# Patient Record
Sex: Female | Born: 1950 | Race: White | Hispanic: No | Marital: Single | Smoking: Current every day smoker
Health system: Southern US, Community
[De-identification: ages and names within clinical notes are randomized; demographics above are authoritative.]

## PROBLEM LIST (undated history)

## (undated) DIAGNOSIS — Z923 Personal history of irradiation: Secondary | ICD-10-CM

## (undated) DIAGNOSIS — G939 Disorder of brain, unspecified: Secondary | ICD-10-CM

## (undated) DIAGNOSIS — R1310 Dysphagia, unspecified: Secondary | ICD-10-CM

## (undated) DIAGNOSIS — R079 Chest pain, unspecified: Secondary | ICD-10-CM

## (undated) DIAGNOSIS — C3490 Malignant neoplasm of unspecified part of unspecified bronchus or lung: Principal | ICD-10-CM

## (undated) DIAGNOSIS — D649 Anemia, unspecified: Secondary | ICD-10-CM

## (undated) DIAGNOSIS — J449 Chronic obstructive pulmonary disease, unspecified: Secondary | ICD-10-CM

## (undated) DIAGNOSIS — R911 Solitary pulmonary nodule: Secondary | ICD-10-CM

## (undated) DIAGNOSIS — R0602 Shortness of breath: Secondary | ICD-10-CM

## (undated) DIAGNOSIS — C7931 Secondary malignant neoplasm of brain: Secondary | ICD-10-CM

## (undated) DIAGNOSIS — N823 Fistula of vagina to large intestine: Secondary | ICD-10-CM

## (undated) HISTORY — DX: Anemia, unspecified: D64.9

## (undated) HISTORY — PX: EYE SURGERY: SHX253

## (undated) HISTORY — PX: PARTIAL HYSTERECTOMY: SHX80

## (undated) HISTORY — DX: Disorder of brain, unspecified: G93.9

## (undated) HISTORY — DX: Solitary pulmonary nodule: R91.1

## (undated) HISTORY — DX: Fistula of vagina to large intestine: N82.3

## (undated) HISTORY — PX: OTHER SURGICAL HISTORY: SHX169

## (undated) HISTORY — PX: TUBAL LIGATION: SHX77

## (undated) HISTORY — PX: NECK SURGERY: SHX720

## (undated) HISTORY — PX: RETINAL DETACHMENT SURGERY: SHX105

---

## 1986-03-05 HISTORY — PX: ABDOMINAL HYSTERECTOMY: SHX81

## 1999-07-17 ENCOUNTER — Encounter: Admission: RE | Admit: 1999-07-17 | Discharge: 1999-10-15 | Payer: Self-pay | Admitting: Family Medicine

## 1999-08-23 ENCOUNTER — Encounter: Payer: Self-pay | Admitting: Neurosurgery

## 1999-08-23 ENCOUNTER — Ambulatory Visit (HOSPITAL_COMMUNITY): Admission: RE | Admit: 1999-08-23 | Discharge: 1999-08-23 | Payer: Self-pay | Admitting: Neurosurgery

## 1999-09-01 ENCOUNTER — Encounter: Payer: Self-pay | Admitting: Neurosurgery

## 1999-09-01 ENCOUNTER — Observation Stay (HOSPITAL_COMMUNITY): Admission: RE | Admit: 1999-09-01 | Discharge: 1999-09-02 | Payer: Self-pay | Admitting: Neurosurgery

## 1999-09-14 ENCOUNTER — Encounter: Payer: Self-pay | Admitting: Neurosurgery

## 1999-09-14 ENCOUNTER — Encounter: Admission: RE | Admit: 1999-09-14 | Discharge: 1999-09-14 | Payer: Self-pay | Admitting: Neurosurgery

## 1999-10-30 ENCOUNTER — Encounter: Admission: RE | Admit: 1999-10-30 | Discharge: 1999-10-30 | Payer: Self-pay | Admitting: Neurosurgery

## 1999-10-30 ENCOUNTER — Encounter: Payer: Self-pay | Admitting: Neurosurgery

## 2000-09-02 ENCOUNTER — Ambulatory Visit (HOSPITAL_COMMUNITY): Admission: RE | Admit: 2000-09-02 | Discharge: 2000-09-02 | Payer: Self-pay | Admitting: Obstetrics and Gynecology

## 2000-09-02 ENCOUNTER — Encounter: Payer: Self-pay | Admitting: Obstetrics and Gynecology

## 2001-11-28 ENCOUNTER — Ambulatory Visit (HOSPITAL_COMMUNITY): Admission: RE | Admit: 2001-11-28 | Discharge: 2001-11-28 | Payer: Self-pay | Admitting: General Surgery

## 2001-11-28 ENCOUNTER — Encounter: Payer: Self-pay | Admitting: General Surgery

## 2002-07-31 ENCOUNTER — Encounter: Payer: Self-pay | Admitting: Family Medicine

## 2002-07-31 ENCOUNTER — Ambulatory Visit (HOSPITAL_COMMUNITY): Admission: RE | Admit: 2002-07-31 | Discharge: 2002-07-31 | Payer: Self-pay | Admitting: Family Medicine

## 2003-03-08 ENCOUNTER — Ambulatory Visit (HOSPITAL_COMMUNITY): Admission: RE | Admit: 2003-03-08 | Discharge: 2003-03-08 | Payer: Self-pay | Admitting: Family Medicine

## 2004-03-21 ENCOUNTER — Ambulatory Visit (HOSPITAL_COMMUNITY): Admission: RE | Admit: 2004-03-21 | Discharge: 2004-03-21 | Payer: Self-pay | Admitting: Family Medicine

## 2005-04-10 ENCOUNTER — Ambulatory Visit (HOSPITAL_COMMUNITY): Admission: RE | Admit: 2005-04-10 | Discharge: 2005-04-10 | Payer: Self-pay | Admitting: Family Medicine

## 2006-04-29 ENCOUNTER — Ambulatory Visit (HOSPITAL_COMMUNITY): Admission: RE | Admit: 2006-04-29 | Discharge: 2006-04-29 | Payer: Self-pay | Admitting: Family Medicine

## 2007-05-02 ENCOUNTER — Ambulatory Visit (HOSPITAL_COMMUNITY): Admission: RE | Admit: 2007-05-02 | Discharge: 2007-05-02 | Payer: Self-pay | Admitting: Family Medicine

## 2008-05-03 ENCOUNTER — Ambulatory Visit (HOSPITAL_COMMUNITY): Admission: RE | Admit: 2008-05-03 | Discharge: 2008-05-03 | Payer: Self-pay | Admitting: Family Medicine

## 2009-05-19 ENCOUNTER — Ambulatory Visit (HOSPITAL_COMMUNITY): Admission: RE | Admit: 2009-05-19 | Discharge: 2009-05-19 | Payer: Self-pay | Admitting: Family Medicine

## 2010-02-02 ENCOUNTER — Ambulatory Visit (HOSPITAL_COMMUNITY)
Admission: RE | Admit: 2010-02-02 | Discharge: 2010-02-02 | Payer: Self-pay | Source: Home / Self Care | Admitting: Surgery

## 2010-05-16 LAB — CBC
HCT: 40.2 % (ref 36.0–46.0)
MCV: 92.4 fL (ref 78.0–100.0)
Platelets: 294 10*3/uL (ref 150–400)
RBC: 4.35 MIL/uL (ref 3.87–5.11)
RDW: 12.5 % (ref 11.5–15.5)
WBC: 9 10*3/uL (ref 4.0–10.5)

## 2010-05-16 LAB — SURGICAL PCR SCREEN

## 2010-06-12 ENCOUNTER — Other Ambulatory Visit (HOSPITAL_COMMUNITY): Payer: Self-pay | Admitting: Family Medicine

## 2010-06-12 DIAGNOSIS — Z139 Encounter for screening, unspecified: Secondary | ICD-10-CM

## 2010-06-22 ENCOUNTER — Ambulatory Visit (HOSPITAL_COMMUNITY)
Admission: RE | Admit: 2010-06-22 | Discharge: 2010-06-22 | Disposition: A | Discharge status: Home / Self Care | Payer: PRIVATE HEALTH INSURANCE | Source: Ambulatory Visit | Attending: Family Medicine | Admitting: Family Medicine

## 2010-06-22 DIAGNOSIS — Z1231 Encounter for screening mammogram for malignant neoplasm of breast: Secondary | ICD-10-CM | POA: Insufficient documentation

## 2010-06-22 DIAGNOSIS — Z139 Encounter for screening, unspecified: Secondary | ICD-10-CM

## 2010-07-21 NOTE — Op Note (Signed)
[address]. [facility]  Patient:    Melanie Liu, Melanie Liu                        MRN: [id number] Proc. Date: 09/01/99 Adm. 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  PROCEDURE:  Anterior C6-7 diskectomy, bone bank graft, Synthes plate, microscope, Midas-Rex.  SURGEON:  Tanya Nones. Jeral Fruit, M.D.  ASSISTANTMena Goes. Franky Macho, M.D.  INDICATIONS:  The patient was admitted because of neck and left upper extremity  pain associated with atrophy of the left tricep.  MRI showed mild spondylosis at the level of 5-6, but a disk central and to the left at the level of C6-7. Surgery was advised.  She knew all the risks such as infection, CSF leak, worsening of he pain, paralysis, damage to the vocal cords, damage to throat, and failure of the plate.  The patient was scheduled to have surgery last week, but we found that ad the possibility of MI.  Cardiological workup was negative.  DESCRIPTION OF PROCEDURE:  The patient was taken to the OR and after intubation, the left side of the neck was prepped with Betadine.  A transverse incision through the skin and platysma was carried out.  X-ray showed that indeed we were at the  level of the C6-7.  We brought the microscope into the area and we opened anterior lip doing proper gross diskectomy.  Then with the 1 and 2 mm Kerrison punch, we did a foraminotomy.  We opened the posterior ligament and we found at least 3 or 4 fragments of disk going into the left C7 nerve root.  The nerve was swollen and  reddish.  Decompression was achieved.  At the end with the Midas-Rex we drilled the endplates of C6-7 and a 6 mm bone graft was inserted.  Then a Synthes  plate using four screws was inserted.  Inspection of the trachea and carotid was negative.  Then the area was irrigated and lateral C-spine showed good position of the graft and the plate and the wound was closed with Vicryl and a Steri-Strip. The patient did well. DD:  09/01/99 TD:  09/02/99 Job: [id number]

## 2010-07-21 NOTE — H&P (Signed)
[place]. [facility]  Patient:    Melanie Liu, Melanie Liu                          MRN: [id number] Adm. Date:  09/01/99 Attending:  Tanya Nones. Jeral Fruit, M.D.                         History and Physical  HISTORY OF PRESENT ILLNESS:  Melanie Liu is a lady who was seen by me ten days ago in my office because of neck pain with radiation to the left upper extremity associated with numbness and tingling sensation.  The patient had failed with conservative treatment and the MRI showed a large herniated disc at the level of C6-C7.  We were ready to take this lady to surgery last week but the EKG showed the possibility of an MI.  She had a cardiology evaluation which was negative and she is being admitted today for surgery.  PAST MEDICAL HISTORY:  Hysterectomy.  ALLERGIES:  She is not allergic to any medications.  She smokes a pack a day and she had been doing that for 13 years.  Does not drink.  FAMILY HISTORY:  Father died at the age of 78 with a heart attack.  REVIEW OF SYSTEMS:  Except for the heavy history of smoking and neck and left upper extremity pain, the rest is negative.  PHYSICAL EXAMINATION:  GENERAL:  A patient who came to my office and she was holding the left arm against the chest wall.  HEENT:  Normal.  NECK:  There are no bruits. She is able to flex but extension causes pain going to the left shoulder.  LUNGS:  Rhonchi bilaterally.  HEART:  Sounds normal.  ABDOMEN:  Normal.  EXTREMITIES: Normal pulses.  NEUROLOGIC:  Mental status is normal.  Cranial nerves normal. Strength: On the left side she has a normal biceps, deltoid and normal wrist. She has 2/5 weakness of the left triceps with atrophy. She also has weakness of the hypothenar muscle.  Sensation seems to be normal, although she is complaining of tingling sensation which involved the 6 and 7 dermatome. Reflexes are 2+ with absence of the left triceps.  MRI showed that she has mild  degenerative changes at the level of 5-6 and 6-7. The MRI showed that she has a large herniated disc central and to the left at the level of C6 and 7.  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 Mild cervical spondylosis 5-6.  RECOMMENDATIONS:  The patient is being admitted for one level anterior cervical diskectomy.  She knows about the risks such as infection, CSF leak, worsening or pain, paralysis, damage to vocal cords, damage to the esophagus, infection and failure of the system. DD:  09/01/99 TD:  09/01/99 Job: [id number]

## 2011-03-21 ENCOUNTER — Encounter (INDEPENDENT_AMBULATORY_CARE_PROVIDER_SITE_OTHER): Payer: PRIVATE HEALTH INSURANCE | Admitting: Ophthalmology

## 2011-03-21 DIAGNOSIS — H353290 Exudative age-related macular degeneration, unspecified eye, stage unspecified: Secondary | ICD-10-CM

## 2011-03-21 DIAGNOSIS — H5210 Myopia, unspecified eye: Secondary | ICD-10-CM

## 2011-03-21 DIAGNOSIS — H43819 Vitreous degeneration, unspecified eye: Secondary | ICD-10-CM

## 2011-03-21 DIAGNOSIS — H3530 Unspecified macular degeneration: Secondary | ICD-10-CM

## 2011-03-30 ENCOUNTER — Ambulatory Visit (INDEPENDENT_AMBULATORY_CARE_PROVIDER_SITE_OTHER): Payer: PRIVATE HEALTH INSURANCE | Admitting: Ophthalmology

## 2011-03-30 DIAGNOSIS — H4420 Degenerative myopia, unspecified eye: Secondary | ICD-10-CM

## 2011-03-30 DIAGNOSIS — H3530 Unspecified macular degeneration: Secondary | ICD-10-CM

## 2011-03-30 DIAGNOSIS — H43819 Vitreous degeneration, unspecified eye: Secondary | ICD-10-CM

## 2011-03-30 DIAGNOSIS — H353290 Exudative age-related macular degeneration, unspecified eye, stage unspecified: Secondary | ICD-10-CM

## 2011-03-30 DIAGNOSIS — H2510 Age-related nuclear cataract, unspecified eye: Secondary | ICD-10-CM

## 2011-04-18 ENCOUNTER — Encounter (INDEPENDENT_AMBULATORY_CARE_PROVIDER_SITE_OTHER): Payer: PRIVATE HEALTH INSURANCE | Admitting: Ophthalmology

## 2011-04-18 DIAGNOSIS — H43819 Vitreous degeneration, unspecified eye: Secondary | ICD-10-CM

## 2011-04-18 DIAGNOSIS — H2510 Age-related nuclear cataract, unspecified eye: Secondary | ICD-10-CM

## 2011-04-18 DIAGNOSIS — H353290 Exudative age-related macular degeneration, unspecified eye, stage unspecified: Secondary | ICD-10-CM

## 2011-04-18 DIAGNOSIS — H33009 Unspecified retinal detachment with retinal break, unspecified eye: Secondary | ICD-10-CM

## 2011-04-18 DIAGNOSIS — H3530 Unspecified macular degeneration: Secondary | ICD-10-CM

## 2011-05-15 ENCOUNTER — Encounter (INDEPENDENT_AMBULATORY_CARE_PROVIDER_SITE_OTHER): Payer: PRIVATE HEALTH INSURANCE | Admitting: Ophthalmology

## 2011-05-15 DIAGNOSIS — H3530 Unspecified macular degeneration: Secondary | ICD-10-CM

## 2011-05-15 DIAGNOSIS — H353290 Exudative age-related macular degeneration, unspecified eye, stage unspecified: Secondary | ICD-10-CM

## 2011-05-15 DIAGNOSIS — H2510 Age-related nuclear cataract, unspecified eye: Secondary | ICD-10-CM

## 2011-05-15 DIAGNOSIS — H43819 Vitreous degeneration, unspecified eye: Secondary | ICD-10-CM

## 2011-06-13 ENCOUNTER — Encounter (INDEPENDENT_AMBULATORY_CARE_PROVIDER_SITE_OTHER): Payer: PRIVATE HEALTH INSURANCE | Admitting: Ophthalmology

## 2011-06-13 DIAGNOSIS — H2510 Age-related nuclear cataract, unspecified eye: Secondary | ICD-10-CM

## 2011-06-13 DIAGNOSIS — H353290 Exudative age-related macular degeneration, unspecified eye, stage unspecified: Secondary | ICD-10-CM

## 2011-06-13 DIAGNOSIS — H43819 Vitreous degeneration, unspecified eye: Secondary | ICD-10-CM

## 2011-06-13 DIAGNOSIS — H3530 Unspecified macular degeneration: Secondary | ICD-10-CM

## 2011-06-14 ENCOUNTER — Other Ambulatory Visit (HOSPITAL_COMMUNITY): Payer: Self-pay | Admitting: Family Medicine

## 2011-06-14 DIAGNOSIS — Z139 Encounter for screening, unspecified: Secondary | ICD-10-CM

## 2011-06-25 ENCOUNTER — Ambulatory Visit (HOSPITAL_COMMUNITY)
Admission: RE | Admit: 2011-06-25 | Discharge: 2011-06-25 | Disposition: A | Discharge status: Home / Self Care | Payer: PRIVATE HEALTH INSURANCE | Source: Ambulatory Visit | Attending: Family Medicine | Admitting: Family Medicine

## 2011-06-25 DIAGNOSIS — Z139 Encounter for screening, unspecified: Secondary | ICD-10-CM

## 2011-06-25 DIAGNOSIS — Z1231 Encounter for screening mammogram for malignant neoplasm of breast: Secondary | ICD-10-CM | POA: Insufficient documentation

## 2011-07-11 ENCOUNTER — Encounter (INDEPENDENT_AMBULATORY_CARE_PROVIDER_SITE_OTHER): Payer: PRIVATE HEALTH INSURANCE | Admitting: Ophthalmology

## 2011-07-11 DIAGNOSIS — H353290 Exudative age-related macular degeneration, unspecified eye, stage unspecified: Secondary | ICD-10-CM

## 2011-07-11 DIAGNOSIS — H33009 Unspecified retinal detachment with retinal break, unspecified eye: Secondary | ICD-10-CM

## 2011-07-11 DIAGNOSIS — H3530 Unspecified macular degeneration: Secondary | ICD-10-CM

## 2011-07-11 DIAGNOSIS — H43819 Vitreous degeneration, unspecified eye: Secondary | ICD-10-CM

## 2011-07-11 DIAGNOSIS — H2510 Age-related nuclear cataract, unspecified eye: Secondary | ICD-10-CM

## 2011-08-08 ENCOUNTER — Encounter (INDEPENDENT_AMBULATORY_CARE_PROVIDER_SITE_OTHER): Payer: PRIVATE HEALTH INSURANCE | Admitting: Ophthalmology

## 2011-08-08 DIAGNOSIS — H43819 Vitreous degeneration, unspecified eye: Secondary | ICD-10-CM

## 2011-08-08 DIAGNOSIS — H2510 Age-related nuclear cataract, unspecified eye: Secondary | ICD-10-CM

## 2011-08-08 DIAGNOSIS — H3530 Unspecified macular degeneration: Secondary | ICD-10-CM

## 2011-08-08 DIAGNOSIS — H353290 Exudative age-related macular degeneration, unspecified eye, stage unspecified: Secondary | ICD-10-CM

## 2011-08-08 DIAGNOSIS — H33009 Unspecified retinal detachment with retinal break, unspecified eye: Secondary | ICD-10-CM

## 2011-09-05 ENCOUNTER — Encounter (INDEPENDENT_AMBULATORY_CARE_PROVIDER_SITE_OTHER): Payer: PRIVATE HEALTH INSURANCE | Admitting: Ophthalmology

## 2011-09-05 DIAGNOSIS — H2510 Age-related nuclear cataract, unspecified eye: Secondary | ICD-10-CM

## 2011-09-05 DIAGNOSIS — H3530 Unspecified macular degeneration: Secondary | ICD-10-CM

## 2011-09-05 DIAGNOSIS — H43819 Vitreous degeneration, unspecified eye: Secondary | ICD-10-CM

## 2011-09-05 DIAGNOSIS — H353290 Exudative age-related macular degeneration, unspecified eye, stage unspecified: Secondary | ICD-10-CM

## 2011-09-05 DIAGNOSIS — H33009 Unspecified retinal detachment with retinal break, unspecified eye: Secondary | ICD-10-CM

## 2011-10-05 ENCOUNTER — Encounter (INDEPENDENT_AMBULATORY_CARE_PROVIDER_SITE_OTHER): Payer: PRIVATE HEALTH INSURANCE | Admitting: Ophthalmology

## 2011-10-08 ENCOUNTER — Encounter (INDEPENDENT_AMBULATORY_CARE_PROVIDER_SITE_OTHER): Payer: PRIVATE HEALTH INSURANCE | Admitting: Ophthalmology

## 2011-10-08 DIAGNOSIS — H353290 Exudative age-related macular degeneration, unspecified eye, stage unspecified: Secondary | ICD-10-CM

## 2011-10-08 DIAGNOSIS — H43819 Vitreous degeneration, unspecified eye: Secondary | ICD-10-CM

## 2011-10-08 DIAGNOSIS — H3530 Unspecified macular degeneration: Secondary | ICD-10-CM

## 2011-10-08 DIAGNOSIS — H33009 Unspecified retinal detachment with retinal break, unspecified eye: Secondary | ICD-10-CM

## 2011-11-06 ENCOUNTER — Other Ambulatory Visit (HOSPITAL_COMMUNITY): Payer: Self-pay | Admitting: Family Medicine

## 2011-11-06 ENCOUNTER — Ambulatory Visit (HOSPITAL_COMMUNITY)
Admission: RE | Admit: 2011-11-06 | Discharge: 2011-11-06 | Disposition: A | Discharge status: Home / Self Care | Payer: PRIVATE HEALTH INSURANCE | Source: Ambulatory Visit | Attending: Family Medicine | Admitting: Family Medicine

## 2011-11-06 DIAGNOSIS — R634 Abnormal weight loss: Secondary | ICD-10-CM

## 2011-11-06 DIAGNOSIS — J438 Other emphysema: Secondary | ICD-10-CM | POA: Insufficient documentation

## 2011-11-06 DIAGNOSIS — F17200 Nicotine dependence, unspecified, uncomplicated: Secondary | ICD-10-CM

## 2011-11-06 DIAGNOSIS — C3490 Malignant neoplasm of unspecified part of unspecified bronchus or lung: Secondary | ICD-10-CM | POA: Insufficient documentation

## 2011-11-07 ENCOUNTER — Other Ambulatory Visit (HOSPITAL_COMMUNITY): Payer: Self-pay | Admitting: Family Medicine

## 2011-11-07 ENCOUNTER — Ambulatory Visit (HOSPITAL_COMMUNITY)
Admission: RE | Admit: 2011-11-07 | Discharge: 2011-11-07 | Disposition: A | Discharge status: Home / Self Care | Payer: PRIVATE HEALTH INSURANCE | Source: Ambulatory Visit | Attending: Family Medicine | Admitting: Family Medicine

## 2011-11-07 DIAGNOSIS — R918 Other nonspecific abnormal finding of lung field: Secondary | ICD-10-CM | POA: Insufficient documentation

## 2011-11-07 DIAGNOSIS — R222 Localized swelling, mass and lump, trunk: Secondary | ICD-10-CM | POA: Insufficient documentation

## 2011-11-07 MED ORDER — IOHEXOL 300 MG/ML  SOLN
80.0000 mL | Freq: Once | INTRAMUSCULAR | Status: AC | PRN
  Administered 2011-11-07: 80 mL via INTRAVENOUS

## 2011-11-08 ENCOUNTER — Encounter (INDEPENDENT_AMBULATORY_CARE_PROVIDER_SITE_OTHER): Payer: PRIVATE HEALTH INSURANCE | Admitting: Ophthalmology

## 2011-11-08 DIAGNOSIS — H43819 Vitreous degeneration, unspecified eye: Secondary | ICD-10-CM

## 2011-11-08 DIAGNOSIS — H2510 Age-related nuclear cataract, unspecified eye: Secondary | ICD-10-CM

## 2011-11-08 DIAGNOSIS — H3530 Unspecified macular degeneration: Secondary | ICD-10-CM

## 2011-11-08 DIAGNOSIS — H33009 Unspecified retinal detachment with retinal break, unspecified eye: Secondary | ICD-10-CM

## 2011-11-08 DIAGNOSIS — H353290 Exudative age-related macular degeneration, unspecified eye, stage unspecified: Secondary | ICD-10-CM

## 2011-11-14 ENCOUNTER — Encounter (HOSPITAL_COMMUNITY): Payer: Self-pay

## 2011-11-14 ENCOUNTER — Encounter (HOSPITAL_COMMUNITY)
Admission: RE | Admit: 2011-11-14 | Discharge: 2011-11-14 | Disposition: A | Discharge status: Home / Self Care | Payer: PRIVATE HEALTH INSURANCE | Source: Ambulatory Visit | Attending: Family Medicine | Admitting: Family Medicine

## 2011-11-14 DIAGNOSIS — R918 Other nonspecific abnormal finding of lung field: Secondary | ICD-10-CM

## 2011-11-14 DIAGNOSIS — R222 Localized swelling, mass and lump, trunk: Secondary | ICD-10-CM | POA: Insufficient documentation

## 2011-11-14 MED ORDER — FLUDEOXYGLUCOSE F - 18 (FDG) INJECTION
17.0000 | Freq: Once | INTRAVENOUS | Status: AC | PRN
  Administered 2011-11-14: 17 via INTRAVENOUS

## 2011-11-15 ENCOUNTER — Institutional Professional Consult (permissible substitution) (INDEPENDENT_AMBULATORY_CARE_PROVIDER_SITE_OTHER): Payer: PRIVATE HEALTH INSURANCE | Admitting: Cardiothoracic Surgery

## 2011-11-15 ENCOUNTER — Other Ambulatory Visit: Payer: Self-pay | Admitting: Cardiothoracic Surgery

## 2011-11-15 ENCOUNTER — Encounter: Payer: Self-pay | Admitting: *Deleted

## 2011-11-15 VITALS — BP 92/60 | HR 94 | Resp 16 | Ht 62.0 in | Wt 107.0 lb

## 2011-11-15 DIAGNOSIS — R918 Other nonspecific abnormal finding of lung field: Secondary | ICD-10-CM | POA: Insufficient documentation

## 2011-11-15 DIAGNOSIS — D381 Neoplasm of uncertain behavior of trachea, bronchus and lung: Secondary | ICD-10-CM

## 2011-11-15 DIAGNOSIS — R222 Localized swelling, mass and lump, trunk: Secondary | ICD-10-CM

## 2011-11-15 DIAGNOSIS — F17210 Nicotine dependence, cigarettes, uncomplicated: Secondary | ICD-10-CM

## 2011-11-15 NOTE — Patient Instructions (Signed)
Stop smoking He may continue to work but did not miss your appointments for your tests and biopsy

## 2011-11-15 NOTE — Progress Notes (Signed)
PCP is [name], MD Referring Provider is [name],*                    [address] [place]            [place] [address]          [phone]      Chief Complaint  Patient presents with  . Lung Mass    RUL MASS CT CHEST 11/07/11    HPI: 61 year old Caucasian female smoker with a recently diagnosed large mass of the right upper lobe measuring 9 cm with extension into the upper mediastinum. She presented with right parascapular back pain, weakness and a 14 pound weight loss as well as night sweats. Chest x-ray showed a mass. A followup CT scan showed the mass to be 9 cm in length, paraspinal location without significant mediastinal adenopathy. CAT scan showed the mass to be hypermetabolic SUV of 30-0.0. The hilum had hypermetabolic activity SUV of 19. The right paratracheal lymph node measured SUV 3.4 no abdominal metastatic disease no other pulmonary masses noted on PET scan. A head CT scan is pending to complete clinical staging.  The patient lives alone. 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 She still smokes one pack per day. There's no family history lung cancer.  Past Medical History  Diagnosis Date  . Nodule of right lung     right upper lobe  . Anemia   . Rectovaginal fistula     Past Surgical History  Procedure Date  . Partial hysterectomy   . Hemmorhoid surgery   . Neck surgery   . Retinal detachment surgery     Family History  Problem Relation Age of Onset  . Heart disease Father   . Kidney disease Mother   . Alzheimer's disease Sister   . Alzheimer's disease Brother     Social History History  Substance Use Topics  . Smoking status: Current Every Day Smoker -- 1.0 packs/day for 43 years    Types: Cigarettes  . Smokeless tobacco: Never Used  . Alcohol Use: No    Current Outpatient Prescriptions  Medication Sig Dispense Refill  . calcium gluconate 500 MG tablet Take 500 mg by mouth daily.      .  cholecalciferol (VITAMIN D) 1000 UNITS tablet Take 1,000 Units by mouth daily.      . ferrous fumarate (HEMOCYTE - 106 MG FE) 325 (106 FE) MG TABS Take 1 tablet by mouth 2 (two) times daily.      [name] glucosamine-chondroitin 500-400 MG tablet Take 1 tablet by mouth 3 (three) times daily.        Allergies  Allergen Reactions  . Levaquin (Levofloxacin In D5w)     Review of Systems: Positive for weight loss night sweats negative for fever. No hemoptysis. No history thoracic trauma. Being treated by Dr. Ashley Royalty for detached retina receiving Avastin injection into the left eye on a monthly basis Patient had cervical fusion in the past without complication. Patient is status post abdominal hysterectomy and has a single gallstone. No history of hernia disease, angina, cardiac murmur No history DVT claudication TIA No headache change in balance or false No diabetes no bleeding disorder-  BP 92/60  Pulse 94  Resp 16  Ht 5\' 2"  (1.575 m)  Wt 107 lb (48.535 kg)  BMI 19.57 kg/m2  SpO2 98% Physical Exam: Gen.-Middle-aged Caucasian female appears thin and anxious company by Sr. HEENT pupils equal  dentition adequate Neck-no JVD or mass or carotid bruit Lymphatics-no palpable nodes in the neck Thorax-no deformity or tenderness breath sounds clear and distant, mild shortness of breath with exertion upstairs Cardiac-regular rhythm no murmur rub or gallop Abdomen-soft, scaphoid, no palpable mass Extremities-mild clubbing no cyanosis edema or tenderness Neuro-normal gait no focal motor deficit  Diagnostic Tests: Chest x-ray, CT chest, PET scan reviewed with patient  Impression: Clinical stage III cancer with evidence of hilar or mediastinal invasion of a large right upper lobe mass    Plan-finish clinical staging with a head CT scan-there is a plate in her neck from the spinal fusion           Established pathologic diagnosis with a transthoracic needle biopsy           Return to M.TOC after  above studies to discuss treatment with oncology and radiation oncology

## 2011-11-15 NOTE — Progress Notes (Signed)
Spoke with pt and sister at [facility] today.  Information regarding smoking cessation given and explained to pt.  Questions and concerns addressed

## 2011-11-19 ENCOUNTER — Other Ambulatory Visit: Payer: Self-pay | Admitting: Radiology

## 2011-11-20 ENCOUNTER — Other Ambulatory Visit: Payer: Self-pay | Admitting: Radiology

## 2011-11-20 ENCOUNTER — Encounter (HOSPITAL_COMMUNITY): Payer: Self-pay | Admitting: Pharmacy Technician

## 2011-11-21 ENCOUNTER — Other Ambulatory Visit: Payer: Self-pay | Admitting: Radiology

## 2011-11-21 NOTE — Progress Notes (Signed)
This encounter was created in error - please disregard.

## 2011-11-23 ENCOUNTER — Ambulatory Visit (HOSPITAL_COMMUNITY): Admission: RE | Admit: 2011-11-23 | Payer: PRIVATE HEALTH INSURANCE | Source: Ambulatory Visit

## 2011-11-23 ENCOUNTER — Ambulatory Visit (HOSPITAL_COMMUNITY)
Admission: RE | Admit: 2011-11-23 | Discharge: 2011-11-23 | Disposition: A | Discharge status: Home / Self Care | Payer: PRIVATE HEALTH INSURANCE | Source: Ambulatory Visit | Attending: Cardiothoracic Surgery | Admitting: Cardiothoracic Surgery

## 2011-11-23 ENCOUNTER — Encounter (HOSPITAL_COMMUNITY): Payer: Self-pay

## 2011-11-23 ENCOUNTER — Ambulatory Visit (HOSPITAL_COMMUNITY)
Admission: RE | Admit: 2011-11-23 | Discharge: 2011-11-23 | Disposition: A | Discharge status: Home / Self Care | Payer: PRIVATE HEALTH INSURANCE | Source: Ambulatory Visit | Attending: Interventional Radiology | Admitting: Interventional Radiology

## 2011-11-23 DIAGNOSIS — D381 Neoplasm of uncertain behavior of trachea, bronchus and lung: Secondary | ICD-10-CM | POA: Insufficient documentation

## 2011-11-23 DIAGNOSIS — C3490 Malignant neoplasm of unspecified part of unspecified bronchus or lung: Secondary | ICD-10-CM

## 2011-11-23 DIAGNOSIS — J984 Other disorders of lung: Secondary | ICD-10-CM | POA: Insufficient documentation

## 2011-11-23 HISTORY — DX: Malignant neoplasm of unspecified part of unspecified bronchus or lung: C34.90

## 2011-11-23 LAB — PROTIME-INR: Prothrombin Time: 13.4 seconds (ref 11.6–15.2)

## 2011-11-23 LAB — CBC
Hemoglobin: 11.3 g/dL — ABNORMAL LOW (ref 12.0–15.0)
Platelets: 496 10*3/uL — ABNORMAL HIGH (ref 150–400)
RBC: 3.97 MIL/uL (ref 3.87–5.11)
WBC: 17.4 10*3/uL — ABNORMAL HIGH (ref 4.0–10.5)

## 2011-11-23 MED ORDER — MIDAZOLAM HCL 5 MG/5ML IJ SOLN
INTRAMUSCULAR | Status: AC | PRN
  Administered 2011-11-23: 1 mg via INTRAVENOUS
  Administered 2011-11-23: 0.5 mg via INTRAVENOUS

## 2011-11-23 MED ORDER — FENTANYL CITRATE 0.05 MG/ML IJ SOLN
INTRAMUSCULAR | Status: AC
  Filled 2011-11-23: qty 4

## 2011-11-23 MED ORDER — FENTANYL CITRATE 0.05 MG/ML IJ SOLN
INTRAMUSCULAR | Status: AC | PRN
  Administered 2011-11-23: 25 ug via INTRAVENOUS

## 2011-11-23 MED ORDER — SODIUM CHLORIDE 0.9 % IV SOLN
INTRAVENOUS | Status: DC
  Administered 2011-11-23: 07:00:00 via INTRAVENOUS

## 2011-11-23 MED ORDER — MIDAZOLAM HCL 2 MG/2ML IJ SOLN
INTRAMUSCULAR | Status: AC
  Filled 2011-11-23: qty 4

## 2011-11-23 NOTE — ED Notes (Signed)
MD at bedside. 

## 2011-11-23 NOTE — ED Notes (Signed)
O2 2L/[place] started 

## 2011-11-23 NOTE — ED Notes (Signed)
O2 d/c'd 

## 2011-11-23 NOTE — H&P (Signed)
Melanie Liu is an 61 y.o. female.   Chief Complaint: Chest and back pain x few weeks Wt loss; night sweats; SOB CT reveals R lung mass; +PET Scheduled for lung mass biopsy per Dr Morton Peters HPI: lung mass; anemia; +smoker  Past Medical History  Diagnosis Date  . Nodule of right lung     right upper lobe  . Anemia   . Rectovaginal fistula     Past Surgical History  Procedure Date  . Partial hysterectomy   . Hemmorhoid surgery   . Neck surgery   . Retinal detachment surgery     Family History  Problem Relation Age of Onset  . Heart disease Father   . Kidney disease Mother   . Alzheimer's disease Sister   . Alzheimer's disease Brother    Social History:  reports that she has been smoking Cigarettes.  She has a 43 pack-year smoking history. She has never used smokeless tobacco. She reports that she does not drink alcohol or use illicit drugs.  Allergies:  Allergies  Allergen Reactions  . Levaquin (Levofloxacin In D5w)      (Not in a hospital admission)  Results for orders placed during the hospital encounter of 11/23/11 (from the past 48 hour(s))  APTT     Status: Normal   Collection Time   11/23/11  7:17 AM      Component Value Range Comment   aPTT 35  24 - 37 seconds   CBC     Status: Abnormal   Collection Time   11/23/11  7:17 AM      Component Value Range Comment   WBC 17.4 (*) 4.0 - 10.5 K/uL    RBC 3.97  3.87 - 5.11 MIL/uL    Hemoglobin 11.3 (*) 12.0 - 15.0 g/dL    HCT 04.5 (*) 40.9 - 46.0 %    MCV 87.7  78.0 - 100.0 fL    MCH 28.5  26.0 - 34.0 pg    MCHC 32.5  30.0 - 36.0 g/dL    RDW 81.1  91.4 - 78.2 %    Platelets 496 (*) 150 - 400 K/uL   PROTIME-INR     Status: Normal   Collection Time   11/23/11  7:17 AM      Component Value Range Comment   Prothrombin Time 13.4  11.6 - 15.2 seconds    INR 1.03  0.00 - 1.49    No results found.  Review of Systems  Constitutional: Positive for weight loss. Negative for fever.  Respiratory: Positive for  shortness of breath.   Cardiovascular: Positive for chest pain.  Gastrointestinal: Negative for nausea, vomiting and abdominal pain.  Musculoskeletal: Positive for back pain.  Neurological: Positive for weakness. Negative for headaches.    Blood pressure 108/71, pulse 68, temperature 96.5 F (35.8 C), temperature source Oral, resp. rate 18, height 5\' 2"  (1.575 m), weight 107 lb (48.535 kg), SpO2 94.00%. Physical Exam  Constitutional: She is oriented to person, place, and time.  Cardiovascular: Normal rate, regular rhythm and normal heart sounds.   No murmur heard. Respiratory: Effort normal. She has wheezes.  GI: Soft. Bowel sounds are normal. There is no tenderness.  Musculoskeletal: Normal range of motion.  Neurological: She is alert and oriented to person, place, and time.  Skin: Skin is warm and dry.  Psychiatric: She has a normal mood and affect. Her behavior is normal. Judgment and thought content normal.     Assessment/Plan Chest and back pain; SOB;  wt loss +smoker CT and +PET Rt lung mass Scheduled for lung mass biopsy Pt aware of procedure benefits and risks and agreeable to proceed. Consent signed and in chart  Vivek Grealish A 11/23/2011, 8:07 AM

## 2011-11-23 NOTE — Procedures (Signed)
Technically successful CT guided biopsy of left upper lobe mass. No immediate complications. Awaiting pathology report.

## 2011-12-05 ENCOUNTER — Ambulatory Visit: Payer: PRIVATE HEALTH INSURANCE | Admitting: Cardiothoracic Surgery

## 2011-12-06 ENCOUNTER — Encounter (INDEPENDENT_AMBULATORY_CARE_PROVIDER_SITE_OTHER): Payer: PRIVATE HEALTH INSURANCE | Admitting: Ophthalmology

## 2011-12-06 DIAGNOSIS — H353290 Exudative age-related macular degeneration, unspecified eye, stage unspecified: Secondary | ICD-10-CM

## 2011-12-06 DIAGNOSIS — H43819 Vitreous degeneration, unspecified eye: Secondary | ICD-10-CM

## 2011-12-06 DIAGNOSIS — H3530 Unspecified macular degeneration: Secondary | ICD-10-CM

## 2011-12-06 DIAGNOSIS — H33009 Unspecified retinal detachment with retinal break, unspecified eye: Secondary | ICD-10-CM

## 2011-12-12 ENCOUNTER — Other Ambulatory Visit: Payer: Self-pay | Admitting: Cardiothoracic Surgery

## 2011-12-12 DIAGNOSIS — R42 Dizziness and giddiness: Secondary | ICD-10-CM

## 2011-12-12 DIAGNOSIS — G44009 Cluster headache syndrome, unspecified, not intractable: Secondary | ICD-10-CM

## 2011-12-13 ENCOUNTER — Other Ambulatory Visit (HOSPITAL_COMMUNITY)
Admission: RE | Admit: 2011-12-13 | Discharge: 2011-12-13 | Disposition: A | Discharge status: Home / Self Care | Payer: PRIVATE HEALTH INSURANCE | Source: Ambulatory Visit | Attending: Internal Medicine | Admitting: Internal Medicine

## 2011-12-13 ENCOUNTER — Ambulatory Visit
Admission: RE | Admit: 2011-12-13 | Discharge: 2011-12-13 | Disposition: A | Discharge status: Home / Self Care | Payer: PRIVATE HEALTH INSURANCE | Source: Ambulatory Visit | Attending: Cardiothoracic Surgery | Admitting: Cardiothoracic Surgery

## 2011-12-13 ENCOUNTER — Encounter: Payer: Self-pay | Admitting: *Deleted

## 2011-12-13 ENCOUNTER — Other Ambulatory Visit: Payer: Self-pay | Admitting: *Deleted

## 2011-12-13 ENCOUNTER — Ambulatory Visit
Admission: RE | Admit: 2011-12-13 | Discharge: 2011-12-13 | Disposition: A | Discharge status: Home / Self Care | Payer: PRIVATE HEALTH INSURANCE | Source: Ambulatory Visit | Attending: Radiation Oncology | Admitting: Radiation Oncology

## 2011-12-13 ENCOUNTER — Ambulatory Visit (HOSPITAL_BASED_OUTPATIENT_CLINIC_OR_DEPARTMENT_OTHER): Payer: PRIVATE HEALTH INSURANCE | Admitting: Internal Medicine

## 2011-12-13 ENCOUNTER — Encounter: Payer: Self-pay | Admitting: Radiation Oncology

## 2011-12-13 ENCOUNTER — Encounter: Payer: Self-pay | Admitting: Internal Medicine

## 2011-12-13 VITALS — BP 101/70 | HR 105 | Temp 97.2°F | Resp 16 | Ht 62.0 in | Wt 107.5 lb

## 2011-12-13 VITALS — BP 103/77 | HR 105 | Temp 97.7°F | Resp 16 | Ht 62.0 in | Wt 107.5 lb

## 2011-12-13 DIAGNOSIS — C3490 Malignant neoplasm of unspecified part of unspecified bronchus or lung: Secondary | ICD-10-CM

## 2011-12-13 DIAGNOSIS — R42 Dizziness and giddiness: Secondary | ICD-10-CM

## 2011-12-13 DIAGNOSIS — R918 Other nonspecific abnormal finding of lung field: Secondary | ICD-10-CM

## 2011-12-13 DIAGNOSIS — G939 Disorder of brain, unspecified: Secondary | ICD-10-CM

## 2011-12-13 DIAGNOSIS — G44009 Cluster headache syndrome, unspecified, not intractable: Secondary | ICD-10-CM

## 2011-12-13 DIAGNOSIS — F17200 Nicotine dependence, unspecified, uncomplicated: Secondary | ICD-10-CM

## 2011-12-13 DIAGNOSIS — C3410 Malignant neoplasm of upper lobe, unspecified bronchus or lung: Secondary | ICD-10-CM

## 2011-12-13 HISTORY — DX: Malignant neoplasm of unspecified part of unspecified bronchus or lung: C34.90

## 2011-12-13 HISTORY — DX: Disorder of brain, unspecified: G93.9

## 2011-12-13 MED ORDER — IOHEXOL 300 MG/ML  SOLN
75.0000 mL | Freq: Once | INTRAMUSCULAR | Status: AC | PRN
  Administered 2011-12-13: 75 mL via INTRAVENOUS

## 2011-12-13 NOTE — Progress Notes (Signed)
[facility]  Clinical Social Work  Clinical Social Work met with patient, patient's sister, Systems developer, and thoracic navigator during MTOC.  MD discussed patient's diagnosis and treatment plan.  Patient will be meeting with radiation oncologist after this appointment.  Ms. Swoveland is single and works third shift at [employer] in [place]. Patient plans to contact CSW with any questions or concerns.  Kathrin Penner, MSW, LCSW Clinical Social Worker [facility] [phone]

## 2011-12-13 NOTE — Progress Notes (Signed)
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 female who presented to her PCP office with chest and back pain.  CXR showed a large right lung mass.  Subsequent CT and PET confirmed a hypermetabolic 8.5 cm right upper lung mass with broad extension into the right mediastinum.  CT-biopsy shows adenocarcinoma. PREVIOUS RADIATION THERAPY: No  PAST MEDICAL HISTORY:  has a past medical history of Nodule of right lung; Anemia; Rectovaginal fistula; and Lung cancer (11/23/11).    PAST SURGICAL HISTORY: Past Surgical History  Procedure Date  . Partial hysterectomy   . Hemmorhoid surgery   . Neck surgery   . Retinal detachment surgery     FAMILY HISTORY: family history includes Alzheimer's disease in her brother and sister; Heart disease in her father; and Kidney disease in her mother.  SOCIAL HISTORY:  reports that she has been smoking Cigarettes.  She has a 43 pack-year smoking history. She has never used smokeless tobacco. She reports that she does not drink alcohol or use illicit drugs.  ALLERGIES: Levaquin  MEDICATIONS:  Current Outpatient Prescriptions  Medication Sig Dispense Refill  . calcium-vitamin D (OSCAL WITH D) 500-200 MG-UNIT per tablet Take 1 tablet by mouth daily.      [name] glucosamine-chondroitin 500-400 MG tablet Take 1 tablet by mouth daily.       [name] PRESCRIPTION MEDICATION Place into the left eye every 30 (thirty) days. Bevacizumab (Avastin) inj into the left eye      . vitamin E 400 UNIT capsule Take 400 Units by mouth daily.        REVIEW OF SYSTEMS:  A 15 point review of systems is  documented in the electronic medical record. This was obtained by the nursing staff. However, I reviewed this with the patient to discuss relevant findings and make appropriate changes.  Pertinent items are noted in HPI.   PHYSICAL EXAM:  height is 5\' 2"  (1.575 m) and weight is 107 lb 8 oz (48.762 kg). Her oral temperature is 97.7 F (36.5 C). Her blood pressure is 103/77 and her pulse is 105. Her respiration is 16 and oxygen saturation is 97%.   She is in no acute distress.  Respiratory effort is unlabored.  LABORATORY DATA:  Lab Results  Component Value Date   WBC 17.4* 11/23/2011   HGB 11.3* 11/23/2011   HCT 34.8* 11/23/2011   MCV 87.7 11/23/2011   PLT 496* 11/23/2011   No results found for this basename: NA, K, CL, CO2   No results found for this basename: ALT, AST, GGT, ALKPHOS, BILITOT     RADIOGRAPHY: Dg Chest 1 View  11/23/2011  *RADIOLOGY REPORT*  Clinical Data:  Post biopsy of right upper lobe mass  CHEST - 1 VIEW  Comparison: 11/06/2011  Findings: Large right upper lobe mass identified similar in appearance to the previous exam, 8.6 x 6.2 cm. No pneumothorax. Normal heart size, mediastinal contours and pulmonary vascularity. Emphysematous and chronic bronchitic changes. No definite infiltrate, pleural effusion or additional mass/nodule. Prior cervical spine fusion.  IMPRESSION: No pneumothorax following biopsy of large right upper lobe mass.   Original Report  Authenticated By: Lollie Marrow, M.D.    Nm Pet Image Initial (pi) Skull Base To Thigh  11/14/2011  *RADIOLOGY REPORT*  Clinical Data: Initial treatment strategy for right lung mass.  NUCLEAR MEDICINE PET SKULL BASE TO THIGH  Fasting Blood Glucose:  90  Technique:  17.0 mCi F-18 FDG was injected intravenously. CT data was obtained and used for attenuation correction and anatomic localization only.  (This was not acquired as a diagnostic CT examination.) Additional exam technical data entered on technologist worksheet.  Comparison:   CT chest 11/07/2011.  Findings:  Neck: Note is made of misregistration artifact on the neck images. No abnormal hypermetabolism in the neck. CT images show no acute findings.  Chest:  A large mass in the right upper lobe measures approximately 9.5 x 6.8 cm and is intensely hypermetabolic, with an S U V max of 31.8.  Focal hypermetabolism is seen in the right hilum, with an S U V max of approximately 19.2.  A minimally hypermetabolic low right paratracheal lymph node measures 9 mm with an S U V max of 3.4.  Asymmetric focal hypermetabolism is seen in the left paraspinous musculature without definite CT correlate.  Focal ground-glass, seen in the medial right lower lobe on 11/07/2011, is less prominent on the current study. Postobstructive pneumonitis in the right upper lobe.  Apical segmental bronchus to the right upper lobe is completely obstructed.  No pericardial or pleural effusion.  Abdomen/Pelvis:  No abnormal hypermetabolic activity within the liver, pancreas, adrenal glands, or spleen.  No hypermetabolic lymph nodes in the abdomen or pelvis.  CT images show a 2.1 cm stone in the gallbladder.  A 10 mm low attenuation lesion in the right hepatic lobe is likely a cyst.  No acute findings.  Skeleton:  No focal hypermetabolic activity to suggest skeletal metastasis.  IMPRESSION:  1.  Intensely hypermetabolic right upper lobe mass with hypermetabolic right hilar and low right paratracheal adenopathy, most consistent with primary bronchogenic carcinoma.  Imaging characteristics are indicative of T3N2M0 or stage IIIA disease.  2.  Asymmetric hypermetabolic activity in the left paraspinous musculature may be physiologic as there is no definite CT correlate. 3.  Improving ground-glass attenuation in the medial right lower lobe. 4.  Cholelithiasis   Original Report Authenticated By: Reyes Ivan, M.D.    Ct Biopsy  11/23/2011  *RADIOLOGY REPORT*  Indication: Peripherally hypermetabolic right upper lobe pulmonary  mass, presumed primary bronchogenic carcinoma.  CT GUIDED RIGHT UPPER LOBE NODULE FINE NEEDLE ASPIRATION AND CORE NEEDLE BIOPSY  Comparisons: PET CT - 11/14/2011; chest CT - 11/07/2011  Intravenous medications: Fentanyl 25 mcg IV; Versed 1.5 mg IV  Contrast: None  Sedation time: 16 minutes  Complications: None immediate  TECHNIQUE/FINDINGS:  Informed consent was obtained from the patient following an explanation of the procedure, risks, benefits and alternatives. The patient understands, agrees and consents for the procedure. All questions were addressed.  A time out was performed prior to the initiation of the procedure.  The patient was positioned right lateral decubitus, slightly RAO on the CT table and a limited chest CT was performed for procedural planning demonstrating grossly unchanged large, approximately 8.8 x 6.7 cm mass within the posterior aspect of the right upper lobe. The operative site was prepped and draped in the usual sterile fashion.  Under sterile conditions and local anesthesia, a 17 gauge coaxial needle was advanced into the peripheral aspect of the nodule, at the location of increased metabolic activity on preprocedural CT scan.  Positioning was confirmed with intermittent CT fluoroscopy and 3 FNA samples were obtained with the use of a 20 gauge Francine needle.  Quick stain pathologic review confirmed lesional tissue.  This was followed by the acquisition of 3 core biopsies with an 18 gauge core needle biopsy device.  Limited post procedural chest CT was negative for pneumothorax or additional complication.  The co-axial needle was removed and hemostasis was achieved with manual compression.  A dressing was placed.  The patient tolerated the procedure well without immediate postprocedural complication.  The patient was escorted to have an upright chest radiograph.  IMPRESSION:  Technically successful CT guided fine needle aspiration and core needle biopsy of  peripherally hypermetabolic  right upper lobe pulmonary mass.   Original Report Authenticated By: Waynard Reeds, M.D.       IMPRESSION: Ms. Clucas is a very nice 61 year old woman with a large unresectable stage IIIA adenocarcinoma of the right lung. She is a candidate for chemo-radiation.  PLAN:  Today, we spent time reviewing her work-up thus far.  We talked the MTOC consensus recommendation.  We discussed pros and cons and all of the details of treatment.  She would like to proceed and will undergo CT simulation tomorrow.   I spent 60 minutes minutes face to face with the patient and more than 50% of that time was spent in counseling and/or coordination of care.   ------------------------------------------------  Artist Pais. Kathrynn Running, M.D.

## 2011-12-13 NOTE — Progress Notes (Signed)
[facility] [phone]   Fax:([phone]) [phone]  CONSULT NOTE  REASON FOR CONSULTATION:  61 years old white female diagnosed with lung cancer.  HPI Melanie Liu is a 60 y.o. female was past medical history significant for anemia and long history of smoking. The patient was seen by her primary care physician recently for routine annual physical examination. She had chest x-ray performed on 11/06/2011 which showed new 8.0 CM right upper lobe carcinoma of the lung. This was followed by CT scan of the chest with contrast on 11/07/2011 and it showed 8.0 CM right upper lobe lung mass consistent with primary lung neoplasm. The mass abuts the trachea and surrounds the right upper lobe bronchus. There was probable metastatic pretracheal lymph nodes. There was also ill-defined 1.5 CM groundglass opacity in the superior segment of the right lower lobe. A PET scan on 11/13/2013 showed intensely hypermetabolic right upper lobe mass with hypermetabolic right hilar and low right paratracheal adenopathy, most consistent with primary bronchogenic carcinoma. There was improving groundglass attenuation in the medial right lower lobe. On 11/23/2011 the patient underwent CT-guided right upper lobe nodule fine needle aspiration and core biopsy by interventional radiology. The final pathology showed non-small cell carcinoma. The carcinoma is positive with cytokeratin 7, thyroid transcription factor-1 and Napsin-A. There is alsopositivity with p63. The tumor is negative for cytokeratin 903 and cytokeratin 5/6. The immunophenotype features favor adenocarcinoma. The tissue blocks were sent for EGFR mutation as well as ALK gene translocation but the final results still pending. The patient was referred to me today for evaluation and recommendation regarding treatment of her condition. When seen today she still complaining of soreness at the site of the lung biopsy as well as shortness of breath  increased with exertion. She lost several pounds since May of 2013. The patient denied having any significant chest pain, cough or hemoptysis. She denied having any headache or blurry vision. Her family history is significant for her father with heart disease and mother with kidney failure. The patient is single and has 2 children a son who is age 72 and daughter 78. 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 The patient has a history of smoking more than one pack per day for around 40 years and unfortunately she continues to smoke and I strongly advise him to quit smoking and provided her with information about to smoke cessation programs. She has no history of alcohol or drug abuse.  @SFHPI @  Past Medical History  Diagnosis Date  . Nodule of right lung     right upper lobe  . Anemia   . Rectovaginal fistula     Past Surgical History  Procedure Date  . Partial hysterectomy   . Hemmorhoid surgery   . Neck surgery   . Retinal detachment surgery     Family History  Problem Relation Age of Onset  . Heart disease Father   . Kidney disease Mother   . Alzheimer's disease Sister   . Alzheimer's disease Brother     Social History History  Substance Use Topics  . Smoking status: Current Every Day Smoker -- 1.0 packs/day for 43 years    Types: Cigarettes  . Smokeless tobacco: Never Used  . Alcohol Use: No    Allergies  Allergen Reactions  . Levaquin (Levofloxacin In D5w)     Current Outpatient Prescriptions  Medication Sig Dispense Refill  . calcium-vitamin D (OSCAL WITH D) 500-200 MG-UNIT per tablet Take 1 tablet  by mouth daily.      [name] glucosamine-chondroitin 500-400 MG tablet Take 1 tablet by mouth daily.       [name] PRESCRIPTION MEDICATION Place into the left eye every 30 (thirty) days. Bevacizumab (Avastin) inj into the left eye      . vitamin E 400 UNIT capsule Take 400 Units by mouth daily.        Review of Systems  A comprehensive review of systems was negative  except for: Constitutional: positive for weight loss Respiratory: positive for dyspnea on exertion  Physical Exam  WUJ:WJXBJ, healthy, no distress, well nourished and well developed SKIN: skin color, texture, turgor are normal HEAD: Normocephalic EYES: normal EARS: External ears normal OROPHARYNX:no exudate and no erythema  NECK: supple, no adenopathy LYMPH:  no palpable lymphadenopathy, no hepatosplenomegaly BREAST:not examined LUNGS: clear to auscultation  HEART: regular rate & rhythm, no murmurs and no gallops ABDOMEN:abdomen soft, non-tender, normal bowel sounds and no masses or organomegaly BACK: Back symmetric, no curvature. EXTREMITIES:no edema, no skin discoloration, no clubbing, no cyanosis  NEURO: alert & oriented x 3 with fluent speech, no focal motor/sensory deficits  PERFORMANCE STATUS: ECOG 1  LABORATORY DATA: Lab Results  Component Value Date   WBC 17.4* 11/23/2011   HGB 11.3* 11/23/2011   HCT 34.8* 11/23/2011   MCV 87.7 11/23/2011   PLT 496* 11/23/2011      Chemistry   No results found for this basename: NA, K, CL, CO2, BUN, CREATININE, GLU   No results found for this basename: CALCIUM, ALKPHOS, AST, ALT, BILITOT       RADIOGRAPHIC STUDIES: Dg Chest 1 View  11/23/2011  *RADIOLOGY REPORT*  Clinical Data:  Post biopsy of right upper lobe mass  CHEST - 1 VIEW  Comparison: 11/06/2011  Findings: Large right upper lobe mass identified similar in appearance to the previous exam, 8.6 x 6.2 cm. No pneumothorax. Normal heart size, mediastinal contours and pulmonary vascularity. Emphysematous and chronic bronchitic changes. No definite infiltrate, pleural effusion or additional mass/nodule. Prior cervical spine fusion.  IMPRESSION: No pneumothorax following biopsy of large right upper lobe mass.   Original Report Authenticated By: Lollie Marrow, M.D.    Nm Pet Image Initial (pi) Skull Base To Thigh  11/14/2011  *RADIOLOGY REPORT*  Clinical Data: Initial treatment strategy  for right lung mass.  NUCLEAR MEDICINE PET SKULL BASE TO THIGH  Fasting Blood Glucose:  90  Technique:  17.0 mCi F-18 FDG was injected intravenously. CT data was obtained and used for attenuation correction and anatomic localization only.  (This was not acquired as a diagnostic CT examination.) Additional exam technical data entered on technologist worksheet.  Comparison:  CT chest 11/07/2011.  Findings:  Neck: Note is made of misregistration artifact on the neck images. No abnormal hypermetabolism in the neck. CT images show no acute findings.  Chest:  A large mass in the right upper lobe measures approximately 9.5 x 6.8 cm and is intensely hypermetabolic, with an S U V max of 31.8.  Focal hypermetabolism is seen in the right hilum, with an S U V max of approximately 19.2.  A minimally hypermetabolic low right paratracheal lymph node measures 9 mm with an S U V max of 3.4.  Asymmetric focal hypermetabolism is seen in the left paraspinous musculature without definite CT correlate.  Focal ground-glass, seen in the medial right lower lobe on 11/07/2011, is less prominent on the current study. Postobstructive pneumonitis in the right upper lobe.  Apical segmental bronchus to the  right upper lobe is completely obstructed.  No pericardial or pleural effusion.  Abdomen/Pelvis:  No abnormal hypermetabolic activity within the liver, pancreas, adrenal glands, or spleen.  No hypermetabolic lymph nodes in the abdomen or pelvis.  CT images show a 2.1 cm stone in the gallbladder.  A 10 mm low attenuation lesion in the right hepatic lobe is likely a cyst.  No acute findings.  Skeleton:  No focal hypermetabolic activity to suggest skeletal metastasis.  IMPRESSION:  1.  Intensely hypermetabolic right upper lobe mass with hypermetabolic right hilar and low right paratracheal adenopathy, most consistent with primary bronchogenic carcinoma.  Imaging characteristics are indicative of T3N2M0 or stage IIIA disease.  2.  Asymmetric  hypermetabolic activity in the left paraspinous musculature may be physiologic as there is no definite CT correlate. 3.  Improving ground-glass attenuation in the medial right lower lobe. 4.  Cholelithiasis   Original Report Authenticated By: Reyes Ivan, M.D.    Ct Biopsy  11/23/2011  *RADIOLOGY REPORT*  Indication: Peripherally hypermetabolic right upper lobe pulmonary mass, presumed primary bronchogenic carcinoma.  CT GUIDED RIGHT UPPER LOBE NODULE FINE NEEDLE ASPIRATION AND CORE NEEDLE BIOPSY  Comparisons: PET CT - 11/14/2011; chest CT - 11/07/2011  Intravenous medications: Fentanyl 25 mcg IV; Versed 1.5 mg IV  Contrast: None  Sedation time: 16 minutes  Complications: None immediate  TECHNIQUE/FINDINGS:  Informed consent was obtained from the patient following an explanation of the procedure, risks, benefits and alternatives. The patient understands, agrees and consents for the procedure. All questions were addressed.  A time out was performed prior to the initiation of the procedure.  The patient was positioned right lateral decubitus, slightly RAO on the CT table and a limited chest CT was performed for procedural planning demonstrating grossly unchanged large, approximately 8.8 x 6.7 cm mass within the posterior aspect of the right upper lobe. The operative site was prepped and draped in the usual sterile fashion.  Under sterile conditions and local anesthesia, a 17 gauge coaxial needle was advanced into the peripheral aspect of the nodule, at the location of increased metabolic activity on preprocedural CT scan.  Positioning was confirmed with intermittent CT fluoroscopy and 3 FNA samples were obtained with the use of a 20 gauge Francine needle.  Quick stain pathologic review confirmed lesional tissue.  This was followed by the acquisition of 3 core biopsies with an 18 gauge core needle biopsy device.  Limited post procedural chest CT was negative for pneumothorax or additional complication.  The  co-axial needle was removed and hemostasis was achieved with manual compression.  A dressing was placed.  The patient tolerated the procedure well without immediate postprocedural complication.  The patient was escorted to have an upright chest radiograph.  IMPRESSION:  Technically successful CT guided fine needle aspiration and core needle biopsy of  peripherally hypermetabolic right upper lobe pulmonary mass.   Original Report Authenticated By: Waynard Reeds, M.D.     ASSESSMENT: This is a very pleasant 61 years old white female recently diagnosed with stage IIIa (T4, N2, Mx) non-small cell lung cancer, adenocarcinoma pending further workup including brain imaging to rule out brain metastasis.  PLAN: I have a lengthy discussion with the patient and her sister today about her disease stage, prognosis and treatment options. I recommended for the patient to complete the staging workup and she is scheduled for CT of the head later today. If the patient has no evidence for metastatic disease to the brain she would be  treated for stage IIIa non-small cell lung cancer with a course of concurrent chemoradiation with weekly carboplatin for AUC of 2 and paclitaxel 45 mg/M2 for a total of 6-7 weeks. I discussed with the patient adverse effect of this treatment including but not limited to alopecia, myelosuppression, nausea vomiting, liver or renal dysfunction. If the patient has evidence for metastatic brain lesions she would require palliative radiotherapy to the brain first, followed by systemic chemotherapy most likely with carboplatin and Alimta or biologic agent at her EGFR mutation or ALK gene translocation is positive.  The patient would come back for followup visit in 2 weeks for reevaluation and discussion of her treatment options in detail. I gave the patient the time to ask questions and I answered them completely to her satisfaction. The patient will be seen later today by Dr. Kathrynn Running for evaluation  and discussion of her radiotherapy option. She was also seen today by the thoracic navigator as well as the social worker at the cancer Center All questions were answered. The patient knows to call the clinic with any problems, questions or concerns. We can certainly see the patient much sooner if necessary.  Thank you so much for allowing me to participate in the care of Melanie Liu. I will continue to follow up the patient with you and assist in her care.  I spent 30 minutes counseling the patient face to face. The total time spent in the appointment was 60 minutes.  Sadaf Przybysz K. 12/13/2011, 2:50 PM

## 2011-12-13 NOTE — Progress Notes (Signed)
Spoke with pt and sister today at [facility].  Educational/resource information given and explained.

## 2011-12-14 ENCOUNTER — Ambulatory Visit
Admission: RE | Admit: 2011-12-14 | Discharge: 2011-12-14 | Disposition: A | Discharge status: Home / Self Care | Payer: PRIVATE HEALTH INSURANCE | Source: Ambulatory Visit | Attending: Radiation Oncology | Admitting: Radiation Oncology

## 2011-12-14 ENCOUNTER — Encounter: Payer: Self-pay | Admitting: *Deleted

## 2011-12-14 ENCOUNTER — Telehealth: Payer: Self-pay | Admitting: *Deleted

## 2011-12-14 ENCOUNTER — Encounter: Payer: Self-pay | Admitting: Radiation Oncology

## 2011-12-14 VITALS — BP 95/61 | HR 104 | Temp 98.2°F | Resp 20 | Wt 103.6 lb

## 2011-12-14 DIAGNOSIS — C7949 Secondary malignant neoplasm of other parts of nervous system: Secondary | ICD-10-CM | POA: Insufficient documentation

## 2011-12-14 DIAGNOSIS — C7931 Secondary malignant neoplasm of brain: Secondary | ICD-10-CM | POA: Insufficient documentation

## 2011-12-14 DIAGNOSIS — K209 Esophagitis, unspecified without bleeding: Secondary | ICD-10-CM | POA: Insufficient documentation

## 2011-12-14 DIAGNOSIS — L988 Other specified disorders of the skin and subcutaneous tissue: Secondary | ICD-10-CM | POA: Insufficient documentation

## 2011-12-14 DIAGNOSIS — C3410 Malignant neoplasm of upper lobe, unspecified bronchus or lung: Secondary | ICD-10-CM | POA: Insufficient documentation

## 2011-12-14 DIAGNOSIS — C3490 Malignant neoplasm of unspecified part of unspecified bronchus or lung: Secondary | ICD-10-CM

## 2011-12-14 DIAGNOSIS — R918 Other nonspecific abnormal finding of lung field: Secondary | ICD-10-CM

## 2011-12-14 DIAGNOSIS — Z510 Encounter for antineoplastic radiation therapy: Secondary | ICD-10-CM | POA: Insufficient documentation

## 2011-12-14 MED ORDER — DEXAMETHASONE 4 MG PO TABS: 2.0000 mg | ORAL_TABLET | Freq: Two times a day (BID) | ORAL | Status: DC

## 2011-12-14 NOTE — Progress Notes (Signed)
  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  She has a solitary brain met and will need an MRI and neurosurgery consult to potentially set-up SRS vs. Resection.  Then she was transferred to the CT Simulation planning suite.  Identity was confirmed.  All relevant records and images related to the planned course of therapy were reviewed.  The patient freely provided informed written consent to proceed with treatment after reviewing the details related to the planned course of therapy. The consent form was witnessed and verified by the simulation staff.  Then, the patient was set-up in a stable reproducible  supine position for radiation therapy.  CT images were obtained.  Surface markings were placed.  The CT images were loaded into the planning software.  Then the target and avoidance structures were contoured.  Treatment planning then occurred.  The radiation prescription was entered and confirmed.  A total of 6 complex treatment devices were fabricated. These included a vac-loc positioner for arm positioning and then 5 MLC apertures to shape radiation around the lung tumor.  I have requested : 3D Simulation  I have requested a DVH of the following structures: targets, heart, lungs, spinal cord and esophagus.  I have ordered:Nutrition Consult  PLAN:  The patient will receive 66 Gy in 33 fractions.  ________________________________  Artist Pais Kathrynn Running, M.D.

## 2011-12-14 NOTE — Progress Notes (Signed)
Pt denied pain, fatigue. Dr Kathrynn Running in to see pt prior to ct sim.

## 2011-12-14 NOTE — Telephone Encounter (Signed)
weekly with chemo, start 10/21  Chemo Teaching   Appt w/ Mid Level;3 weeks Visit length 30 minutes  Sent michelle email to set up patient's treatment  Left voice message to inform the patient of all the appointments asked patient to please come by scheduling and pick up an full schedule

## 2011-12-14 NOTE — Progress Notes (Signed)
Made referral for nutrition consult

## 2011-12-14 NOTE — Progress Notes (Addendum)
Met with patient to discuss RO billing.  Dx: 198.3 Brain and spinal cord  Attending Rad: Dr. Landis Gandy Tx: [id number] Extrl Beam & SRS Robot [phone]

## 2011-12-14 NOTE — Progress Notes (Signed)
Pt denies pain, loss of appetite, does report fatigue. Pt works full time night shift as CNA. Slight dry cough, denies SOB.

## 2011-12-15 NOTE — Patient Instructions (Signed)
You had a diagnosis of non-small cell lung cancer, questionable stage IIIa. You are scheduled later today for CT scan of the head to rule out brain metastasis.

## 2011-12-17 ENCOUNTER — Other Ambulatory Visit: Payer: Self-pay | Admitting: Radiation Therapy

## 2011-12-17 ENCOUNTER — Telehealth: Payer: Self-pay | Admitting: *Deleted

## 2011-12-17 ENCOUNTER — Other Ambulatory Visit: Payer: Self-pay | Admitting: *Deleted

## 2011-12-17 DIAGNOSIS — C7931 Secondary malignant neoplasm of brain: Secondary | ICD-10-CM

## 2011-12-17 NOTE — Telephone Encounter (Signed)
Per staff message and POF I have scheduled appts.  [name]  

## 2011-12-18 ENCOUNTER — Ambulatory Visit: Payer: PRIVATE HEALTH INSURANCE

## 2011-12-18 ENCOUNTER — Other Ambulatory Visit: Payer: Self-pay | Admitting: Radiation Therapy

## 2011-12-18 ENCOUNTER — Other Ambulatory Visit: Payer: Self-pay | Admitting: Internal Medicine

## 2011-12-18 DIAGNOSIS — C7949 Secondary malignant neoplasm of other parts of nervous system: Secondary | ICD-10-CM

## 2011-12-20 ENCOUNTER — Encounter: Payer: Self-pay | Admitting: Radiation Oncology

## 2011-12-20 ENCOUNTER — Ambulatory Visit
Admission: RE | Admit: 2011-12-20 | Discharge: 2011-12-20 | Disposition: A | Discharge status: Home / Self Care | Payer: PRIVATE HEALTH INSURANCE | Source: Ambulatory Visit | Attending: Radiation Oncology | Admitting: Radiation Oncology

## 2011-12-20 ENCOUNTER — Other Ambulatory Visit: Payer: PRIVATE HEALTH INSURANCE

## 2011-12-20 ENCOUNTER — Encounter: Payer: Self-pay | Admitting: *Deleted

## 2011-12-20 DIAGNOSIS — C7931 Secondary malignant neoplasm of brain: Secondary | ICD-10-CM

## 2011-12-20 DIAGNOSIS — C7949 Secondary malignant neoplasm of other parts of nervous system: Secondary | ICD-10-CM

## 2011-12-20 DIAGNOSIS — C3490 Malignant neoplasm of unspecified part of unspecified bronchus or lung: Secondary | ICD-10-CM | POA: Insufficient documentation

## 2011-12-20 NOTE — Progress Notes (Signed)
12/20/11 Mrs. Diefenbach met with the interviewer today to complete the [id number] Study.

## 2011-12-21 ENCOUNTER — Ambulatory Visit
Admission: RE | Admit: 2011-12-21 | Discharge: 2011-12-21 | Disposition: A | Discharge status: Home / Self Care | Payer: PRIVATE HEALTH INSURANCE | Source: Ambulatory Visit | Attending: Radiation Oncology | Admitting: Radiation Oncology

## 2011-12-21 ENCOUNTER — Encounter: Payer: Self-pay | Admitting: *Deleted

## 2011-12-21 DIAGNOSIS — C3490 Malignant neoplasm of unspecified part of unspecified bronchus or lung: Secondary | ICD-10-CM

## 2011-12-21 DIAGNOSIS — C7931 Secondary malignant neoplasm of brain: Secondary | ICD-10-CM

## 2011-12-21 DIAGNOSIS — C7949 Secondary malignant neoplasm of other parts of nervous system: Secondary | ICD-10-CM

## 2011-12-21 MED ORDER — GADOBENATE DIMEGLUMINE 529 MG/ML IV SOLN
10.0000 mL | Freq: Once | INTRAVENOUS | Status: AC | PRN
  Administered 2011-12-21: 10 mL via INTRAVENOUS

## 2011-12-21 NOTE — Progress Notes (Signed)
Incoming Fax---Clarient results on Biopsy [phone],  ALK gene rearrangement by FISH-NOT DETECTED. Results to Dr Arbutus Ped for review.

## 2011-12-21 NOTE — Progress Notes (Signed)
  Radiation Oncology         [phone] ________________________________  Name: Melanie Liu MRN: [id number]  Date: 12/21/2011  DOB: 1950/12/02  Simulation Verification Note  Status: outpatient  NARRATIVE: The patient was brought to the treatment unit and placed in the planned treatment position. The clinical setup was verified. Then port films were obtained and uploaded to the radiation oncology medical record software.  The treatment beams were carefully compared against the planned radiation fields. The position location and shape of the radiation fields was reviewed. They targeted volume of tissue appears to be appropriately covered by the radiation beams. Organs at risk appear to be excluded as planned.  Based on my personal review, I approved the simulation verification. The patient's treatment will proceed as planned.  ------------------------------------------------  Melanie Liu, M.D.

## 2011-12-23 NOTE — Progress Notes (Signed)
  Radiation Oncology         [phone] ________________________________  Name: Melanie Liu MRN: [id number]  Date: 12/24/2011  DOB: Jul 21, 1950  SIMULATION AND TREATMENT PLANNING NOTE  DIAGNOSIS:  61 yo woman with a solitary anterior right frontal lobe ring enhancing 2 cm brain metastasis from metastatic lung cancer  NARRATIVE:  The patient was brought to the CT Simulation planning suite.  Identity was confirmed.  All relevant records and images related to the planned course of therapy were reviewed.  The patient freely provided informed written consent to proceed with treatment after reviewing the details related to the planned course of therapy. The consent form was witnessed and verified by the simulation staff. Intravenous access was established for contrast administration. Then, the patient was set-up in a stable reproducible supine position for radiation therapy.  A relocatable thermoplastic stereotactic head frame was fabricated for precise immobilization.  CT images were obtained.  Surface markings were placed.  The CT images were loaded into the planning software and fused with the patient's targeting MRI scan.  Then the target and avoidance structures were contoured.  Treatment planning then occurred.  The radiation prescription was entered and confirmed.  I have requested 3D planning  I have requested a DVH of the following structures: Brain stem, brain, left eye, right I, lenses, optic chiasm, target volumes, uninvolved brain, and normal tissue.    PLAN:  The patient will receive 20 Gy in one fraction.  ________________________________  Artist Pais Kathrynn Running, M.D.

## 2011-12-24 ENCOUNTER — Ambulatory Visit
Admission: RE | Admit: 2011-12-24 | Discharge: 2011-12-24 | Disposition: A | Discharge status: Home / Self Care | Payer: PRIVATE HEALTH INSURANCE | Source: Ambulatory Visit | Attending: Radiation Oncology | Admitting: Radiation Oncology

## 2011-12-24 ENCOUNTER — Telehealth: Payer: Self-pay | Admitting: *Deleted

## 2011-12-24 ENCOUNTER — Ambulatory Visit
Admission: RE | Admit: 2011-12-24 | Discharge: 2011-12-24 | Disposition: A | Discharge status: Home / Self Care | Payer: PRIVATE HEALTH INSURANCE | Source: Ambulatory Visit | Attending: Neurosurgery | Admitting: Neurosurgery

## 2011-12-24 ENCOUNTER — Other Ambulatory Visit: Payer: Self-pay | Admitting: Internal Medicine

## 2011-12-24 ENCOUNTER — Ambulatory Visit: Payer: PRIVATE HEALTH INSURANCE

## 2011-12-24 ENCOUNTER — Other Ambulatory Visit: Payer: PRIVATE HEALTH INSURANCE | Admitting: Lab

## 2011-12-24 DIAGNOSIS — C3490 Malignant neoplasm of unspecified part of unspecified bronchus or lung: Secondary | ICD-10-CM

## 2011-12-24 DIAGNOSIS — C7949 Secondary malignant neoplasm of other parts of nervous system: Secondary | ICD-10-CM

## 2011-12-24 LAB — CBC WITH DIFFERENTIAL/PLATELET
BASO%: 1 % (ref 0.0–2.0)
EOS%: 1.9 % (ref 0.0–7.0)
HCT: 35.4 % (ref 34.8–46.6)
LYMPH%: 16.9 % (ref 14.0–49.7)
MCH: 27.9 pg (ref 25.1–34.0)
MCHC: 31.5 g/dL (ref 31.5–36.0)
MCV: 88.6 fL (ref 79.5–101.0)
MONO%: 7.3 % (ref 0.0–14.0)
NEUT%: 72.9 % (ref 38.4–76.8)
Platelets: 413 10*3/uL — ABNORMAL HIGH (ref 145–400)

## 2011-12-24 MED ORDER — SODIUM CHLORIDE 0.9 % IJ SOLN
10.0000 mL | Freq: Once | INTRAMUSCULAR | Status: AC
  Administered 2011-12-24: 10 mL via INTRAVENOUS

## 2011-12-24 NOTE — Progress Notes (Signed)
Per Dr Kathrynn Running, IV started in [facility] w/#22 angiocath and blood drawn for CBC. IV flushed and saline locked, w/o redness, dressing clean, dry intact. Pt to ct sim. After completion of ct sim, IV d/c per [facility] Boyles; IV site w/o redness, swelling.

## 2011-12-24 NOTE — Telephone Encounter (Signed)
Attending MD statement from [facility] given to Melanie Liu in medical mgmt to review.  SLJ

## 2011-12-24 NOTE — Progress Notes (Signed)
Pt denies pain, HA, dizziness, unsteady gait, nausea, fatigue, loss of appetite. Pt alert, oriented x 3. Pt on Decadron 2 mg total bid.

## 2011-12-25 ENCOUNTER — Ambulatory Visit
Admission: RE | Admit: 2011-12-25 | Discharge: 2011-12-25 | Disposition: A | Discharge status: Home / Self Care | Payer: PRIVATE HEALTH INSURANCE | Source: Ambulatory Visit | Attending: Radiation Oncology | Admitting: Radiation Oncology

## 2011-12-25 ENCOUNTER — Encounter: Payer: Self-pay | Admitting: Internal Medicine

## 2011-12-25 DIAGNOSIS — R918 Other nonspecific abnormal finding of lung field: Secondary | ICD-10-CM

## 2011-12-25 MED ORDER — RADIAPLEXRX EX GEL
Freq: Once | CUTANEOUS | Status: AC
  Administered 2011-12-25: 12:00:00 via TOPICAL

## 2011-12-25 NOTE — Progress Notes (Signed)
Post sim ed completed w/pt. Gave pt "Radiation and You" booklet w/all pertinent information marked and discussed, re: fatigue, difficulty swallowing, painful swallowing, weight loss, need for IVF, dehydration, skin care, pain, nutrition. Gave pt [facility] w/instructions for proper use. Pt scheduled for nutrition consult on 01/02/12. Pt verbalized understanding, had no questions. Gave pt this RN's business card.

## 2011-12-25 NOTE — Addendum Note (Signed)
Encounter addended by: Glennie Hawk, RN on: 12/25/2011  1:43 PM<BR>     Documentation filed: Orders, Inpatient MAR

## 2011-12-25 NOTE — Progress Notes (Signed)
Put disability form on nurse's desk. °

## 2011-12-26 ENCOUNTER — Ambulatory Visit: Payer: PRIVATE HEALTH INSURANCE

## 2011-12-26 ENCOUNTER — Ambulatory Visit
Admission: RE | Admit: 2011-12-26 | Discharge: 2011-12-26 | Disposition: A | Discharge status: Home / Self Care | Payer: PRIVATE HEALTH INSURANCE | Source: Ambulatory Visit | Attending: Radiation Oncology | Admitting: Radiation Oncology

## 2011-12-26 ENCOUNTER — Encounter: Payer: Self-pay | Admitting: Internal Medicine

## 2011-12-27 ENCOUNTER — Other Ambulatory Visit: Payer: Self-pay | Admitting: Medical Oncology

## 2011-12-27 ENCOUNTER — Ambulatory Visit: Payer: PRIVATE HEALTH INSURANCE | Admitting: Radiation Oncology

## 2011-12-27 ENCOUNTER — Encounter: Payer: Self-pay | Admitting: Radiation Oncology

## 2011-12-27 ENCOUNTER — Ambulatory Visit
Admission: RE | Admit: 2011-12-27 | Discharge: 2011-12-27 | Disposition: A | Discharge status: Home / Self Care | Payer: PRIVATE HEALTH INSURANCE | Source: Ambulatory Visit | Attending: Radiation Oncology | Admitting: Radiation Oncology

## 2011-12-27 ENCOUNTER — Ambulatory Visit: Payer: PRIVATE HEALTH INSURANCE

## 2011-12-27 ENCOUNTER — Other Ambulatory Visit: Payer: Self-pay | Admitting: Physician Assistant

## 2011-12-27 VITALS — BP 116/63 | HR 110 | Temp 98.1°F | Resp 20

## 2011-12-27 DIAGNOSIS — R918 Other nonspecific abnormal finding of lung field: Secondary | ICD-10-CM

## 2011-12-27 DIAGNOSIS — Z923 Personal history of irradiation: Secondary | ICD-10-CM

## 2011-12-27 DIAGNOSIS — C7949 Secondary malignant neoplasm of other parts of nervous system: Secondary | ICD-10-CM

## 2011-12-27 HISTORY — DX: Personal history of irradiation: Z92.3

## 2011-12-27 MED ORDER — OXYCODONE-ACETAMINOPHEN 5-325 MG PO TABS: 1.0000 | ORAL_TABLET | ORAL | Status: DC | PRN

## 2011-12-27 NOTE — Op Note (Deleted)
Stereotactic Treatment Procedure Note  Name: Melanie Liu MRN: [id number]  Date: 12/27/2011 DOB: 12/19/1956  SPECIAL TREATMENT PROCEDURE  3D TREATMENT PLANNING AND DOSIMETRY: The patient's radiation plan was reviewed and approved by neurosurgery and radiation oncology prior to treatment. It showed 3-dimensional radiation distributions overlaid onto the planning CT/MRI image set. The [facility] for the target structures as well as the organs at risk were reviewed. The documentation of the 3D plan and dosimetry are filed in the radiation oncology EMR.  NARRATIVE: Melanie Liu was brought to the TrueBeam stereotactic radiation treatment machine and placed supine on the CT couch. The head frame was applied, and the patient was set up for stereotactic radiosurgery. Neurosurgery was present for the set-up and delivery  SIMULATION VERIFICATION: In the couch zero-angle position, the patient underwent Exactrac imaging using the Brainlab system with orthogonal KV images. These were carefully aligned and repeated to confirm treatment position for each of the isocenters. The Exactrac snap film verification was repeated at each couch angle.  SPECIAL TREATMENT PROCEDURE: Melanie Liu received stereotactic radiosurgery to the following targets:  Left cerebellar 8.3 mm target was treated using 4 Dynamic Conformal Arcs to a prescription dose of 20 Gy. ExacTrac Snap verification was performed for each couch angle. The 81.6% isodose line was prescribed.  STEREOTACTIC TREATMENT MANAGEMENT: Following delivery, the patient was transported to nursing in stable condition and monitored for possible acute effects. Vital signs were recorded BP 143/65  Pulse 50  Temp 97.9 F (36.6 C) (Oral)  Resp 20. The patient tolerated treatment without significant acute effects, and was discharged to home in stable condition.  PLAN: Follow-up in one month.  ________________________________  Danae Orleans. Venetia Maxon, M.D.

## 2011-12-27 NOTE — Progress Notes (Signed)
Pt continues to deny pain, HA, nausea, dizziness, vision changes.[name] Resting quietly. VS taken. Pt requests to speak w/Dr Kathrynn Running prior to d/c home.

## 2011-12-27 NOTE — Op Note (Signed)
Stereotactic Treatment Procedure Note  Name: Melanie Liu MRN: [id number]  Date: 12/27/2011 DOB: 06-17-1950  SPECIAL TREATMENT PROCEDURE  3D TREATMENT PLANNING AND DOSIMETRY: The patient's radiation plan was reviewed and approved by neurosurgery and radiation oncology prior to treatment. It showed 3-dimensional radiation distributions overlaid onto the planning CT/MRI image set. The [facility] for the target structures as well as the organs at risk were reviewed. The documentation of the 3D plan and dosimetry are filed in the radiation oncology EMR.  NARRATIVE: Melanie Liu was brought to the TrueBeam stereotactic radiation treatment machine and placed supine on the CT couch. The head frame was applied, and the patient was set up for stereotactic radiosurgery. Neurosurgery was present for the set-up and delivery  SIMULATION VERIFICATION: In the couch zero-angle position, the patient underwent Exactrac imaging using the Brainlab system with orthogonal KV images. These were carefully aligned and repeated to confirm treatment position for each of the isocenters. The Exactrac snap film verification was repeated at each couch angle.  SPECIAL TREATMENT PROCEDURE: Melanie Liu received stereotactic radiosurgery to the following targets:  Right anterior frontal 2 cm target was treated using 4 Dynamic Conformal Arcs to a prescription dose of 20 Gy. ExacTrac Snap verification was performed for each couch angle. The 80.6% isodose line was prescribed.  STEREOTACTIC TREATMENT MANAGEMENT: Following delivery, the patient was transported to nursing in stable condition and monitored for possible acute effects. Vital signs were recorded BP 116/63  Pulse 110  Temp 98.1 F (36.7 C) (Oral)  Resp 20. The patient tolerated treatment without significant acute effects, and was discharged to home in stable condition.  PLAN: Follow-up in one month.  ________________________________  Danae Orleans. Venetia Maxon, M.D.

## 2011-12-27 NOTE — Progress Notes (Signed)
S/P Brain SRS .  Lying in recliner.  Very drowsy but responding to questions appropriately.  Denies any headache, blurred/double vision nor N&V.  Vital signs stable.

## 2011-12-27 NOTE — Progress Notes (Signed)
Eating crackers and drinking gingerale presently.

## 2011-12-27 NOTE — Addendum Note (Signed)
Encounter addended by: Maeola Harman, MD on: 12/27/2011  9:38 PM<BR>     Documentation filed: Inpatient Notes

## 2011-12-27 NOTE — Progress Notes (Signed)
  Radiation Oncology         [phone]) [phone] ________________________________  Stereotactic Treatment Procedure Note  Name: Melanie Liu MRN: [id number]  Date: 12/27/2011  DOB: 12-21-1950  SPECIAL TREATMENT PROCEDURE  3D TREATMENT PLANNING AND DOSIMETRY:  The patient's radiation plan was reviewed and approved by neurosurgery and radiation oncology prior to treatment.  It showed 3-dimensional radiation distributions overlaid onto the planning CT/MRI image set.  The [facility] for the target structures as well as the organs at risk were reviewed. The documentation of the 3D plan and dosimetry are filed in the radiation oncology EMR.  NARRATIVE:  Melanie Liu was brought to the TrueBeam stereotactic radiation treatment machine and placed supine on the CT couch. The head frame was applied, and the patient was set up for stereotactic radiosurgery.  Neurosurgery was present for the set-up and delivery  SIMULATION VERIFICATION:  In the couch zero-angle position, the patient underwent Exactrac imaging using the Brainlab system with orthogonal KV images.  These were carefully aligned and repeated to confirm treatment position for each of the isocenters.  The Exactrac snap film verification was repeated at each couch angle.  SPECIAL TREATMENT PROCEDURE: Melanie Liu received stereotactic radiosurgery to the following targets: Right anterior frontal 2 cm target was treated using 4 Dynamic Conformal Arcs to a prescription dose of 20 Gy.  ExacTrac Snap verification was performed for each couch angle.  The 80.6% isodose line was prescribed.  STEREOTACTIC TREATMENT MANAGEMENT:  Following delivery, the patient was transported to nursing in stable condition and monitored for possible acute effects.  Vital signs were recorded BP 116/63  Pulse 110  Temp 98.1 F (36.7 C) (Oral)  Resp 20. The patient tolerated treatment without significant acute effects, and was discharged to home in stable condition.    PLAN:  Follow-up in one month.  ________________________________  Artist Pais. Kathrynn Running, M.D.

## 2011-12-27 NOTE — Addendum Note (Signed)
Encounter addended by: Maeola Harman, MD on: 12/27/2011  9:36 PM<BR>     Documentation filed: Inpatient Notes

## 2011-12-28 ENCOUNTER — Ambulatory Visit
Admission: RE | Admit: 2011-12-28 | Discharge: 2011-12-28 | Disposition: A | Discharge status: Home / Self Care | Payer: PRIVATE HEALTH INSURANCE | Source: Ambulatory Visit | Attending: Radiation Oncology | Admitting: Radiation Oncology

## 2011-12-28 ENCOUNTER — Ambulatory Visit: Payer: PRIVATE HEALTH INSURANCE

## 2011-12-31 ENCOUNTER — Ambulatory Visit (HOSPITAL_BASED_OUTPATIENT_CLINIC_OR_DEPARTMENT_OTHER): Payer: PRIVATE HEALTH INSURANCE

## 2011-12-31 ENCOUNTER — Ambulatory Visit: Payer: PRIVATE HEALTH INSURANCE

## 2011-12-31 ENCOUNTER — Ambulatory Visit
Admission: RE | Admit: 2011-12-31 | Discharge: 2011-12-31 | Disposition: A | Discharge status: Home / Self Care | Payer: PRIVATE HEALTH INSURANCE | Source: Ambulatory Visit | Attending: Radiation Oncology | Admitting: Radiation Oncology

## 2011-12-31 ENCOUNTER — Other Ambulatory Visit (HOSPITAL_BASED_OUTPATIENT_CLINIC_OR_DEPARTMENT_OTHER): Payer: PRIVATE HEALTH INSURANCE | Admitting: Lab

## 2011-12-31 ENCOUNTER — Ambulatory Visit: Payer: PRIVATE HEALTH INSURANCE | Admitting: Internal Medicine

## 2011-12-31 VITALS — BP 95/60 | HR 91 | Temp 98.5°F | Resp 18

## 2011-12-31 VITALS — BP 100/63 | HR 102 | Temp 98.8°F | Resp 18 | Wt 107.1 lb

## 2011-12-31 DIAGNOSIS — C3410 Malignant neoplasm of upper lobe, unspecified bronchus or lung: Secondary | ICD-10-CM

## 2011-12-31 DIAGNOSIS — Z5111 Encounter for antineoplastic chemotherapy: Secondary | ICD-10-CM

## 2011-12-31 DIAGNOSIS — C3490 Malignant neoplasm of unspecified part of unspecified bronchus or lung: Secondary | ICD-10-CM

## 2011-12-31 LAB — CBC WITH DIFFERENTIAL/PLATELET
BASO%: 0.4 % (ref 0.0–2.0)
EOS%: 1.5 % (ref 0.0–7.0)
HCT: 32.1 % — ABNORMAL LOW (ref 34.8–46.6)
MCH: 27.7 pg (ref 25.1–34.0)
MCHC: 31.5 g/dL (ref 31.5–36.0)
NEUT%: 81 % — ABNORMAL HIGH (ref 38.4–76.8)
RDW: 16.5 % — ABNORMAL HIGH (ref 11.2–14.5)
lymph#: 1.6 10*3/uL (ref 0.9–3.3)

## 2011-12-31 LAB — COMPREHENSIVE METABOLIC PANEL (CC13)
AST: 8 U/L (ref 5–34)
Albumin: 2.4 g/dL — ABNORMAL LOW (ref 3.5–5.0)
Alkaline Phosphatase: 110 U/L (ref 40–150)
BUN: 17 mg/dL (ref 7.0–26.0)
Potassium: 3.9 mEq/L (ref 3.5–5.1)

## 2011-12-31 MED ORDER — SODIUM CHLORIDE 0.9 % IV SOLN
Freq: Once | INTRAVENOUS | Status: AC
  Administered 2011-12-31: 13:00:00 via INTRAVENOUS

## 2011-12-31 MED ORDER — DIPHENHYDRAMINE HCL 50 MG/ML IJ SOLN
50.0000 mg | Freq: Once | INTRAMUSCULAR | Status: AC
  Administered 2011-12-31: 50 mg via INTRAVENOUS

## 2011-12-31 MED ORDER — FAMOTIDINE IN NACL 20-0.9 MG/50ML-% IV SOLN
20.0000 mg | Freq: Once | INTRAVENOUS | Status: AC
  Administered 2011-12-31: 20 mg via INTRAVENOUS

## 2011-12-31 MED ORDER — DEXAMETHASONE SODIUM PHOSPHATE 4 MG/ML IJ SOLN
20.0000 mg | Freq: Once | INTRAMUSCULAR | Status: AC
  Administered 2011-12-31: 20 mg via INTRAVENOUS

## 2011-12-31 MED ORDER — ONDANSETRON 16 MG/50ML IVPB (CHCC)
16.0000 mg | Freq: Once | INTRAVENOUS | Status: AC
  Administered 2011-12-31: 16 mg via INTRAVENOUS

## 2011-12-31 MED ORDER — SODIUM CHLORIDE 0.9 % IV SOLN
183.4000 mg | Freq: Once | INTRAVENOUS | Status: AC
  Administered 2011-12-31: 180 mg via INTRAVENOUS
  Filled 2011-12-31: qty 18

## 2011-12-31 MED ORDER — PACLITAXEL CHEMO INJECTION 300 MG/50ML
45.0000 mg/m2 | Freq: Once | INTRAVENOUS | Status: AC
  Administered 2011-12-31: 66 mg via INTRAVENOUS
  Filled 2011-12-31: qty 11

## 2011-12-31 NOTE — Patient Instructions (Signed)
[facility] Discharge Instructions for Patients Receiving Chemotherapy  Today you received the following chemotherapy agents Taxol, Carboplatin To help prevent nausea and vomiting after your treatment, we encourage you to take your nausea medication as directed by MD.  If you develop nausea and vomiting that is not controlled by your nausea medication, call the clinic. If it is after clinic hours your family physician or the after hours number for the clinic or go to the Emergency Department.   BELOW ARE SYMPTOMS THAT SHOULD BE REPORTED IMMEDIATELY:  *FEVER GREATER THAN 100.5 F  *CHILLS WITH OR WITHOUT FEVER  NAUSEA AND VOMITING THAT IS NOT CONTROLLED WITH YOUR NAUSEA MEDICATION  *UNUSUAL SHORTNESS OF BREATH  *UNUSUAL BRUISING OR BLEEDING  TENDERNESS IN MOUTH AND THROAT WITH OR WITHOUT PRESENCE OF ULCERS  *URINARY PROBLEMS  *BOWEL PROBLEMS  UNUSUAL RASH Items with * indicate a potential emergency and should be followed up as soon as possible.  One of the nurses will contact you 24 hours after your treatment. Please let the nurse know about any problems that you may have experienced. Feel free to call the clinic you have any questions or concerns. The clinic phone number is [phone].   I have been informed and understand all the instructions given to me. I know to contact the clinic, my physician, or go to the Emergency Department if any problems should occur. I do not have any questions at this time, but understand that I may call the clinic during office hours   should I have any questions or need assistance in obtaining follow up care.    __________________________________________  _____________  __________ Signature of Patient or Authorized Representative            Date                   Time    __________________________________________ Nurse's Signature      Carboplatin injection What is this medicine? CARBOPLATIN (KAR boe pla tin) is a  chemotherapy drug. It targets fast dividing cells, like cancer cells, and causes these cells to die. This medicine is used to treat ovarian cancer and many other cancers. This medicine may be used for other purposes; ask your health care provider or pharmacist if you have questions. What should I tell my health care provider before I take this medicine? They need to know if you have any of these conditions: -blood disorders -hearing problems -kidney disease -recent or ongoing radiation therapy -an unusual or allergic reaction to carboplatin, cisplatin, other chemotherapy, other medicines, foods, dyes, or preservatives -pregnant or trying to get pregnant -breast-feeding How should I use this medicine? This drug is usually given as an infusion into a vein. It is administered in a hospital or clinic by a specially trained health care professional. Talk to your pediatrician regarding the use of this medicine in children. Special care may be needed. Overdosage: If you think you have taken too much of this medicine contact a poison control center or emergency room at once. NOTE: This medicine is only for you. Do not share this medicine with others. What if I miss a dose? It is important not to miss a dose. Call your doctor or health care professional if you are unable to keep an appointment. What may interact with this medicine? -medicines for seizures -medicines to increase blood counts like filgrastim, pegfilgrastim, sargramostim -some antibiotics like amikacin, gentamicin, neomycin, streptomycin, tobramycin -vaccines Talk to your doctor or health care professional  before taking any of these medicines: -acetaminophen -aspirin -ibuprofen -ketoprofen -naproxen This list may not describe all possible interactions. Give your health care provider a list of all the medicines, herbs, non-prescription drugs, or dietary supplements you use. Also tell them if you smoke, drink alcohol, or use illegal  drugs. Some items may interact with your medicine. What should I watch for while using this medicine? Your condition will be monitored carefully while you are receiving this medicine. You will need important blood work done while you are taking this medicine. This drug may make you feel generally unwell. This is not uncommon, as chemotherapy can affect healthy cells as well as cancer cells. Report any side effects. Continue your course of treatment even though you feel ill unless your doctor tells you to stop. In some cases, you may be given additional medicines to help with side effects. Follow all directions for their use. Call your doctor or health care professional for advice if you get a fever, chills or sore throat, or other symptoms of a cold or flu. Do not treat yourself. This drug decreases your body's ability to fight infections. Try to avoid being around people who are sick. This medicine may increase your risk to bruise or bleed. Call your doctor or health care professional if you notice any unusual bleeding. Be careful brushing and flossing your teeth or using a toothpick because you may get an infection or bleed more easily. If you have any dental work done, tell your dentist you are receiving this medicine. Avoid taking products that contain aspirin, acetaminophen, ibuprofen, naproxen, or ketoprofen unless instructed by your doctor. These medicines may hide a fever. Do not become pregnant while taking this medicine. Women should inform their doctor if they wish to become pregnant or think they might be pregnant. There is a potential for serious side effects to an unborn child. Talk to your health care professional or pharmacist for more information. Do not breast-feed an infant while taking this medicine. What side effects may I notice from receiving this medicine? Side effects that you should report to your doctor or health care professional as soon as possible: -allergic reactions like  skin rash, itching or hives, swelling of the face, lips, or tongue -signs of infection - fever or chills, cough, sore throat, pain or difficulty passing urine -signs of decreased platelets or bleeding - bruising, pinpoint red spots on the skin, black, tarry stools, nosebleeds -signs of decreased red blood cells - unusually weak or tired, fainting spells, lightheadedness -breathing problems -changes in hearing -changes in vision -chest pain -high blood pressure -low blood counts - This drug may decrease the number of white blood cells, red blood cells and platelets. You may be at increased risk for infections and bleeding. -nausea and vomiting -pain, swelling, redness or irritation at the injection site -pain, tingling, numbness in the hands or feet -problems with balance, talking, walking -trouble passing urine or change in the amount of urine Side effects that usually do not require medical attention (report to your doctor or health care professional if they continue or are bothersome): -hair loss -loss of appetite -metallic taste in the mouth or changes in taste This list may not describe all possible side effects. Call your doctor for medical advice about side effects. You may report side effects to FDA at 1-800-FDA-1088. Where should I keep my medicine? This drug is given in a hospital or clinic and will not be stored at home. NOTE: This sheet is a  summary. It may not cover all possible information. If you have questions about this medicine, talk to your doctor, pharmacist, or health care provider.  2012, Elsevier/Gold Standard. (05/27/2007 2:38:05 PM)     Paclitaxel injection What is this medicine? PACLITAXEL (PAK li TAX el) is a chemotherapy drug. It targets fast dividing cells, like cancer cells, and causes these cells to die. This medicine is used to treat ovarian cancer, breast cancer, and other cancers. This medicine may be used for other purposes; ask your health care  provider or pharmacist if you have questions. What should I tell my health care provider before I take this medicine? They need to know if you have any of these conditions: -blood disorders -irregular heartbeat -infection (especially a virus infection such as chickenpox, cold sores, or herpes) -liver disease -previous or ongoing radiation therapy -an unusual or allergic reaction to paclitaxel, alcohol, polyoxyethylated castor oil, other chemotherapy agents, other medicines, foods, dyes, or preservatives -pregnant or trying to get pregnant -breast-feeding How should I use this medicine? This drug is given as an infusion into a vein. It is administered in a hospital or clinic by a specially trained health care professional. Talk to your pediatrician regarding the use of this medicine in children. Special care may be needed. Overdosage: If you think you have taken too much of this medicine contact a poison control center or emergency room at once. NOTE: This medicine is only for you. Do not share this medicine with others. What if I miss a dose? It is important not to miss your dose. Call your doctor or health care professional if you are unable to keep an appointment. What may interact with this medicine? Do not take this medicine with any of the following medications: -disulfiram -metronidazole This medicine may also interact with the following medications: -cyclosporine -dexamethasone -diazepam -ketoconazole -medicines to increase blood counts like filgrastim, pegfilgrastim, sargramostim -other chemotherapy drugs like cisplatin, doxorubicin, epirubicin, etoposide, teniposide, vincristine -quinidine -testosterone -vaccines -verapamil Talk to your doctor or health care professional before taking any of these medicines: -acetaminophen -aspirin -ibuprofen -ketoprofen -naproxen This list may not describe all possible interactions. Give your health care provider a list of all the  medicines, herbs, non-prescription drugs, or dietary supplements you use. Also tell them if you smoke, drink alcohol, or use illegal drugs. Some items may interact with your medicine. What should I watch for while using this medicine? Your condition will be monitored carefully while you are receiving this medicine. You will need important blood work done while you are taking this medicine. This drug may make you feel generally unwell. This is not uncommon, as chemotherapy can affect healthy cells as well as cancer cells. Report any side effects. Continue your course of treatment even though you feel ill unless your doctor tells you to stop. In some cases, you may be given additional medicines to help with side effects. Follow all directions for their use. Call your doctor or health care professional for advice if you get a fever, chills or sore throat, or other symptoms of a cold or flu. Do not treat yourself. This drug decreases your body's ability to fight infections. Try to avoid being around people who are sick. This medicine may increase your risk to bruise or bleed. Call your doctor or health care professional if you notice any unusual bleeding. Be careful brushing and flossing your teeth or using a toothpick because you may get an infection or bleed more easily. If you have any dental  work done, tell your dentist you are receiving this medicine. Avoid taking products that contain aspirin, acetaminophen, ibuprofen, naproxen, or ketoprofen unless instructed by your doctor. These medicines may hide a fever. Do not become pregnant while taking this medicine. Women should inform their doctor if they wish to become pregnant or think they might be pregnant. There is a potential for serious side effects to an unborn child. Talk to your health care professional or pharmacist for more information. Do not breast-feed an infant while taking this medicine. Men are advised not to father a child while receiving  this medicine. What side effects may I notice from receiving this medicine? Side effects that you should report to your doctor or health care professional as soon as possible: -allergic reactions like skin rash, itching or hives, swelling of the face, lips, or tongue -low blood counts - This drug may decrease the number of white blood cells, red blood cells and platelets. You may be at increased risk for infections and bleeding. -signs of infection - fever or chills, cough, sore throat, pain or difficulty passing urine -signs of decreased platelets or bleeding - bruising, pinpoint red spots on the skin, black, tarry stools, nosebleeds -signs of decreased red blood cells - unusually weak or tired, fainting spells, lightheadedness -breathing problems -chest pain -high or low blood pressure -mouth sores -nausea and vomiting -pain, swelling, redness or irritation at the injection site -pain, tingling, numbness in the hands or feet -slow or irregular heartbeat -swelling of the ankle, feet, hands Side effects that usually do not require medical attention (report to your doctor or health care professional if they continue or are bothersome): -bone pain -complete hair loss including hair on your head, underarms, pubic hair, eyebrows, and eyelashes -changes in the color of fingernails -diarrhea -loosening of the fingernails -loss of appetite -muscle or joint pain -red flush to skin -sweating This list may not describe all possible side effects. Call your doctor for medical advice about side effects. You may report side effects to FDA at 1-800-FDA-1088. Where should I keep my medicine? This drug is given in a hospital or clinic and will not be stored at home. NOTE: This sheet is a summary. It may not cover all possible information. If you have questions about this medicine, talk to your doctor, pharmacist, or health care provider.  2012, Elsevier/Gold Standard. (02/02/2008 11:54:26 AM)

## 2012-01-01 ENCOUNTER — Ambulatory Visit: Payer: PRIVATE HEALTH INSURANCE

## 2012-01-01 ENCOUNTER — Ambulatory Visit
Admission: RE | Admit: 2012-01-01 | Discharge: 2012-01-01 | Disposition: A | Discharge status: Home / Self Care | Payer: PRIVATE HEALTH INSURANCE | Source: Ambulatory Visit | Attending: Radiation Oncology | Admitting: Radiation Oncology

## 2012-01-01 ENCOUNTER — Telehealth: Payer: Self-pay | Admitting: *Deleted

## 2012-01-01 NOTE — Telephone Encounter (Signed)
error 

## 2012-01-01 NOTE — Telephone Encounter (Signed)
Attending MD statement signed by Dr Donnald Garre and returned to Axel Filler in medical mgmt.  SLJ

## 2012-01-01 NOTE — Progress Notes (Signed)
No visit was done

## 2012-01-02 ENCOUNTER — Ambulatory Visit: Payer: PRIVATE HEALTH INSURANCE

## 2012-01-02 ENCOUNTER — Ambulatory Visit: Payer: PRIVATE HEALTH INSURANCE | Admitting: Nutrition

## 2012-01-02 ENCOUNTER — Ambulatory Visit
Admission: RE | Admit: 2012-01-02 | Discharge: 2012-01-02 | Disposition: A | Discharge status: Home / Self Care | Payer: PRIVATE HEALTH INSURANCE | Source: Ambulatory Visit | Attending: Radiation Oncology | Admitting: Radiation Oncology

## 2012-01-02 NOTE — Progress Notes (Signed)
This is a 61 year old female patient of Dr. 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  Past medical history includes anemia, tobacco usage, and rectovaginal fistula.  Medications include Os-Cal with vitamin D, Decadron, vitamin E, and glucosamine/chondroitin.  Labs were reviewed.  Height: 62 inches. Weight: 107.1 pounds October 28. Usual body weight 123 pounds May of 2013. BMI 19.58.  Patient denies difficulty eating.  She denies nausea, vomiting, trouble chewing and swallowing, diarrhea, or constipation. Daily recall reveals patient does not eat regular meals. She does tend to eat out more often. She eats her bigger meal in the middle of the day and it's usually fast food. She does not verbalize any nutrition concerns.  Nutrition diagnosis: Unintended weight loss related to metastatic lung cancer and associated treatments as evidenced by 13% weight loss from usual body weight.  Intervention: I educated patient to increase calories and protein in small amounts throughout the day. I have encouraged her to increase ensure or boost plus or milkshakes to 2 times a day or 3 times a day. I've encouraged her to consume these between her meals. I have educated her on sources of protein. I educated her on strategies for eating with a sore throat. I provided fact sheets and nutrition samples for her to take with her today.  Monitoring, evaluation, and goals: Patient will tolerate oral intake to minimize further weight loss.  Next visit: Patient has my contact information and prefers to call me for followup.

## 2012-01-03 ENCOUNTER — Ambulatory Visit
Admission: RE | Admit: 2012-01-03 | Discharge: 2012-01-03 | Disposition: A | Discharge status: Home / Self Care | Payer: PRIVATE HEALTH INSURANCE | Source: Ambulatory Visit | Attending: Radiation Oncology | Admitting: Radiation Oncology

## 2012-01-03 ENCOUNTER — Ambulatory Visit: Payer: PRIVATE HEALTH INSURANCE

## 2012-01-03 ENCOUNTER — Encounter (INDEPENDENT_AMBULATORY_CARE_PROVIDER_SITE_OTHER): Payer: PRIVATE HEALTH INSURANCE | Admitting: Ophthalmology

## 2012-01-03 DIAGNOSIS — H3530 Unspecified macular degeneration: Secondary | ICD-10-CM

## 2012-01-03 DIAGNOSIS — H33009 Unspecified retinal detachment with retinal break, unspecified eye: Secondary | ICD-10-CM

## 2012-01-03 DIAGNOSIS — H2510 Age-related nuclear cataract, unspecified eye: Secondary | ICD-10-CM

## 2012-01-03 DIAGNOSIS — H353290 Exudative age-related macular degeneration, unspecified eye, stage unspecified: Secondary | ICD-10-CM

## 2012-01-03 DIAGNOSIS — H43819 Vitreous degeneration, unspecified eye: Secondary | ICD-10-CM

## 2012-01-04 ENCOUNTER — Encounter: Payer: Self-pay | Admitting: Radiation Oncology

## 2012-01-04 ENCOUNTER — Ambulatory Visit: Payer: PRIVATE HEALTH INSURANCE

## 2012-01-04 ENCOUNTER — Ambulatory Visit
Admission: RE | Admit: 2012-01-04 | Discharge: 2012-01-04 | Disposition: A | Discharge status: Home / Self Care | Payer: PRIVATE HEALTH INSURANCE | Source: Ambulatory Visit | Attending: Radiation Oncology | Admitting: Radiation Oncology

## 2012-01-04 ENCOUNTER — Telehealth: Payer: Self-pay | Admitting: Radiation Oncology

## 2012-01-04 VITALS — BP 84/61 | HR 113 | Temp 98.3°F | Resp 20 | Wt 103.4 lb

## 2012-01-04 DIAGNOSIS — C7931 Secondary malignant neoplasm of brain: Secondary | ICD-10-CM

## 2012-01-04 DIAGNOSIS — R918 Other nonspecific abnormal finding of lung field: Secondary | ICD-10-CM

## 2012-01-04 MED ORDER — SUCRALFATE 1 G PO TABS: 1.0000 g | ORAL_TABLET | Freq: Four times a day (QID) | ORAL | Status: DC

## 2012-01-04 NOTE — Telephone Encounter (Signed)
Pt here today in regards to financial and gas assistance. Pt was given an EPP and MCD application, as well as, [facility], [facility] and [facility] referrals. Pt has also been out of work since 12/21/2011 and advised her sister Cydney Ok) has been assistanting  her with addl finances, if need be. She also advised her short-term disability check will come sometime next week.   Dx: 162.9 Lung, NOS  Attending Rad: Dr. Kathrynn Running

## 2012-01-04 NOTE — Progress Notes (Signed)
  Radiation Oncology         [phone] ________________________________  Name: Melanie Liu MRN: [id number]  Date: 01/04/2012  DOB: 30-Mar-1950  Weekly Radiation Therapy Management  Current Dose: 18 Gy     Planned Dose:  66 Gy  Narrative . . . . . . . . The patient presents for routine under treatment assessment.                                               Patient here weekly rad txs lung:9/33 completd, alert,oriented x3, took ortho vitals, sitting b/p=82/60,p=110, standing b/p=84/61,p=113, rr-20, 100% room air sats, no nausea, m=dry cough, takes 4mg  decadron daily po, no thrush seen in mouth/tongue, taste changes now states patient, slight sore throat, slight fatigue, drinks plenty water States patient                                 Set-up films were reviewed.                                 The chart was checked. Physical Findings. . .  weight is 103 lb 6.4 oz (46.902 kg). Her oral temperature is 98.3 F (36.8 C). Her blood pressure is 84/61 and her pulse is 113. Her respiration is 20 and oxygen saturation is 100%. . Weight essentially stable.  No significant changes. Impression . . . . . . . The patient is  tolerating radiation. Plan . . . . . . . . . . . . Continue treatment as planned.  ________________________________  Artist Pais. Kathrynn Running, M.D.

## 2012-01-04 NOTE — Progress Notes (Signed)
Patient here weekly rad txs lung:9/33 completd, alert,oriented x3,  took ortho vitals, sitting b/p=82/60,p=110, standing b/p=84/61,p=113, rr-20, 100% room air sats, no nausea, m=dry cough, takes 4mg  decadron daily po, no thrush seen in mouth/tongue, taste changes now states patient, slight sore throat, slight fatigue, drinks plenty water  States patient 9:49 AM

## 2012-01-07 ENCOUNTER — Telehealth: Payer: Self-pay | Admitting: *Deleted

## 2012-01-07 ENCOUNTER — Ambulatory Visit (HOSPITAL_BASED_OUTPATIENT_CLINIC_OR_DEPARTMENT_OTHER): Payer: PRIVATE HEALTH INSURANCE | Admitting: Physician Assistant

## 2012-01-07 ENCOUNTER — Other Ambulatory Visit (HOSPITAL_BASED_OUTPATIENT_CLINIC_OR_DEPARTMENT_OTHER): Payer: PRIVATE HEALTH INSURANCE | Admitting: Lab

## 2012-01-07 ENCOUNTER — Telehealth: Payer: Self-pay | Admitting: Radiation Oncology

## 2012-01-07 ENCOUNTER — Ambulatory Visit
Admission: RE | Admit: 2012-01-07 | Discharge: 2012-01-07 | Disposition: A | Discharge status: Home / Self Care | Payer: PRIVATE HEALTH INSURANCE | Source: Ambulatory Visit | Attending: Radiation Oncology | Admitting: Radiation Oncology

## 2012-01-07 ENCOUNTER — Encounter: Payer: Self-pay | Admitting: Physician Assistant

## 2012-01-07 ENCOUNTER — Encounter: Payer: Self-pay | Admitting: Internal Medicine

## 2012-01-07 ENCOUNTER — Ambulatory Visit: Payer: PRIVATE HEALTH INSURANCE

## 2012-01-07 ENCOUNTER — Ambulatory Visit (HOSPITAL_BASED_OUTPATIENT_CLINIC_OR_DEPARTMENT_OTHER): Payer: PRIVATE HEALTH INSURANCE

## 2012-01-07 VITALS — BP 94/67 | HR 111 | Temp 95.9°F | Resp 20 | Ht 62.0 in | Wt 103.7 lb

## 2012-01-07 DIAGNOSIS — C7949 Secondary malignant neoplasm of other parts of nervous system: Secondary | ICD-10-CM

## 2012-01-07 DIAGNOSIS — C7931 Secondary malignant neoplasm of brain: Secondary | ICD-10-CM

## 2012-01-07 DIAGNOSIS — C3490 Malignant neoplasm of unspecified part of unspecified bronchus or lung: Secondary | ICD-10-CM

## 2012-01-07 DIAGNOSIS — Z5111 Encounter for antineoplastic chemotherapy: Secondary | ICD-10-CM

## 2012-01-07 DIAGNOSIS — C3410 Malignant neoplasm of upper lobe, unspecified bronchus or lung: Secondary | ICD-10-CM

## 2012-01-07 LAB — CBC WITH DIFFERENTIAL/PLATELET
Basophils Absolute: 0 10*3/uL (ref 0.0–0.1)
EOS%: 0.5 % (ref 0.0–7.0)
HCT: 31.9 % — ABNORMAL LOW (ref 34.8–46.6)
HGB: 10.1 g/dL — ABNORMAL LOW (ref 11.6–15.9)
LYMPH%: 9 % — ABNORMAL LOW (ref 14.0–49.7)
MCH: 27.9 pg (ref 25.1–34.0)
MCV: 88.1 fL (ref 79.5–101.0)
MONO%: 9.7 % (ref 0.0–14.0)
NEUT%: 80.6 % — ABNORMAL HIGH (ref 38.4–76.8)
Platelets: 504 10*3/uL — ABNORMAL HIGH (ref 145–400)

## 2012-01-07 LAB — COMPREHENSIVE METABOLIC PANEL (CC13)
Alkaline Phosphatase: 79 U/L (ref 40–150)
BUN: 22 mg/dL (ref 7.0–26.0)
Glucose: 127 mg/dl — ABNORMAL HIGH (ref 70–99)
Total Bilirubin: 0.22 mg/dL (ref 0.20–1.20)

## 2012-01-07 MED ORDER — SODIUM CHLORIDE 0.9 % IV SOLN
Freq: Once | INTRAVENOUS | Status: AC
  Administered 2012-01-07: 13:00:00 via INTRAVENOUS

## 2012-01-07 MED ORDER — SODIUM CHLORIDE 0.9 % IJ SOLN
10.0000 mL | INTRAMUSCULAR | Status: DC | PRN
  Filled 2012-01-07: qty 10

## 2012-01-07 MED ORDER — DEXAMETHASONE SODIUM PHOSPHATE 4 MG/ML IJ SOLN
20.0000 mg | Freq: Once | INTRAMUSCULAR | Status: AC
  Administered 2012-01-07: 20 mg via INTRAVENOUS

## 2012-01-07 MED ORDER — DIPHENHYDRAMINE HCL 50 MG/ML IJ SOLN
50.0000 mg | Freq: Once | INTRAMUSCULAR | Status: AC
  Administered 2012-01-07: 50 mg via INTRAVENOUS

## 2012-01-07 MED ORDER — PACLITAXEL CHEMO INJECTION 300 MG/50ML
45.0000 mg/m2 | Freq: Once | INTRAVENOUS | Status: AC
  Administered 2012-01-07: 66 mg via INTRAVENOUS
  Filled 2012-01-07: qty 11

## 2012-01-07 MED ORDER — ONDANSETRON 16 MG/50ML IVPB (CHCC)
16.0000 mg | Freq: Once | INTRAVENOUS | Status: AC
  Administered 2012-01-07: 16 mg via INTRAVENOUS

## 2012-01-07 MED ORDER — SODIUM CHLORIDE 0.9 % IV SOLN
183.4000 mg | Freq: Once | INTRAVENOUS | Status: AC
  Administered 2012-01-07: 180 mg via INTRAVENOUS
  Filled 2012-01-07: qty 18

## 2012-01-07 MED ORDER — HEPARIN SOD (PORK) LOCK FLUSH 100 UNIT/ML IV SOLN
500.0000 [IU] | Freq: Once | INTRAVENOUS | Status: DC | PRN
  Filled 2012-01-07: qty 5

## 2012-01-07 MED ORDER — FAMOTIDINE IN NACL 20-0.9 MG/50ML-% IV SOLN
20.0000 mg | Freq: Once | INTRAVENOUS | Status: AC
  Administered 2012-01-07: 20 mg via INTRAVENOUS

## 2012-01-07 NOTE — Progress Notes (Signed)
Put fmla and [employer] disability forms in Child psychotherapist.

## 2012-01-07 NOTE — Patient Instructions (Addendum)
Continue your course of concurrent chemotherapy and radiation therapy as scheduled Followup in 3 weeks

## 2012-01-07 NOTE — Progress Notes (Deleted)
No visit was done

## 2012-01-07 NOTE — Progress Notes (Signed)
No images are attached to the encounter. No scans are attached to the encounter. No scans are attached to the encounter. [facility] OFFICE PROGRESS NOTE  [name], MD [address] [place] [address]  DIAGNOSIS: Metastatic non-small cell lung cancer, adenocarcinoma with a single brain lesion  PRIOR THERAPY: Status post stereotactic radiosurgery to the single brain lesion under the care Dr. Kathrynn Running  CURRENT THERAPY: Concurrent chemoradiation with chemotherapy in the form of weekly carboplatin for an AUC of 2 and paclitaxel at 45 mg per meter squared concurrent with radiation therapy under the care Dr. Kathrynn Running  INTERVAL HISTORY: Melanie Liu 61 y.o. female returns for a scheduled regular symptom management visit for followup of her metastatic locally advanced disease within the chest, non-small cell lung cancer, adenocarcinoma. She completed her SRS without difficulty. Thus far she is tolerating concurrent chemoradiation. She has a little trouble swallowing but otherwise voiced no specific complaints. She denied chest pain, shortness of breath, fever or chills. She's had no night sweats or significant weight loss.   MEDICAL HISTORY: Past Medical History  Diagnosis Date  . Nodule of right lung     right upper lobe  . Anemia   . Rectovaginal fistula   . Lung cancer 11/23/11    biopsy-right  . Right frontal lobe lesion 12/13/11    CT head    ALLERGIES:  is allergic to levaquin.  MEDICATIONS:  Current Outpatient Prescriptions  Medication Sig Dispense Refill  . calcium-vitamin D (OSCAL WITH D) 500-200 MG-UNIT per tablet Take 1 tablet by mouth daily.      [name] dexamethasone (DECADRON) 4 MG tablet Take 0.5 tablets (2 mg total) by mouth 2 (two) times daily with a meal.  30 tablet  5  . glucosamine-chondroitin 500-400 MG tablet Take 1 tablet by mouth daily.       . hyaluronate sodium (RADIAPLEXRX) GEL Apply topically 2 (two) times daily.      [name]  oxyCODONE-acetaminophen (PERCOCET/ROXICET) 5-325 MG per tablet Take 1-2 tablets by mouth every 4 (four) hours as needed for pain.  90 tablet  0  . PRESCRIPTION MEDICATION Place into the left eye every 30 (thirty) days. Bevacizumab (Avastin) inj into the left eye      . sucralfate (CARAFATE) 1 G tablet Take 1 tablet (1 g total) by mouth 4 (four) times daily. 5 min before food  90 tablet  5  . vitamin E 400 UNIT capsule Take 400 Units by mouth daily.       No current facility-administered medications for this visit.   Facility-Administered Medications Ordered in Other Visits  Medication Dose Route Frequency Provider Last Rate Last Dose  . [COMPLETED] 0.9 %  sodium chloride infusion   Intravenous Once [name], MD      . [COMPLETED] CARBOplatin (PARAPLATIN) 180 mg in sodium chloride 0.9 % 100 mL chemo infusion  180 mg Intravenous Once [name], MD   180 mg at 01/07/12 1538  . [COMPLETED] dexamethasone (DECADRON) injection 20 mg  20 mg Intravenous Once [name], MD   20 mg at 01/07/12 1321  . [COMPLETED] diphenhydrAMINE (BENADRYL) injection 50 mg  50 mg Intravenous Once [name], MD   50 mg at 01/07/12 1321  . [COMPLETED] famotidine (PEPCID) IVPB 20 mg  20 mg Intravenous Once [name], MD   20 mg at 01/07/12 1339  . heparin lock flush 100 unit/mL  500 Units Intracatheter Once PRN [name], MD      . [address]  ondansetron (ZOFRAN) IVPB 16 mg  16 mg Intravenous Once [name], MD   16 mg at 01/07/12 1321  . [COMPLETED] PACLitaxel (TAXOL) 66 mg in dextrose 5 % 250 mL chemo infusion (</= 80mg /m2)  45 mg/m2 (Treatment Plan Actual) Intravenous Once [name], MD   66 mg at 01/07/12 1423  . sodium chloride 0.9 % injection 10 mL  10 mL Intracatheter PRN [name], MD        SURGICAL HISTORY:  Past Surgical History  Procedure Date  . Partial hysterectomy   . Hemmorhoid surgery   . Neck surgery   . Retinal detachment surgery     REVIEW OF  SYSTEMS:  A comprehensive review of systems was negative except for: Gastrointestinal: positive for dysphagia   PHYSICAL EXAMINATION: General appearance: alert, cooperative, appears stated age and no distress Head: Normocephalic, without obvious abnormality, atraumatic Neck: no adenopathy, no carotid bruit, no JVD, supple, symmetrical, trachea midline and thyroid not enlarged, symmetric, no tenderness/mass/nodules Lymph nodes: Cervical, supraclavicular, and axillary nodes normal. Resp: clear to auscultation bilaterally Cardio: regular rate and rhythm, S1, S2 normal, no murmur, click, rub or gallop GI: soft, non-tender; bowel sounds normal; no masses,  no organomegaly Extremities: extremities normal, atraumatic, no cyanosis or edema Neurologic: Alert and oriented X 3, normal strength and tone. Normal symmetric reflexes. Normal coordination and gait  ECOG PERFORMANCE STATUS: 1 - Symptomatic but completely ambulatory  Blood pressure 94/67, pulse 111, temperature 95.9 F (35.5 C), temperature source Oral, resp. rate 20, height 5\' 2"  (1.575 m), weight 103 lb 11.2 oz (47.038 kg).  LABORATORY DATA: Lab Results  Component Value Date   WBC 13.2* 01/07/2012   HGB 10.1* 01/07/2012   HCT 31.9* 01/07/2012   MCV 88.1 01/07/2012   PLT 504* 01/07/2012      Chemistry      Component Value Date/Time   NA 137 01/07/2012 1138   K 4.1 01/07/2012 1138   CL 105 01/07/2012 1138   CO2 24 01/07/2012 1138   BUN 22.0 01/07/2012 1138   CREATININE 0.9 01/07/2012 1138      Component Value Date/Time   CALCIUM 9.2 01/07/2012 1138   ALKPHOS 79 01/07/2012 1138   AST 6 01/07/2012 1138   ALT 7 01/07/2012 1138   BILITOT 0.22 01/07/2012 1138       RADIOGRAPHIC STUDIES:  Ct Head W Wo Contrast  Dec 28, 2011  *RADIOLOGY REPORT*  Clinical Data: Lung cancer.  Dizziness.  CT HEAD WITHOUT AND WITH CONTRAST  Technique:  Contiguous axial images were obtained from the base of the skull through the vertex without and with intravenous  contrast.  Contrast: 75mL OMNIPAQUE IOHEXOL 300 MG/ML  SOLN  Comparison: None.  Findings: A 18 x 17 mm ring enhancing mass lesion is present in the right frontal lobe.  There is significant surrounding vasogenic edema with partial effacement frontal horn of the right lateral ventricle.  Mild subfalcine herniation is present.  There is effacement of the sulci within the anterior right frontal lobe.  The postcontrast images demonstrate no other foci of pathologic enhancement to suggest additional metastases.  No acute cortical infarct or hemorrhage is present.  The ventricles are otherwise normal size.  No significant extra-axial fluid collection is present.  IMPRESSION:  1.  17 x 18 mm peripherally enhancing mass lesion in the right frontal lobe is most compatible with a focal metastasis of the patient's known lung cancer. 2.  Extensive surrounding vasogenic edema with mass effect as described.  Critical Value/emergent results  were called by telephone at the time of interpretation on 12/13/2011 at 04:35 p.m. to Dr. Tyrone Sage, who verbally acknowledged these results.   Original Report Authenticated By: Jamesetta Orleans. MATTERN, M.D.    Mr [name] Wo Contrast  12/21/2011  *RADIOLOGY REPORT*  Clinical Data: Lung cancer with intracranial metastatic disease. Stereotactic radiation therapy treatment planning exam.  MRI HEAD WITHOUT AND WITH CONTRAST  Technique:  Multiplanar, multiecho pulse sequences of the brain and surrounding structures were obtained according to standard protocol without and with intravenous contrast  Contrast: 10mL MULTIHANCE GADOBENATE DIMEGLUMINE 529 MG/ML IV SOLN  Comparison: 12/13/2011 head CT.  No comparison MR.  Findings: No acute infarct.  No intracranial hemorrhage.  Solitary anterior right frontal lobe ring enhancing 1.9 x 1.9 x 2 cm mass with prominent amount of surrounding vasogenic edema and local mass effect upon the right lateral ventricle which is displaced slightly posteriorly and  minimal bowing of the anterior aspect of the septum to the left.  As patient has known primary lung malignancy this may represent a solitary metastatic lesion. Primary brain tumor not entirely excluded.  No restricted motion within the central aspect to suggest abscess.  Atrophy without hydrocephalus.  Major intracranial vascular structures are patent.  IMPRESSION: Solitary ring enhancing lesion right frontal lobe with surrounding vasogenic edema and mass effect as detailed above.   Original Report Authenticated By: Fuller Canada, M.D.      ASSESSMENT/PLAN: Patient is a very pleasant 61 year old white female with metastatic non-small cell lung cancer, adenocarcinoma with a single brain lesion now status post SRS under the care of Dr. Kathrynn Running. [name] had a discussion with Dr. Kathrynn Running and it was decided to continue with concurrent chemoradiation for her locally advanced disease within her chest. She will continue with concurrent chemoradiation as scheduled and followup in 3 weeks for another symptom management visit with a repeat CBC differential and C. met.     Laural Benes, Stephen Turnbaugh E, PA-C     All questions were answered. The patient knows to call the clinic with any problems, questions or concerns. We can certainly see the patient much sooner if necessary.  I spent 20 minutes counseling the patient face to face. The total time spent in the appointment was 30 minutes.

## 2012-01-07 NOTE — Telephone Encounter (Signed)
Indigent Approved 100% Family Size: 1 HH INC: 271.43 MOD POV: 11,490.00 Valid Dates:01/07/2012 - 07/06/2012  100% INDIGENT - PLEASE APPLY DISCOUNT TO ANY PRIOR AND ALL CURRENT BILL DOS.  [facility] $400  MCD appl mailed to [facility] today.

## 2012-01-07 NOTE — Patient Instructions (Signed)
Patient aware of next appointment; discharged home with no complaints. 

## 2012-01-07 NOTE — Telephone Encounter (Signed)
Add on midlevel appointment for 01-28-2012   [facility] email to adjust patient's treatment on that day also

## 2012-01-08 ENCOUNTER — Ambulatory Visit: Payer: PRIVATE HEALTH INSURANCE

## 2012-01-08 ENCOUNTER — Ambulatory Visit
Admission: RE | Admit: 2012-01-08 | Discharge: 2012-01-08 | Disposition: A | Discharge status: Home / Self Care | Payer: PRIVATE HEALTH INSURANCE | Source: Ambulatory Visit | Attending: Radiation Oncology | Admitting: Radiation Oncology

## 2012-01-09 ENCOUNTER — Ambulatory Visit
Admission: RE | Admit: 2012-01-09 | Discharge: 2012-01-09 | Disposition: A | Discharge status: Home / Self Care | Payer: PRIVATE HEALTH INSURANCE | Source: Ambulatory Visit | Attending: Radiation Oncology | Admitting: Radiation Oncology

## 2012-01-09 ENCOUNTER — Ambulatory Visit: Payer: PRIVATE HEALTH INSURANCE

## 2012-01-10 ENCOUNTER — Ambulatory Visit
Admission: RE | Admit: 2012-01-10 | Discharge: 2012-01-10 | Disposition: A | Discharge status: Home / Self Care | Payer: PRIVATE HEALTH INSURANCE | Source: Ambulatory Visit | Attending: Radiation Oncology | Admitting: Radiation Oncology

## 2012-01-10 ENCOUNTER — Ambulatory Visit: Payer: PRIVATE HEALTH INSURANCE

## 2012-01-10 ENCOUNTER — Encounter: Payer: Self-pay | Admitting: Radiation Oncology

## 2012-01-10 VITALS — BP 100/53 | HR 93 | Temp 98.3°F | Wt 109.6 lb

## 2012-01-10 DIAGNOSIS — C7931 Secondary malignant neoplasm of brain: Secondary | ICD-10-CM

## 2012-01-10 DIAGNOSIS — R918 Other nonspecific abnormal finding of lung field: Secondary | ICD-10-CM

## 2012-01-10 NOTE — Progress Notes (Signed)
Weekly under treat assessment of right lung.Denies pain, shortness of breath or cough.Overall tolerating treatment well.Has completed 13 of 33 treatments.

## 2012-01-10 NOTE — Progress Notes (Signed)
  Radiation Oncology         [phone] ________________________________  Name: Melanie Liu MRN: [id number]  Date: 01/10/2012  DOB: [birth year]  Weekly Radiation Therapy Management  Current Dose: 26 Gy     Planned Dose:  66 Gy  Narrative . . . . . . . . The patient presents for routine under treatment assessment.                                                      The patient is without complaint.                                 Set-up films were reviewed and tumor is getting smaller                                 The chart was checked. Physical Findings. . .  weight is 109 lb 9.6 oz (49.714 kg). Her temperature is 98.3 F (36.8 C). Her blood pressure is 100/53 and her pulse is 93. Her oxygen saturation is 98%. . Weight essentially stable.  No significant changes. Impression . . . . . . . The patient is  tolerating radiation. Plan . . . . . . . . . . . . Continue treatment as planned.  ________________________________  Artist Pais. Kathrynn Running, M.D.

## 2012-01-11 ENCOUNTER — Ambulatory Visit
Admission: RE | Admit: 2012-01-11 | Discharge: 2012-01-11 | Disposition: A | Discharge status: Home / Self Care | Payer: PRIVATE HEALTH INSURANCE | Source: Ambulatory Visit | Attending: Radiation Oncology | Admitting: Radiation Oncology

## 2012-01-11 ENCOUNTER — Ambulatory Visit: Payer: PRIVATE HEALTH INSURANCE

## 2012-01-14 ENCOUNTER — Ambulatory Visit: Payer: PRIVATE HEALTH INSURANCE | Admitting: Lab

## 2012-01-14 ENCOUNTER — Other Ambulatory Visit (HOSPITAL_BASED_OUTPATIENT_CLINIC_OR_DEPARTMENT_OTHER): Payer: PRIVATE HEALTH INSURANCE | Admitting: Lab

## 2012-01-14 ENCOUNTER — Ambulatory Visit: Payer: PRIVATE HEALTH INSURANCE

## 2012-01-14 ENCOUNTER — Ambulatory Visit (HOSPITAL_BASED_OUTPATIENT_CLINIC_OR_DEPARTMENT_OTHER): Payer: PRIVATE HEALTH INSURANCE

## 2012-01-14 ENCOUNTER — Ambulatory Visit
Admission: RE | Admit: 2012-01-14 | Discharge: 2012-01-14 | Disposition: A | Discharge status: Home / Self Care | Payer: PRIVATE HEALTH INSURANCE | Source: Ambulatory Visit | Attending: Radiation Oncology | Admitting: Radiation Oncology

## 2012-01-14 VITALS — BP 91/60 | HR 122 | Temp 97.5°F

## 2012-01-14 DIAGNOSIS — Z5111 Encounter for antineoplastic chemotherapy: Secondary | ICD-10-CM

## 2012-01-14 DIAGNOSIS — C3490 Malignant neoplasm of unspecified part of unspecified bronchus or lung: Secondary | ICD-10-CM

## 2012-01-14 DIAGNOSIS — C7931 Secondary malignant neoplasm of brain: Secondary | ICD-10-CM

## 2012-01-14 DIAGNOSIS — C3410 Malignant neoplasm of upper lobe, unspecified bronchus or lung: Secondary | ICD-10-CM

## 2012-01-14 LAB — CBC WITH DIFFERENTIAL/PLATELET
Basophils Absolute: 0.1 10*3/uL (ref 0.0–0.1)
Eosinophils Absolute: 0 10*3/uL (ref 0.0–0.5)
HGB: 10.4 g/dL — ABNORMAL LOW (ref 11.6–15.9)
LYMPH%: 10.6 % — ABNORMAL LOW (ref 14.0–49.7)
MCV: 89.5 fL (ref 79.5–101.0)
MONO#: 0.8 10*3/uL (ref 0.1–0.9)
MONO%: 13.5 % (ref 0.0–14.0)
NEUT#: 4.6 10*3/uL (ref 1.5–6.5)
Platelets: 430 10*3/uL — ABNORMAL HIGH (ref 145–400)
RDW: 16.2 % — ABNORMAL HIGH (ref 11.2–14.5)

## 2012-01-14 LAB — COMPREHENSIVE METABOLIC PANEL (CC13)
ALT: 11 U/L (ref 0–55)
AST: 11 U/L (ref 5–34)
Albumin: 2.7 g/dL — ABNORMAL LOW (ref 3.5–5.0)
Calcium: 9.7 mg/dL (ref 8.4–10.4)
Chloride: 106 mEq/L (ref 98–107)
Potassium: 4.5 mEq/L (ref 3.5–5.1)
Total Protein: 6.6 g/dL (ref 6.4–8.3)

## 2012-01-14 MED ORDER — SODIUM CHLORIDE 0.9 % IV SOLN
160.0000 mg | Freq: Once | INTRAVENOUS | Status: AC
  Administered 2012-01-14: 160 mg via INTRAVENOUS
  Filled 2012-01-14: qty 16

## 2012-01-14 MED ORDER — FAMOTIDINE IN NACL 20-0.9 MG/50ML-% IV SOLN
20.0000 mg | Freq: Once | INTRAVENOUS | Status: AC
  Administered 2012-01-14: 20 mg via INTRAVENOUS

## 2012-01-14 MED ORDER — ONDANSETRON 16 MG/50ML IVPB (CHCC)
16.0000 mg | Freq: Once | INTRAVENOUS | Status: AC
  Administered 2012-01-14: 16 mg via INTRAVENOUS

## 2012-01-14 MED ORDER — PACLITAXEL CHEMO INJECTION 300 MG/50ML
45.0000 mg/m2 | Freq: Once | INTRAVENOUS | Status: AC
  Administered 2012-01-14: 66 mg via INTRAVENOUS
  Filled 2012-01-14: qty 11

## 2012-01-14 MED ORDER — DEXAMETHASONE SODIUM PHOSPHATE 4 MG/ML IJ SOLN
20.0000 mg | Freq: Once | INTRAMUSCULAR | Status: AC
  Administered 2012-01-14: 20 mg via INTRAVENOUS

## 2012-01-14 MED ORDER — DIPHENHYDRAMINE HCL 50 MG/ML IJ SOLN
50.0000 mg | Freq: Once | INTRAMUSCULAR | Status: AC
  Administered 2012-01-14: 50 mg via INTRAVENOUS

## 2012-01-14 MED ORDER — SODIUM CHLORIDE 0.9 % IV SOLN
Freq: Once | INTRAVENOUS | Status: AC
  Administered 2012-01-14: 12:00:00 via INTRAVENOUS

## 2012-01-14 NOTE — Patient Instructions (Signed)
[facility] Discharge Instructions for Patients Receiving Chemotherapy  Today you received the following chemotherapy agents Taxol/Carboplatin   To help prevent nausea and vomiting after your treatment, we encourage you to take your nausea medication as perscribed  If you develop nausea and vomiting that is not controlled by your nausea medication, call the clinic. If it is after clinic hours your family physician or the after hours number for the clinic or go to the Emergency Department.   BELOW ARE SYMPTOMS THAT SHOULD BE REPORTED IMMEDIATELY:  *FEVER GREATER THAN 100.5 F  *CHILLS WITH OR WITHOUT FEVER  NAUSEA AND VOMITING THAT IS NOT CONTROLLED WITH YOUR NAUSEA MEDICATION  *UNUSUAL SHORTNESS OF BREATH  *UNUSUAL BRUISING OR BLEEDING  TENDERNESS IN MOUTH AND THROAT WITH OR WITHOUT PRESENCE OF ULCERS  *URINARY PROBLEMS  *BOWEL PROBLEMS  UNUSUAL RASH Items with * indicate a potential emergency and should be followed up as soon as possible.  One of the nurses will contact you 24 hours after your treatment. Please let the nurse know about any problems that you may have experienced. Feel free to call the clinic you have any questions or concerns. The clinic phone number is [phone].

## 2012-01-15 ENCOUNTER — Encounter: Payer: Self-pay | Admitting: Internal Medicine

## 2012-01-15 ENCOUNTER — Ambulatory Visit: Payer: PRIVATE HEALTH INSURANCE

## 2012-01-15 ENCOUNTER — Telehealth: Payer: Self-pay | Admitting: *Deleted

## 2012-01-15 ENCOUNTER — Ambulatory Visit
Admission: RE | Admit: 2012-01-15 | Discharge: 2012-01-15 | Disposition: A | Discharge status: Home / Self Care | Payer: PRIVATE HEALTH INSURANCE | Source: Ambulatory Visit | Attending: Radiation Oncology | Admitting: Radiation Oncology

## 2012-01-15 NOTE — Telephone Encounter (Signed)
Per staff message and POF I have adjusted appt on 11/25. [name]

## 2012-01-15 NOTE — Progress Notes (Signed)
Put [employer] disability form and two credit disability forms on nurse's desk.

## 2012-01-16 ENCOUNTER — Other Ambulatory Visit: Payer: Self-pay | Admitting: Radiation Therapy

## 2012-01-16 ENCOUNTER — Ambulatory Visit: Payer: PRIVATE HEALTH INSURANCE

## 2012-01-16 ENCOUNTER — Ambulatory Visit
Admission: RE | Admit: 2012-01-16 | Discharge: 2012-01-16 | Disposition: A | Discharge status: Home / Self Care | Payer: PRIVATE HEALTH INSURANCE | Source: Ambulatory Visit | Attending: Radiation Oncology | Admitting: Radiation Oncology

## 2012-01-16 DIAGNOSIS — C7931 Secondary malignant neoplasm of brain: Secondary | ICD-10-CM

## 2012-01-17 ENCOUNTER — Ambulatory Visit
Admission: RE | Admit: 2012-01-17 | Discharge: 2012-01-17 | Disposition: A | Discharge status: Home / Self Care | Payer: PRIVATE HEALTH INSURANCE | Source: Ambulatory Visit | Attending: Radiation Oncology | Admitting: Radiation Oncology

## 2012-01-17 ENCOUNTER — Encounter: Payer: Self-pay | Admitting: Internal Medicine

## 2012-01-17 ENCOUNTER — Ambulatory Visit: Payer: PRIVATE HEALTH INSURANCE

## 2012-01-17 NOTE — Progress Notes (Signed)
Faxed disability form to [employer] @ [phone]; put this form and credit disability forms in registration desk.

## 2012-01-18 ENCOUNTER — Ambulatory Visit
Admission: RE | Admit: 2012-01-18 | Discharge: 2012-01-18 | Disposition: A | Discharge status: Home / Self Care | Payer: PRIVATE HEALTH INSURANCE | Source: Ambulatory Visit | Attending: Radiation Oncology | Admitting: Radiation Oncology

## 2012-01-18 ENCOUNTER — Encounter: Payer: Self-pay | Admitting: Radiation Oncology

## 2012-01-18 ENCOUNTER — Ambulatory Visit: Payer: PRIVATE HEALTH INSURANCE

## 2012-01-18 VITALS — BP 106/73 | HR 89 | Resp 18 | Wt 109.4 lb

## 2012-01-18 DIAGNOSIS — R918 Other nonspecific abnormal finding of lung field: Secondary | ICD-10-CM

## 2012-01-18 DIAGNOSIS — C7949 Secondary malignant neoplasm of other parts of nervous system: Secondary | ICD-10-CM

## 2012-01-18 NOTE — Progress Notes (Signed)
Received patient in the clinic today for PUT with Dr. Kathrynn Running. Patient alert and oriented to person, place, and time. No distress noted. Steady gait noted. Pleasant affect noted. Patient denies pain at this time. Patient denies cough however, noted patient cough several times while this writer was present in the room. Dry cough noted. Patient denies shortness of breath. Six pound weight gain noted since last week. Patient reports occasional difficulty swallowing for which she takes carafate. Patient denies headache, dizziness, nausea, or vomiting. Reported all findings to Dr. Kathrynn Running.

## 2012-01-19 ENCOUNTER — Encounter: Payer: Self-pay | Admitting: Radiation Oncology

## 2012-01-19 NOTE — Progress Notes (Signed)
  Radiation Oncology         [phone] ________________________________  Name: Melanie Liu MRN: [id number]  Date: 01/18/2012  DOB: 05/11/1950  Weekly Radiation Therapy Management  Current Dose: 38 Gy     Planned Dose:  66 Gy  Narrative . . . . . . . . The patient presents for routine under treatment assessment.   Received patient in the clinic today for PUT with Dr. Kathrynn Running. Patient alert and oriented to person, place, and time. No distress noted. Steady gait noted. Pleasant affect noted. Patient denies pain at this time. Patient denies cough however, noted patient cough several times while this writer was present in the room. Dry cough noted. Patient denies shortness of breath. Six pound weight gain noted since last week. Patient reports occasional difficulty swallowing for which she takes carafate. Patient denies headache, dizziness, nausea, or vomiting                                 Set-up films were reviewed.                                 The chart was checked. Physical Findings. . .  weight is 109 lb 6.4 oz (49.624 kg). Her blood pressure is 106/73 and her pulse is 89. Her respiration is 18 and oxygen saturation is 99%. . Weight up.  No significant changes. Impression . . . . . . . The patient is  tolerating radiation. Plan . . . . . . . . . . . . Continue treatment as planned.  ________________________________  Artist Pais. Kathrynn Running, M.D.

## 2012-01-21 ENCOUNTER — Ambulatory Visit (HOSPITAL_BASED_OUTPATIENT_CLINIC_OR_DEPARTMENT_OTHER): Payer: PRIVATE HEALTH INSURANCE

## 2012-01-21 ENCOUNTER — Ambulatory Visit: Payer: PRIVATE HEALTH INSURANCE

## 2012-01-21 ENCOUNTER — Other Ambulatory Visit (HOSPITAL_BASED_OUTPATIENT_CLINIC_OR_DEPARTMENT_OTHER): Payer: PRIVATE HEALTH INSURANCE | Admitting: Lab

## 2012-01-21 ENCOUNTER — Ambulatory Visit
Admission: RE | Admit: 2012-01-21 | Discharge: 2012-01-21 | Disposition: A | Discharge status: Home / Self Care | Payer: PRIVATE HEALTH INSURANCE | Source: Ambulatory Visit | Attending: Radiation Oncology | Admitting: Radiation Oncology

## 2012-01-21 VITALS — BP 101/53 | HR 100 | Temp 97.3°F

## 2012-01-21 DIAGNOSIS — C7931 Secondary malignant neoplasm of brain: Secondary | ICD-10-CM

## 2012-01-21 DIAGNOSIS — C3490 Malignant neoplasm of unspecified part of unspecified bronchus or lung: Secondary | ICD-10-CM

## 2012-01-21 DIAGNOSIS — Z5111 Encounter for antineoplastic chemotherapy: Secondary | ICD-10-CM

## 2012-01-21 DIAGNOSIS — C3410 Malignant neoplasm of upper lobe, unspecified bronchus or lung: Secondary | ICD-10-CM

## 2012-01-21 LAB — CBC WITH DIFFERENTIAL/PLATELET
Basophils Absolute: 0 10*3/uL (ref 0.0–0.1)
EOS%: 0.4 % (ref 0.0–7.0)
Eosinophils Absolute: 0 10*3/uL (ref 0.0–0.5)
HCT: 32 % — ABNORMAL LOW (ref 34.8–46.6)
HGB: 10 g/dL — ABNORMAL LOW (ref 11.6–15.9)
LYMPH%: 13.5 % — ABNORMAL LOW (ref 14.0–49.7)
MCH: 28.2 pg (ref 25.1–34.0)
MCV: 90.1 fL (ref 79.5–101.0)
MONO%: 12.4 % (ref 0.0–14.0)
NEUT#: 3.7 10*3/uL (ref 1.5–6.5)
NEUT%: 72.9 % (ref 38.4–76.8)
Platelets: 310 10*3/uL (ref 145–400)
RDW: 16.8 % — ABNORMAL HIGH (ref 11.2–14.5)

## 2012-01-21 LAB — COMPREHENSIVE METABOLIC PANEL (CC13)
Alkaline Phosphatase: 96 U/L (ref 40–150)
BUN: 17 mg/dL (ref 7.0–26.0)
CO2: 26 mEq/L (ref 22–29)
Glucose: 112 mg/dl — ABNORMAL HIGH (ref 70–99)
Sodium: 138 mEq/L (ref 136–145)
Total Bilirubin: 0.28 mg/dL (ref 0.20–1.20)
Total Protein: 6.8 g/dL (ref 6.4–8.3)

## 2012-01-21 MED ORDER — PACLITAXEL CHEMO INJECTION 300 MG/50ML
45.0000 mg/m2 | Freq: Once | INTRAVENOUS | Status: AC
  Administered 2012-01-21: 66 mg via INTRAVENOUS
  Filled 2012-01-21: qty 11

## 2012-01-21 MED ORDER — DIPHENHYDRAMINE HCL 50 MG/ML IJ SOLN
50.0000 mg | Freq: Once | INTRAMUSCULAR | Status: AC
  Administered 2012-01-21: 50 mg via INTRAVENOUS

## 2012-01-21 MED ORDER — DEXAMETHASONE SODIUM PHOSPHATE 4 MG/ML IJ SOLN
20.0000 mg | Freq: Once | INTRAMUSCULAR | Status: AC
  Administered 2012-01-21: 20 mg via INTRAVENOUS

## 2012-01-21 MED ORDER — ONDANSETRON 16 MG/50ML IVPB (CHCC)
16.0000 mg | Freq: Once | INTRAVENOUS | Status: AC
  Administered 2012-01-21: 16 mg via INTRAVENOUS

## 2012-01-21 MED ORDER — FAMOTIDINE IN NACL 20-0.9 MG/50ML-% IV SOLN
20.0000 mg | Freq: Once | INTRAVENOUS | Status: AC
  Administered 2012-01-21: 20 mg via INTRAVENOUS

## 2012-01-21 MED ORDER — CARBOPLATIN CHEMO INJECTION 450 MG/45ML
183.4000 mg | Freq: Once | INTRAVENOUS | Status: AC
  Administered 2012-01-21: 180 mg via INTRAVENOUS
  Filled 2012-01-21: qty 18

## 2012-01-21 MED ORDER — SODIUM CHLORIDE 0.9 % IV SOLN
Freq: Once | INTRAVENOUS | Status: AC
  Administered 2012-01-21: 12:00:00 via INTRAVENOUS

## 2012-01-21 NOTE — Progress Notes (Signed)
Pt reports constipation last week.  She self administered enemas.  She did not call cancer center to check which medication to use.  Spent time with patient educating on thrombocytopenia, bowel regimen/constipation management.

## 2012-01-21 NOTE — Patient Instructions (Signed)
[facility] Discharge Instructions for Patients Receiving Chemotherapy  Today you received the following chemotherapy agents: taxol, carboplatin  To help prevent nausea and vomiting after your treatment, we encourage you to take your nausea medication.  Take it as often as prescribed.     If you develop nausea and vomiting that is not controlled by your nausea medication, call the clinic. If it is after clinic hours your family physician or the after hours number for the clinic or go to the Emergency Department.   BELOW ARE SYMPTOMS THAT SHOULD BE REPORTED IMMEDIATELY:  *FEVER GREATER THAN 100.5 F  *CHILLS WITH OR WITHOUT FEVER  NAUSEA AND VOMITING THAT IS NOT CONTROLLED WITH YOUR NAUSEA MEDICATION  *UNUSUAL SHORTNESS OF BREATH  *UNUSUAL BRUISING OR BLEEDING  TENDERNESS IN MOUTH AND THROAT WITH OR WITHOUT PRESENCE OF ULCERS  *URINARY PROBLEMS  *BOWEL PROBLEMS  UNUSUAL RASH Items with * indicate a potential emergency and should be followed up as soon as possible.  Feel free to call the clinic you have any questions or concerns. The clinic phone number is [phone].   I have been informed and understand all the instructions given to me. I know to contact the clinic, my physician, or go to the Emergency Department if any problems should occur. I do not have any questions at this time, but understand that I may call the clinic during office hours   should I have any questions or need assistance in obtaining follow up care.    __________________________________________  _____________  __________ Signature of Patient or Authorized Representative            Date                   Time    __________________________________________ Nurse's Signature     Constipation, Adult Constipation is when a person has fewer than 3 bowel movements a week; has difficulty having a bowel movement; or has stools that are dry, hard, or larger than normal. As people grow  older, constipation is more common. If you try to fix constipation with medicines that make you have a bowel movement (laxatives), the problem may get worse. Long-term laxative use may cause the muscles of the colon to become weak. A low-fiber diet, not taking in enough fluids, and taking certain medicines may make constipation worse. CAUSES   Certain medicines, such as antidepressants, pain medicine, iron supplements, antacids, and water pills.   Certain diseases, such as diabetes, irritable bowel syndrome (IBS), thyroid disease, or depression.   Not drinking enough water.   Not eating enough fiber-rich foods.   Stress or travel.  Lack of physical activity or exercise.  Not going to the restroom when there is the urge to have a bowel movement.  Ignoring the urge to have a bowel movement.  Using laxatives too much. SYMPTOMS   Having fewer than 3 bowel movements a week.   Straining to have a bowel movement.   Having hard, dry, or larger than normal stools.   Feeling full or bloated.   Pain in the lower abdomen.  Not feeling relief after having a bowel movement. DIAGNOSIS  Your caregiver will take a medical history and perform a physical exam. Further testing may be done for severe constipation. Some tests may include:   A barium enema X-ray to examine your rectum, colon, and sometimes, your small intestine.  A sigmoidoscopy to examine your lower colon.  A colonoscopy to examine your entire colon. TREATMENT  Treatment will depend on the severity of your constipation and what is causing it. Some dietary treatments include drinking more fluids and eating more fiber-rich foods. Lifestyle treatments may include regular exercise. If these diet and lifestyle recommendations do not help, your caregiver may recommend taking over-the-counter laxative medicines to help you have bowel movements. Prescription medicines may be prescribed if over-the-counter medicines do not work.    HOME CARE INSTRUCTIONS   Increase dietary fiber in your diet, such as fruits, vegetables, whole grains, and beans. Limit high-fat and processed sugars in your diet, such as [place] fries, hamburgers, cookies, candies, and soda.   A fiber supplement may be added to your diet if you cannot get enough fiber from foods.   Drink enough fluids to keep your urine clear or pale yellow.   Exercise regularly or as directed by your caregiver.   Go to the restroom when you have the urge to go. Do not hold it.  Only take medicines as directed by your caregiver. Do not take other medicines for constipation without talking to your caregiver first. SEEK IMMEDIATE MEDICAL CARE IF:   You have bright red blood in your stool.   Your constipation lasts for more than 4 days or gets worse.   You have abdominal or rectal pain.   You have thin, pencil-like stools.  You have unexplained weight loss. MAKE SURE YOU:   Understand these instructions.  Will watch your condition.  Will get help right away if you are not doing well or get worse. Document Released: 11/18/2003 Document Revised: 05/14/2011 Document Reviewed: 01/23/2011 [facility] Patient Information 2013 [place], [place].

## 2012-01-22 ENCOUNTER — Ambulatory Visit
Admission: RE | Admit: 2012-01-22 | Discharge: 2012-01-22 | Disposition: A | Discharge status: Home / Self Care | Payer: PRIVATE HEALTH INSURANCE | Source: Ambulatory Visit | Attending: Radiation Oncology | Admitting: Radiation Oncology

## 2012-01-22 ENCOUNTER — Ambulatory Visit: Payer: PRIVATE HEALTH INSURANCE

## 2012-01-22 NOTE — Progress Notes (Signed)
FAXED 23 PAGES OF CLINICAL TO [facility], RN CASE MANAGER FOR [employer] FAX [phone]- PHONE [phone] EXT [phone]. [place] IS OK

## 2012-01-23 ENCOUNTER — Ambulatory Visit
Admission: RE | Admit: 2012-01-23 | Discharge: 2012-01-23 | Disposition: A | Discharge status: Home / Self Care | Payer: PRIVATE HEALTH INSURANCE | Source: Ambulatory Visit | Attending: Radiation Oncology | Admitting: Radiation Oncology

## 2012-01-23 ENCOUNTER — Ambulatory Visit: Payer: PRIVATE HEALTH INSURANCE

## 2012-01-23 ENCOUNTER — Encounter: Payer: Self-pay | Admitting: Internal Medicine

## 2012-01-23 NOTE — Progress Notes (Signed)
Put [employer] form on nurse's desk.

## 2012-01-24 ENCOUNTER — Ambulatory Visit
Admission: RE | Admit: 2012-01-24 | Discharge: 2012-01-24 | Disposition: A | Discharge status: Home / Self Care | Payer: PRIVATE HEALTH INSURANCE | Source: Ambulatory Visit | Attending: Radiation Oncology | Admitting: Radiation Oncology

## 2012-01-24 ENCOUNTER — Ambulatory Visit: Payer: PRIVATE HEALTH INSURANCE

## 2012-01-24 ENCOUNTER — Encounter: Payer: Self-pay | Admitting: Internal Medicine

## 2012-01-24 NOTE — Progress Notes (Signed)
Put [employer] form in registration desk. °

## 2012-01-25 ENCOUNTER — Ambulatory Visit
Admission: RE | Admit: 2012-01-25 | Discharge: 2012-01-25 | Disposition: A | Discharge status: Home / Self Care | Payer: PRIVATE HEALTH INSURANCE | Source: Ambulatory Visit | Attending: Radiation Oncology | Admitting: Radiation Oncology

## 2012-01-25 ENCOUNTER — Ambulatory Visit: Payer: PRIVATE HEALTH INSURANCE

## 2012-01-25 ENCOUNTER — Encounter: Payer: Self-pay | Admitting: Radiation Oncology

## 2012-01-25 VITALS — BP 100/69 | HR 112 | Temp 97.9°F | Resp 20 | Wt 108.5 lb

## 2012-01-25 DIAGNOSIS — R918 Other nonspecific abnormal finding of lung field: Secondary | ICD-10-CM

## 2012-01-25 NOTE — Progress Notes (Signed)
  Radiation Oncology         [phone] ________________________________  Name: Melanie Liu MRN: [id number]  Date: 01/25/2012  DOB: [birth year]  Weekly Radiation Therapy Management  Current Dose: 46 Gy     Planned Dose:  66 Gy  Narrative . . . . . . . . The patient presents for routine under treatment assessment.                                             The patient is without complaint.                               Set-up films were reviewed.                                 The chart was checked. Physical Findings. . .  weight is 108 lb 8 oz (49.215 kg). Her oral temperature is 97.9 F (36.6 C). Her blood pressure is 100/69 and her pulse is 112. Her respiration is 20 and oxygen saturation is 99%. . Weight essentially stable.  No significant changes. Impression . . . . . . . The patient is  tolerating radiation. Plan . . . . . . . . . . . . Continue treatment as planned.  ________________________________  Artist Pais. Kathrynn Running, M.D.

## 2012-01-25 NOTE — Progress Notes (Signed)
patient here weekly rad txs, 23/33 rad txs completed so far right lung,  Front and back right side, skin intact, uses radiaplex gel bid, do0es have a dry cough, denys shortness breath, eats stewed potatoes, rice krispies, and drinks 4-5 cans boost daily, c/o constipation, colace not helping, she was suggested miralax   By med onc no c/o pain 4:56 PM

## 2012-01-25 NOTE — Progress Notes (Signed)
Still taking decadron 2mg  daily  Po, daily, looks like start of thrush on her tongue,  Hasn't been treated yet today 4:57 PM

## 2012-01-26 ENCOUNTER — Ambulatory Visit
Admission: RE | Admit: 2012-01-26 | Discharge: 2012-01-26 | Disposition: A | Discharge status: Home / Self Care | Payer: PRIVATE HEALTH INSURANCE | Source: Ambulatory Visit | Attending: Radiation Oncology | Admitting: Radiation Oncology

## 2012-01-28 ENCOUNTER — Telehealth: Payer: Self-pay | Admitting: *Deleted

## 2012-01-28 ENCOUNTER — Ambulatory Visit: Payer: PRIVATE HEALTH INSURANCE

## 2012-01-28 ENCOUNTER — Other Ambulatory Visit (HOSPITAL_BASED_OUTPATIENT_CLINIC_OR_DEPARTMENT_OTHER): Payer: PRIVATE HEALTH INSURANCE | Admitting: Lab

## 2012-01-28 ENCOUNTER — Encounter: Payer: Self-pay | Admitting: Physician Assistant

## 2012-01-28 ENCOUNTER — Ambulatory Visit (HOSPITAL_BASED_OUTPATIENT_CLINIC_OR_DEPARTMENT_OTHER): Payer: PRIVATE HEALTH INSURANCE

## 2012-01-28 ENCOUNTER — Ambulatory Visit (HOSPITAL_BASED_OUTPATIENT_CLINIC_OR_DEPARTMENT_OTHER): Payer: PRIVATE HEALTH INSURANCE | Admitting: Physician Assistant

## 2012-01-28 ENCOUNTER — Ambulatory Visit
Admission: RE | Admit: 2012-01-28 | Discharge: 2012-01-28 | Disposition: A | Discharge status: Home / Self Care | Payer: PRIVATE HEALTH INSURANCE | Source: Ambulatory Visit | Attending: Radiation Oncology | Admitting: Radiation Oncology

## 2012-01-28 VITALS — BP 106/71 | HR 113 | Temp 97.0°F | Resp 18 | Ht 62.0 in | Wt 103.2 lb

## 2012-01-28 DIAGNOSIS — Z5111 Encounter for antineoplastic chemotherapy: Secondary | ICD-10-CM

## 2012-01-28 DIAGNOSIS — C3410 Malignant neoplasm of upper lobe, unspecified bronchus or lung: Secondary | ICD-10-CM

## 2012-01-28 DIAGNOSIS — C3490 Malignant neoplasm of unspecified part of unspecified bronchus or lung: Secondary | ICD-10-CM

## 2012-01-28 DIAGNOSIS — C7931 Secondary malignant neoplasm of brain: Secondary | ICD-10-CM

## 2012-01-28 DIAGNOSIS — C7949 Secondary malignant neoplasm of other parts of nervous system: Secondary | ICD-10-CM

## 2012-01-28 LAB — COMPREHENSIVE METABOLIC PANEL (CC13)
ALT: 8 U/L (ref 0–55)
AST: 11 U/L (ref 5–34)
BUN: 14 mg/dL (ref 7.0–26.0)
CO2: 28 mEq/L (ref 22–29)
Calcium: 10.2 mg/dL (ref 8.4–10.4)
Chloride: 102 mEq/L (ref 98–107)
Creatinine: 0.8 mg/dL (ref 0.6–1.1)
Total Bilirubin: 0.34 mg/dL (ref 0.20–1.20)

## 2012-01-28 LAB — CBC WITH DIFFERENTIAL/PLATELET
BASO%: 0.5 % (ref 0.0–2.0)
Basophils Absolute: 0 10*3/uL (ref 0.0–0.1)
EOS%: 0.1 % (ref 0.0–7.0)
HCT: 31 % — ABNORMAL LOW (ref 34.8–46.6)
HGB: 10.4 g/dL — ABNORMAL LOW (ref 11.6–15.9)
LYMPH%: 3.4 % — ABNORMAL LOW (ref 14.0–49.7)
MCH: 30 pg (ref 25.1–34.0)
MCHC: 33.5 g/dL (ref 31.5–36.0)
NEUT%: 93.2 % — ABNORMAL HIGH (ref 38.4–76.8)
Platelets: 154 10*3/uL (ref 145–400)

## 2012-01-28 MED ORDER — DEXAMETHASONE SODIUM PHOSPHATE 4 MG/ML IJ SOLN
20.0000 mg | Freq: Once | INTRAMUSCULAR | Status: AC
  Administered 2012-01-28: 20 mg via INTRAVENOUS

## 2012-01-28 MED ORDER — PACLITAXEL CHEMO INJECTION 300 MG/50ML
45.0000 mg/m2 | Freq: Once | INTRAVENOUS | Status: AC
  Administered 2012-01-28: 66 mg via INTRAVENOUS
  Filled 2012-01-28: qty 11

## 2012-01-28 MED ORDER — SODIUM CHLORIDE 0.9 % IV SOLN
183.4000 mg | Freq: Once | INTRAVENOUS | Status: AC
  Administered 2012-01-28: 180 mg via INTRAVENOUS
  Filled 2012-01-28: qty 18

## 2012-01-28 MED ORDER — DIPHENHYDRAMINE HCL 50 MG/ML IJ SOLN
50.0000 mg | Freq: Once | INTRAMUSCULAR | Status: AC
  Administered 2012-01-28: 50 mg via INTRAVENOUS

## 2012-01-28 MED ORDER — FAMOTIDINE IN NACL 20-0.9 MG/50ML-% IV SOLN
20.0000 mg | Freq: Once | INTRAVENOUS | Status: AC
  Administered 2012-01-28: 20 mg via INTRAVENOUS

## 2012-01-28 MED ORDER — ONDANSETRON 16 MG/50ML IVPB (CHCC)
16.0000 mg | Freq: Once | INTRAVENOUS | Status: AC
  Administered 2012-01-28: 16 mg via INTRAVENOUS

## 2012-01-28 MED ORDER — SODIUM CHLORIDE 0.9 % IV SOLN
Freq: Once | INTRAVENOUS | Status: AC
  Administered 2012-01-28: 50 mL via INTRAVENOUS

## 2012-01-28 NOTE — Patient Instructions (Addendum)
[facility] Discharge Instructions for Patients Receiving Chemotherapy  Today you received the following chemotherapy agents Taxol/CArbo To help prevent nausea and vomiting after your treatment, we encourage you to take your nausea medication as per [name]  If you develop nausea and vomiting that is not controlled by your nausea medication, call the clinic. If it is after clinic hours your family physician or the after hours number for the clinic or go to the Emergency Department.   BELOW ARE SYMPTOMS THAT SHOULD BE REPORTED IMMEDIATELY:  *FEVER GREATER THAN 100.5 F  *CHILLS WITH OR WITHOUT FEVER  NAUSEA AND VOMITING THAT IS NOT CONTROLLED WITH YOUR NAUSEA MEDICATION  *UNUSUAL SHORTNESS OF BREATH  *UNUSUAL BRUISING OR BLEEDING  TENDERNESS IN MOUTH AND THROAT WITH OR WITHOUT PRESENCE OF ULCERS  *URINARY PROBLEMS  *BOWEL PROBLEMS  UNUSUAL RASH Items with * indicate a potential emergency and should be followed up as soon as possible.   Feel free to call the clinic you have any questions or concerns. The clinic phone number is [phone].   I have been informed and understand all the instructions given to me. I know to contact the clinic, my physician, or go to the Emergency Department if any problems should occur. I do not have any questions at this time, but understand that I may call the clinic during office hours   should I have any questions or need assistance in obtaining follow up care.    __________________________________________  _____________  __________ Signature of Patient or Authorized Representative            Date                   Time    __________________________________________ Nurse's Signature

## 2012-01-28 NOTE — Telephone Encounter (Signed)
Per Adrena the patient was to be scheduled fpr treatment on 12/2. Appt not made. Made appt per 11/4 POF.  [name]

## 2012-01-28 NOTE — Patient Instructions (Addendum)
Complete your course of concurrent chemoradiation as scheduled Followup with [name] in 6 weeks with a restaging CT scan of your chest to reevaluate your disease

## 2012-01-29 ENCOUNTER — Other Ambulatory Visit: Payer: Self-pay | Admitting: Certified Registered Nurse Anesthetist

## 2012-01-29 ENCOUNTER — Ambulatory Visit
Admission: RE | Admit: 2012-01-29 | Discharge: 2012-01-29 | Disposition: A | Discharge status: Home / Self Care | Payer: PRIVATE HEALTH INSURANCE | Source: Ambulatory Visit | Attending: Radiation Oncology | Admitting: Radiation Oncology

## 2012-01-29 ENCOUNTER — Ambulatory Visit: Payer: PRIVATE HEALTH INSURANCE

## 2012-01-30 ENCOUNTER — Ambulatory Visit
Admission: RE | Admit: 2012-01-30 | Discharge: 2012-01-30 | Disposition: A | Discharge status: Home / Self Care | Payer: PRIVATE HEALTH INSURANCE | Source: Ambulatory Visit | Attending: Radiation Oncology | Admitting: Radiation Oncology

## 2012-01-30 ENCOUNTER — Encounter: Payer: Self-pay | Admitting: Radiation Oncology

## 2012-01-30 ENCOUNTER — Ambulatory Visit: Payer: PRIVATE HEALTH INSURANCE

## 2012-01-30 ENCOUNTER — Encounter (INDEPENDENT_AMBULATORY_CARE_PROVIDER_SITE_OTHER): Payer: PRIVATE HEALTH INSURANCE | Admitting: Ophthalmology

## 2012-01-30 VITALS — BP 107/70 | HR 109 | Temp 98.0°F | Resp 20 | Wt 106.6 lb

## 2012-01-30 DIAGNOSIS — H2510 Age-related nuclear cataract, unspecified eye: Secondary | ICD-10-CM

## 2012-01-30 DIAGNOSIS — C3490 Malignant neoplasm of unspecified part of unspecified bronchus or lung: Secondary | ICD-10-CM

## 2012-01-30 DIAGNOSIS — H353290 Exudative age-related macular degeneration, unspecified eye, stage unspecified: Secondary | ICD-10-CM

## 2012-01-30 DIAGNOSIS — H43819 Vitreous degeneration, unspecified eye: Secondary | ICD-10-CM

## 2012-01-30 DIAGNOSIS — H3530 Unspecified macular degeneration: Secondary | ICD-10-CM

## 2012-01-30 DIAGNOSIS — H33009 Unspecified retinal detachment with retinal break, unspecified eye: Secondary | ICD-10-CM

## 2012-01-30 NOTE — Progress Notes (Signed)
   Department of Radiation Oncology  Phone:  [phone] Fax:        [phone]  Weekly Treatment Note    Name: Melanie Liu Date: 01/30/2012 MRN: [id number] DOB: [birth year]   Current dose: 54 Gy  Current fraction: 27   MEDICATIONS: Current Outpatient Prescriptions  Medication Sig Dispense Refill  . calcium-vitamin D (OSCAL WITH D) 500-200 MG-UNIT per tablet Take 1 tablet by mouth daily.      [name] dexamethasone (DECADRON) 4 MG tablet Take 2 mg by mouth daily.      [name] glucosamine-chondroitin 500-400 MG tablet Take 1 tablet by mouth daily.       . hyaluronate sodium (RADIAPLEXRX) GEL Apply topically 2 (two) times daily.      [name] oxyCODONE-acetaminophen (PERCOCET/ROXICET) 5-325 MG per tablet Take 1-2 tablets by mouth every 4 (four) hours as needed for pain.  90 tablet  0  . PRESCRIPTION MEDICATION Place into the left eye every 30 (thirty) days. Bevacizumab (Avastin) inj into the left eye      . sucralfate (CARAFATE) 1 G tablet Take 1 tablet (1 g total) by mouth 4 (four) times daily. 5 min before food  90 tablet  5  . vitamin E 400 UNIT capsule Take 400 Units by mouth daily.         ALLERGIES: Levaquin   LABORATORY DATA:  Lab Results  Component Value Date   WBC 4.8 01/28/2012   HGB 10.4* 01/28/2012   HCT 31.0* 01/28/2012   MCV 89.5 01/28/2012   PLT 154 01/28/2012   Lab Results  Component Value Date   NA 139 01/28/2012   K 4.7 01/28/2012   CL 102 01/28/2012   CO2 28 01/28/2012   Lab Results  Component Value Date   ALT 8 01/28/2012   AST 11 01/28/2012   ALKPHOS 93 01/28/2012   BILITOT 0.34 01/28/2012     NARRATIVE: Melanie Liu was seen today for weekly treatment management. The chart was checked and the patient's films were reviewed. The patient states that she is experiencing some esophagitis. This has been stable. She is using Carafate. The patient is applying skin cream to the chest which has been associated with some irritation.  PHYSICAL EXAMINATION:  weight is 106 lb 9.6 oz (48.353 kg). Her oral temperature is 98 F (36.7 C). Her blood pressure is 107/70 and her pulse is 109. Her respiration is 20 and oxygen saturation is 100%.      some dry desquamation is present within the posterior chest especially. No moist desquamation.  ASSESSMENT: The patient is doing satisfactorily with treatment.  PLAN: We will continue with the patient's radiation treatment as planned. The patient indicates that she believes that her esophagitis is being controlled adequately at this time with Carafate. She will therefore continue with this.

## 2012-01-30 NOTE — Progress Notes (Signed)
Pt states her "throat is slightly sore but she can deal with it". Pt has dry cough, fatigue, denies loss of appetite and SOB. Applying Radiaplex to right chest for hyperpigmentation. Advised she apply Neosporin if she begins to have moist peeling.

## 2012-02-01 ENCOUNTER — Telehealth: Payer: Self-pay | Admitting: Internal Medicine

## 2012-02-01 NOTE — Telephone Encounter (Signed)
Added lb for 12/9 - per AJ ok to do lab once more after last tx 12/2. S/w pt re 12/9 and also confirmed 12/2. Pt will get new schedule when she comes in 12/2 which will also also include January lb/ct/fu - pt aware.

## 2012-02-02 NOTE — Progress Notes (Signed)
No images are attached to the encounter. No scans are attached to the encounter. No scans are attached to the encounter. [facility] OFFICE PROGRESS NOTE  Melanie Hector, MD [address] [place] [address]  DIAGNOSIS: Metastatic non-small cell lung cancer, adenocarcinoma with a single brain lesion  PRIOR THERAPY: Status post stereotactic radiosurgery to the single brain lesion under the care Dr. Kathrynn Liu  CURRENT THERAPY: Concurrent chemoradiation with chemotherapy in the form of weekly carboplatin for an AUC of 2 and paclitaxel at 45 mg per meter squared concurrent with radiation therapy under the care Dr. Kathrynn Liu  INTERVAL HISTORY: Melanie Liu 60 y.o. female returns for a scheduled regular symptom management visit for followup of her metastatic locally advanced disease within the chest, non-small cell lung cancer, adenocarcinoma. She continues to tolerate her course of concurrent chemoradiation. She voiced no specific complaints. She denied chest pain, shortness of breath, fever or chills. She's had no night sweats or significant weight loss.   MEDICAL HISTORY: Past Medical History  Diagnosis Date  . Nodule of right lung     right upper lobe  . Anemia   . Rectovaginal fistula   . Lung cancer 11/23/11    biopsy-right  . Right frontal lobe lesion 12/13/11    CT head    ALLERGIES:  is allergic to levaquin.  MEDICATIONS:  Current Outpatient Prescriptions  Medication Sig Dispense Refill  . calcium-vitamin D (OSCAL WITH D) 500-200 MG-UNIT per tablet Take 1 tablet by mouth daily.      [name] dexamethasone (DECADRON) 4 MG tablet Take 2 mg by mouth daily.      [name] glucosamine-chondroitin 500-400 MG tablet Take 1 tablet by mouth daily.       . hyaluronate sodium (RADIAPLEXRX) GEL Apply topically 2 (two) times daily.      [name] oxyCODONE-acetaminophen (PERCOCET/ROXICET) 5-325 MG per tablet Take 1-2 tablets by mouth every 4 (four) hours as needed for pain.  90 tablet  0  .  PRESCRIPTION MEDICATION Place into the left eye every 30 (thirty) days. Bevacizumab (Avastin) inj into the left eye      . sucralfate (CARAFATE) 1 G tablet Take 1 tablet (1 g total) by mouth 4 (four) times daily. 5 min before food  90 tablet  5  . vitamin Liu 400 UNIT capsule Take 400 Units by mouth daily.        SURGICAL HISTORY:  Past Surgical History  Procedure Date  . Partial hysterectomy   . Hemmorhoid surgery   . Neck surgery   . Retinal detachment surgery     REVIEW OF SYSTEMS:  A comprehensive review of systems was negative except for: Gastrointestinal: positive for dysphagia   PHYSICAL EXAMINATION: General appearance: alert, cooperative, appears stated age and no distress Head: Normocephalic, without obvious abnormality, atraumatic Neck: no adenopathy, no carotid bruit, no JVD, supple, symmetrical, trachea midline and thyroid not enlarged, symmetric, no tenderness/mass/nodules Lymph nodes: Cervical, supraclavicular, and axillary nodes normal. Resp: clear to auscultation bilaterally Cardio: regular rate and rhythm, S1, S2 normal, no murmur, click, rub or gallop GI: soft, non-tender; bowel sounds normal; no masses,  no organomegaly Extremities: extremities normal, atraumatic, no cyanosis or edema Neurologic: Alert and oriented X 3, normal strength and tone. Normal symmetric reflexes. Normal coordination and gait  ECOG PERFORMANCE STATUS: 1 - Symptomatic but completely ambulatory  Blood pressure 106/71, pulse 113, temperature 97 F (36.1 C), temperature source Oral, resp. rate 18, height 5\' 2"  (1.575 m), weight 103 lb 3.2  oz (46.811 kg).  LABORATORY DATA: Lab Results  Component Value Date   WBC 4.8 01/28/2012   HGB 10.4* 01/28/2012   HCT 31.0* 01/28/2012   MCV 89.5 01/28/2012   PLT 154 01/28/2012      Chemistry      Component Value Date/Time   NA 139 01/28/2012 1114   K 4.7 01/28/2012 1114   CL 102 01/28/2012 1114   CO2 28 01/28/2012 1114   BUN 14.0 01/28/2012 1114    CREATININE 0.8 01/28/2012 1114      Component Value Date/Time   CALCIUM 10.2 01/28/2012 1114   ALKPHOS 93 01/28/2012 1114   AST 11 01/28/2012 1114   ALT 8 01/28/2012 1114   BILITOT 0.34 01/28/2012 1114       RADIOGRAPHIC STUDIES:  Ct Head W Wo Contrast  18-Dec-2011  *RADIOLOGY REPORT*  Clinical Data: Lung cancer.  Dizziness.  CT HEAD WITHOUT AND WITH CONTRAST  Technique:  Contiguous axial images were obtained from the base of the skull through the vertex without and with intravenous contrast.  Contrast: 75mL OMNIPAQUE IOHEXOL 300 MG/ML  SOLN  Comparison: None.  Findings: A 18 x 17 mm ring enhancing mass lesion is present in the right frontal lobe.  There is significant surrounding vasogenic edema with partial effacement frontal horn of the right lateral ventricle.  Mild subfalcine herniation is present.  There is effacement of the sulci within the anterior right frontal lobe.  The postcontrast images demonstrate no other foci of pathologic enhancement to suggest additional metastases.  No acute cortical infarct or hemorrhage is present.  The ventricles are otherwise normal size.  No significant extra-axial fluid collection is present.  IMPRESSION:  1.  17 x 18 mm peripherally enhancing mass lesion in the right frontal lobe is most compatible with a focal metastasis of the patient's known lung cancer. 2.  Extensive surrounding vasogenic edema with mass effect as described.  Critical Value/emergent results were called by telephone at the time of interpretation on 18-Dec-2011 at 04:35 p.m. to Dr. Tyrone Liu, who verbally acknowledged these results.   Original Report Authenticated By: Melanie Liu, M.D.    Mr Melanie Liu Wo Contrast  12/21/2011  *RADIOLOGY REPORT*  Clinical Data: Lung cancer with intracranial metastatic disease. Stereotactic radiation therapy treatment planning exam.  MRI HEAD WITHOUT AND WITH CONTRAST  Technique:  Multiplanar, multiecho pulse sequences of the brain and  surrounding structures were obtained according to standard protocol without and with intravenous contrast  Contrast: 10mL MULTIHANCE GADOBENATE DIMEGLUMINE 529 MG/ML IV SOLN  Comparison: 18-Dec-2011 head CT.  No comparison MR.  Findings: No acute infarct.  No intracranial hemorrhage.  Solitary anterior right frontal lobe ring enhancing 1.9 x 1.9 x 2 cm mass with prominent amount of surrounding vasogenic edema and local mass effect upon the right lateral ventricle which is displaced slightly posteriorly and minimal bowing of the anterior aspect of the septum to the left.  As patient has known primary lung malignancy this may represent a solitary metastatic lesion. Primary brain tumor not entirely excluded.  No restricted motion within the central aspect to suggest abscess.  Atrophy without hydrocephalus.  Major intracranial vascular structures are patent.  IMPRESSION: Solitary ring enhancing lesion right frontal lobe with surrounding vasogenic edema and mass effect as detailed above.   Original Report Authenticated By: Fuller Canada, M.D.      ASSESSMENT/PLAN: Patient is a very pleasant 61 year old white female with metastatic non-small cell lung cancer, adenocarcinoma with a single brain lesion now  status post SRS under the care of Dr. Kathrynn Liu. [name] had a discussion with Dr. Kathrynn Liu and it was decided to continue with concurrent chemoradiation for her locally advanced disease within her chest. She will complete her course of concurrent chemoradiation as scheduled. She'll followup with [name] in 6 weeks with a restaging CT scan of her chest to reevaluate her disease.     Melanie Liu, Melanie Phagan E, PA-C     All questions were answered. The patient knows to call the clinic with any problems, questions or concerns. We can certainly see the patient much sooner if necessary.  I spent 20 minutes counseling the patient face to face. The total time spent in the appointment was 30 minutes.

## 2012-02-04 ENCOUNTER — Ambulatory Visit
Admission: RE | Admit: 2012-02-04 | Discharge: 2012-02-04 | Disposition: A | Discharge status: Home / Self Care | Payer: PRIVATE HEALTH INSURANCE | Source: Ambulatory Visit | Attending: Radiation Oncology | Admitting: Radiation Oncology

## 2012-02-04 ENCOUNTER — Other Ambulatory Visit: Payer: Self-pay | Admitting: Internal Medicine

## 2012-02-04 ENCOUNTER — Ambulatory Visit (HOSPITAL_BASED_OUTPATIENT_CLINIC_OR_DEPARTMENT_OTHER): Payer: Medicaid Other

## 2012-02-04 ENCOUNTER — Ambulatory Visit: Payer: PRIVATE HEALTH INSURANCE

## 2012-02-04 ENCOUNTER — Other Ambulatory Visit (HOSPITAL_BASED_OUTPATIENT_CLINIC_OR_DEPARTMENT_OTHER): Payer: Medicaid Other | Admitting: Lab

## 2012-02-04 VITALS — BP 99/70 | HR 128 | Temp 97.8°F

## 2012-02-04 DIAGNOSIS — R918 Other nonspecific abnormal finding of lung field: Secondary | ICD-10-CM

## 2012-02-04 DIAGNOSIS — C3410 Malignant neoplasm of upper lobe, unspecified bronchus or lung: Secondary | ICD-10-CM

## 2012-02-04 DIAGNOSIS — C3490 Malignant neoplasm of unspecified part of unspecified bronchus or lung: Secondary | ICD-10-CM

## 2012-02-04 DIAGNOSIS — C7931 Secondary malignant neoplasm of brain: Secondary | ICD-10-CM

## 2012-02-04 DIAGNOSIS — Z5111 Encounter for antineoplastic chemotherapy: Secondary | ICD-10-CM

## 2012-02-04 LAB — COMPREHENSIVE METABOLIC PANEL (CC13)
ALT: 10 U/L (ref 0–55)
CO2: 24 mEq/L (ref 22–29)
Creatinine: 0.8 mg/dL (ref 0.6–1.1)
Glucose: 96 mg/dl (ref 70–99)
Total Bilirubin: 0.52 mg/dL (ref 0.20–1.20)

## 2012-02-04 LAB — CBC WITH DIFFERENTIAL/PLATELET
Eosinophils Absolute: 0 10*3/uL (ref 0.0–0.5)
LYMPH%: 21 % (ref 14.0–49.7)
MCH: 28.9 pg (ref 25.1–34.0)
MCV: 89.8 fL (ref 79.5–101.0)
MONO%: 10.3 % (ref 0.0–14.0)
NEUT#: 1.5 10*3/uL (ref 1.5–6.5)
Platelets: 108 10*3/uL — ABNORMAL LOW (ref 145–400)
RBC: 3.53 10*6/uL — ABNORMAL LOW (ref 3.70–5.45)
nRBC: 0 % (ref 0–0)

## 2012-02-04 MED ORDER — SODIUM CHLORIDE 0.9 % IV SOLN
Freq: Once | INTRAVENOUS | Status: AC
  Administered 2012-02-04: 12:00:00 via INTRAVENOUS

## 2012-02-04 MED ORDER — BIAFINE EX EMUL
Freq: Two times a day (BID) | CUTANEOUS | Status: DC
  Administered 2012-02-04: 11:00:00 via TOPICAL

## 2012-02-04 MED ORDER — SODIUM CHLORIDE 0.9 % IV SOLN
160.0000 mg | Freq: Once | INTRAVENOUS | Status: AC
  Administered 2012-02-04: 160 mg via INTRAVENOUS
  Filled 2012-02-04: qty 16

## 2012-02-04 MED ORDER — DIPHENHYDRAMINE HCL 50 MG/ML IJ SOLN
50.0000 mg | Freq: Once | INTRAMUSCULAR | Status: AC
  Administered 2012-02-04: 50 mg via INTRAVENOUS

## 2012-02-04 MED ORDER — FAMOTIDINE IN NACL 20-0.9 MG/50ML-% IV SOLN
20.0000 mg | Freq: Once | INTRAVENOUS | Status: AC
  Administered 2012-02-04: 20 mg via INTRAVENOUS

## 2012-02-04 MED ORDER — ONDANSETRON 16 MG/50ML IVPB (CHCC)
16.0000 mg | Freq: Once | INTRAVENOUS | Status: AC
  Administered 2012-02-04: 16 mg via INTRAVENOUS

## 2012-02-04 MED ORDER — DEXAMETHASONE SODIUM PHOSPHATE 4 MG/ML IJ SOLN
20.0000 mg | Freq: Once | INTRAMUSCULAR | Status: AC
  Administered 2012-02-04: 20 mg via INTRAVENOUS

## 2012-02-04 MED ORDER — PACLITAXEL CHEMO INJECTION 300 MG/50ML
45.0000 mg/m2 | Freq: Once | INTRAVENOUS | Status: AC
  Administered 2012-02-04: 66 mg via INTRAVENOUS
  Filled 2012-02-04: qty 11

## 2012-02-04 NOTE — Patient Instructions (Signed)
[facility] Discharge Instructions for Patients Receiving Chemotherapy  Today you received the following chemotherapy agents Taxol and Carboplatin  To help prevent nausea and vomiting after your treatment, we encourage you to take your nausea medication as prescribed.   If you develop nausea and vomiting that is not controlled by your nausea medication, call the clinic. If it is after clinic hours your family physician or the after hours number for the clinic or go to the Emergency Department.   BELOW ARE SYMPTOMS THAT SHOULD BE REPORTED IMMEDIATELY:  *FEVER GREATER THAN 100.5 F  *CHILLS WITH OR WITHOUT FEVER  NAUSEA AND VOMITING THAT IS NOT CONTROLLED WITH YOUR NAUSEA MEDICATION  *UNUSUAL SHORTNESS OF BREATH  *UNUSUAL BRUISING OR BLEEDING  TENDERNESS IN MOUTH AND THROAT WITH OR WITHOUT PRESENCE OF ULCERS  *URINARY PROBLEMS  *BOWEL PROBLEMS  UNUSUAL RASH Items with * indicate a potential emergency and should be followed up as soon as possible.  Feel free to call the clinic you have any questions or concerns. The clinic phone number is [phone].   I have been informed and understand all the instructions given to me. I know to contact the clinic, my physician, or go to the Emergency Department if any problems should occur. I do not have any questions at this time, but understand that I may call the clinic during office hours   should I have any questions or need assistance in obtaining follow up care.    

## 2012-02-04 NOTE — Progress Notes (Signed)
Pt in nursing after radiation tx today for skin check. Pt applying Neosporin to upper back for large area dry desquamation and small area dry desquamation on right upper chest, also applying Radiaplex. She states the area is burning. Gave pt Biafine lotion to use instead of Radiaplex; advised she continue w/Neosporin and come to nursing tomorrow if area worsens. Pt verbalized understanding, agreement.

## 2012-02-04 NOTE — Progress Notes (Signed)
1140-Ok per [name] to treat today despite counts-dhp, rn

## 2012-02-05 ENCOUNTER — Ambulatory Visit
Admission: RE | Admit: 2012-02-05 | Discharge: 2012-02-05 | Disposition: A | Discharge status: Home / Self Care | Payer: PRIVATE HEALTH INSURANCE | Source: Ambulatory Visit | Attending: Radiation Oncology | Admitting: Radiation Oncology

## 2012-02-05 ENCOUNTER — Ambulatory Visit: Payer: PRIVATE HEALTH INSURANCE

## 2012-02-06 ENCOUNTER — Ambulatory Visit
Admission: RE | Admit: 2012-02-06 | Discharge: 2012-02-06 | Disposition: A | Discharge status: Home / Self Care | Payer: PRIVATE HEALTH INSURANCE | Source: Ambulatory Visit | Attending: Radiation Oncology | Admitting: Radiation Oncology

## 2012-02-06 ENCOUNTER — Ambulatory Visit: Payer: PRIVATE HEALTH INSURANCE

## 2012-02-07 ENCOUNTER — Ambulatory Visit
Admission: RE | Admit: 2012-02-07 | Discharge: 2012-02-07 | Disposition: A | Discharge status: Home / Self Care | Payer: PRIVATE HEALTH INSURANCE | Source: Ambulatory Visit | Attending: Radiation Oncology | Admitting: Radiation Oncology

## 2012-02-07 ENCOUNTER — Ambulatory Visit: Payer: PRIVATE HEALTH INSURANCE

## 2012-02-08 ENCOUNTER — Ambulatory Visit: Payer: PRIVATE HEALTH INSURANCE

## 2012-02-08 ENCOUNTER — Encounter: Payer: Self-pay | Admitting: Radiation Oncology

## 2012-02-08 ENCOUNTER — Ambulatory Visit
Admission: RE | Admit: 2012-02-08 | Discharge: 2012-02-08 | Disposition: A | Discharge status: Home / Self Care | Payer: PRIVATE HEALTH INSURANCE | Source: Ambulatory Visit | Attending: Radiation Oncology | Admitting: Radiation Oncology

## 2012-02-08 VITALS — BP 96/68 | HR 124 | Temp 98.0°F | Resp 20 | Wt 103.0 lb

## 2012-02-08 DIAGNOSIS — C7931 Secondary malignant neoplasm of brain: Secondary | ICD-10-CM

## 2012-02-08 DIAGNOSIS — R918 Other nonspecific abnormal finding of lung field: Secondary | ICD-10-CM

## 2012-02-08 MED ORDER — BIAFINE EX EMUL
Freq: Two times a day (BID) | CUTANEOUS | Status: DC
  Administered 2012-02-08: 12:00:00 via TOPICAL

## 2012-02-08 NOTE — Progress Notes (Signed)
Pt denies cough, SOB, loss of appetite. Pt states carafate helpful w/swallowing. She is fatigued and continues to have skin discomfort/irritation but states it is less. Has been applying Biafine to chest and upper back for dry desquamation w/much improvement from last week. Gave her more Biafine. Pt completed today; gave her FU card.

## 2012-02-08 NOTE — Progress Notes (Signed)
  Radiation Oncology         [phone] ________________________________  Name: Melanie Liu MRN: [id number]  Date: 02/08/2012  DOB: 1950-03-14  Weekly Radiation Therapy Management  Current Dose: 66 Gy     Planned Dose:  66 Gy  Narrative . . . . . . . . The patient presents for the final under treatment assessment.  Pt denies cough, SOB, loss of appetite. Pt states carafate helpful w/swallowing. She is fatigued and continues to have skin discomfort/irritation but states it is less. Has been applying Biafine to chest and upper back for dry desquamation w/much improvement from last week. Gave her more Biafine. Pt completed today; gave her FU card                                   Set-up films were reviewed.                                 The chart was checked. Physical Findings. . . Weight essentially stable.  No significant changes. Impression . . . . . . Ceasar Mons Vitals:   02/08/12 1141  BP: 96/68  Pulse: 124  Temp: 98 F (36.7 C)  Resp: 20   The patient tolerated radiation relatively well. Plan . . . . . . . . . . . . Complete radiation today as scheduled, and follow-up in one month. The patient was encouraged to call or return to the clinic in the interim for any worsening symptoms.  ________________________________  Artist Pais Kathrynn Running, M.D.

## 2012-02-08 NOTE — Addendum Note (Signed)
Encounter addended by: Glennie Hawk, RN on: 02/08/2012  1:21 PM<BR>     Documentation filed: Inpatient MAR, Orders

## 2012-02-10 NOTE — Progress Notes (Signed)
  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       Radiation treatment dates:   12/24/2011-02/08/2012  Site/dose:    1.  She received stereotactic radiosurgery to her right anterior frontal 2 cm target was treated using 4 Dynamic Conformal Arcs to a prescription dose of 20 Gy on 12/27/11. ExacTrac Snap verification was performed for each couch angle. The 80.6% isodose line was prescribed 2.  She received 66 Gy in 33 fractions to the primary tumor in the right upper lobe of the lung.  Beams/energy:    1.  Her stereotactic radiosurgery was delivered with 6 megavolt photons in the flattening filter free setting 2.  Her thoracic radiotherapy was delivered using a standard 5 field beam arrangement employing 6 megavolt photons from the anterior and left anterior oblique gantry positions with 10 megavolt photons from the posterior. In addition, to help centralize the radiation dose, and a dynamic conformal arcs delivered 6 megavolt photons to target using a rotational gantry positions. Daily cone beam CT is were required prior to each fraction to align the target volume to minimize the radiation exposure to uninvolved portions of the chest.  Narrative: The patient tolerated radiation treatment relatively well.   She did not experience any acute toxicities related to her brain radiotherapy. She did suffer with some fatigue as well as esophagitis. She used Carafate for her esophagitis with success. She had some dry desquamation involving the upper back and used biopsy and for this.  Plan: The patient  has completed her course of radiation treatment. The patient will return to radiation oncology clinic for routine followup in one month. I advised her to call or return sooner if she has any questions or concerns related to her recovery or treatment. ________________________________  Artist Pais. Kathrynn Running, M.D.

## 2012-02-11 ENCOUNTER — Other Ambulatory Visit (HOSPITAL_BASED_OUTPATIENT_CLINIC_OR_DEPARTMENT_OTHER): Payer: Medicaid Other

## 2012-02-11 ENCOUNTER — Ambulatory Visit: Payer: PRIVATE HEALTH INSURANCE

## 2012-02-11 DIAGNOSIS — C3490 Malignant neoplasm of unspecified part of unspecified bronchus or lung: Secondary | ICD-10-CM

## 2012-02-11 DIAGNOSIS — C3410 Malignant neoplasm of upper lobe, unspecified bronchus or lung: Secondary | ICD-10-CM

## 2012-02-11 LAB — CBC WITH DIFFERENTIAL/PLATELET
BASO%: 0 % (ref 0.0–2.0)
EOS%: 1.1 % (ref 0.0–7.0)
HCT: 28.6 % — ABNORMAL LOW (ref 34.8–46.6)
LYMPH%: 24.5 % (ref 14.0–49.7)
MCH: 29.2 pg (ref 25.1–34.0)
MCHC: 31.8 g/dL (ref 31.5–36.0)
MCV: 91.7 fL (ref 79.5–101.0)
MONO#: 0.3 10*3/uL (ref 0.1–0.9)
MONO%: 14.1 % — ABNORMAL HIGH (ref 0.0–14.0)
NEUT%: 60.3 % (ref 38.4–76.8)
Platelets: 247 10*3/uL (ref 145–400)

## 2012-02-11 LAB — COMPREHENSIVE METABOLIC PANEL (CC13)
AST: 14 U/L (ref 5–34)
BUN: 19 mg/dL (ref 7.0–26.0)
Calcium: 9.6 mg/dL (ref 8.4–10.4)
Chloride: 104 mEq/L (ref 98–107)
Creatinine: 0.8 mg/dL (ref 0.6–1.1)

## 2012-02-29 ENCOUNTER — Encounter: Payer: Self-pay | Admitting: Internal Medicine

## 2012-03-04 ENCOUNTER — Encounter (INDEPENDENT_AMBULATORY_CARE_PROVIDER_SITE_OTHER): Payer: Medicaid Other | Admitting: Ophthalmology

## 2012-03-04 DIAGNOSIS — H33009 Unspecified retinal detachment with retinal break, unspecified eye: Secondary | ICD-10-CM

## 2012-03-04 DIAGNOSIS — H43819 Vitreous degeneration, unspecified eye: Secondary | ICD-10-CM

## 2012-03-04 DIAGNOSIS — H2510 Age-related nuclear cataract, unspecified eye: Secondary | ICD-10-CM

## 2012-03-04 DIAGNOSIS — H353290 Exudative age-related macular degeneration, unspecified eye, stage unspecified: Secondary | ICD-10-CM

## 2012-03-04 DIAGNOSIS — H3530 Unspecified macular degeneration: Secondary | ICD-10-CM

## 2012-03-07 ENCOUNTER — Other Ambulatory Visit (HOSPITAL_BASED_OUTPATIENT_CLINIC_OR_DEPARTMENT_OTHER): Payer: Medicaid Other

## 2012-03-07 DIAGNOSIS — C3410 Malignant neoplasm of upper lobe, unspecified bronchus or lung: Secondary | ICD-10-CM

## 2012-03-07 DIAGNOSIS — C3490 Malignant neoplasm of unspecified part of unspecified bronchus or lung: Secondary | ICD-10-CM

## 2012-03-07 LAB — CBC WITH DIFFERENTIAL/PLATELET
EOS%: 2.2 % (ref 0.0–7.0)
Eosinophils Absolute: 0.1 10*3/uL (ref 0.0–0.5)
LYMPH%: 11.8 % — ABNORMAL LOW (ref 14.0–49.7)
MCH: 32.3 pg (ref 25.1–34.0)
MCHC: 33.9 g/dL (ref 31.5–36.0)
MCV: 95.2 fL (ref 79.5–101.0)
MONO%: 9.1 % (ref 0.0–14.0)
NEUT#: 5.1 10*3/uL (ref 1.5–6.5)
Platelets: 375 10*3/uL (ref 145–400)
RBC: 3.61 10*6/uL — ABNORMAL LOW (ref 3.70–5.45)
RDW: 21.8 % — ABNORMAL HIGH (ref 11.2–14.5)

## 2012-03-07 LAB — COMPREHENSIVE METABOLIC PANEL (CC13)
AST: 12 U/L (ref 5–34)
Alkaline Phosphatase: 85 U/L (ref 40–150)
Glucose: 100 mg/dl — ABNORMAL HIGH (ref 70–99)
Potassium: 3.8 mEq/L (ref 3.5–5.1)
Sodium: 144 mEq/L (ref 136–145)
Total Bilirubin: 0.23 mg/dL (ref 0.20–1.20)
Total Protein: 8.1 g/dL (ref 6.4–8.3)

## 2012-03-10 ENCOUNTER — Encounter: Payer: Self-pay | Admitting: Internal Medicine

## 2012-03-10 ENCOUNTER — Telehealth: Payer: Self-pay | Admitting: Internal Medicine

## 2012-03-10 ENCOUNTER — Ambulatory Visit (HOSPITAL_COMMUNITY)
Admission: RE | Admit: 2012-03-10 | Discharge: 2012-03-10 | Disposition: A | Discharge status: Home / Self Care | Payer: PRIVATE HEALTH INSURANCE | Source: Ambulatory Visit | Attending: Physician Assistant | Admitting: Physician Assistant

## 2012-03-10 ENCOUNTER — Ambulatory Visit (HOSPITAL_BASED_OUTPATIENT_CLINIC_OR_DEPARTMENT_OTHER): Payer: Medicaid Other

## 2012-03-10 ENCOUNTER — Ambulatory Visit (HOSPITAL_BASED_OUTPATIENT_CLINIC_OR_DEPARTMENT_OTHER): Payer: Medicaid Other | Admitting: Internal Medicine

## 2012-03-10 VITALS — BP 119/82 | HR 108 | Temp 96.7°F | Resp 20 | Ht 62.0 in | Wt 101.2 lb

## 2012-03-10 DIAGNOSIS — K8020 Calculus of gallbladder without cholecystitis without obstruction: Secondary | ICD-10-CM | POA: Insufficient documentation

## 2012-03-10 DIAGNOSIS — C3410 Malignant neoplasm of upper lobe, unspecified bronchus or lung: Secondary | ICD-10-CM

## 2012-03-10 DIAGNOSIS — C3490 Malignant neoplasm of unspecified part of unspecified bronchus or lung: Secondary | ICD-10-CM

## 2012-03-10 DIAGNOSIS — Z79899 Other long term (current) drug therapy: Secondary | ICD-10-CM | POA: Insufficient documentation

## 2012-03-10 DIAGNOSIS — Z923 Personal history of irradiation: Secondary | ICD-10-CM | POA: Insufficient documentation

## 2012-03-10 DIAGNOSIS — C7931 Secondary malignant neoplasm of brain: Secondary | ICD-10-CM

## 2012-03-10 DIAGNOSIS — J438 Other emphysema: Secondary | ICD-10-CM | POA: Insufficient documentation

## 2012-03-10 MED ORDER — IOHEXOL 300 MG/ML  SOLN
80.0000 mL | Freq: Once | INTRAMUSCULAR | Status: AC | PRN
  Administered 2012-03-10: 80 mL via INTRAVENOUS

## 2012-03-10 MED ORDER — CYANOCOBALAMIN 1000 MCG/ML IJ SOLN
1000.0000 ug | Freq: Once | INTRAMUSCULAR | Status: AC
  Administered 2012-03-10: 1000 ug via INTRAMUSCULAR

## 2012-03-10 MED ORDER — DEXAMETHASONE 4 MG PO TABS: ORAL_TABLET | ORAL | Status: DC

## 2012-03-10 MED ORDER — FOLIC ACID 1 MG PO TABS: 1.0000 mg | ORAL_TABLET | Freq: Every day | ORAL | Status: DC

## 2012-03-10 NOTE — Progress Notes (Signed)
[facility]: [name] Telephone:(336) [phone]   Fax:(336) [phone]  OFFICE PROGRESS NOTE  Melanie Hector, MD [address] [place] [address]  DIAGNOSIS: Metastatic non-small cell lung cancer, adenocarcinoma with negative EGFR mutation and negative ALK gene translocation diagnosed in September of 2013 with metastatic single brain lesion.  PRIOR THERAPY:  1) Status post stereotactic radiosurgery to the single brain lesion under the care Dr. Kathrynn Liu. 2) Concurrent chemoradiation with chemotherapy in the form of weekly carboplatin for an AUC of 2 and paclitaxel at 45 mg per meter squared concurrent with radiation therapy under the care Dr. Kathrynn Liu with partial response in her disease  CURRENT THERAPY: The patient will start next week the first cycle of systemic chemotherapy with carboplatin for AUC of 5 and Alimta 500 mg/M2 every 3 weeks.  INTERVAL HISTORY: Melanie Liu 62 y.o. female returns to the clinic today for followup visit. The patient tolerated the previous course of concurrent chemoradiation fairly well with no significant adverse effects. The patient denied having any significant weight loss or night sweats. She denied having any significant chest pain, shortness breath, cough or hemoptysis. She had repeat CT scan of the chest performed recently and she is here for evaluation and discussion of her scan results.  MEDICAL HISTORY: Past Medical History  Diagnosis Date  . Nodule of right lung     right upper lobe  . Anemia   . Rectovaginal fistula   . Lung cancer 11/23/11    biopsy-right  . Right frontal lobe lesion 12/13/11    CT head    ALLERGIES:  is allergic to levaquin.  MEDICATIONS:  Current Outpatient Prescriptions  Medication Sig Dispense Refill  . calcium-vitamin D (OSCAL WITH D) 500-200 MG-UNIT per tablet Take 1 tablet by mouth daily.      [name] dexamethasone (DECADRON) 4 MG tablet Take 2 mg by mouth daily.      [name] emollient (BIAFINE) cream Apply  topically 2 (two) times daily.      [name] glucosamine-chondroitin 500-400 MG tablet Take 1 tablet by mouth daily.       . hyaluronate sodium (RADIAPLEXRX) GEL Apply topically 2 (two) times daily.      [name] oxyCODONE-acetaminophen (PERCOCET/ROXICET) 5-325 MG per tablet Take 1-2 tablets by mouth every 4 (four) hours as needed for pain.  90 tablet  0  . PRESCRIPTION MEDICATION Place into the left eye every 30 (thirty) days. Bevacizumab (Avastin) inj into the left eye      . sucralfate (CARAFATE) 1 G tablet Take 1 tablet (1 g total) by mouth 4 (four) times daily. 5 min before food  90 tablet  5  . vitamin E 400 UNIT capsule Take 400 Units by mouth daily.        SURGICAL HISTORY:  Past Surgical History  Procedure Date  . Partial hysterectomy   . Hemmorhoid surgery   . Neck surgery   . Retinal detachment surgery     REVIEW OF SYSTEMS:  A comprehensive review of systems was negative.   PHYSICAL EXAMINATION: General appearance: alert, cooperative and no distress Head: Normocephalic, without obvious abnormality, atraumatic Neck: no adenopathy Lymph nodes: Cervical, supraclavicular, and axillary nodes normal. Resp: clear to auscultation bilaterally Cardio: regular rate and rhythm, S1, S2 normal, no murmur, click, rub or gallop GI: soft, non-tender; bowel sounds normal; no masses,  no organomegaly Extremities: extremities normal, atraumatic, no cyanosis or edema Neurologic: Alert and oriented X 3, normal strength and tone. Normal symmetric reflexes. Normal  coordination and gait  ECOG PERFORMANCE STATUS: 1 - Symptomatic but completely ambulatory  Blood pressure 119/82, pulse 108, temperature 96.7 F (35.9 C), temperature source Oral, resp. rate 20, height 5\' 2"  (1.575 m), weight 101 lb 3.2 oz (45.904 kg).  LABORATORY DATA: Lab Results  Component Value Date   WBC 6.7 03/07/2012   HGB 11.7 03/07/2012   HCT 34.4* 03/07/2012   MCV 95.2 03/07/2012   PLT 375 03/07/2012      Chemistry      Component Value  Date/Time   NA 144 03/07/2012 0923   K 3.8 03/07/2012 0923   CL 106 03/07/2012 0923   CO2 27 03/07/2012 0923   BUN 14.0 03/07/2012 0923   CREATININE 0.9 03/07/2012 0923      Component Value Date/Time   CALCIUM 10.4 03/07/2012 0923   ALKPHOS 85 03/07/2012 0923   AST 12 03/07/2012 0923   ALT <6 Repeated and Verified 03/07/2012 0923   BILITOT 0.23 03/07/2012 0923       RADIOGRAPHIC STUDIES: Ct Chest W Contrast  03/10/2012  *RADIOLOGY REPORT*  Clinical Data: Lung cancer.  Chemotherapy in progress.  Radiation therapy complete.  CT CHEST WITH CONTRAST  Technique:  Multidetector CT imaging of the chest was performed following the standard protocol during bolus administration of intravenous contrast.  Contrast: 80mL OMNIPAQUE IOHEXOL 300 MG/ML  SOLN  Comparison: Chest CT 11/07/2011.  Findings:  Mediastinum: Heart size is normal. A trace amount of anterior pericardial fluid and/or thickening, unlikely to be of hemodynamic significance at this time.  There is prominent right suprahilar soft tissue which is largely contiguous with the right upper lobe mass.  There is at least one enlarged right hilar lymph node measuring up to 11 mm in short axis (image 24 of series 2).  No other pathologic mediastinal or left hilar lymphadenopathy is noted.  Esophagus is unremarkable in appearance.  Lungs/Pleura: Previously noted mass in the posterior aspect of the right upper lobe has decreased in size, currently measuring 7.3 x 6.0 cm.  This mass makes a broad contact with the pleura along nearly 50% of its circumference, and has slightly spiculated margins with some retraction of the overlying pleura posterolaterally.  This mass also abuts the major fissure, without definite extension into the superior segment of the right lower lobe.  This mass also makes a large amount of contact with the mediastinum where exerts mass effect upon adjacent structures and blurs the fat plane between the lesion and the adjacent esophagus such that some degree  of mediastinal invasion is difficult to entirely exclude.  Distal to the mass within the adjacent portions of the right upper lobe there is some mild postobstructive changes, likely pneumonitis.  There is a background of moderate - severe centrilobular emphysema and mild diffuse bronchial wall thickening. No pleural effusions.  Upper Abdomen: There appears to be a rim calcified gallstone in the gallbladder, however, this is incompletely visualized.  Musculoskeletal: There are no aggressive appearing lytic or blastic lesions noted in the visualized portions of the skeleton. Orthopedic fixation hardware in the lower cervical spine is incidentally noted.  IMPRESSION: 1.  Today's study demonstrates a positive response to therapy with decreased in size of large right upper lobe mass.  There continues to be some right hilar lymphadenopathy, however, previously noted low right paratracheal lymphadenopathy appears decreased in size as well.  No new lesions are otherwise noted. 2.  Cholelithiasis. 3.  Interval development of trace amount of anterior pericardial fluid and/or thickening, unlikely  to be of hemodynamic significance at this time. 4.  Mild diffuse bronchial wall thickening with moderate - severe centrilobular emphysema; imaging findings compatible with underlying COPD.   Original Report Authenticated By: Trudie Reed, M.D.     ASSESSMENT:  This is a very pleasant 62 years old white female with metastatic non-small cell lung cancer, adenocarcinoma with negative EGFR mutation and negative ALK gene translocation is status post a stereotactic radiotherapy to the brain lesion followed by concurrent chemoradiation with weekly carboplatin and paclitaxel. The patient had significant improvement in her disease but continues to have a residual mass in the right upper lobe.  PLAN: I discussed the scan results and showed the images to the patient and her sister today. I recommended for her consideration of systemic  chemotherapy with carboplatin for AUC of 5 and Alimta 500 mg/M2 every 3 weeks. I discussed with the patient adverse effect of this treatment including but not limited to alopecia, suppression, nausea vomiting, peripheral neuropathy, liver or renal dysfunction. The patient is expected to start the first cycle of this treatment next week. She will receive vitamin B12 injection today. I will call her pharmacy was prescription for folic acid 1 mg by mouth daily as well as Decadron 4 mg by mouth twice a day the day before, day of and day after the chemotherapy. The patient would come back for followup visit in 4 weeks with the start of cycle #2. She was advised to call immediately if she has any concerning symptoms in the interval.  All questions were answered. The patient knows to call the clinic with any problems, questions or concerns. We can certainly see the patient much sooner if necessary.  I spent 15 minutes counseling the patient face to face. The total time spent in the appointment was 25 minutes.

## 2012-03-10 NOTE — Telephone Encounter (Signed)
Gave pt appt for January 2014 and February 2014 lab, ML and chemo

## 2012-03-10 NOTE — Patient Instructions (Addendum)
Your scan showed partial response of her disease in the lung. We discussed systemic chemotherapy with carboplatin and Alimta. First cycle next week. Followup in 4 weeks with the start of cycle #2.

## 2012-03-12 ENCOUNTER — Encounter: Payer: Self-pay | Admitting: Internal Medicine

## 2012-03-12 NOTE — Progress Notes (Signed)
Faxed disability forms to [phone]. Called patient to advise. Patient's line is  busy, will call back.Forward to Medical Records.

## 2012-03-17 ENCOUNTER — Ambulatory Visit: Payer: PRIVATE HEALTH INSURANCE | Admitting: Radiation Oncology

## 2012-03-17 ENCOUNTER — Ambulatory Visit (HOSPITAL_BASED_OUTPATIENT_CLINIC_OR_DEPARTMENT_OTHER): Payer: PRIVATE HEALTH INSURANCE

## 2012-03-17 ENCOUNTER — Other Ambulatory Visit (HOSPITAL_BASED_OUTPATIENT_CLINIC_OR_DEPARTMENT_OTHER): Payer: Medicaid Other | Admitting: Lab

## 2012-03-17 VITALS — BP 112/74 | HR 105 | Temp 97.9°F | Resp 20

## 2012-03-17 DIAGNOSIS — C3410 Malignant neoplasm of upper lobe, unspecified bronchus or lung: Secondary | ICD-10-CM

## 2012-03-17 DIAGNOSIS — C7931 Secondary malignant neoplasm of brain: Secondary | ICD-10-CM

## 2012-03-17 DIAGNOSIS — Z5111 Encounter for antineoplastic chemotherapy: Secondary | ICD-10-CM

## 2012-03-17 DIAGNOSIS — C3490 Malignant neoplasm of unspecified part of unspecified bronchus or lung: Secondary | ICD-10-CM

## 2012-03-17 LAB — CBC WITH DIFFERENTIAL/PLATELET
Basophils Absolute: 0 10*3/uL (ref 0.0–0.1)
Eosinophils Absolute: 0 10*3/uL (ref 0.0–0.5)
HCT: 34.7 % — ABNORMAL LOW (ref 34.8–46.6)
HGB: 11 g/dL — ABNORMAL LOW (ref 11.6–15.9)
LYMPH%: 4 % — ABNORMAL LOW (ref 14.0–49.7)
MONO#: 0.3 10*3/uL (ref 0.1–0.9)
NEUT#: 7.7 10*3/uL — ABNORMAL HIGH (ref 1.5–6.5)
NEUT%: 92.2 % — ABNORMAL HIGH (ref 38.4–76.8)
Platelets: 323 10*3/uL (ref 145–400)
WBC: 8.3 10*3/uL (ref 3.9–10.3)
nRBC: 0 % (ref 0–0)

## 2012-03-17 LAB — COMPREHENSIVE METABOLIC PANEL (CC13)
ALT: <6 U/L (ref 0–55)
BUN: 17 mg/dL (ref 7.0–26.0)
CO2: 24 mEq/L (ref 22–29)
Calcium: 10.3 mg/dL (ref 8.4–10.4)
Chloride: 107 mEq/L (ref 98–107)
Creatinine: 0.8 mg/dL (ref 0.6–1.1)
Glucose: 129 mg/dl — ABNORMAL HIGH (ref 70–99)

## 2012-03-17 MED ORDER — ONDANSETRON 16 MG/50ML IVPB (CHCC)
16.0000 mg | Freq: Once | INTRAVENOUS | Status: AC
  Administered 2012-03-17: 16 mg via INTRAVENOUS

## 2012-03-17 MED ORDER — SODIUM CHLORIDE 0.9 % IV SOLN
369.0000 mg | Freq: Once | INTRAVENOUS | Status: AC
  Administered 2012-03-17: 370 mg via INTRAVENOUS
  Filled 2012-03-17: qty 37

## 2012-03-17 MED ORDER — DEXAMETHASONE SODIUM PHOSPHATE 10 MG/ML IJ SOLN
20.0000 mg | Freq: Once | INTRAMUSCULAR | Status: AC
  Administered 2012-03-17: 20 mg via INTRAVENOUS

## 2012-03-17 MED ORDER — SODIUM CHLORIDE 0.9 % IV SOLN
Freq: Once | INTRAVENOUS | Status: AC
  Administered 2012-03-17: 09:00:00 via INTRAVENOUS

## 2012-03-17 MED ORDER — SODIUM CHLORIDE 0.9 % IV SOLN
500.0000 mg/m2 | Freq: Once | INTRAVENOUS | Status: AC
  Administered 2012-03-17: 700 mg via INTRAVENOUS
  Filled 2012-03-17: qty 28

## 2012-03-17 NOTE — Patient Instructions (Addendum)
[facility] Discharge Instructions for Patients Receiving Chemotherapy  Today you received the following chemotherapy agents carboplatin and alimta  To help prevent nausea and vomiting after your treatment, we encourage you to take your nausea medication as prescribed    If you develop nausea and vomiting that is not controlled by your nausea medication, call the clinic. If it is after clinic hours your family physician or the after hours number for the clinic or go to the Emergency Department.   BELOW ARE SYMPTOMS THAT SHOULD BE REPORTED IMMEDIATELY:  *FEVER GREATER THAN 100.5 F  *CHILLS WITH OR WITHOUT FEVER  NAUSEA AND VOMITING THAT IS NOT CONTROLLED WITH YOUR NAUSEA MEDICATION  *UNUSUAL SHORTNESS OF BREATH  *UNUSUAL BRUISING OR BLEEDING  TENDERNESS IN MOUTH AND THROAT WITH OR WITHOUT PRESENCE OF ULCERS  *URINARY PROBLEMS  *BOWEL PROBLEMS  UNUSUAL RASH Items with * indicate a potential emergency and should be followed up as soon as possible.  One of the nurses will contact you 24 hours after your treatment. Please let the nurse know about any problems that you may have experienced. Feel free to call the clinic you have any questions or concerns. The clinic phone number is [phone].   I have been informed and understand all the instructions given to me. I know to contact the clinic, my physician, or go to the Emergency Department if any problems should occur. I do not have any questions at this time, but understand that I may call the clinic during office hours   should I have any questions or need assistance in obtaining follow up care.    __________________________________________  _____________  __________ Signature of Patient or Authorized Representative            Date                   Time    __________________________________________ Nurse's Signature

## 2012-03-18 ENCOUNTER — Telehealth: Payer: Self-pay | Admitting: Oncology

## 2012-03-18 ENCOUNTER — Telehealth: Payer: Self-pay | Admitting: *Deleted

## 2012-03-18 NOTE — Telephone Encounter (Signed)
Spoke with pt and was informed that pt was doing well post chemo 03/17/12.  Pt stated she is eating well, drinking lots of fluids.  Denied N/V, bowel and bladder function fine.  Pt has antiemetics if needed.  Pt understood to call office with any new problems.

## 2012-03-24 ENCOUNTER — Other Ambulatory Visit (HOSPITAL_BASED_OUTPATIENT_CLINIC_OR_DEPARTMENT_OTHER): Payer: Medicaid Other | Admitting: Lab

## 2012-03-24 DIAGNOSIS — C3490 Malignant neoplasm of unspecified part of unspecified bronchus or lung: Secondary | ICD-10-CM

## 2012-03-24 DIAGNOSIS — C3410 Malignant neoplasm of upper lobe, unspecified bronchus or lung: Secondary | ICD-10-CM

## 2012-03-24 LAB — CBC WITH DIFFERENTIAL/PLATELET
BASO%: 1.1 % (ref 0.0–2.0)
HCT: 36 % (ref 34.8–46.6)
MCHC: 33.7 g/dL (ref 31.5–36.0)
MONO#: 0.3 10*3/uL (ref 0.1–0.9)
NEUT%: 76.5 % (ref 38.4–76.8)
WBC: 4.6 10*3/uL (ref 3.9–10.3)
lymph#: 0.6 10*3/uL — ABNORMAL LOW (ref 0.9–3.3)

## 2012-03-24 LAB — COMPREHENSIVE METABOLIC PANEL (CC13)
ALT: 11 U/L (ref 0–55)
Albumin: 3.8 g/dL (ref 3.5–5.0)
CO2: 25 mEq/L (ref 22–29)
Calcium: 10.6 mg/dL — ABNORMAL HIGH (ref 8.4–10.4)
Chloride: 102 mEq/L (ref 98–107)
Creatinine: 0.8 mg/dL (ref 0.6–1.1)
Total Protein: 7.9 g/dL (ref 6.4–8.3)

## 2012-03-25 ENCOUNTER — Encounter: Payer: Self-pay | Admitting: Radiation Oncology

## 2012-03-28 ENCOUNTER — Ambulatory Visit
Admission: RE | Admit: 2012-03-28 | Discharge: 2012-03-28 | Disposition: A | Discharge status: Home / Self Care | Payer: PRIVATE HEALTH INSURANCE | Source: Ambulatory Visit | Attending: Radiation Oncology | Admitting: Radiation Oncology

## 2012-03-28 ENCOUNTER — Encounter: Payer: Self-pay | Admitting: Internal Medicine

## 2012-03-28 DIAGNOSIS — C7931 Secondary malignant neoplasm of brain: Secondary | ICD-10-CM

## 2012-03-28 MED ORDER — GADOBENATE DIMEGLUMINE 529 MG/ML IV SOLN
9.0000 mL | Freq: Once | INTRAVENOUS | Status: AC | PRN
  Administered 2012-03-28: 9 mL via INTRAVENOUS

## 2012-03-28 NOTE — Progress Notes (Signed)
The disability papers left with Dr. Asa Lente nurse.

## 2012-03-28 NOTE — Progress Notes (Signed)
Quick Note:  Susan, please set-up SRS ______ 

## 2012-03-30 ENCOUNTER — Encounter: Payer: Self-pay | Admitting: Radiation Oncology

## 2012-03-30 NOTE — Progress Notes (Signed)
Radiation Oncology         [phone] ________________________________  Name: Melanie Liu MRN: [id number]  Date: 03/31/2012  DOB: August 25, 1950  Multidisciplinary Neuro Oncology Clinic Follow-Up Visit Note  CC: Melanie Hector, MD  Melanie Catching, MD  Diagnosis:   62 year old woman with stage IIIA adenocarcinoma of the right upper lung with a 17 mm rt frontal brain metastasis along with isolated development of 2 new brain metastases  Interval Since Last Radiation:  6 weeks  Narrative:  The patient returns today for routine follow-up with myself and Dr. Venetia Liu from neurosurgery.  The recent films were presented in our multidisciplinary conference with neuroradiology just prior to the clinic.                              ALLERGIES:  is allergic to levaquin.  Meds: Current Outpatient Prescriptions  Medication Sig Dispense Refill  . calcium-vitamin D (OSCAL WITH D) 500-200 MG-UNIT per tablet Take 1 tablet by mouth daily.      [name] dexamethasone (DECADRON) 4 MG tablet Take 2 mg by mouth daily.      [name] dexamethasone (DECADRON) 4 MG tablet 4 mg by mouth twice a day the day before, day of and day after the chemotherapy every 3 weeks  40 tablet  1  . emollient (BIAFINE) cream Apply topically 2 (two) times daily.      . folic acid (FOLVITE) 1 MG tablet Take 1 tablet (1 mg total) by mouth daily.  30 tablet  5  . glucosamine-chondroitin 500-400 MG tablet Take 1 tablet by mouth daily.       . hyaluronate sodium (RADIAPLEXRX) GEL Apply topically 2 (two) times daily.      [name] oxyCODONE-acetaminophen (PERCOCET/ROXICET) 5-325 MG per tablet Take 1-2 tablets by mouth every 4 (four) hours as needed for pain.  90 tablet  0  . PRESCRIPTION MEDICATION Place into the left eye every 30 (thirty) days. Bevacizumab (Avastin) inj into the left eye      . sucralfate (CARAFATE) 1 G tablet Take 1 tablet (1 g total) by mouth 4 (four) times daily. 5 min before food  90 tablet  5  . vitamin E 400 UNIT capsule Take  400 Units by mouth daily.        Physical Findings: The patient is in no acute distress. Patient is alert and oriented.  vitals were not taken for this visit..  No significant changes.  Lab Findings: Lab Results  Component Value Date   WBC 4.6 03/24/2012   HGB 12.1 03/24/2012   HCT 36.0 03/24/2012   MCV 95.8 03/24/2012   PLT 164 03/24/2012    @LASTCHEM @  Radiographic Findings: Ct Chest W Contrast  03/10/2012  *RADIOLOGY REPORT*  Clinical Data: Lung cancer.  Chemotherapy in progress.  Radiation therapy complete.  CT CHEST WITH CONTRAST  Technique:  Multidetector CT imaging of the chest was performed following the standard protocol during bolus administration of intravenous contrast.  Contrast: 80mL OMNIPAQUE IOHEXOL 300 MG/ML  SOLN  Comparison: Chest CT 11/07/2011.  Findings:  Mediastinum: Heart size is normal. A trace amount of anterior pericardial fluid and/or thickening, unlikely to be of hemodynamic significance at this time.  There is prominent right suprahilar soft tissue which is largely contiguous with the right upper lobe mass.  There is at least one enlarged right hilar lymph node measuring up to 11 mm in short axis (image 24 of series 2).  No other pathologic mediastinal or left hilar lymphadenopathy is noted.  Esophagus is unremarkable in appearance.  Lungs/Pleura: Previously noted mass in the posterior aspect of the right upper lobe has decreased in size, currently measuring 7.3 x 6.0 cm.  This mass makes a broad contact with the pleura along nearly 50% of its circumference, and has slightly spiculated margins with some retraction of the overlying pleura posterolaterally.  This mass also abuts the major fissure, without definite extension into the superior segment of the right lower lobe.  This mass also makes a large amount of contact with the mediastinum where exerts mass effect upon adjacent structures and blurs the fat plane between the lesion and the adjacent esophagus such that some  degree of mediastinal invasion is difficult to entirely exclude.  Distal to the mass within the adjacent portions of the right upper lobe there is some mild postobstructive changes, likely pneumonitis.  There is a background of moderate - severe centrilobular emphysema and mild diffuse bronchial wall thickening. No pleural effusions.  Upper Abdomen: There appears to be a rim calcified gallstone in the gallbladder, however, this is incompletely visualized.  Musculoskeletal: There are no aggressive appearing lytic or blastic lesions noted in the visualized portions of the skeleton. Orthopedic fixation hardware in the lower cervical spine is incidentally noted.  IMPRESSION: 1.  Today's study demonstrates a positive response to therapy with decreased in size of large right upper lobe mass.  There continues to be some right hilar lymphadenopathy, however, previously noted low right paratracheal lymphadenopathy appears decreased in size as well.  No new lesions are otherwise noted. 2.  Cholelithiasis. 3.  Interval development of trace amount of anterior pericardial fluid and/or thickening, unlikely to be of hemodynamic significance at this time. 4.  Mild diffuse bronchial wall thickening with moderate - severe centrilobular emphysema; imaging findings compatible with underlying COPD.   Original Report Authenticated By: Trudie Reed, M.D.    Mr [name] QM Contrast  03/28/2012  *RADIOLOGY REPORT*  Clinical Data: Metastatic lung cancer.  66-month restaging stereotactic radiosurgery.  MRI HEAD WITHOUT AND WITH CONTRAST  Technique:  Multiplanar, multiecho pulse sequences of the brain and surrounding structures were obtained according to standard protocol without and with intravenous contrast  Contrast: 9mL MULTIHANCE GADOBENATE DIMEGLUMINE 529 MG/ML IV SOLN  Comparison: MRI 12/21/2011  Findings: Right frontal mass lesion is considerably smaller now measuring 12 x 10 mm with a thin enhancing wall.  Considerable improvement  in surrounding white matter edema.  New ring-enhancing necrotic lesion right parietal lobe measures 15 x 18 mm.  There is a large amount of surrounding white matter edema.  I do not appreciate any abnormality in this area on the prior study.  New 5 mm enhancing lesion left frontal parietal cortex, with a small amount of surrounding edema.  I do not appreciate any abnormal enhancement in this area on the prior study.  Ventricle size is normal.  No shift of the midline structures.  No acute infarct or hemorrhage.  IMPRESSION: Significant improvement and right frontal lobe metastatic deposit.  New metastatic lesion right parietal lobe measures 15 x 18 mm with considerable surrounding edema.  New 5 mm lesion left frontal parietal cortex.   Original Report Authenticated By: Janeece Riggers, M.D.     Impression:  The patient has two new brain mets which will be amenable to [facility].  At this point, the patient would potentially benefit from radiotherapy. The options include whole brain irradiation versus stereotactic radiosurgery. There are  pros and cons associated with each of these potential treatment options. Whole brain radiotherapy would treat the known metastatic deposits and help provide some reduction of risk for future brain metastases. However, whole brain radiotherapy carries potential risks including hair loss, subacute somnolence, and neurocognitive changes including a possible reduction in short-term memory. Whole brain radiotherapy also may carry a lower likelihood of tumor control at the treatment sites because of the low-dose used. Stereotactic radiosurgery carries a higher likelihood for local tumor control at the targeted sites with lower associated risk for neurocognitive changes such as memory loss. However, the use of stereotactic radiosurgery in this setting may leave the patient at increased risk for new brain metastases elsewhere in the brain as high as 50-60%. Accordingly, patients who receive  stereotactic radiosurgery in this setting should undergo ongoing surveillance imaging with brain MRI more frequently in order to identify and treat new small brain metastases before they become symptomatic. Stereotactic radiosurgery does carry some different risks, including a risk of radionecrosis.  PLAN: Today, I reviewed the findings and workup thus far with the patient. We discussed the dilemma regarding whole brain radiotherapy versus stereotactic radiosurgery. We discussed the pros and cons of each. We also discussed the logistics and delivery of each. We reviewed the results associated with each of the treatments described above. The patient seems to understand the treatment options and would like to proceed with stereotactic radiosurgery.  I spent 15 minutes minutes face to face with the patient and more than 50% of that time was spent in counseling and/or coordination of care.   _____________________________________  Artist Pais. Kathrynn Running, M.D. and  Maeola Harman, M.D.

## 2012-03-31 ENCOUNTER — Encounter: Payer: Self-pay | Admitting: Radiation Oncology

## 2012-03-31 ENCOUNTER — Other Ambulatory Visit (HOSPITAL_BASED_OUTPATIENT_CLINIC_OR_DEPARTMENT_OTHER): Payer: Medicaid Other | Admitting: Lab

## 2012-03-31 ENCOUNTER — Ambulatory Visit
Admission: RE | Admit: 2012-03-31 | Discharge: 2012-03-31 | Disposition: A | Discharge status: Home / Self Care | Payer: PRIVATE HEALTH INSURANCE | Source: Ambulatory Visit | Attending: Radiation Oncology | Admitting: Radiation Oncology

## 2012-03-31 ENCOUNTER — Other Ambulatory Visit: Payer: PRIVATE HEALTH INSURANCE | Admitting: Lab

## 2012-03-31 VITALS — BP 133/75 | HR 113 | Temp 97.6°F | Resp 20 | Wt 101.9 lb

## 2012-03-31 DIAGNOSIS — C3410 Malignant neoplasm of upper lobe, unspecified bronchus or lung: Secondary | ICD-10-CM

## 2012-03-31 DIAGNOSIS — C7949 Secondary malignant neoplasm of other parts of nervous system: Secondary | ICD-10-CM

## 2012-03-31 DIAGNOSIS — R918 Other nonspecific abnormal finding of lung field: Secondary | ICD-10-CM

## 2012-03-31 DIAGNOSIS — C3490 Malignant neoplasm of unspecified part of unspecified bronchus or lung: Secondary | ICD-10-CM

## 2012-03-31 HISTORY — DX: Personal history of irradiation: Z92.3

## 2012-03-31 LAB — CBC WITH DIFFERENTIAL/PLATELET
BASO%: 0.7 % (ref 0.0–2.0)
HCT: 32.5 % — ABNORMAL LOW (ref 34.8–46.6)
MCHC: 33.8 g/dL (ref 31.5–36.0)
MONO#: 0.3 10*3/uL (ref 0.1–0.9)
RBC: 3.35 10*6/uL — ABNORMAL LOW (ref 3.70–5.45)
RDW: 20.1 % — ABNORMAL HIGH (ref 11.2–14.5)
WBC: 3.3 10*3/uL — ABNORMAL LOW (ref 3.9–10.3)
lymph#: 0.5 10*3/uL — ABNORMAL LOW (ref 0.9–3.3)

## 2012-03-31 LAB — COMPREHENSIVE METABOLIC PANEL (CC13)
AST: 12 U/L (ref 5–34)
Albumin: 3.5 g/dL (ref 3.5–5.0)
Alkaline Phosphatase: 77 U/L (ref 40–150)
BUN: 12.5 mg/dL (ref 7.0–26.0)
Calcium: 10.3 mg/dL (ref 8.4–10.4)
Creatinine: 0.7 mg/dL (ref 0.6–1.1)
Glucose: 88 mg/dl (ref 70–99)
Potassium: 4.1 mEq/L (ref 3.5–5.1)

## 2012-03-31 MED ORDER — PROCHLORPERAZINE MALEATE 10 MG PO TABS: 10.0000 mg | ORAL_TABLET | Freq: Four times a day (QID) | ORAL | Status: DC | PRN

## 2012-03-31 MED ORDER — OXYCODONE-ACETAMINOPHEN 5-325 MG PO TABS: 1.0000 | ORAL_TABLET | ORAL | Status: DC | PRN

## 2012-03-31 NOTE — Progress Notes (Signed)
Patient here follow upRUL lung/ SRS  Solitary  Brain mets/r, 12/24/11-02/08/12  Alert,oriented x3, no c/o pain, shortness breath, has non prod cough, c/o nausea secondary to gall bladder attacks stated, requesting refill on percocet 5/325mg  medication, has Avastin  This Wednesday to left eye, only taking  Decadron when taking Chemotherapy ,next due Feb.05/2012, 98% room air sats

## 2012-04-01 ENCOUNTER — Encounter: Payer: Self-pay | Admitting: Internal Medicine

## 2012-04-01 NOTE — Progress Notes (Signed)
Mailed patients disability form, in self addressed envelope, to [employer] attn: [place] @ [address] [place] [address].

## 2012-04-02 ENCOUNTER — Encounter (INDEPENDENT_AMBULATORY_CARE_PROVIDER_SITE_OTHER): Payer: PRIVATE HEALTH INSURANCE | Admitting: Ophthalmology

## 2012-04-02 ENCOUNTER — Ambulatory Visit: Payer: PRIVATE HEALTH INSURANCE

## 2012-04-02 ENCOUNTER — Ambulatory Visit: Payer: PRIVATE HEALTH INSURANCE | Admitting: Radiation Oncology

## 2012-04-03 ENCOUNTER — Encounter: Payer: Self-pay | Admitting: *Deleted

## 2012-04-04 ENCOUNTER — Telehealth: Payer: Self-pay | Admitting: *Deleted

## 2012-04-04 ENCOUNTER — Ambulatory Visit
Admission: RE | Admit: 2012-04-04 | Discharge: 2012-04-04 | Disposition: A | Discharge status: Home / Self Care | Payer: PRIVATE HEALTH INSURANCE | Source: Ambulatory Visit | Attending: Radiation Oncology | Admitting: Radiation Oncology

## 2012-04-04 ENCOUNTER — Encounter: Payer: Self-pay | Admitting: Radiation Oncology

## 2012-04-04 VITALS — BP 119/68 | HR 125 | Temp 98.1°F | Resp 20 | Wt 99.9 lb

## 2012-04-04 DIAGNOSIS — C3410 Malignant neoplasm of upper lobe, unspecified bronchus or lung: Secondary | ICD-10-CM | POA: Insufficient documentation

## 2012-04-04 DIAGNOSIS — C7931 Secondary malignant neoplasm of brain: Secondary | ICD-10-CM | POA: Insufficient documentation

## 2012-04-04 DIAGNOSIS — C7949 Secondary malignant neoplasm of other parts of nervous system: Secondary | ICD-10-CM | POA: Insufficient documentation

## 2012-04-04 DIAGNOSIS — R05 Cough: Secondary | ICD-10-CM

## 2012-04-04 DIAGNOSIS — Z510 Encounter for antineoplastic radiation therapy: Secondary | ICD-10-CM | POA: Insufficient documentation

## 2012-04-04 MED ORDER — HYDROCOD POLST-CHLORPHEN POLST 10-8 MG/5ML PO LQCR: 5.0000 mL | Freq: Two times a day (BID) | ORAL | Status: DC | PRN

## 2012-04-04 MED ORDER — SODIUM CHLORIDE 0.9 % IJ SOLN
10.0000 mL | Freq: Once | INTRAMUSCULAR | Status: AC
  Administered 2012-04-04: 10 mL via INTRAVENOUS

## 2012-04-04 NOTE — Progress Notes (Signed)
Patient brought back to room 3  After CT -Simn, no c/o, d/c heplock, placed 2  2x2 gause over RAC, and taped,, no blood noted, patient to leave on till this afternoon, knows to replace if when removing arm startes to bleed, rand place pressure there,9:59 AM

## 2012-04-04 NOTE — Assessment & Plan Note (Signed)
Planning for [facility]

## 2012-04-04 NOTE — Telephone Encounter (Signed)
Patient had called  From [facility] in [place] waiting on Rx for her cough,returned call and [employer] has been called in by Md,  12:34 PM

## 2012-04-04 NOTE — Progress Notes (Signed)
Patient gave name and date of birth as identification, started IV heplock #22g 1 inch in RAC  x 1 attempt with excellent blood return, patient tolerated well, no c/o headache,blurred vision , just weakness, offered warm blanket,declined but did take water, brought sister back to dressing room 3 to be with patient, informed Arlys John therapist, patient is ready for CT-Sim 8:35 AM

## 2012-04-04 NOTE — Progress Notes (Signed)
  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  Identity was confirmed.  All relevant records and images related to the planned course of therapy were reviewed.  The patient freely provided informed written consent to proceed with treatment after reviewing the details related to the planned course of therapy. The consent form was witnessed and verified by the simulation staff. Intravenous access was established for contrast administration. Then, the patient was set-up in a stable reproducible supine position for radiation therapy.  A relocatable thermoplastic stereotactic head frame was fabricated for precise immobilization.  CT images were obtained.  Surface markings were placed.  The CT images were loaded into the planning software and fused with the patient's targeting MRI scan.  Then the target and avoidance structures were contoured.  Treatment planning then occurred.  The radiation prescription was entered and confirmed.  I have requested 3D planning  I have requested a DVH of the following structures: Brain stem, brain, left eye, right I, lenses, optic chiasm, target volumes, uninvolved brain, and normal tissue.    PLAN:  The patient will receive 20 Gy in one fraction. ________________________________  Artist Pais Kathrynn Running, M.D.

## 2012-04-07 ENCOUNTER — Telehealth: Payer: Self-pay | Admitting: Internal Medicine

## 2012-04-07 ENCOUNTER — Encounter: Payer: Self-pay | Admitting: Physician Assistant

## 2012-04-07 ENCOUNTER — Telehealth: Payer: Self-pay | Admitting: *Deleted

## 2012-04-07 ENCOUNTER — Ambulatory Visit (HOSPITAL_BASED_OUTPATIENT_CLINIC_OR_DEPARTMENT_OTHER): Payer: PRIVATE HEALTH INSURANCE | Admitting: Physician Assistant

## 2012-04-07 ENCOUNTER — Other Ambulatory Visit (HOSPITAL_BASED_OUTPATIENT_CLINIC_OR_DEPARTMENT_OTHER): Payer: PRIVATE HEALTH INSURANCE | Admitting: Lab

## 2012-04-07 ENCOUNTER — Ambulatory Visit (HOSPITAL_BASED_OUTPATIENT_CLINIC_OR_DEPARTMENT_OTHER): Payer: PRIVATE HEALTH INSURANCE

## 2012-04-07 VITALS — BP 105/75 | HR 116 | Temp 96.8°F | Resp 20 | Ht 62.0 in | Wt 99.4 lb

## 2012-04-07 DIAGNOSIS — C3490 Malignant neoplasm of unspecified part of unspecified bronchus or lung: Secondary | ICD-10-CM

## 2012-04-07 DIAGNOSIS — R5383 Other fatigue: Secondary | ICD-10-CM

## 2012-04-07 DIAGNOSIS — C7931 Secondary malignant neoplasm of brain: Secondary | ICD-10-CM

## 2012-04-07 DIAGNOSIS — C3410 Malignant neoplasm of upper lobe, unspecified bronchus or lung: Secondary | ICD-10-CM

## 2012-04-07 DIAGNOSIS — C7949 Secondary malignant neoplasm of other parts of nervous system: Secondary | ICD-10-CM

## 2012-04-07 DIAGNOSIS — Z5111 Encounter for antineoplastic chemotherapy: Secondary | ICD-10-CM

## 2012-04-07 LAB — CBC WITH DIFFERENTIAL/PLATELET
Basophils Absolute: 0 10*3/uL (ref 0.0–0.1)
Eosinophils Absolute: 0 10*3/uL (ref 0.0–0.5)
HCT: 33 % — ABNORMAL LOW (ref 34.8–46.6)
HGB: 10.8 g/dL — ABNORMAL LOW (ref 11.6–15.9)
MCH: 31.5 pg (ref 25.1–34.0)
MCV: 96.2 fL (ref 79.5–101.0)
NEUT#: 2.5 10*3/uL (ref 1.5–6.5)
NEUT%: 68.2 % (ref 38.4–76.8)
RDW: 17.1 % — ABNORMAL HIGH (ref 11.2–14.5)
lymph#: 0.6 10*3/uL — ABNORMAL LOW (ref 0.9–3.3)

## 2012-04-07 LAB — COMPREHENSIVE METABOLIC PANEL (CC13)
Albumin: 3.4 g/dL — ABNORMAL LOW (ref 3.5–5.0)
BUN: 19.5 mg/dL (ref 7.0–26.0)
CO2: 26 mEq/L (ref 22–29)
Calcium: 10.5 mg/dL — ABNORMAL HIGH (ref 8.4–10.4)
Glucose: 132 mg/dl — ABNORMAL HIGH (ref 70–99)
Potassium: 4.4 mEq/L (ref 3.5–5.1)
Sodium: 140 mEq/L (ref 136–145)
Total Protein: 7.6 g/dL (ref 6.4–8.3)

## 2012-04-07 MED ORDER — SODIUM CHLORIDE 0.9 % IV SOLN
370.0000 mg | Freq: Once | INTRAVENOUS | Status: AC
  Administered 2012-04-07: 370 mg via INTRAVENOUS
  Filled 2012-04-07: qty 37

## 2012-04-07 MED ORDER — SODIUM CHLORIDE 0.9 % IV SOLN
Freq: Once | INTRAVENOUS | Status: AC
  Administered 2012-04-07: 10:00:00 via INTRAVENOUS

## 2012-04-07 MED ORDER — ONDANSETRON 16 MG/50ML IVPB (CHCC)
16.0000 mg | Freq: Once | INTRAVENOUS | Status: AC
  Administered 2012-04-07: 16 mg via INTRAVENOUS

## 2012-04-07 MED ORDER — PEMETREXED DISODIUM CHEMO INJECTION 500 MG
500.0000 mg/m2 | Freq: Once | INTRAVENOUS | Status: AC
  Administered 2012-04-07: 700 mg via INTRAVENOUS
  Filled 2012-04-07: qty 28

## 2012-04-07 MED ORDER — DEXAMETHASONE SODIUM PHOSPHATE 4 MG/ML IJ SOLN
20.0000 mg | Freq: Once | INTRAMUSCULAR | Status: AC
  Administered 2012-04-07: 20 mg via INTRAVENOUS

## 2012-04-07 NOTE — Telephone Encounter (Signed)
Per staff message and POF I have scheduled appts.  [name]  

## 2012-04-07 NOTE — Telephone Encounter (Signed)
Gave pt appt for lab , ML and chemo for February 2014

## 2012-04-07 NOTE — Patient Instructions (Signed)
[facility] Discharge Instructions for Patients Receiving Chemotherapy  Today you received the following chemotherapy agents Alimta/Carboplatin To help prevent nausea and vomiting after your treatment, we encourage you to take your nausea medication as prescribed.  If you develop nausea and vomiting that is not controlled by your nausea medication, call the clinic. If it is after clinic hours your family physician or the after hours number for the clinic or go to the Emergency Department.   BELOW ARE SYMPTOMS THAT SHOULD BE REPORTED IMMEDIATELY:  *FEVER GREATER THAN 100.5 F  *CHILLS WITH OR WITHOUT FEVER  NAUSEA AND VOMITING THAT IS NOT CONTROLLED WITH YOUR NAUSEA MEDICATION  *UNUSUAL SHORTNESS OF BREATH  *UNUSUAL BRUISING OR BLEEDING  TENDERNESS IN MOUTH AND THROAT WITH OR WITHOUT PRESENCE OF ULCERS  *URINARY PROBLEMS  *BOWEL PROBLEMS  UNUSUAL RASH Items with * indicate a potential emergency and should be followed up as soon as possible.  One of the nurses will contact you 24 hours after your treatment. Please let the nurse know about any problems that you may have experienced. Feel free to call the clinic you have any questions or concerns. The clinic phone number is [phone].   I have been informed and understand all the instructions given to me. I know to contact the clinic, my physician, or go to the Emergency Department if any problems should occur. I do not have any questions at this time, but understand that I may call the clinic during office hours   should I have any questions or need assistance in obtaining follow up care.    __________________________________________  _____________  __________ Signature of Patient or Authorized Representative            Date                   Time    __________________________________________ Nurse's Signature    

## 2012-04-07 NOTE — Patient Instructions (Addendum)
Continue weekly labs Follow up in 3 weeks prior to your next cycle of chemotherapy 

## 2012-04-07 NOTE — Progress Notes (Signed)
[facility] Telephone:([phone]) [phone]   Fax:([phone]  OFFICE PROGRESS NOTE  [name], MD [address] [place] [address]  DIAGNOSIS: Metastatic non-small cell lung cancer, adenocarcinoma with negative EGFR mutation and negative ALK gene translocation diagnosed in September of 2013 with metastatic single brain lesion.  PRIOR THERAPY:  1) Status post stereotactic radiosurgery to the single brain lesion under the care Dr. Kathrynn Running. 2) Concurrent chemoradiation with chemotherapy in the form of weekly carboplatin for an AUC of 2 and paclitaxel at 45 mg per meter squared concurrent with radiation therapy under the care Dr. Kathrynn Running with partial response in her disease  CURRENT THERAPY: Systemic chemotherapy with carboplatin for AUC of 5 and Alimta 500 mg/M2 every 3 weeks. Status post 1 cycle  INTERVAL HISTORY: Melanie Liu 62 y.o. female returns to the clinic today for followup visit. She reports a persistent dry cough now being managed with a cough syrup prescribed by Dr. Kathrynn Running. She complains of leg weakness that is been present for the past 2 weeks. She denies any back pain, numbness or tingling in the extremities, or any bowel or bladder incontinence. She denied having any falls related to leg weakness. She requests a prescription for a shower chair as well as a handicap parking decal form. She voiced no other specific complaints today.The patient denied having any significant weight loss or night sweats. She denied having any significant chest pain, shortness breath, or hemoptysis.   MEDICAL HISTORY: Past Medical History  Diagnosis Date  . Nodule of right lung     right upper lobe  . Anemia   . Rectovaginal fistula   . Lung cancer 11/23/11    biopsy-right  . Right frontal lobe lesion 12/13/11    CT head  . History of radiation therapy 12/24/11-02/08/12    rul 66Gy/21fxs    ALLERGIES:  is allergic to levaquin.  MEDICATIONS:  Current  Outpatient Prescriptions  Medication Sig Dispense Refill  . calcium-vitamin D (OSCAL WITH D) 500-200 MG-UNIT per tablet Take 1 tablet by mouth daily.      . chlorpheniramine-HYDROcodone (TUSSIONEX PENNKINETIC ER) 10-8 MG/5ML LQCR Take 5 mLs by mouth every 12 (twelve) hours as needed. [place] [place] [facility] [phone]      . chlorpheniramine-HYDROcodone (TUSSIONEX) 10-8 MG/5ML LQCR Take 5 mLs by mouth every 12 (twelve) hours as needed (cough).  480 mL  2  . dexamethasone (DECADRON) 4 MG tablet Take 2 mg by mouth daily.      [name] dexamethasone (DECADRON) 4 MG tablet 4 mg by mouth twice a day the day before, day of and day after the chemotherapy every 3 weeks  40 tablet  1  . emollient (BIAFINE) cream Apply topically 2 (two) times daily.      . folic acid (FOLVITE) 1 MG tablet Take 1 tablet (1 mg total) by mouth daily.  30 tablet  5  . glucosamine-chondroitin 500-400 MG tablet Take 1 tablet by mouth daily.       [name] oxyCODONE-acetaminophen (PERCOCET/ROXICET) 5-325 MG per tablet Take 1-2 tablets by mouth every 4 (four) hours as needed for pain.  120 tablet  0  . PRESCRIPTION MEDICATION Place into the left eye every 30 (thirty) days. Bevacizumab (Avastin) inj into the left eye      . prochlorperazine (COMPAZINE) 10 MG tablet Take 1 tablet (10 mg total) by mouth every 6 (six) hours as needed.  45 tablet  5  . sucralfate (CARAFATE)  1 G tablet Take 1 tablet (1 g total) by mouth 4 (four) times daily. 5 min before food  90 tablet  5  . vitamin E 400 UNIT capsule Take 400 Units by mouth daily.       No current facility-administered medications for this visit.   Facility-Administered Medications Ordered in Other Visits  Medication Dose Route Frequency Provider Last Rate Last Dose  . CARBOplatin (PARAPLATIN) 370 mg in sodium chloride 0.9 % 100 mL chemo infusion  370 mg Intravenous Once Conni Slipper, PA 274 mL/hr at 04/07/12 1118 370 mg at 04/07/12 1118    SURGICAL HISTORY:  Past  Surgical History  Procedure Date  . Partial hysterectomy   . Hemmorhoid surgery   . Neck surgery   . Retinal detachment surgery     REVIEW OF SYSTEMS:  A comprehensive review of systems was negative.   PHYSICAL EXAMINATION: General appearance: alert, cooperative and no distress Head: Normocephalic, without obvious abnormality, atraumatic Neck: no adenopathy Lymph nodes: Cervical, supraclavicular, and axillary nodes normal. Resp: clear to auscultation bilaterally Cardio: regular rate and rhythm, S1, S2 normal, no murmur, click, rub or gallop GI: soft, non-tender; bowel sounds normal; no masses,  no organomegaly Extremities: extremities normal, atraumatic, no cyanosis or edema Neurologic: Alert and oriented X 3, normal strength and tone. Normal symmetric reflexes. Normal coordination and gait Lower extremity strength 3/5 bilaterally, with the left side being slightly weaker than the right side  ECOG PERFORMANCE STATUS: 1 - Symptomatic but completely ambulatory  Blood pressure 105/75, pulse 116, temperature 96.8 F (36 C), temperature source Oral, resp. rate 20, height 5\' 2"  (1.575 m), weight 99 lb 6.4 oz (45.088 kg).  LABORATORY DATA: Lab Results  Component Value Date   WBC 3.6* 04/07/2012   HGB 10.8* 04/07/2012   HCT 33.0* 04/07/2012   MCV 96.2 04/07/2012   PLT 122* 04/07/2012      Chemistry      Component Value Date/Time   NA 140 04/07/2012 0910   K 4.4 04/07/2012 0910   CL 99 04/07/2012 0910   CO2 26 04/07/2012 0910   BUN 19.5 04/07/2012 0910   CREATININE 0.9 04/07/2012 0910      Component Value Date/Time   CALCIUM 10.5* 04/07/2012 0910   ALKPHOS 81 04/07/2012 0910   AST 12 04/07/2012 0910   ALT 7 04/07/2012 0910   BILITOT 0.24 04/07/2012 0910       RADIOGRAPHIC STUDIES: Ct Chest W Contrast  03/10/2012  *RADIOLOGY REPORT*  Clinical Data: Lung cancer.  Chemotherapy in progress.  Radiation therapy complete.  CT CHEST WITH CONTRAST  Technique:  Multidetector CT imaging of the chest was  performed following the standard protocol during bolus administration of intravenous contrast.  Contrast: 80mL OMNIPAQUE IOHEXOL 300 MG/ML  SOLN  Comparison: Chest CT 11/07/2011.  Findings:  Mediastinum: Heart size is normal. A trace amount of anterior pericardial fluid and/or thickening, unlikely to be of hemodynamic significance at this time.  There is prominent right suprahilar soft tissue which is largely contiguous with the right upper lobe mass.  There is at least one enlarged right hilar lymph node measuring up to 11 mm in short axis (image 24 of series 2).  No other pathologic mediastinal or left hilar lymphadenopathy is noted.  Esophagus is unremarkable in appearance.  Lungs/Pleura: Previously noted mass in the posterior aspect of the right upper lobe has decreased in size, currently measuring 7.3 x 6.0 cm.  This mass makes a broad contact with the pleura  along nearly 50% of its circumference, and has slightly spiculated margins with some retraction of the overlying pleura posterolaterally.  This mass also abuts the major fissure, without definite extension into the superior segment of the right lower lobe.  This mass also makes a large amount of contact with the mediastinum where exerts mass effect upon adjacent structures and blurs the fat plane between the lesion and the adjacent esophagus such that some degree of mediastinal invasion is difficult to entirely exclude.  Distal to the mass within the adjacent portions of the right upper lobe there is some mild postobstructive changes, likely pneumonitis.  There is a background of moderate - severe centrilobular emphysema and mild diffuse bronchial wall thickening. No pleural effusions.  Upper Abdomen: There appears to be a rim calcified gallstone in the gallbladder, however, this is incompletely visualized.  Musculoskeletal: There are no aggressive appearing lytic or blastic lesions noted in the visualized portions of the skeleton. Orthopedic fixation  hardware in the lower cervical spine is incidentally noted.  IMPRESSION: 1.  Today's study demonstrates a positive response to therapy with decreased in size of large right upper lobe mass.  There continues to be some right hilar lymphadenopathy, however, previously noted low right paratracheal lymphadenopathy appears decreased in size as well.  No new lesions are otherwise noted. 2.  Cholelithiasis. 3.  Interval development of trace amount of anterior pericardial fluid and/or thickening, unlikely to be of hemodynamic significance at this time. 4.  Mild diffuse bronchial wall thickening with moderate - severe centrilobular emphysema; imaging findings compatible with underlying COPD.   Original Report Authenticated By: Trudie Reed, M.D.     ASSESSMENT/PLAN:  This is a very pleasant 62 years old white female with metastatic non-small cell lung cancer, adenocarcinoma with negative EGFR mutation and negative ALK gene translocation is status post a stereotactic radiotherapy to the brain lesion followed by concurrent chemoradiation with weekly carboplatin and paclitaxel. The patient had significant improvement in her disease but continues to have a residual mass in the right upper lobe. She's now being treated with systemic chemotherapy in the form of carboplatin for an AUC of 5 and Alimta 500 mg per meter square given every 3 weeks status post 1 cycle. Patient was discussed with [name]. She'll proceed with cycle #2 of her systemic chemotherapy with carboplatin and Alimta. She will continue with weekly labs consisting of a CBC differential and C. met. She was given a prescription for a shower chair and also given a permanent handicap parking decal form. She'll followup in 3 weeks prior to cycle #3 of her systemic chemotherapy with carboplatin and Alimta. The patient is to keep [place] informed on her lower extremity weakness.  Laural Benes, Keonda Dow E, PA-C   All questions were answered. The patient knows to call the  clinic with any problems, questions or concerns. We can certainly see the patient much sooner if necessary.  I spent 20 minutes counseling the patient face to face. The total time spent in the appointment was 30 minutes.

## 2012-04-08 ENCOUNTER — Telehealth: Payer: Self-pay | Admitting: Radiation Oncology

## 2012-04-08 NOTE — Telephone Encounter (Signed)
[employer] Assessment form(s) for pre-certing and clinicals complete and faxed to: [phone]   Chinita Pester, RN  Ph: [phone]  AUTH PENDING NBR: M [id number] [phone] [id number]        COPY SCANNED

## 2012-04-09 ENCOUNTER — Encounter (INDEPENDENT_AMBULATORY_CARE_PROVIDER_SITE_OTHER): Payer: PRIVATE HEALTH INSURANCE | Admitting: Ophthalmology

## 2012-04-09 DIAGNOSIS — H43819 Vitreous degeneration, unspecified eye: Secondary | ICD-10-CM

## 2012-04-09 DIAGNOSIS — H353290 Exudative age-related macular degeneration, unspecified eye, stage unspecified: Secondary | ICD-10-CM

## 2012-04-09 DIAGNOSIS — H3530 Unspecified macular degeneration: Secondary | ICD-10-CM

## 2012-04-09 DIAGNOSIS — H2510 Age-related nuclear cataract, unspecified eye: Secondary | ICD-10-CM

## 2012-04-09 DIAGNOSIS — H33009 Unspecified retinal detachment with retinal break, unspecified eye: Secondary | ICD-10-CM

## 2012-04-10 ENCOUNTER — Emergency Department (HOSPITAL_COMMUNITY): Payer: PRIVATE HEALTH INSURANCE

## 2012-04-10 ENCOUNTER — Ambulatory Visit: Payer: PRIVATE HEALTH INSURANCE | Admitting: Radiation Oncology

## 2012-04-10 ENCOUNTER — Telehealth: Payer: Self-pay | Admitting: Medical Oncology

## 2012-04-10 ENCOUNTER — Encounter (HOSPITAL_COMMUNITY): Payer: Self-pay | Admitting: *Deleted

## 2012-04-10 ENCOUNTER — Other Ambulatory Visit: Payer: Self-pay

## 2012-04-10 ENCOUNTER — Inpatient Hospital Stay (HOSPITAL_COMMUNITY)
Admission: EM | Admit: 2012-04-10 | Discharge: 2012-04-15 | DRG: 189 | Disposition: A | Discharge status: Home / Self Care | Payer: PRIVATE HEALTH INSURANCE | Attending: Internal Medicine | Admitting: Internal Medicine

## 2012-04-10 DIAGNOSIS — IMO0002 Reserved for concepts with insufficient information to code with codable children: Secondary | ICD-10-CM

## 2012-04-10 DIAGNOSIS — G939 Disorder of brain, unspecified: Secondary | ICD-10-CM

## 2012-04-10 DIAGNOSIS — R0602 Shortness of breath: Secondary | ICD-10-CM

## 2012-04-10 DIAGNOSIS — F17200 Nicotine dependence, unspecified, uncomplicated: Secondary | ICD-10-CM | POA: Diagnosis present

## 2012-04-10 DIAGNOSIS — T45515A Adverse effect of anticoagulants, initial encounter: Secondary | ICD-10-CM | POA: Diagnosis present

## 2012-04-10 DIAGNOSIS — Z9221 Personal history of antineoplastic chemotherapy: Secondary | ICD-10-CM

## 2012-04-10 DIAGNOSIS — D630 Anemia in neoplastic disease: Secondary | ICD-10-CM | POA: Diagnosis present

## 2012-04-10 DIAGNOSIS — C3410 Malignant neoplasm of upper lobe, unspecified bronchus or lung: Secondary | ICD-10-CM | POA: Diagnosis present

## 2012-04-10 DIAGNOSIS — D696 Thrombocytopenia, unspecified: Secondary | ICD-10-CM | POA: Diagnosis present

## 2012-04-10 DIAGNOSIS — D6959 Other secondary thrombocytopenia: Secondary | ICD-10-CM | POA: Diagnosis present

## 2012-04-10 DIAGNOSIS — C3490 Malignant neoplasm of unspecified part of unspecified bronchus or lung: Secondary | ICD-10-CM

## 2012-04-10 DIAGNOSIS — J441 Chronic obstructive pulmonary disease with (acute) exacerbation: Secondary | ICD-10-CM | POA: Diagnosis present

## 2012-04-10 DIAGNOSIS — J189 Pneumonia, unspecified organism: Secondary | ICD-10-CM | POA: Diagnosis present

## 2012-04-10 DIAGNOSIS — Z90710 Acquired absence of both cervix and uterus: Secondary | ICD-10-CM

## 2012-04-10 DIAGNOSIS — J9600 Acute respiratory failure, unspecified whether with hypoxia or hypercapnia: Principal | ICD-10-CM | POA: Diagnosis present

## 2012-04-10 DIAGNOSIS — Z79899 Other long term (current) drug therapy: Secondary | ICD-10-CM

## 2012-04-10 DIAGNOSIS — C7949 Secondary malignant neoplasm of other parts of nervous system: Secondary | ICD-10-CM | POA: Diagnosis present

## 2012-04-10 DIAGNOSIS — R918 Other nonspecific abnormal finding of lung field: Secondary | ICD-10-CM | POA: Diagnosis present

## 2012-04-10 DIAGNOSIS — C7931 Secondary malignant neoplasm of brain: Secondary | ICD-10-CM | POA: Diagnosis present

## 2012-04-10 DIAGNOSIS — E440 Moderate protein-calorie malnutrition: Secondary | ICD-10-CM | POA: Diagnosis present

## 2012-04-10 DIAGNOSIS — Y92009 Unspecified place in unspecified non-institutional (private) residence as the place of occurrence of the external cause: Secondary | ICD-10-CM

## 2012-04-10 DIAGNOSIS — Z923 Personal history of irradiation: Secondary | ICD-10-CM

## 2012-04-10 DIAGNOSIS — Z881 Allergy status to other antibiotic agents status: Secondary | ICD-10-CM

## 2012-04-10 LAB — COMPREHENSIVE METABOLIC PANEL
ALT: 8 U/L (ref 0–35)
AST: 16 U/L (ref 0–37)
Alkaline Phosphatase: 71 U/L (ref 39–117)
CO2: 27 mEq/L (ref 19–32)
Calcium: 9.4 mg/dL (ref 8.4–10.5)
Chloride: 99 mEq/L (ref 96–112)
GFR calc non Af Amer: >90 mL/min (ref >90)
Potassium: 4 mEq/L (ref 3.5–5.1)
Sodium: 136 mEq/L (ref 135–145)

## 2012-04-10 LAB — CBC WITH DIFFERENTIAL/PLATELET
Basophils Relative: 0 % (ref 0–1)
Eosinophils Absolute: 0 10*3/uL (ref 0.0–0.7)
Eosinophils Relative: 0 % (ref 0–5)
HCT: 32.9 % — ABNORMAL LOW (ref 36.0–46.0)
Lymphocytes Relative: 14 % (ref 12–46)
Lymphs Abs: 0.6 10*3/uL — ABNORMAL LOW (ref 0.7–4.0)
MCH: 32.7 pg (ref 26.0–34.0)
MCH: 31.7 pg (ref 26.0–34.0)
MCHC: 33.9 g/dL (ref 30.0–36.0)
MCV: 95.6 fL (ref 78.0–100.0)
Monocytes Absolute: 0.1 10*3/uL (ref 0.1–1.0)
Neutrophils Relative %: 91 % — ABNORMAL HIGH (ref 43–77)
Platelets: 127 10*3/uL — ABNORMAL LOW (ref 150–400)
RBC: 3.44 MIL/uL — ABNORMAL LOW (ref 3.87–5.11)
RDW: 16.8 % — ABNORMAL HIGH (ref 11.5–15.5)
RDW: 16.8 % — ABNORMAL HIGH (ref 11.5–15.5)
WBC: 4.2 10*3/uL (ref 4.0–10.5)

## 2012-04-10 LAB — POCT I-STAT, CHEM 8
Creatinine, Ser: 1 mg/dL (ref 0.50–1.10)
Hemoglobin: 11.6 g/dL — ABNORMAL LOW (ref 12.0–15.0)
Sodium: 136 mEq/L (ref 135–145)
TCO2: 30 mmol/L (ref 0–100)

## 2012-04-10 LAB — TROPONIN I: Troponin I: <0.3 ng/mL (ref <0.30)

## 2012-04-10 MED ORDER — SUCRALFATE 1 G PO TABS
1.0000 g | ORAL_TABLET | ORAL | Status: DC
  Administered 2012-04-11 – 2012-04-14 (×2): 1 g via ORAL
  Filled 2012-04-10 (×4): qty 1

## 2012-04-10 MED ORDER — DEXTROSE 5 % IV SOLN
1.0000 g | INTRAVENOUS | Status: DC
  Administered 2012-04-10 – 2012-04-13 (×4): 1 g via INTRAVENOUS
  Filled 2012-04-10 (×5): qty 10

## 2012-04-10 MED ORDER — ASPIRIN 81 MG PO CHEW
162.0000 mg | CHEWABLE_TABLET | Freq: Once | ORAL | Status: AC
  Administered 2012-04-10: 162 mg via ORAL
  Filled 2012-04-10: qty 2

## 2012-04-10 MED ORDER — ACETAMINOPHEN 325 MG PO TABS: 650.0000 mg | ORAL_TABLET | Freq: Four times a day (QID) | ORAL | Status: DC | PRN

## 2012-04-10 MED ORDER — ALBUTEROL SULFATE (5 MG/ML) 0.5% IN NEBU
2.5000 mg | INHALATION_SOLUTION | RESPIRATORY_TRACT | Status: DC
  Administered 2012-04-10 – 2012-04-11 (×5): 2.5 mg via RESPIRATORY_TRACT
  Filled 2012-04-10 (×5): qty 0.5

## 2012-04-10 MED ORDER — EMOLLIENT BASE EX CREA: TOPICAL_CREAM | Freq: Two times a day (BID) | CUTANEOUS | Status: DC

## 2012-04-10 MED ORDER — FOLIC ACID 1 MG PO TABS
1.0000 mg | ORAL_TABLET | Freq: Every day | ORAL | Status: DC
  Administered 2012-04-12 – 2012-04-15 (×4): 1 mg via ORAL
  Filled 2012-04-10 (×6): qty 1

## 2012-04-10 MED ORDER — ENOXAPARIN SODIUM 40 MG/0.4ML ~~LOC~~ SOLN
40.0000 mg | SUBCUTANEOUS | Status: DC
  Administered 2012-04-10: 40 mg via SUBCUTANEOUS
  Filled 2012-04-10 (×2): qty 0.4

## 2012-04-10 MED ORDER — GLUCOSAMINE-CHONDROITIN 500-400 MG PO TABS: 1.0000 | ORAL_TABLET | Freq: Every day | ORAL | Status: DC

## 2012-04-10 MED ORDER — ONDANSETRON HCL 4 MG PO TABS: 4.0000 mg | ORAL_TABLET | Freq: Four times a day (QID) | ORAL | Status: DC | PRN

## 2012-04-10 MED ORDER — MORPHINE SULFATE 2 MG/ML IJ SOLN: 1.0000 mg | INTRAMUSCULAR | Status: DC | PRN

## 2012-04-10 MED ORDER — ACETAMINOPHEN 650 MG RE SUPP: 650.0000 mg | Freq: Four times a day (QID) | RECTAL | Status: DC | PRN

## 2012-04-10 MED ORDER — CALCIUM CARBONATE-VITAMIN D 500-200 MG-UNIT PO TABS
1.0000 | ORAL_TABLET | Freq: Every day | ORAL | Status: DC
  Administered 2012-04-12 – 2012-04-15 (×4): 1 via ORAL
  Filled 2012-04-10 (×6): qty 1

## 2012-04-10 MED ORDER — GUAIFENESIN 100 MG/5ML PO SOLN
400.0000 mg | Freq: Four times a day (QID) | ORAL | Status: DC | PRN
  Administered 2012-04-10 – 2012-04-13 (×5): 400 mg via ORAL
  Filled 2012-04-10 (×2): qty 118
  Filled 2012-04-10 (×2): qty 10
  Filled 2012-04-10: qty 20
  Filled 2012-04-10 (×2): qty 10

## 2012-04-10 MED ORDER — METHYLPREDNISOLONE SODIUM SUCC 125 MG IJ SOLR
60.0000 mg | Freq: Two times a day (BID) | INTRAMUSCULAR | Status: DC
  Administered 2012-04-10 – 2012-04-11 (×3): 60 mg via INTRAVENOUS
  Filled 2012-04-10 (×5): qty 0.96

## 2012-04-10 MED ORDER — VITAMIN E 180 MG (400 UNIT) PO CAPS
400.0000 [IU] | ORAL_CAPSULE | Freq: Every day | ORAL | Status: DC
  Administered 2012-04-10 – 2012-04-15 (×5): 400 [IU] via ORAL
  Filled 2012-04-10 (×6): qty 1

## 2012-04-10 MED ORDER — GUAIFENESIN ER 600 MG PO TB12
600.0000 mg | ORAL_TABLET | Freq: Two times a day (BID) | ORAL | Status: DC
  Administered 2012-04-12: 600 mg via ORAL
  Filled 2012-04-10 (×5): qty 1

## 2012-04-10 MED ORDER — SODIUM CHLORIDE 0.9 % IV SOLN
INTRAVENOUS | Status: DC
  Administered 2012-04-10: 1000 mL via INTRAVENOUS
  Administered 2012-04-10 – 2012-04-15 (×9): via INTRAVENOUS

## 2012-04-10 MED ORDER — IPRATROPIUM BROMIDE 0.02 % IN SOLN
0.5000 mg | RESPIRATORY_TRACT | Status: DC
  Administered 2012-04-10 – 2012-04-11 (×5): 0.5 mg via RESPIRATORY_TRACT
  Filled 2012-04-10 (×5): qty 2.5

## 2012-04-10 MED ORDER — ONDANSETRON HCL 4 MG/2ML IJ SOLN
4.0000 mg | Freq: Four times a day (QID) | INTRAMUSCULAR | Status: DC | PRN
  Administered 2012-04-10 – 2012-04-11 (×2): 4 mg via INTRAVENOUS
  Filled 2012-04-10 (×2): qty 2

## 2012-04-10 MED ORDER — PRUTECT EX EMUL
Freq: Two times a day (BID) | CUTANEOUS | Status: DC
  Administered 2012-04-11: 10:00:00 via TOPICAL
  Administered 2012-04-11: 1 via TOPICAL
  Administered 2012-04-12 – 2012-04-15 (×6): via TOPICAL
  Filled 2012-04-10: qty 45

## 2012-04-10 MED ORDER — SODIUM CHLORIDE 0.9 % IV BOLUS (SEPSIS)
1000.0000 mL | Freq: Once | INTRAVENOUS | Status: AC
  Administered 2012-04-10: 1000 mL via INTRAVENOUS

## 2012-04-10 MED ORDER — DEXTROSE 5 % IV SOLN
500.0000 mg | INTRAVENOUS | Status: DC
  Administered 2012-04-10 – 2012-04-12 (×3): 500 mg via INTRAVENOUS
  Filled 2012-04-10 (×3): qty 500

## 2012-04-10 MED ORDER — GUAIFENESIN ER 600 MG PO TB12
600.0000 mg | ORAL_TABLET | Freq: Two times a day (BID) | ORAL | Status: DC
  Filled 2012-04-10: qty 1

## 2012-04-10 MED ORDER — SODIUM CHLORIDE 0.9 % IV SOLN: Freq: Once | INTRAVENOUS | Status: AC

## 2012-04-10 MED ORDER — IOHEXOL 350 MG/ML SOLN
80.0000 mL | Freq: Once | INTRAVENOUS | Status: AC | PRN
  Administered 2012-04-10: 80 mL via INTRAVENOUS

## 2012-04-10 MED ORDER — OXYCODONE-ACETAMINOPHEN 5-325 MG PO TABS: 1.0000 | ORAL_TABLET | ORAL | Status: DC | PRN

## 2012-04-10 NOTE — ED Notes (Signed)
Pt reports feeling sob when coughing. Nonproductive x6 days. Shob and weakness since last night. Started new chemo Monday (4 days ago). Has had 2 rounds. Feels generally weak. Was supposed to have radiation today but felt too weak to have it. Denies n/v.

## 2012-04-10 NOTE — Progress Notes (Signed)
PHARMACIST - PHYSICIAN ORDER COMMUNICATION  CONCERNING: P&T Medication Policy on Herbal Medications  DESCRIPTION:  This patient's order for:  Glucosamine/chondroitin  has been noted.  This product(s) is classified as an "herbal" or natural product. Due to a lack of definitive safety studies or FDA approval, nonstandard manufacturing practices, plus the potential risk of unknown drug-drug interactions while on inpatient medications, the Pharmacy and Therapeutics Committee does not permit the use of "herbal" or natural products of this type within [facility].   ACTION TAKEN: The pharmacy department is unable to verify this order at this time and your patient has been informed of this safety policy. Please reevaluate patient's clinical condition at discharge and address if the herbal or natural product(s) should be resumed at that time.  Charolotte Eke, PharmD, pager [phone]. 04/10/2012,4:23 PM.

## 2012-04-10 NOTE — ED Notes (Signed)
Patient ambulated 40 feet with no assistance. Patients O2 saturation was at 95%. Patient heart rate started at 111 beats per minute and increased to 124 beats per minute.

## 2012-04-10 NOTE — ED Notes (Signed)
Pt reports she is being treated with chemo for lung cancer, last chemo treated on Monday. Was scheduled to have brain radiation but reports she is too weak. Pt reports weakness since chemo treatment and cough. Non productive, but pt is SOB. 96% on room air. Denies pain. Reports increased weakness in left leg.

## 2012-04-10 NOTE — ED Notes (Signed)
Pt in CT.

## 2012-04-10 NOTE — Telephone Encounter (Signed)
Dr Mohamed notified. 

## 2012-04-10 NOTE — H&P (Addendum)
[facility] History and Physical  Melanie Liu:[id number] DOB: 1951/01/18 DOA: 04/10/2012  Referring physician: ER physician PCP: Melanie Hector, MD   Chief Complaint: Shortness of breath  HPI:  62 year old female with past medical history including but not limited to lung cancer, on current chemotherapy, COPD who presented with worsening shortness of breath over past 24 hours associated with dry cough. Patient reports no associated chest pain or palpitations. No fever but she did report chills. No complaints of abdominal pain, nausea or vomiting. No reports of blood in the stool or urine. Patient does report feeling very fatigued and having difficult time getting up and ambulating. No dizziness, lightheadedness or loss of consciousness. In ED, evaluation included CT angio chest which ruled out pulmonary embolism. Chest x-ray shows a right upper lobe mass which is consistent with patient's history of lung cancer. BMP essentially unremarkable. Hemoglobin was  11.6.  Assessment and plan:  Principal problem *Acute hypoxic respiratory failure  Secondary to COPD exacerbation  Start nebulizer treatments every 4 hours scheduled  Start Solu-Medrol 60 mg IV every 12 hours  Start azithromycin and ceftriaxone for possible community-acquired pneumonia  Pneumonia order set in place, please followup labs associated with that  Oxygen support via nasal cannula to keep oxygen saturation above 90%  Active problems Lung cancer right upper lobe with brain metastasis  Receiving chemotherapy; will notify [name] of patient's admission  Anemia  Secondary to chronic disease secondary to history of lung cancer  Hemoglobin stable at 11.6  Protein calorie malnutrition  Nutrition consulted  Manson Passey [facility] [phone]  Review of Systems:  Constitutional: Negative for fever, chills and malaise/fatigue. Negative for diaphoresis.  HENT: Negative for hearing loss, ear pain,  nosebleeds, congestion, sore throat, neck pain, tinnitus and ear discharge.   Eyes: Negative for blurred vision, double vision, photophobia, pain, discharge and redness.  Respiratory: per HPI.   Cardiovascular: Negative for chest pain, palpitations, orthopnea, claudication and leg swelling.  Gastrointestinal: Negative for nausea, vomiting and abdominal pain. Negative for heartburn, constipation, blood in stool and melena.  Genitourinary: Negative for dysuria, urgency, frequency, hematuria and flank pain.  Musculoskeletal: Negative for myalgias, back pain, joint pain and falls.  Skin: Negative for itching and rash.  Neurological: Negative for dizziness and weakness. Negative for tingling, tremors, sensory change, speech change, focal weakness, loss of consciousness and headaches.  Endo/Heme/Allergies: Negative for environmental allergies and polydipsia. Does not bruise/bleed easily.  Psychiatric/Behavioral: Negative for suicidal ideas. The patient is not nervous/anxious.      Past Medical History  Diagnosis Date  . Nodule of right lung     right upper lobe  . Anemia   . Rectovaginal fistula   . Lung cancer 11/23/11    biopsy-right  . Right frontal lobe lesion 12/13/11    CT head  . History of radiation therapy 12/24/11-02/08/12    rul 66Gy/16fxs   Past Surgical History  Procedure Date  . Partial hysterectomy   . Hemmorhoid surgery   . Neck surgery   . Retinal detachment surgery    Social History:  reports that she has been smoking Cigarettes.  She has a 43 pack-year smoking history. She has never used smokeless tobacco. She reports that she does not drink alcohol or use illicit drugs.  Allergies  Allergen Reactions  . Melanie Liu (Levofloxacin In D5w) Rash    itching    Family History:  Family History  Problem Relation Age of Onset  . Heart disease Father   . Kidney  disease Mother   . Alzheimer's disease Sister   . Alzheimer's disease Brother      Prior to Admission  medications   Medication Sig Start Date End Date Taking? Authorizing Provider  calcium-vitamin D (OSCAL WITH D) 500-200 MG-UNIT per tablet Take 1 tablet by mouth daily.   Yes Historical Provider, MD  chlorpheniramine-HYDROcodone (TUSSIONEX PENNKINETIC ER) 10-8 MG/5ML LQCR Take 5 mLs by mouth every 12 (twelve) hours as needed. Called in by MD to Melanie Liu ,[place]. [phone] 04/04/12  Yes Melanie Hurt, MD  dexamethasone (DECADRON) 4 MG tablet 4 mg by mouth twice a day the day before, day of and day after the chemotherapy every 3 weeks 03/10/12  Yes Melanie Gaul, MD  emollient (BIAFINE) cream Apply topically 2 (two) times daily.   Yes Historical Provider, MD  folic acid (FOLVITE) 1 MG tablet Take 1 tablet (1 mg total) by mouth daily. 03/10/12  Yes Melanie Gaul, MD  glucosamine-chondroitin 500-400 MG tablet Take 1 tablet by mouth daily.    Yes Historical Provider, MD  oxyCODONE-acetaminophen (PERCOCET/ROXICET) 5-325 MG per tablet Take 1-2 tablets by mouth every 4 (four) hours as needed for pain. 03/31/12  Yes Melanie Hurt, MD  PRESCRIPTION MEDICATION Place into the left eye every 30 (thirty) days. Bevacizumab (Avastin) inj into the left eye   Yes Historical Provider, MD  sucralfate (CARAFATE) 1 G tablet Take 1 g by mouth 3 (three) times a week. 5 min before food 01/04/12  Yes Melanie Hurt, MD  vitamin E 400 UNIT capsule Take 400 Units by mouth daily.   Yes Historical Provider, MD   Physical Exam: Filed Vitals:   04/10/12 0856 04/10/12 1244  BP: 112/82 118/68  Pulse: 118 98  Temp: 97.8 F (36.6 C)   TempSrc: Oral   Resp: 16 20  SpO2: 95% 94%    Physical Exam  Constitutional: Appears well-developed and well-nourished. No distress.  HENT: Normocephalic. External right and left ear normal. Oropharynx is clear and moist.  Eyes: Conjunctivae and EOM are normal. PERRLA, no scleral icterus.  Neck: Normal ROM. Neck supple. No JVD. No tracheal deviation. No thyromegaly.  CVS: RRR,  S1/S2 +, no murmurs, no gallops, no carotid bruit.  Pulmonary: Diminished breath sounds throughout, wheezing and rales and upper lung lobes.  Abdominal: Soft. BS +,  no distension, tenderness, rebound or guarding.  Musculoskeletal: Normal range of motion. No edema and no tenderness.  Lymphadenopathy: No lymphadenopathy noted, cervical, inguinal. Neuro: Alert. Normal reflexes, muscle tone coordination. No cranial nerve deficit. Skin: Skin is warm and dry. No rash noted. Not diaphoretic. No erythema. No pallor.  Psychiatric: Normal mood and affect. Behavior, judgment, thought content normal.   Labs on Admission:  Basic Metabolic Panel:  Lab 04/10/12 0981 04/07/12 0910  NA 136 140  K 4.2 4.4  CL 101 99  CO2 -- 26  GLUCOSE 96 132*  BUN 21 19.5  CREATININE 1.00 0.9  CALCIUM -- 10.5*  MG -- --  PHOS -- --   Liver Function Tests:  Lab 04/07/12 0910  AST 12  ALT 7  ALKPHOS 81  BILITOT 0.24  PROT 7.6  ALBUMIN 3.4*   No results found for this basename: LIPASE:5,AMYLASE:5 in the last 168 hours No results found for this basename: AMMONIA:5 in the last 168 hours CBC:  Lab 04/10/12 1034 04/10/12 1025 04/07/12 0909  WBC -- 5.6 3.6*  NEUTROABS -- 5.1 2.5  HGB 11.6* 11.3* 10.8*  HCT 34.0* 33.3* 33.0*  MCV --  96.2 96.2  PLT -- 127* 122*   Cardiac Enzymes:  Lab 04/10/12 1025  CKTOTAL --  CKMB --  CKMBINDEX --  TROPONINI <0.30   BNP: No components found with this basename: POCBNP:5 CBG: No results found for this basename: GLUCAP:5 in the last 168 hours  Radiological Exams on Admission: Dg Chest 2 View  04/10/2012  *RADIOLOGY REPORT*  Clinical Data: Short of breath  CHEST - 2 VIEW  Comparison: 11/23/2011  Findings: Thorax is rotated to the right limiting the study.  Right upper lobe mass is smaller.  Lungs remain hyperaerated.  Normal heart size.  No pneumothorax.  IMPRESSION: Thorax rotated to the right limiting the study.  Right upper lobe mass is smaller compatible with a  positive response to therapy.   Original Report Authenticated By: Jolaine Click, M.D.    Ct Angio Chest Pe W/cm &/or Wo Cm  04/10/2012  *RADIOLOGY REPORT*  Clinical Data: History of lung carcinoma, shortness of breath, cough  CT ANGIOGRAPHY CHEST  Technique:  Multidetector CT imaging of the chest using the standard protocol during bolus administration of intravenous contrast. Multiplanar reconstructed images including MIPs were obtained and reviewed to evaluate the vascular anatomy.  Contrast: 80mL OMNIPAQUE IOHEXOL 350 MG/ML SOLN  Comparison: 03/10/2012  Findings: Diffuse emphysematous changes are noted within the lungs bilaterally.  There is a persistent right upper lobe mass lesion identified which measures approximately 6.9 x 5.6 cm.  This is slightly decreased in size when compared with the prior exam. It causes some narrowing of the bronchial tree on the right and extends into the hilum as previously described.  The thoracic aorta is well visualized within normal branching pattern.  No findings to suggest aneurysm or aortic dissection are seen.  The pulmonary artery is also well visualized and demonstrates a normal enhancement pattern.  The arterial branches in the right upper lobe are somewhat displaced by the patient's known mass lesion.  No filling defects to suggest pulmonary emboli are identified bilaterally.  Degenerative changes of the thoracic spine are seen.  No acute bony abnormality is noted.  IMPRESSION: No evidence of pulmonary emboli.  Persistent right upper lobe mass lesion which is slightly decreased in size when compared with the prior exam.  No new focal abnormality is noted.   Original Report Authenticated By: Alcide Clever, M.D.     EKG: Normal sinus rhythm, no ST/T wave changes  Code Status: Full Family Communication: Pt at bedside Disposition Plan: Admit for further evaluation  Manson Passey, MD  [facility] Pager [phone]  If 7PM-7AM, please contact  night-coverage [email or web address] Password [email or web address] 04/10/2012, 2:17 PM

## 2012-04-10 NOTE — ED Provider Notes (Addendum)
History     CSN: [id number]  Arrival date & time 04/10/12  4098   First MD Initiated Contact with Patient 04/10/12 [phone]      Chief Complaint  Patient presents with  . cancer pt   . Shortness of Breath  . Cough    (Consider location/radiation/quality/duration/timing/severity/associated sxs/prior treatment) HPI Comments: PT comes in with cc of DIB. Pt has hx of lung ca, on chemo. States that she has been having some cough for the past few days, mostly dry, no hemoptysis, but last night she started getting sob. Pt has no chest pain, the dib is not exertional and she has no associated n/v/f/c. Pt has no hx of PE, DVT. No hx of CAD.  Patient is a 62 y.o. female presenting with shortness of breath and cough. The history is provided by the patient.  Shortness of Breath  Associated symptoms include cough and shortness of breath. Pertinent negatives include no chest pain and no wheezing.  Cough Associated symptoms include shortness of breath. Pertinent negatives include no chest pain and no wheezing.    Past Medical History  Diagnosis Date  . Nodule of right lung     right upper lobe  . Anemia   . Rectovaginal fistula   . Lung cancer 11/23/11    biopsy-right  . Right frontal lobe lesion 12/13/11    CT head  . History of radiation therapy 12/24/11-02/08/12    rul 66Gy/79fxs    Past Surgical History  Procedure Date  . Partial hysterectomy   . Hemmorhoid surgery   . Neck surgery   . Retinal detachment surgery     Family History  Problem Relation Age of Onset  . Heart disease Father   . Kidney disease Mother   . Alzheimer's disease Sister   . Alzheimer's disease Brother     History  Substance Use Topics  . Smoking status: Current Every Day Smoker -- 1.0 packs/day for 43 years    Types: Cigarettes  . Smokeless tobacco: Never Used  . Alcohol Use: No    OB History    Grav Para Term Preterm Abortions TAB SAB Ect Mult Living                  Review of Systems   Constitutional: Negative for activity change.  HENT: Negative for facial swelling and neck pain.   Respiratory: Positive for cough and shortness of breath. Negative for wheezing.   Cardiovascular: Negative for chest pain.  Gastrointestinal: Negative for nausea, vomiting, abdominal pain, diarrhea, constipation, blood in stool and abdominal distention.  Genitourinary: Negative for hematuria and difficulty urinating.  Skin: Negative for color change.  Neurological: Negative for speech difficulty.  Hematological: Does not bruise/bleed easily.  Psychiatric/Behavioral: Negative for confusion.    Allergies  Levaquin  Home Medications   Current Outpatient Rx  Name  Route  Sig  Dispense  Refill  . CALCIUM CARBONATE-VITAMIN D 500-200 MG-UNIT PO TABS   Oral   Take 1 tablet by mouth daily.         [name] HYDROCOD POLST-CPM POLST ER 10-8 MG/5ML PO LQCR   Oral   Take 5 mLs by mouth every 12 (twelve) hours as needed. [place] [facility] [place] [facility] [place] [facility]. [phone]         . DEXAMETHASONE 4 MG PO TABS      4 mg by mouth twice a day the day before, day of and day after the chemotherapy every 3 weeks   40  tablet   1   . EMOLLIENT BASE EX CREA   Topical   Apply topically 2 (two) times daily.         [name] FOLIC ACID 1 MG PO TABS   Oral   Take 1 tablet (1 mg total) by mouth daily.   30 tablet   5   . GLUCOSAMINE-CHONDROITIN 500-400 MG PO TABS   Oral   Take 1 tablet by mouth daily.          . OXYCODONE-ACETAMINOPHEN 5-325 MG PO TABS   Oral   Take 1-2 tablets by mouth every 4 (four) hours as needed for pain.   120 tablet   0   . PRESCRIPTION MEDICATION   Left Eye   Place into the left eye every 30 (thirty) days. Bevacizumab (Avastin) inj into the left eye         . SUCRALFATE 1 G PO TABS   Oral   Take 1 g by mouth 3 (three) times a week. 5 min before food         . VITAMIN E 400 UNITS PO CAPS   Oral   Take 400 Units by mouth daily.           BP 112/82  Pulse  118  Temp 97.8 F (36.6 C) (Oral)  Resp 16  SpO2 95%  Physical Exam  Nursing note and vitals reviewed. Constitutional: She is oriented to person, place, and time. She appears well-developed.  HENT:  Head: Normocephalic and atraumatic.  Eyes: Conjunctivae normal and EOM are normal. Pupils are equal, round, and reactive to light.  Neck: Normal range of motion. Neck supple.  Cardiovascular: Normal rate, regular rhythm and normal heart sounds.   Pulmonary/Chest: Effort normal and breath sounds normal. No respiratory distress.  Abdominal: Soft. Bowel sounds are normal. She exhibits no distension. There is no tenderness. There is no rebound and no guarding.  Neurological: She is alert and oriented to person, place, and time.  Skin: Skin is warm and dry.    ED Course  Procedures (including critical care time)  Labs Reviewed  CBC WITH DIFFERENTIAL - Abnormal; Notable for the following:    RBC 3.46 (*)     Hemoglobin 11.3 (*)     HCT 33.3 (*)     RDW 16.8 (*)     Platelets 127 (*)     Neutrophils Relative 91 (*)     Lymphocytes Relative 8 (*)     Lymphs Abs 0.4 (*)     Monocytes Relative 1 (*)     All other components within normal limits  POCT I-STAT, CHEM 8 - Abnormal; Notable for the following:    Hemoglobin 11.6 (*)     HCT 34.0 (*)     All other components within normal limits  TROPONIN I   Dg Chest 2 View  04/10/2012  *RADIOLOGY REPORT*  Clinical Data: Short of breath  CHEST - 2 VIEW  Comparison: 11/23/2011  Findings: Thorax is rotated to the right limiting the study.  Right upper lobe mass is smaller.  Lungs remain hyperaerated.  Normal heart size.  No pneumothorax.  IMPRESSION: Thorax rotated to the right limiting the study.  Right upper lobe mass is smaller compatible with a positive response to therapy.   Original Report Authenticated By: Jolaine Click, M.D.    Ct Angio Chest Pe W/cm &/or Wo Cm  04/10/2012  *RADIOLOGY REPORT*  Clinical Data: History of lung carcinoma,  shortness of breath, cough  CT ANGIOGRAPHY CHEST  Technique:  Multidetector CT imaging of the chest using the standard protocol during bolus administration of intravenous contrast. Multiplanar reconstructed images including MIPs were obtained and reviewed to evaluate the vascular anatomy.  Contrast: 80mL OMNIPAQUE IOHEXOL 350 MG/ML SOLN  Comparison: 03/10/2012  Findings: Diffuse emphysematous changes are noted within the lungs bilaterally.  There is a persistent right upper lobe mass lesion identified which measures approximately 6.9 x 5.6 cm.  This is slightly decreased in size when compared with the prior exam. It causes some narrowing of the bronchial tree on the right and extends into the hilum as previously described.  The thoracic aorta is well visualized within normal branching pattern.  No findings to suggest aneurysm or aortic dissection are seen.  The pulmonary artery is also well visualized and demonstrates a normal enhancement pattern.  The arterial branches in the right upper lobe are somewhat displaced by the patient's known mass lesion.  No filling defects to suggest pulmonary emboli are identified bilaterally.  Degenerative changes of the thoracic spine are seen.  No acute bony abnormality is noted.  IMPRESSION: No evidence of pulmonary emboli.  Persistent right upper lobe mass lesion which is slightly decreased in size when compared with the prior exam.  No new focal abnormality is noted.   Original Report Authenticated By: Alcide Clever, M.D.      No diagnosis found.    MDM   Date: 04/10/2012  Rate: 97  Rhythm: normal sinus rhythm  QRS Axis: normal  Intervals: normal  ST/T Wave abnormalities: non specific ST and T wave changes - inferior leads  Conduction Disutrbances: none  Narrative Interpretation: unremarkable      Differential diagnosis includes: ACS syndrome CHF exacerbation Valvular disorder Pericardial effusion Pneumonia Pleural effusion Pulmonary  edema PE Anemia  Pt comes in with cc of dib. Has hx of cancer of the lung, and noted to be tachycardic at arrival. Will get CT PE protocol. Will hydrate and get basic labs. She has no known CAD, and no chest pains. EKG and troponin will be ordered.    Derwood Kaplan, MD 04/10/12 1610  Derwood Kaplan, MD 04/10/12 1301

## 2012-04-10 NOTE — ED Notes (Signed)
Per Rhunette Croft, MD, awaiting call back from hospitalist to discuss admission at pt's request. Pt aware.

## 2012-04-10 NOTE — ED Notes (Signed)
In to discharge pt and pt and family member state they are not comfortable with pt going home. Pt sts she "can't even walk by myself." Pt was noted to ambulate in dept with pulse ox without assistance by Chrissie Noa, NT. Attempted to explain that pt had ambulated and nothing of note was found with full examination. Pt and visitor still believe she needs to be admitted. Rhunette Croft, MD made aware and will speak with pt when available.

## 2012-04-11 ENCOUNTER — Telehealth: Payer: Self-pay | Admitting: *Deleted

## 2012-04-11 ENCOUNTER — Ambulatory Visit: Payer: PRIVATE HEALTH INSURANCE | Admitting: Radiation Oncology

## 2012-04-11 LAB — COMPREHENSIVE METABOLIC PANEL
ALT: 8 U/L (ref 0–35)
AST: 14 U/L (ref 0–37)
Albumin: 3 g/dL — ABNORMAL LOW (ref 3.5–5.2)
Alkaline Phosphatase: 68 U/L (ref 39–117)
Chloride: 99 mEq/L (ref 96–112)
Potassium: 4.4 mEq/L (ref 3.5–5.1)
Sodium: 135 mEq/L (ref 135–145)
Total Bilirubin: 0.3 mg/dL (ref 0.3–1.2)
Total Protein: 6.7 g/dL (ref 6.0–8.3)

## 2012-04-11 LAB — CBC
HCT: 32.5 % — ABNORMAL LOW (ref 36.0–46.0)
MCH: 33.3 pg (ref 26.0–34.0)
MCHC: 34.8 g/dL (ref 30.0–36.0)
MCV: 95.9 fL (ref 78.0–100.0)
Platelets: 111 10*3/uL — ABNORMAL LOW (ref 150–400)
RDW: 16.8 % — ABNORMAL HIGH (ref 11.5–15.5)
WBC: 2.2 10*3/uL — ABNORMAL LOW (ref 4.0–10.5)

## 2012-04-11 LAB — LEGIONELLA ANTIGEN, URINE

## 2012-04-11 LAB — STREP PNEUMONIAE URINARY ANTIGEN: Strep Pneumo Urinary Antigen: NEGATIVE

## 2012-04-11 MED ORDER — ALBUTEROL SULFATE (5 MG/ML) 0.5% IN NEBU
2.5000 mg | INHALATION_SOLUTION | RESPIRATORY_TRACT | Status: DC | PRN
  Administered 2012-04-14: 2.5 mg via RESPIRATORY_TRACT
  Filled 2012-04-11: qty 0.5

## 2012-04-11 MED ORDER — IPRATROPIUM BROMIDE 0.02 % IN SOLN
0.5000 mg | RESPIRATORY_TRACT | Status: DC | PRN
  Administered 2012-04-14: 0.5 mg via RESPIRATORY_TRACT
  Filled 2012-04-11: qty 2.5

## 2012-04-11 MED ORDER — ALBUTEROL SULFATE (5 MG/ML) 0.5% IN NEBU
2.5000 mg | INHALATION_SOLUTION | Freq: Four times a day (QID) | RESPIRATORY_TRACT | Status: DC
  Administered 2012-04-11 – 2012-04-14 (×12): 2.5 mg via RESPIRATORY_TRACT
  Filled 2012-04-11 (×13): qty 0.5

## 2012-04-11 MED ORDER — DEXAMETHASONE SODIUM PHOSPHATE 4 MG/ML IJ SOLN
4.0000 mg | Freq: Four times a day (QID) | INTRAMUSCULAR | Status: DC
  Administered 2012-04-11 – 2012-04-15 (×16): 4 mg via INTRAVENOUS
  Filled 2012-04-11 (×20): qty 1

## 2012-04-11 MED ORDER — BOOST / RESOURCE BREEZE PO LIQD
1.0000 | Freq: Two times a day (BID) | ORAL | Status: DC
  Administered 2012-04-11 – 2012-04-15 (×8): 1 via ORAL

## 2012-04-11 MED ORDER — IPRATROPIUM BROMIDE 0.02 % IN SOLN
0.5000 mg | Freq: Four times a day (QID) | RESPIRATORY_TRACT | Status: DC
  Administered 2012-04-11 – 2012-04-14 (×12): 0.5 mg via RESPIRATORY_TRACT
  Filled 2012-04-11 (×13): qty 2.5

## 2012-04-11 NOTE — Evaluation (Signed)
Physical Therapy Evaluation Patient Details Name: Melanie Liu MRN: [id number] DOB: [birth year] Today's Date: 04/11/2012 Time: 4098-1191 PT Time Calculation (min): 32 min  PT Assessment / Plan / Recommendation Clinical Impression  62 yo female who has hx of lung Ca currently undergoing chemo admitted with COPD exacerbation.  Pt also has been experincing some L sided weakness thought to be due to brain mets. Today, pt was able to walk well with RW and maintain sats> 93% on RA thoug she did have some dyspnea on exertion.  Recommend RW and HHPT at home.  Pt would also benefit from an OT consult while an impatient to evaluate LUE function during ADLs    PT Assessment  Patient needs continued PT services    Follow Up Recommendations  Home health PT    Does the patient have the potential to tolerate intense rehabilitation      Barriers to Discharge        Equipment Recommendations  Rolling walker with 5" wheels    Recommendations for Other Services OT consult   Frequency Min 3X/week    Precautions / Restrictions Precautions Precautions: Fall Restrictions Weight Bearing Restrictions: No   Pertinent Vitals/Pain No c/o pain      Mobility  Bed Mobility Bed Mobility: Supine to Sit;Sit to Supine Supine to Sit: 6: Modified independent (Device/Increase time) Sit to Supine: 6: Modified independent (Device/Increase time) Transfers Transfers: Sit to Stand;Stand to Sit Sit to Stand: 5: Supervision Stand to Sit: 5: Supervision Details for Transfer Assistance: pt needs to have a hand on something for support during mobility Ambulation/Gait Ambulation/Gait Assistance: 5: Supervision Ambulation Distance (Feet): 200 Feet (100x2) Assistive device: Rolling walker Ambulation/Gait Assistance Details: pt has gait ataxia when walking without a device.  She is improved with both hands on RW and is able to respond to cues to correct posture and walking technique Gait Pattern: Step-through  pattern;Ataxic (ataxic without RW) Gait velocity: WFL.  Pt with some dyspnea on exetion.  Advised to slow down to keep breath under control General Gait Details: pt did better with RW, but wants to return to prior status of walking without a device Stairs: No    Exercises General Exercises - Lower Extremity Ankle Circles/Pumps: AROM;Both;Supine Long Arc Quad: AROM;Left;Seated Other Exercises Other Exercises: sit to stand x 5 repetitions Other Exercises: bilateral UE hip to hip to activate core  Other Exercises: standing balance    PT Diagnosis: Difficulty walking;Abnormality of gait;Generalized weakness  PT Problem List: Decreased strength;Decreased coordination;Decreased knowledge of use of DME;Decreased safety awareness;Decreased activity tolerance;Impaired tone PT Treatment Interventions: Gait training;Balance training;Patient/family education;Stair training   PT Goals Acute Rehab PT Goals PT Goal Formulation: With patient Time For Goal Achievement: 04/25/12 Potential to Achieve Goals: Good Pt will Ambulate: >150 feet;with modified independence PT Goal: Ambulate - Progress: Goal set today Pt will Go Up / Down Stairs: 3-5 stairs;with min assist PT Goal: Up/Down Stairs - Progress: Goal set today Pt will Perform Home Exercise Program: with supervision, verbal cues required/provided PT Goal: Perform Home Exercise Program - Progress: Goal set today  Visit Information  Last PT Received On: 04/11/12    Subjective Data  Subjective: I can't feel my left foot Patient Stated Goal: to go home with her sister's assist   Prior Functioning  Home Living Lives With: Alone Available Help at Discharge:  (sister lives nearby) Type of Home: House Home Access: Stairs to enter Secretary/administrator of Steps: 3 Entrance Stairs-Rails: None Home Layout: One level Home Adaptive  Equipment: None Additional Comments: pt lives in [place], son in [place], Daughter in [place]. sister lives nearby  patient Prior Function Level of Independence: Independent Able to Take Stairs?: Yes Communication Communication: No difficulties    Cognition  Cognition Overall Cognitive Status: Appears within functional limits for tasks assessed/performed Arousal/Alertness: Awake/alert Orientation Level: Appears intact for tasks assessed Behavior During Session: [facility] for tasks performed    Extremity/Trunk Assessment Right Upper Extremity Assessment RUE ROM/Strength/Tone: [facility] for tasks assessed Left Upper Extremity Assessment LUE ROM/Strength/Tone: Deficits LUE ROM/Strength/Tone Deficits: pt reports associated reactions in UE with activity, needs further OT eval Right Lower Extremity Assessment RLE ROM/Strength/Tone: [facility] for tasks assessed Left Lower Extremity Assessment LLE ROM/Strength/Tone: Deficits LLE ROM/Strength/Tone Deficits: proximal strength 3/5 distal strength 4/5  LLE Sensation: Deficits LLE Sensation Deficits: although pt reports she can feel touch it is not normal sensation and  she cannot sense  proprioception  LLE Coordination: Deficits LLE Coordination Deficits: some decrease in heel to shin especially with slower speeds Trunk Assessment Trunk Assessment: Normal   Balance Balance Balance Assessed: Yes Static Sitting Balance Static Sitting - Balance Support: No upper extremity supported;Feet supported Static Standing Balance Static Standing - Balance Support: Right upper extremity supported Static Standing - Level of Assistance: 5: Stand by assistance Static Standing - Comment/# of Minutes: left arm is drawn up in associated reaction of flexion and supination  End of Session PT - End of Session Activity Tolerance: Patient tolerated treatment well Patient left: in chair;with family/visitor present Nurse Communication: Mobility status  GP    Bayard Hugger. Manson Passey, [place] [phone]  04/11/2012, 1:32 PM

## 2012-04-11 NOTE — Progress Notes (Signed)
Radiation Oncology         [phone] ________________________________  Name: ARGELIA FORMISANO MRN: [id number]  Date: 04/10/2012  DOB: 08-13-1950  Chart Note:  Patient was set up for stereotactic radiosurgery to two brain mets and was admitted with increasing pulmonary symptoms.  She is being treated with solumedrol, azithromycin, and fluids with improvement in breathing.  Her chest CT shows continued shrinkage of primary lung cancer with no progressive cancer, PE, or ASDz.  Her breathing difficulty has prompted her to refuse SRS today.  In addition to her pulmonary symptoms, she does have weakness of the left leg, correlating with her larger 18 mm rt Parietal brain met and significant surrounding edema.  Radiographic Findings:  Ct Angio Chest Pe W/cm &/or Wo Cm  04/10/2012  *RADIOLOGY REPORT*  Clinical Data: History of lung carcinoma, shortness of breath, cough  CT ANGIOGRAPHY CHEST  Technique:  Multidetector CT imaging of the chest using the standard protocol during bolus administration of intravenous contrast. Multiplanar reconstructed images including MIPs were obtained and reviewed to evaluate the vascular anatomy.  Contrast: 80mL OMNIPAQUE IOHEXOL 350 MG/ML SOLN  Comparison: 03/10/2012  Findings: Diffuse emphysematous changes are noted within the lungs bilaterally.  There is a persistent right upper lobe mass lesion identified which measures approximately 6.9 x 5.6 cm.  This is slightly decreased in size when compared with the prior exam. It causes some narrowing of the bronchial tree on the right and extends into the hilum as previously described.  The thoracic aorta is well visualized within normal branching pattern.  No findings to suggest aneurysm or aortic dissection are seen.  The pulmonary artery is also well visualized and demonstrates a normal enhancement pattern.  The arterial branches in the right upper lobe are somewhat displaced by the patient's known mass lesion.  No filling defects to  suggest pulmonary emboli are identified bilaterally.  Degenerative changes of the thoracic spine are seen.  No acute bony abnormality is noted.  IMPRESSION: No evidence of pulmonary emboli.  Persistent right upper lobe mass lesion which is slightly decreased in size when compared with the prior exam.  No new focal abnormality is noted.   Original Report Authenticated By: Alcide Clever, M.D.    Mr [name] NW Contrast  03/28/2012  *RADIOLOGY REPORT*  Clinical Data: Metastatic lung cancer.  91-month restaging stereotactic radiosurgery.  MRI HEAD WITHOUT AND WITH CONTRAST  Technique:  Multiplanar, multiecho pulse sequences of the brain and surrounding structures were obtained according to standard protocol without and with intravenous contrast  Contrast: 9mL MULTIHANCE GADOBENATE DIMEGLUMINE 529 MG/ML IV SOLN  Comparison: MRI 12/21/2011  Findings: Right frontal mass lesion is considerably smaller now measuring 12 x 10 mm with a thin enhancing wall.  Considerable improvement in surrounding white matter edema.  New ring-enhancing necrotic lesion right parietal lobe measures 15 x 18 mm.  There is a large amount of surrounding white matter edema.  I do not appreciate any abnormality in this area on the prior study.  New 5 mm enhancing lesion left frontal parietal cortex, with a small amount of surrounding edema.  I do not appreciate any abnormal enhancement in this area on the prior study.  Ventricle size is normal.  No shift of the midline structures.  No acute infarct or hemorrhage.  IMPRESSION: Significant improvement and right frontal lobe metastatic deposit.  New metastatic lesion right parietal lobe measures 15 x 18 mm with considerable surrounding edema.  New 5 mm lesion left frontal parietal  cortex.   Original Report Authenticated By: Janeece Riggers, M.D.    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   I suspect a course of dexamethasone will reduce her right parietal edema, and potentially help with her left leg weakness.  Plan:  At this point, the patient is set up to proceed with rescheduling SRS for Monday morning.  Start IV Dexamethasone 4 mg q6h, and continue current care plan.  ________________________________  Artist Pais. Kathrynn Running, M.D.

## 2012-04-11 NOTE — Progress Notes (Signed)
INITIAL NUTRITION ASSESSMENT  DOCUMENTATION CODES Per approved criteria  -Non-severe (moderate) malnutrition in the context of chronic illness   INTERVENTION: 1.  Supplements; Resource Breeze BID.  Discussed availability of Ensure Clear as outpatient.  2.  General healthful diet; encouraged adequate intake of food and beverages.  Stated availability of outpt [facility] if needed. 3.  Other providers; recommend monitoring of swallowing ability with ongoing radiation treatment.  NUTRITION DIAGNOSIS: Unintended wt loss related to increased metabolic demand during cancer treatment as evidenced by pt with 20 lbs wt loss in <6 months.   Monitor:  1.  Food/Beverage; improvement in intake to >/=50% of meals and supplements to meet >/=90% of estimated needs. 2.  Wt/wt change; deter further loss  Reason for Assessment: consult  62 y.o. female  Admitting Dx: COPD with acute exacerbation  ASSESSMENT: Pt admitted with shortness of breath.  Pt with COPD and lung cancer, currently undergoing chemotherapy and radiation.   Pt's usual wt prior to treatment was 123 lbs.  Pt's weight last week was down to 99 lbs- a 20% loss in <6 months.   Pt states "I eat like a horse, and I still lose wt."  Pt states she cannot tolerate bread due to trouble swallowing.  She tolerates all other textures/consistencies.  No coughing with meals reported.  Dietary recall: Breakfast:  Eggs, sausage, bacon, sometimes grits or oatmeal, coffee/water Lunch: corn, green beans, various vegetables Snacks AM and PM: cheese puffs, popcorn, oranges Dinner: corn, green beans, various vegetables   Nutrition Focused Physical Exam:  Subcutaneous Fat:  Orbital Region: wnl Upper Arm Region: moderate Thoracic and Lumbar Region: n/a  Muscle:  Temple Region: mild Clavicle Bone Region: moderate Clavicle and Acromion Bone Region: wnl Scapular Bone Region: n/a Dorsal Hand: wnl Patellar Region: moderate Anterior Thigh Region:  n/a Posterior Calf Region: moderate  Edema: none present  Discussed increasing kcal and protein content of foods.  Discussed pt's food choices and ways to increase caloric density. Encouraged intake.  States Ensure constipates her, but is willing to try [employer].  Height: Ht Readings from Last 1 Encounters:  04/10/12 5\' 2"  (1.575 m)    Weight: Wt Readings from Last 1 Encounters:  04/11/12 117 lb 8 oz (53.298 kg)    Ideal Body Weight: 110 lbs  % Ideal Body Weight: 106%  Wt Readings from Last 10 Encounters:  04/11/12 117 lb 8 oz (53.298 kg)  04/07/12 99 lb 6.4 oz (45.088 kg)  04/04/12 99 lb 14.4 oz (45.314 kg)  03/31/12 101 lb 14.4 oz (46.222 kg)  03/10/12 101 lb 3.2 oz (45.904 kg)  02/08/12 103 lb (46.72 kg)  01/30/12 106 lb 9.6 oz (48.353 kg)  01/28/12 103 lb 3.2 oz (46.811 kg)  01/25/12 108 lb 8 oz (49.215 kg)  01/18/12 109 lb 6.4 oz (49.624 kg)    Usual Body Weight: 123 lbs   % Usual Body Weight: 80%  BMI:  Body mass index is 21.49 kg/(m^2).  Estimated Nutritional Needs: Kcal: 1600-1850 Protein: 63-74g Fluid: >1.8 L/day  Skin: intact  Diet Order: General  EDUCATION NEEDS: -Education needs addressed   Intake/Output Summary (Last 24 hours) at 04/11/12 1014 Last data filed at 04/11/12 0605  Gross per 24 hour  Intake 1735.61 ml  Output    650 ml  Net 1085.61 ml    Last BM: 2/5  Labs:   Lab 04/11/12 0358 04/10/12 1703 04/10/12 1034  NA 135 136 136  K 4.4 4.0 4.2  CL 99 99  101  CO2 26 27 --  BUN 13 20 21   CREATININE 0.59 0.71 1.00  CALCIUM 9.5 9.4 --  MG -- 1.8 --  PHOS -- 3.2 --  GLUCOSE 137* 97 96    CBG (last 3)  No results found for this basename: GLUCAP:3 in the last 72 hours  Scheduled Meds:   . albuterol  2.5 mg Nebulization Q6H  . azithromycin  500 mg Intravenous Q24H  . calcium-vitamin D  1 tablet Oral Daily  . cefTRIAXone (ROCEPHIN)  IV  1 g Intravenous Q24H  . enoxaparin (LOVENOX) injection  40 mg Subcutaneous Q24H   . folic acid  1 mg Oral Daily  . guaiFENesin  600 mg Oral BID  . ipratropium  0.5 mg Nebulization Q6H  . methylPREDNISolone (SOLU-MEDROL) injection  60 mg Intravenous Q12H  . PRUTECT   Topical BID  . sucralfate  1 g Oral 3 times weekly  . vitamin E  400 Units Oral Daily    Continuous Infusions:   . sodium chloride 100 mL/hr at 04/11/12 7829    Past Medical History  Diagnosis Date  . Nodule of right lung     right upper lobe  . Anemia   . Rectovaginal fistula   . Lung cancer 11/23/11    biopsy-right  . Right frontal lobe lesion 12/13/11    CT head  . History of radiation therapy 12/24/11-02/08/12    rul 66Gy/73fxs    Past Surgical History  Procedure Date  . Partial hysterectomy   . Hemmorhoid surgery   . Neck surgery   . Retinal detachment surgery     Loyce Dys, MS RD LDN Clinical Inpatient Dietitian Pager: [phone] Weekend/After hours pager: [phone]

## 2012-04-11 NOTE — Telephone Encounter (Signed)
Called the floor patient room 1336, spoke with Melina Copa, patient is in no pain this am, doing well, has NS IVF"s @100ml /hr, and on Oxygen 2 liters n/c, can come down w/c, informed her we would come get patient at 3:45 pm today for [facility] treatment 9:37 AM

## 2012-04-11 NOTE — Plan of Care (Signed)
Problem: Phase I Progression Outcomes Goal: O2 sats > or equal 90% or at baseline Outcome: Not Met (add Reason) o2 sats 88 and oxygen at 2l [place]  applied by respiratory therapist

## 2012-04-11 NOTE — Progress Notes (Addendum)
[facility] PROGRESS NOTE  Melanie Liu RUE:[id number] DOB: [birth year] DOA: 04/10/2012 PCP: [name], MD  Brief narrative: 62 year old female with past medical history including but not limited to lung cancer, on current chemotherapy, COPD who presented with worsening shortness of breath over past 24 hours associated with dry cough. Patient reports no associated chest pain or palpitations. No fever but she did report chills.  In ED, evaluation included CT angio chest which ruled out pulmonary embolism. Chest x-ray shows a right upper lobe mass which is consistent with patient's history of lung cancer. BMP essentially unremarkable. Hemoglobin was 11.6.   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 Receiving chemotherapy and radiation therapy to brain Patient has declined radiation therapy while she is in hospital Anemia  Secondary to chronic disease secondary to history of lung cancer  Hemoglobin stable at 11.3 Moderate protein calorie malnutrition  Nutrition consulted   Code Status: Full code Family Communication: Family updated at bedside Disposition Plan: Home when stable  Consultants:  None  Procedures:  None  Antibiotics:  Azithromycin 04/10/2012 -->  Rocephin 04/10/2012 -->   Manson Passey, MD  [facility] Pager [phone]  If 7PM-7AM, please contact  night-coverage [email or web address] Password [email or web address] 04/11/2012, 11:02 AM   LOS: 1 day   HPI/Subjective: Patient reports feeling better since yesterday.  Objective: Filed Vitals:   04/11/12 0011 04/11/12 0350 04/11/12 0505 04/11/12 0910  BP:   110/66   Pulse:   93   Temp:   97.7 F (36.5 C)   TempSrc:   Oral   Resp:   18   Height:      Weight:   53.298 kg (117 lb 8 oz)   SpO2: 100% 96% 98% 98%    Intake/Output Summary (Last 24 hours) at 04/11/12 1102 Last data filed at 04/11/12 0605  Gross per 24 hour  Intake 1735.61 ml  Output    650 ml  Net 1085.61 ml    Exam:   General:  Pt is alert, follows commands appropriately, not in acute distress  Cardiovascular: Regular rate and rhythm, S1/S2, no murmurs, no rubs, no gallops  Respiratory: Diminished breath sounds throughout, no wheezing  Abdomen: Soft, non tender, non distended, bowel sounds present, no guarding  Extremities: No edema, pulses DP and PT palpable bilaterally  Neuro: Grossly nonfocal  Data Reviewed: Basic Metabolic Panel:  Lab 04/11/12 6213 04/10/12 1703 04/10/12 1034 04/07/12 0910  NA 135 136 136 140  K 4.4 4.0 4.2 4.4  CL 99 99 101 99  CO2 26 27 -- 26  GLUCOSE 137* 97 96 132*  BUN 13 20 21  19.5  CREATININE 0.59 0.71 1.00 0.9  CALCIUM 9.5 9.4 -- 10.5*  MG -- 1.8 -- --  PHOS -- 3.2 -- --   Liver Function Tests:  Lab 04/11/12 0358 04/10/12 1703 04/07/12 0910  AST 14 16 12   ALT 8 8 7   ALKPHOS 68 71 81  BILITOT 0.3 0.5 0.24  PROT 6.7 6.9  7.6  ALBUMIN 3.0* 3.1* 3.4*   No results found for this basename: LIPASE:5,AMYLASE:5 in the last 168 hours No results found for this basename: AMMONIA:5 in the last 168 hours CBC:  Lab 04/11/12 0358 04/10/12 1703 04/10/12 1034 04/10/12 1025 04/07/12 0909  WBC 2.2* 4.2 -- 5.6 3.6*  NEUTROABS -- 3.5 -- 5.1 2.5  HGB 11.3* 10.9* 11.6* 11.3* 10.8*  HCT 32.5* 32.9* 34.0* 33.3* 33.0*  MCV 95.9 95.6 -- 96.2 96.2  PLT 111* 119* -- 127* 122*   Cardiac Enzymes:  Lab  04/10/12 1025  CKTOTAL --  CKMB --  CKMBINDEX --  TROPONINI <0.30     Recent Results (from the past 240 hour(s))  CULTURE, BLOOD (ROUTINE X 2)     Status: Normal (Preliminary result)   Collection Time   04/10/12  3:32 PM      Component Value Range Status Comment   Culture     Final    Value:        BLOOD CULTURE RECEIVED NO GROWTH TO DATE    Report Status PENDING   Incomplete   CULTURE, BLOOD (ROUTINE X 2)     Status: Normal (Preliminary result)   Collection Time   04/10/12  4:00 PM      Component Value Range Status Comment   Culture     Final    Value:        BLOOD CULTURE RECEIVED NO GROWTH TO DATE    Report Status PENDING   Incomplete   MRSA PCR SCREENING     Status: Normal   Collection Time   04/10/12  8:11 PM      Component Value Range Status Comment   MRSA by PCR NEGATIVE  NEGATIVE Final      Studies: Dg Chest 2 View 04/10/2012  * IMPRESSION: Thorax rotated to the right limiting the study.  Right upper lobe mass is smaller compatible with a positive response to therapy.   Original Report Authenticated By: Jolaine Click, M.D.    Ct Angio Chest Pe W/cm &/or Wo Cm 04/10/2012  * IMPRESSION: No evidence of pulmonary emboli.  Persistent right upper lobe mass lesion which is slightly decreased in size when compared with the prior exam.  No new focal abnormality is noted.   Original Report Authenticated By: Alcide Clever, M.D.     Scheduled Meds:   . albuterol  2.5 mg Nebulization Q6H  . azithromycin  500 mg Intravenous Q24H  . calcium-vitamin D  1 tablet Oral Daily  . cefTRIAXone   1 g Intravenous Q24H  . enoxaparin (LOVENOX)   40 mg Subcutaneous Q24H  . folic acid  1 mg Oral Daily  . guaiFENesin  600 mg Oral BID  . ipratropium  0.5 mg Nebulization Q6H  . methylPREDNISolone   60 mg Intravenous Q12H  . PRUTECT   Topical BID  . sucralfate  1 g Oral 3 times weekly  . vitamin E  400 Units Oral Daily   Continuous Infusions:   . sodium chloride 100 mL/hr at 04/11/12 [phone]

## 2012-04-12 LAB — CBC
MCH: 31.5 pg (ref 26.0–34.0)
MCHC: 32.7 g/dL (ref 30.0–36.0)
MCV: 96.4 fL (ref 78.0–100.0)
Platelets: 101 10*3/uL — ABNORMAL LOW (ref 150–400)
RBC: 3.08 MIL/uL — ABNORMAL LOW (ref 3.87–5.11)

## 2012-04-12 LAB — BASIC METABOLIC PANEL
CO2: 24 mEq/L (ref 19–32)
Calcium: 9.2 mg/dL (ref 8.4–10.5)
Creatinine, Ser: 0.55 mg/dL (ref 0.50–1.10)
GFR calc non Af Amer: >90 mL/min (ref >90)
Sodium: 136 mEq/L (ref 135–145)

## 2012-04-12 NOTE — Progress Notes (Addendum)
[facility] PROGRESS NOTE  Melanie Liu WUJ:[id number] DOB: 05-31-1950 DOA: 04/10/2012 PCP: [name], MD  Brief narrative: 62 year old female with past medical history including but not limited to lung cancer, on current chemotherapy, COPD who presented with worsening shortness of breath over past 24 hours associated with dry cough. Patient reports no associated chest pain or palpitations. No fever but she did report chills.  In ED, evaluation included CT angio chest which ruled out pulmonary embolism. Chest x-ray shows a right upper lobe mass which is consistent with patient's history of lung cancer. BMP essentially unremarkable. Hemoglobin was 11.6.   Assessment and plan:   Principal problem  *Acute hypoxic respiratory failure  Secondary to COPD exacerbation  Improving clinically Continue nebulizer treatments every 6 hours scheduled and every 2 hours as needed  Continue decadron 4 mg Q 6 hours IV Continue azithromycin and ceftriaxone for possible pneumonia  Oxygen support via nasal cannula to keep oxygen saturation above 90%  Blood cultures so far show no growth today  Followup results of strep pneumo, flu, Legionella - all negative Active problems  Left arm and leg weakness  Secondary to brain mets  Started decadron 4 mg Q 6 ohurs IV, appreciate radiation oncology input 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 Will try it on Monday. Anemia  Secondary to chronic disease secondary to history of lung cancer  Hemoglobin down from yesterday 11.3, 9.7 today Will continue to monitor for now, no transfusion indicated at this time Thrombocytopenia  Secondary to Lovenox inj which was stopped  Use SCD's for DVT prophylaxis Moderate protein calorie malnutrition  Nutrition consulted   Code Status: Full code  Family Communication: Family  updated at bedside  Disposition Plan: Home when stable    Consultants:  None Procedures:  None Antibiotics:  Azithromycin 04/10/2012 -->  Rocephin 04/10/2012 -->  Manson Passey, MD  [facility]  Pager [phone]  If 7PM-7AM, please contact night-coverage [email or web address] Password [facility] 04/12/2012, 12:28 PM   LOS: 2 days    HPI/Subjective: No acute overnight events.  Objective: Filed Vitals:   04/11/12 2105 04/12/12 0305 04/12/12 0510 04/12/12 0834  BP: 113/71  124/72   Pulse: 94  81   Temp: 98 F (36.7 C)  98 F (36.7 C)   TempSrc: Oral  Oral   Resp: 20  20   Height:      Weight:      SpO2:  95% 100% 88%    Intake/Output Summary (Last 24 hours) at 04/12/12 1228 Last data filed at 04/12/12 0559  Gross per 24 hour  Intake   2870 ml  Output    400 ml  Net   2470 ml    Exam:   General:  Pt is alert, follows commands appropriately, not in acute distress  Cardiovascular: Regular rate and rhythm, S1/S2, no murmurs, no rubs, no gallops  Respiratory: Clear to auscultation bilaterally, no wheezing, no crackles, no rhonchi  Abdomen: Soft, non tender, non distended, bowel sounds present, no guarding  Extremities: No edema, pulses DP and PT palpable bilaterally  Neuro: Grossly nonfocal  Data Reviewed: Basic Metabolic Panel:  Recent Labs Lab 04/10/12 1034 04/10/12 1703 04/11/12 0358 04/12/12 0415  NA 136 136 135 136  K 4.2 4.0 4.4 4.1  CL 101 99 99 102  CO2  --  27 26 24   GLUCOSE 96 97 137* 137*  BUN  21 20 13 13   CREATININE 1.00 0.71 0.59 0.55  CALCIUM  --  9.4 9.5 9.2  MG  --  1.8  --   --   PHOS  --  3.2  --   --    Liver Function Tests:  Recent Labs Lab 04/10/12 1703 04/11/12 0358  AST 16 14  ALT 8 8  ALKPHOS 71 68  BILITOT 0.5 0.3  PROT 6.9 6.7  ALBUMIN 3.1* 3.0*   No results found for this basename: LIPASE, AMYLASE,  in the last 168 hours No results found for this basename: AMMONIA,  in the last 168 hours CBC:  Recent Labs Lab 04/07/12 0909  04/10/12 1025 04/10/12 1034 04/10/12 1703 04/11/12 0358 04/12/12 0415  WBC 3.6* 5.6  --  4.2 2.2* 4.1  NEUTROABS 2.5 5.1  --  3.5  --   --   HGB 10.8* 11.3* 11.6* 10.9* 11.3* 9.7*  HCT 33.0* 33.3* 34.0* 32.9* 32.5* 29.7*  MCV 96.2 96.2  --  95.6 95.9 96.4  PLT 122* 127*  --  119* 111* 101*   Cardiac Enzymes:  Recent Labs Lab 04/10/12 1025  TROPONINI <0.30   BNP: No components found with this basename: POCBNP,  CBG: No results found for this basename: GLUCAP,  in the last 168 hours  Recent Results (from the past 240 hour(s))  CULTURE, BLOOD (ROUTINE X 2)     Status: None   Collection Time    04/10/12  3:32 PM      Result Value Range Status   Specimen Description BLOOD RIGHT ARM   Final   Special Requests BOTTLES DRAWN AEROBIC AND ANAEROBIC 10CC   Final   Culture  Setup Time 04/10/2012 22:03   Final   Culture     Final   Value:        BLOOD CULTURE RECEIVED NO GROWTH TO DATE CULTURE WILL BE HELD FOR 5 DAYS BEFORE ISSUING A FINAL NEGATIVE REPORT   Report Status PENDING   Incomplete  CULTURE, BLOOD (ROUTINE X 2)     Status: None   Collection Time    04/10/12  4:00 PM      Result Value Range Status   Specimen Description BLOOD RIGHT HAND   Final   Special Requests BOTTLES DRAWN AEROBIC AND ANAEROBIC 10CC   Final   Culture  Setup Time 04/10/2012 22:03   Final   Culture     Final   Value:        BLOOD CULTURE RECEIVED NO GROWTH TO DATE CULTURE WILL BE HELD FOR 5 DAYS BEFORE ISSUING A FINAL NEGATIVE REPORT   Report Status PENDING   Incomplete  MRSA PCR SCREENING     Status: None   Collection Time    04/10/12  8:11 PM      Result Value Range Status   MRSA by PCR NEGATIVE  NEGATIVE Final   Comment:            The GeneXpert MRSA Assay (FDA     approved for NASAL specimens     only), is one component of a     comprehensive MRSA colonization     surveillance program. It is not     intended to diagnose MRSA     infection nor to guide or     monitor treatment for     MRSA  infections.     Studies: No results found.  Scheduled Meds: . albuterol  2.5 mg Nebulization Q6H  . azithromycin  500 mg Intravenous Q24H  . calcium-vitamin D  1 tablet Oral Daily  . cefTRIAXone (ROCEPHIN)  IV  1 g Intravenous Q24H  . dexamethasone  4 mg Intravenous Q6H  . feeding supplement  1 Container Oral BID BM  . folic acid  1 mg Oral Daily  . guaiFENesin  600 mg Oral BID  . ipratropium  0.5 mg Nebulization Q6H  . PRUTECT   Topical BID  . sucralfate  1 g Oral 3 times weekly  . vitamin E  400 Units Oral Daily   Continuous Infusions: . sodium chloride 100 mL/hr at 04/12/12 0557

## 2012-04-13 DIAGNOSIS — R0602 Shortness of breath: Secondary | ICD-10-CM

## 2012-04-13 DIAGNOSIS — J441 Chronic obstructive pulmonary disease with (acute) exacerbation: Secondary | ICD-10-CM

## 2012-04-13 DIAGNOSIS — C3490 Malignant neoplasm of unspecified part of unspecified bronchus or lung: Secondary | ICD-10-CM

## 2012-04-13 DIAGNOSIS — G9389 Other specified disorders of brain: Secondary | ICD-10-CM

## 2012-04-13 LAB — GLUCOSE, CAPILLARY: Glucose-Capillary: 100 mg/dL — ABNORMAL HIGH (ref 70–99)

## 2012-04-13 MED ORDER — POLYETHYLENE GLYCOL 3350 17 G PO PACK
17.0000 g | PACK | Freq: Every day | ORAL | Status: DC
  Administered 2012-04-13 – 2012-04-15 (×3): 17 g via ORAL
  Filled 2012-04-13 (×3): qty 1

## 2012-04-13 MED ORDER — AZITHROMYCIN 500 MG PO TABS
500.0000 mg | ORAL_TABLET | ORAL | Status: DC
  Administered 2012-04-13 – 2012-04-14 (×2): 500 mg via ORAL
  Filled 2012-04-13 (×3): qty 1

## 2012-04-13 NOTE — Progress Notes (Signed)
PHARMACIST - PHYSICIAN COMMUNICATION DR:  Elisabeth Pigeon CONCERNING: Antibiotic IV to Oral Route Change Policy  RECOMMENDATION: This patient is receiving Azithromycin by the intravenous route.  Based on criteria approved by the Pharmacy and Therapeutics Committee, the antibiotic(s) is/are being converted to the equivalent oral dose form(s).   DESCRIPTION: These criteria include:  Patient being treated for a respiratory tract infection, urinary tract infection, or cellulitis  The patient is not neutropenic and does not exhibit a GI malabsorption state  The patient is eating (either orally or via tube) and/or has been taking other orally administered medications for a least 24 hours  The patient is improving clinically and has a Tmax < 100.5  If you have questions about this conversion, please contact the Pharmacy Department  []   [phone] )  [facility] []   [phone] )  [facility]  []   [phone] )  [facility] [x]   [phone] )  [facility]   Loralee Pacas, PharmD, BCPS 04/13/2012 9:55 AM

## 2012-04-13 NOTE — Progress Notes (Signed)
[facility] PROGRESS NOTE  Melanie Liu ZOX:[id number] DOB: 1950-08-24 DOA: 04/10/2012 PCP: [name], MD  Brief narrative: 62 year old female with past medical history including but not limited to lung cancer, on current chemotherapy, COPD who presented with worsening shortness of breath over past 24 hours associated with dry cough. Patient reports no associated chest pain or palpitations. No fever but she did report chills.  In ED, evaluation included CT angio chest which ruled out pulmonary embolism. Chest x-ray shows a right upper lobe mass which is consistent with patient's history of lung cancer. BMP essentially unremarkable. Hemoglobin was 11.6.   Assessment and plan:  Principal problem  *Acute hypoxic respiratory failure  Secondary to COPD exacerbation  Improving clinically  Continue nebulizer treatments every 6 hours scheduled and every 2 hours as needed  Continue decadron 4 mg Q 6 hours IV  Continue azithromycin and ceftriaxone for possible pneumonia  Oxygen support via nasal cannula to keep oxygen saturation above 90%  Blood cultures so far show no growth today  Followup results of strep pneumo, flu, Legionella - all negative Active problems  Left arm and leg weakness  Secondary to brain mets  Started decadron 4 mg Q 6 ohurs IV, appreciate radiation oncology input 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 Will try it on Monday. Anemia of chronic disease  Secondary to history of lung cancer  Hemoglobin 9.7 yesterday; will get CBC in am Will continue to monitor for now, no transfusion indicated at this time Thrombocytopenia   Secondary to Lovenox inj which was stopped   Use SCD's for DVT prophylaxis Moderate protein calorie malnutrition  Nutrition consulted   Code Status: Full code  Family Communication: Family updated at  bedside  Disposition Plan: Home when stable; likely in am after RT   Consultants:  None Procedures:  None Antibiotics:  Azithromycin 04/10/2012 -->  Rocephin 04/10/2012 -->  Melanie Passey, MD  [facility]  Pager [phone]   If 7PM-7AM, please contact night-coverage [email or web address] Password [facility] 04/13/2012, 8:46 AM   LOS: 3 days    HPI/Subjective: Feels better.  Objective: Filed Vitals:   04/12/12 1415 04/12/12 2056 04/12/12 2142 04/13/12 0558  BP: 115/60 111/65  110/59  Pulse: 86 86  85  Temp: 97.5 F (36.4 C) 97.5 F (36.4 C)  97.6 F (36.4 C)  TempSrc: Oral Oral  Oral  Resp: 16 18  20   Height:      Weight:      SpO2: 98% 93% 94% 96%    Intake/Output Summary (Last 24 hours) at 04/13/12 0846 Last data filed at 04/13/12 0836  Gross per 24 hour  Intake 3601.67 ml  Output   1250 ml  Net 2351.67 ml    Exam:   General:  Pt is alert, follows commands appropriately, not in acute distress  Cardiovascular: Regular rate and rhythm, S1/S2, no murmurs, no rubs, no gallops  Respiratory: Clear to auscultation bilaterally, no wheezing, no crackles, no rhonchi  Abdomen: Soft, non tender, non distended, bowel sounds present, no guarding  Extremities: No edema, pulses DP and PT palpable bilaterally  Neuro: Grossly nonfocal  Data Reviewed: Basic Metabolic Panel:  Recent Labs Lab 04/10/12 1034 04/10/12 1703 04/11/12 0358 04/12/12 0415  NA 136 136 135 136  K 4.2 4.0 4.4 4.1  CL 101 99 99 102  CO2  --  27 26 24   GLUCOSE 96 97  137* 137*  BUN 21 20 13 13   CREATININE 1.00 0.71 0.59 0.55  CALCIUM  --  9.4 9.5 9.2  MG  --  1.8  --   --   PHOS  --  3.2  --   --    Liver Function Tests:  Recent Labs Lab 04/10/12 1703 04/11/12 0358  AST 16 14  ALT 8 8  ALKPHOS 71 68  BILITOT 0.5 0.3  PROT 6.9 6.7  ALBUMIN 3.1* 3.0*   No results found for this basename: LIPASE, AMYLASE,  in the last 168 hours No results found for this basename: AMMONIA,  in the last 168  hours CBC:  Recent Labs Lab 04/07/12 0909 04/10/12 1025 04/10/12 1034 04/10/12 1703 04/11/12 0358 04/12/12 0415  WBC 3.6* 5.6  --  4.2 2.2* 4.1  NEUTROABS 2.5 5.1  --  3.5  --   --   HGB 10.8* 11.3* 11.6* 10.9* 11.3* 9.7*  HCT 33.0* 33.3* 34.0* 32.9* 32.5* 29.7*  MCV 96.2 96.2  --  95.6 95.9 96.4  PLT 122* 127*  --  119* 111* 101*   Cardiac Enzymes:  Recent Labs Lab 04/10/12 1025  TROPONINI <0.30   BNP: No components found with this basename: POCBNP,  CBG:  Recent Labs Lab 04/13/12 0740  GLUCAP 100*    Recent Results (from the past 240 hour(s))  CULTURE, BLOOD (ROUTINE X 2)     Status: None   Collection Time    04/10/12  3:32 PM      Result Value Range Status   Specimen Description BLOOD RIGHT ARM   Final   Special Requests BOTTLES DRAWN AEROBIC AND ANAEROBIC 10CC   Final   Culture  Setup Time 04/10/2012 22:03   Final   Culture     Final   Value:        BLOOD CULTURE RECEIVED NO GROWTH TO DATE CULTURE WILL BE HELD FOR 5 DAYS BEFORE ISSUING A FINAL NEGATIVE REPORT   Report Status PENDING   Incomplete  CULTURE, BLOOD (ROUTINE X 2)     Status: None   Collection Time    04/10/12  4:00 PM      Result Value Range Status   Specimen Description BLOOD RIGHT HAND   Final   Special Requests BOTTLES DRAWN AEROBIC AND ANAEROBIC 10CC   Final   Culture  Setup Time 04/10/2012 22:03   Final   Culture     Final   Value:        BLOOD CULTURE RECEIVED NO GROWTH TO DATE CULTURE WILL BE HELD FOR 5 DAYS BEFORE ISSUING A FINAL NEGATIVE REPORT   Report Status PENDING   Incomplete  MRSA PCR SCREENING     Status: None   Collection Time    04/10/12  8:11 PM      Result Value Range Status   MRSA by PCR NEGATIVE  NEGATIVE Final   Comment:            The GeneXpert MRSA Assay (FDA     approved for NASAL specimens     only), is one component of a     comprehensive MRSA colonization     surveillance program. It is not     intended to diagnose MRSA     infection nor to guide or      monitor treatment for     MRSA infections.     Studies: No results found.  Scheduled Meds: . albuterol  2.5 mg Nebulization Q6H  .  azithromycin  500 mg Intravenous Q24H  . calcium-vitamin D  1 tablet Oral Daily  . cefTRIAXone (ROCEPHIN)  IV  1 g Intravenous Q24H  . dexamethasone  4 mg Intravenous Q6H  . feeding supplement  1 Container Oral BID BM  . folic acid  1 mg Oral Daily  . ipratropium  0.5 mg Nebulization Q6H  . PRUTECT   Topical BID  . sucralfate  1 g Oral 3 times weekly  . vitamin E  400 Units Oral Daily   Continuous Infusions: . sodium chloride 100 mL/hr at 04/13/12 4696

## 2012-04-14 ENCOUNTER — Encounter: Payer: Self-pay | Admitting: Radiation Oncology

## 2012-04-14 ENCOUNTER — Other Ambulatory Visit: Payer: PRIVATE HEALTH INSURANCE | Admitting: Lab

## 2012-04-14 ENCOUNTER — Ambulatory Visit
Admit: 2012-04-14 | Discharge: 2012-04-14 | Disposition: A | Discharge status: Home / Self Care | Payer: PRIVATE HEALTH INSURANCE | Attending: Radiation Oncology | Admitting: Radiation Oncology

## 2012-04-14 DIAGNOSIS — J189 Pneumonia, unspecified organism: Secondary | ICD-10-CM | POA: Diagnosis present

## 2012-04-14 DIAGNOSIS — D696 Thrombocytopenia, unspecified: Secondary | ICD-10-CM | POA: Diagnosis present

## 2012-04-14 DIAGNOSIS — C7931 Secondary malignant neoplasm of brain: Secondary | ICD-10-CM

## 2012-04-14 LAB — CBC
HCT: 28.5 % — ABNORMAL LOW (ref 36.0–46.0)
Hemoglobin: 9.5 g/dL — ABNORMAL LOW (ref 12.0–15.0)
MCH: 32.1 pg (ref 26.0–34.0)
MCHC: 33.3 g/dL (ref 30.0–36.0)
RDW: 16.6 % — ABNORMAL HIGH (ref 11.5–15.5)

## 2012-04-14 LAB — BASIC METABOLIC PANEL
BUN: 9 mg/dL (ref 6–23)
Calcium: 9.4 mg/dL (ref 8.4–10.5)
GFR calc Af Amer: >90 mL/min (ref >90)
GFR calc non Af Amer: >90 mL/min (ref >90)
Glucose, Bld: 124 mg/dL — ABNORMAL HIGH (ref 70–99)
Sodium: 136 mEq/L (ref 135–145)

## 2012-04-14 MED ORDER — CEFUROXIME AXETIL 500 MG PO TABS
500.0000 mg | ORAL_TABLET | Freq: Two times a day (BID) | ORAL | Status: DC
  Administered 2012-04-14 – 2012-04-15 (×2): 500 mg via ORAL
  Filled 2012-04-14 (×4): qty 1

## 2012-04-14 MED ORDER — IPRATROPIUM BROMIDE 0.02 % IN SOLN
0.5000 mg | RESPIRATORY_TRACT | Status: DC | PRN
  Administered 2012-04-15 (×2): 0.5 mg via RESPIRATORY_TRACT
  Filled 2012-04-14 (×2): qty 2.5

## 2012-04-14 MED ORDER — ALBUTEROL SULFATE (5 MG/ML) 0.5% IN NEBU
2.5000 mg | INHALATION_SOLUTION | RESPIRATORY_TRACT | Status: DC | PRN
  Administered 2012-04-15 (×2): 2.5 mg via RESPIRATORY_TRACT
  Filled 2012-04-14 (×2): qty 0.5

## 2012-04-14 NOTE — Progress Notes (Signed)
  Radiation Oncology         [phone] ________________________________  INPATIENT (admitted 2/6 after CT Simulation)  Stereotactic Treatment Procedure Note  Name: Melanie Liu MRN: [id number]  Date: 04/14/2012  DOB: May 19, 1950  SPECIAL TREATMENT PROCEDURE  3D TREATMENT PLANNING AND DOSIMETRY:  The patient's radiation plan was reviewed and approved by neurosurgery and radiation oncology prior to treatment.  It showed 3-dimensional radiation distributions overlaid onto the planning CT/MRI image set.  The [facility] for the target structures as well as the organs at risk were reviewed. The documentation of the 3D plan and dosimetry are filed in the radiation oncology EMR.  NARRATIVE:  Melanie Liu was brought to the TrueBeam stereotactic radiation treatment machine and placed supine on the CT couch. The head frame was applied, and the patient was set up for stereotactic radiosurgery.  Neurosurgery was present for the set-up and delivery  SIMULATION VERIFICATION:  In the couch zero-angle position, the patient underwent Exactrac imaging using the Brainlab system with orthogonal KV images.  These were carefully aligned and repeated to confirm treatment position for each of the isocenters.  The Exactrac snap film verification was repeated at each couch angle.  SPECIAL TREATMENT PROCEDURE: Melanie Liu received stereotactic radiosurgery to the following targets:  Right Parietal 18 mm target was treated using 4 Dynamic Conformal Arcs to a prescription dose of 20 Gy.  ExacTrac Snap verification was performed for each couch angle. The 82% isodose line was prescribed.  6 MV photons were delivered in the flattening filter free beam mode  Left Frontal 5 mm target was treated using 2 Circular Arcs to a prescription dose of 20 Gy.  ExacTrac Snap verification was performed for each couch angle. The 7.5 mm collimator was used for one arc, and the 10 mm collimator was used for the other in order to optimize the  shape of the radiation dose distribution.  The 76.3% isodose line was prescribed.  6 MV photons were delivered in the flattening filter free beam mode  STEREOTACTIC TREATMENT MANAGEMENT:  Following delivery, the patient was evaluated and found to be stable.  She was then transported back to her hospital room stable condition and monitored for possible acute effects. The patient tolerated treatment without significant acute effects.    PLAN: Follow-up in one month.  ________________________________  Artist Pais. Kathrynn Running, M.D.

## 2012-04-14 NOTE — Progress Notes (Signed)
[facility] Radiation Oncology Dept Therapy Treatment Record Phone [phone]   Radiation Therapy was administered to Melanie Liu on: 04/14/2012  6:09 AM and was treatment #1 out of a planned course of 1treatments.

## 2012-04-14 NOTE — Progress Notes (Addendum)
[facility] PROGRESS NOTE  KAILENE STEINHART UEA:[id number] DOB: 1950/04/18 DOA: 04/10/2012 PCP: [name], MD  Brief narrative: 62 year old female with past medical history including but not limited to lung cancer, on current chemotherapy, COPD who presented with worsening shortness of breath over past 24 hours associated with dry cough. Patient reports no associated chest pain or palpitations. No fever but she did report chills.  In ED, evaluation included CT angio chest which ruled out pulmonary embolism. Chest x-ray shows a right upper lobe mass which is consistent with patient's history of lung cancer. BMP essentially unremarkable. Hemoglobin was 11.6.   Assessment and plan:   Principal problem  *Acute hypoxic respiratory failure  Secondary to COPD exacerbation  Improving clinically  Continue nebulizer treatments Q 4 hours PRN  Continue decadron 4 mg Q 6 hours IV  Continue azithromycin and ceftin by mouth Oxygen support via nasal cannula to keep oxygen saturation above 90%  Blood cultures so far show no growth today  Followup results of strep pneumo, flu, Legionella - all negative Active problems  Left arm and leg weakness  Secondary to brain mets  Started decadron 4 mg Q 6 hours IV, appreciate radiation oncology input RT done today, no subsequent complications. 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 Anemia of chronic disease  Secondary to history of lung cancer  Hemoglobin on admission 11.3 and for past 2 days 9.7 and 9.5 We will continue to monitor. No indications for transfusion at this time. Thrombocytopenia  Secondary to Lovenox inj which was stopped  Platelet count on admission 127 and today 52 (steadily decreased since admission). I spoke with [name] and recommendation was to f/u HIT panel and if no further drop patient may go home tomorrow if she feels  good. Use SCD's for DVT prophylaxis Check for HIT antibody Moderate protein calorie malnutrition  Nutrition consulted   Code Status: Full code  Family Communication: Family updated at bedside  Disposition Plan: Home when stable   Consultants:  None Procedures:  None Antibiotics:  Azithromycin 04/10/2012 -->  Rocephin 04/10/2012 -->   Manson Passey, MD  [facility]  Pager [phone]    If 7PM-7AM, please contact night-coverage [email or web address] Password [email or web address] 04/14/2012, 12:02 PM   LOS: 4 days    HPI/Subjective: No acute overnight events.  Objective: Filed Vitals:   04/13/12 2017 04/13/12 2107 04/14/12 0527 04/14/12 0831  BP:  121/62 135/86   Pulse:  86 90   Temp:  97.5 F (36.4 C) 97.3 F (36.3 C)   TempSrc:  Oral Oral   Resp:  18 20   Height:      Weight:      SpO2: 99% 97% 100% 98%    Intake/Output Summary (Last 24 hours) at 04/14/12 1202 Last data filed at 04/14/12 1134  Gross per 24 hour  Intake 2944.66 ml  Output   1900 ml  Net 1044.66 ml    Exam:   General:  Pt is alert, follows commands appropriately, not in acute distress  Cardiovascular: Regular rate and rhythm, S1/S2, no murmurs, no rubs, no gallops  Respiratory: Clear to auscultation bilaterally, no wheezing, no crackles, no rhonchi  Abdomen: Soft, non tender, non distended, bowel sounds present, no guarding  Extremities: No edema, pulses DP and PT palpable bilaterally  Neuro: Grossly nonfocal  Data Reviewed: Basic Metabolic Panel:  Recent Labs Lab 04/10/12 1034 04/10/12 1703 04/11/12 0358 04/12/12 0415 04/14/12  0357  NA 136 136 135 136 136  K 4.2 4.0 4.4 4.1 3.9  CL 101 99 99 102 103  CO2  --  27 26 24 22   GLUCOSE 96 97 137* 137* 124*  BUN 21 20 13 13 9   CREATININE 1.00 0.71 0.59 0.55 0.50  CALCIUM  --  9.4 9.5 9.2 9.4  MG  --  1.8  --   --   --   PHOS  --  3.2  --   --   --    Liver Function Tests:  Recent Labs Lab 04/10/12 1703 04/11/12 0358  AST 16 14  ALT 8 8  ALKPHOS  71 68  BILITOT 0.5 0.3  PROT 6.9 6.7  ALBUMIN 3.1* 3.0*   CBC:  Recent Labs Lab 04/10/12 1025 04/10/12 1034 04/10/12 1703 04/11/12 0358 04/12/12 0415 04/14/12 0357  WBC 5.6  --  4.2 2.2* 4.1 1.9*  NEUTROABS 5.1  --  3.5  --   --   --   HGB 11.3* 11.6* 10.9* 11.3* 9.7* 9.5*  HCT 33.3* 34.0* 32.9* 32.5* 29.7* 28.5*  MCV 96.2  --  95.6 95.9 96.4 96.3  PLT 127*  --  119* 111* 101* 52*   Cardiac Enzymes:  Recent Labs Lab 04/10/12 1025  TROPONINI <0.30   BNP: No components found with this basename: POCBNP,  CBG:  Recent Labs Lab 04/13/12 0740 04/14/12 0742  GLUCAP 100* 113*    Recent Results (from the past 240 hour(s))  CULTURE, BLOOD (ROUTINE X 2)     Status: None   Collection Time    04/10/12  3:32 PM      Result Value Range Status   Specimen Description BLOOD RIGHT ARM   Final   Special Requests BOTTLES DRAWN AEROBIC AND ANAEROBIC 10CC   Final   Culture  Setup Time 04/10/2012 22:03   Final   Culture     Final   Value:        BLOOD CULTURE RECEIVED NO GROWTH TO DATE CULTURE WILL BE HELD FOR 5 DAYS BEFORE ISSUING A FINAL NEGATIVE REPORT   Report Status PENDING   Incomplete  CULTURE, BLOOD (ROUTINE X 2)     Status: None   Collection Time    04/10/12  4:00 PM      Result Value Range Status   Specimen Description BLOOD RIGHT HAND   Final   Special Requests BOTTLES DRAWN AEROBIC AND ANAEROBIC 10CC   Final   Culture  Setup Time 04/10/2012 22:03   Final   Culture     Final   Value:        BLOOD CULTURE RECEIVED NO GROWTH TO DATE CULTURE WILL BE HELD FOR 5 DAYS BEFORE ISSUING A FINAL NEGATIVE REPORT   Report Status PENDING   Incomplete  MRSA PCR SCREENING     Status: None   Collection Time    04/10/12  8:11 PM      Result Value Range Status   MRSA by PCR NEGATIVE  NEGATIVE Final   Comment:            The GeneXpert MRSA Assay (FDA     approved for NASAL specimens     only), is one component of a     comprehensive MRSA colonization     surveillance program. It  is not     intended to diagnose MRSA     infection nor to guide or     monitor treatment for  MRSA infections.     Studies: No results found.  Scheduled Meds: . albuterol  2.5 mg Nebulization Q6H  . azithromycin  500 mg Oral Q24H  . calcium-vitamin D  1 tablet Oral Daily  . cefTRIAXone (ROCEPHIN)  IV  1 g Intravenous Q24H  . dexamethasone  4 mg Intravenous Q6H  . feeding supplement  1 Container Oral BID BM  . folic acid  1 mg Oral Daily  . ipratropium  0.5 mg Nebulization Q6H  . polyethylene glycol  17 g Oral Daily  . PRUTECT   Topical BID  . sucralfate  1 g Oral 3 times weekly  . vitamin E  400 Units Oral Daily   Continuous Infusions: . sodium chloride 100 mL/hr at 04/13/12 1500

## 2012-04-14 NOTE — Op Note (Signed)
INPATIENT  (admitted 2/6 after CT Simulation)  Stereotactic Treatment Procedure Note  Name: BETHA SHADIX MRN: [id number]  Date: 04/14/2012 DOB: Feb 08, 1951  SPECIAL TREATMENT PROCEDURE  3D TREATMENT PLANNING AND DOSIMETRY: The patient's radiation plan was reviewed and approved by neurosurgery and radiation oncology prior to treatment. It showed 3-dimensional radiation distributions overlaid onto the planning CT/MRI image set. The [facility] for the target structures as well as the organs at risk were reviewed. The documentation of the 3D plan and dosimetry are filed in the radiation oncology EMR.  NARRATIVE: VI BIDDINGER was brought to the TrueBeam stereotactic radiation treatment machine and placed supine on the CT couch. The head frame was applied, and the patient was set up for stereotactic radiosurgery. Neurosurgery and Radiation Oncology were present for the set-up and delivery  SIMULATION VERIFICATION: In the couch zero-angle position, the patient underwent Exactrac imaging using the Brainlab system with orthogonal KV images. These were carefully aligned and repeated to confirm treatment position for each of the isocenters. The Exactrac snap film verification was repeated at each couch angle.  SPECIAL TREATMENT PROCEDURE: [name] received stereotactic radiosurgery to the following targets:  Right Parietal 18 mm target was treated using 4 Dynamic Conformal Arcs to a prescription dose of 20 Gy. ExacTrac Snap verification was performed for each couch angle. The 82% isodose line was prescribed. 6 MV photons were delivered in the flattening filter free beam mode  Left Frontal 5 mm target was treated using 2 Circular Arcs to a prescription dose of 20 Gy. ExacTrac Snap verification was performed for each couch angle. The 7.5 mm collimator was used for one arc, and the 10 mm collimator was used for the other in order to optimize the shape of the radiation dose distribution. The 76.3% isodose line was  prescribed. 6 MV photons were delivered in the flattening filter free beam mode  STEREOTACTIC TREATMENT MANAGEMENT: Following delivery, the patient was evaluated and found to be stable. She was then transported back to her hospital room stable condition and monitored for possible acute effects. The patient tolerated treatment without significant acute effects.  PLAN: Follow-up in one month.  ________________________________   Danae Orleans. Venetia Maxon, M.D.

## 2012-04-14 NOTE — Progress Notes (Signed)
Physical Therapy Treatment Patient Details Name: Melanie Liu MRN: [id number] DOB: [birth year] Today's Date: 04/14/2012 Time: 4098-1191 PT Time Calculation (min): 31 min  PT Assessment / Plan / Recommendation Comments on Treatment Session  Pt is improving in L side strength and functional mobility.  She continues to have some decreased coordination, especially with LUE and will benefit from HHPT and RW at discharge    Follow Up Recommendations  Home health PT     Does the patient have the potential to tolerate intense rehabilitation     Barriers to Discharge        Equipment Recommendations  Rolling walker with 5" wheels    Recommendations for Other Services OT consult  Frequency Min 3X/week   Plan Discharge plan remains appropriate;Frequency remains appropriate    Precautions / Restrictions Precautions Precautions: Fall Restrictions Weight Bearing Restrictions: No   Pertinent Vitals/Pain No c/o pain,  Mild dyspnea on exertion perceptible to patient    Mobility  Bed Mobility Bed Mobility: Supine to Sit;Sit to Supine Supine to Sit: 7: Independent Sit to Supine: 7: Independent Transfers Transfers: Sit to Stand;Stand to Sit Sit to Stand: 5: Supervision;7: Independent Stand to Sit: 7: Independent Details for Transfer Assistance: pt needs to have a hand on something for support during mobility Ambulation/Gait Ambulation/Gait Assistance: 5: Supervision Ambulation Distance (Feet): 400 Feet Assistive device: None Ambulation/Gait Assistance Details: encouraged pt to move left arm reciprocally with right leg Gait Pattern: Step-through pattern (decreased left arm swing) Gait velocity: WFL General Gait Details: improved gait.  Pt now able to walk without device on level unobstructed surfaces,but still she will need it when out of doors or in complicated environment. Only minor dyspnea Stairs: Yes Stairs Assistance: 4: Min guard Stair Management Technique: One rail  Right Number of Stairs: 2 Wheelchair Mobility Wheelchair Mobility: No    Exercises General Exercises - Lower Extremity Ankle Circles/Pumps: AROM;Both;5 reps;Supine Hip ABduction/ADduction: AROM;Left;Sidelying;5 reps Straight Leg Raises: AROM;Both;5 reps;Supine Hip Flexion/Marching: AROM;Both;Standing;Limitations Hip Flexion/Marching Limitations: needs hand hold support on counter for balance Other Exercises Other Exercises: sit to stand x 5 repetitions Other Exercises: bilateral UE hip to hip to activate core  Other Exercises: standing balance  Other Exercises: pt issued written handout of beginning core and LE strengthening that includes above exercise Hard copy of exercise program placed in shadow chart   PT Diagnosis:    PT Problem List:   PT Treatment Interventions:     PT Goals Acute Rehab PT Goals PT Goal Formulation: With patient Time For Goal Achievement: 04/25/12 Potential to Achieve Goals: Good Pt will Ambulate: >150 feet;with modified independence PT Goal: Ambulate - Progress: Progressing toward goal Pt will Go Up / Down Stairs: 3-5 stairs;with min assist PT Goal: Up/Down Stairs - Progress: Progressing toward goal Pt will Perform Home Exercise Program: with supervision, verbal cues required/provided PT Goal: Perform Home Exercise Program - Progress: Progressing toward goal  Visit Information  Last PT Received On: 04/14/12 Assistance Needed: +1    Subjective Data  Subjective: I walked with the walker yesterday Patient Stated Goal: to go home tomorrow   Cognition  Cognition Overall Cognitive Status: Appears within functional limits for tasks assessed/performed Arousal/Alertness: Awake/alert Orientation Level: Appears intact for tasks assessed Behavior During Session: [facility] for tasks performed    Balance     End of Session PT - End of Session Activity Tolerance: Patient tolerated treatment well Patient left: in bed Nurse Communication: Mobility status    GP    Bayard Hugger.  Manson Passey, PT [phone] 04/14/2012, 1:51 PM

## 2012-04-14 NOTE — Addendum Note (Signed)
Encounter addended by: Maeola Harman, MD on: 04/14/2012  9:14 PM<BR>     Documentation filed: Clinical Notes

## 2012-04-14 NOTE — Progress Notes (Signed)
02/29/12 Faxed 54 pages to Case Manager Leanora Ivanoff, RN  Fax [phone] Phone [phone] Ext [phone]  03/21/12 Faxed a 19 page update.

## 2012-04-15 DIAGNOSIS — J189 Pneumonia, unspecified organism: Secondary | ICD-10-CM

## 2012-04-15 LAB — CBC
Platelets: 41 10*3/uL — ABNORMAL LOW (ref 150–400)
RBC: 2.85 MIL/uL — ABNORMAL LOW (ref 3.87–5.11)
WBC: 2.7 10*3/uL — ABNORMAL LOW (ref 4.0–10.5)

## 2012-04-15 LAB — GLUCOSE, CAPILLARY

## 2012-04-15 MED ORDER — CEFUROXIME AXETIL 500 MG PO TABS: 500.0000 mg | ORAL_TABLET | Freq: Two times a day (BID) | ORAL | Status: DC

## 2012-04-15 MED ORDER — GUAIFENESIN 100 MG/5ML PO SOLN: 400.0000 mg | Freq: Four times a day (QID) | ORAL | Status: DC | PRN

## 2012-04-15 MED ORDER — DEXAMETHASONE 4 MG PO TABS: 4.0000 mg | ORAL_TABLET | Freq: Four times a day (QID) | ORAL | Status: DC

## 2012-04-15 MED ORDER — IPRATROPIUM BROMIDE 0.02 % IN SOLN: 0.5000 mg | RESPIRATORY_TRACT | Status: DC | PRN

## 2012-04-15 MED ORDER — OXYCODONE-ACETAMINOPHEN 5-325 MG PO TABS: 1.0000 | ORAL_TABLET | ORAL | Status: DC | PRN

## 2012-04-15 MED ORDER — POLYETHYLENE GLYCOL 3350 17 G PO PACK: 17.0000 g | PACK | Freq: Every day | ORAL | Status: DC

## 2012-04-15 MED ORDER — ALBUTEROL SULFATE (5 MG/ML) 0.5% IN NEBU: 2.5000 mg | INHALATION_SOLUTION | RESPIRATORY_TRACT | Status: DC | PRN

## 2012-04-15 MED ORDER — AZITHROMYCIN 500 MG PO TABS: 500.0000 mg | ORAL_TABLET | ORAL | Status: DC

## 2012-04-15 NOTE — Progress Notes (Signed)
  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 ExacTrac Snap verification was performed for each couch angle. The 82% isodose line was prescribed. 6 MV photons were delivered in the flattening filter free beam mode   Left Frontal 5 mm target was treated using 2 Circular Arcs to a prescription dose of 20 Gy. ExacTrac Snap verification was performed for each couch angle. The 7.5 mm collimator was used for one arc, and the 10 mm collimator was used for the other in order to optimize the shape of the radiation dose distribution. The 76.3% isodose line was prescribed. 6 MV photons were delivered in the flattening filter free beam mode  Narrative: The patient tolerated radiation treatment relatively well.   She had no acute complications.  Plan: The patient has completed radiation treatment. The patient will return to radiation oncology clinic for routine followup in one month. I advised her to call or return sooner if she has any questions or concerns related to her recovery or treatment. ________________________________  Artist Pais. Kathrynn Running, M.D.

## 2012-04-15 NOTE — Progress Notes (Signed)
Patient discharged home, all discharge medications and instructions reviewed and questions answered. Patient to be assisted to vehicle by wheelchair.  

## 2012-04-15 NOTE — Discharge Summary (Signed)
Physician Discharge Summary  Melanie Liu:[id number] DOB: Jul 06, 1950 DOA: 04/10/2012  PCP: Melanie Hector, MD  Admit date: 04/10/2012 Discharge date: 04/15/2012  Recommendations for Outpatient Follow-up:  1. Patient will followup with cancer Center Monday, 04/21/2012 for blood work. Dr. Shirline Liu was notified of platelet count and they will followup in further monitor platelets outpatient. HIT panel is still pending at the time of discharge.  Discharge Diagnoses:  Principal Problem:   COPD with acute exacerbation Active Problems:   Right Upper Lobe Lung Mass, 8 cm    Right frontal lobe lesion   Thrombocytopenia, unspecified   CAP (community acquired pneumonia)    Discharge Condition: Medically stable and clinically appears well for discharge home today.  Diet recommendation: As tolerated`  History of present illness:  62 year old female with past medical history including but not limited to lung cancer, on current chemotherapy, COPD who presented with worsening shortness of breath over past 24 hours associated with dry cough. Patient reports no associated chest pain or palpitations. No fever but she did report chills.  In ED, evaluation included CT angio chest which ruled out pulmonary embolism. Chest x-ray shows a right upper lobe mass which is consistent with patient's history of lung cancer. BMP essentially unremarkable. Hemoglobin was 11.6.   Assessment and plan:   Principal problem  *Acute hypoxic respiratory failure  Secondary to COPD exacerbation  Respiratory status stable  Continue nebulizer treatments Q 4 hours PRN  at home. DME nebulizer machine order placed  Continue decadron 4 mg Q 6 hours by mouth. Spoke with radiation oncology and they recommended continuing this regimen. They will call the patient at home if they decide to start tapering Decadron down.  Continue azithromycin and ceftin by mouth  for one more day on discharge which should complete a total of  7 days of antibiotics.  Blood cultures so far show no growth today  Followup results of strep pneumo, flu, Legionella - all negative Active problems  Left arm and leg weakness  Secondary to brain mets  Started decadron 4 mg Q 6 hours  IV and patient will continue the same regimen by mouth on discharge  RT done 04/14/2012, no subsequent complications. 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 I spoke with Dr. Arbutus Liu and recommendation was to f/u HIT panel and if no further drop patient may go home tomorrow if she feels good.  Use SCD's for DVT prophylaxis  Check for HIT antibody - pending Moderate protein calorie malnutrition  Nutrition consulted  Code Status: Full code  Family Communication: Family updated at bedside   Consultants:  Dr. Arbutus Liu - notified of patient's admission; recommendation in regards to thrombocytopenia Procedures:  None Antibiotics:  Azithromycin 04/10/2012 --> will be continued for one more day on discharge Rocephin 04/10/2012 --> 04/14/2012 then changed to PO ceftin from 04/14/2012 -->will be continued for one more day on discharge   Discharge Exam: Filed Vitals:   04/15/12 0300  BP: 116/66  Pulse: 68  Temp: 97.9 F (36.6 C)  Resp: 20   Filed Vitals:   04/14/12 1413 04/14/12 2307 04/15/12 0300 04/15/12 0827  BP: 117/74 112/73 116/66   Pulse: 97 102 68   Temp: 97.5 F (36.4 C) 97.7 F (36.5 C) 97.9 F (36.6 C)   TempSrc: Oral Oral Oral   Resp: 18 20 20    Height:      Weight:   55.7 kg (122 lb 12.7 oz)   SpO2: 95% 96% 94% 97%    General: Pt is alert, follows  commands appropriately, not in acute distress Cardiovascular: Regular rate and rhythm, S1/S2 +, no murmurs, no rubs, no gallops Respiratory: Clear to auscultation bilaterally, no wheezing, no crackles, no rhonchi Abdominal: Soft, non tender, non distended, bowel sounds +, no guarding Extremities: no edema, no cyanosis, pulses palpable bilaterally DP and PT Neuro: Grossly nonfocal  Discharge Instructions  Discharge Orders   Future Appointments Provider Department Dept Phone   04/21/2012 12:45 PM [facility] [phone]   04/28/2012 1:15 PM [facility] [phone]   04/28/2012 1:45 PM Melanie Slipper, PA [facility] [phone]   04/28/2012 2:45 PM [facility] [place] [facility] [phone]   05/05/2012 12:45 PM [facility] [place] [facility] [phone]   05/12/2012 7:45 AM Melanie George, MD [facility] [phone]   05/12/2012 11:45 AM [facility] [place] [facility] [phone]   05/26/2012 9:00 AM Melanie Hurt, MD [place] [facility] RAD[facility] ONCOL[facility] [phone]   Future Orders Complete By Expires     Call MD for:  difficulty breathing, headache or visual disturbances  As directed     Call MD for:  persistant dizziness or light-headedness  As directed     Call MD for:  persistant nausea and vomiting  As directed     Call MD for:  severe uncontrolled pain  As directed     Diet - low sodium heart healthy  As directed     Discharge instructions  As directed     Comments:      Please continue Decadron 4 mg every 6 hours. You will be notified by radiation oncology should you taper down to Decadron.    Increase activity slowly  As directed         Medication List    TAKE these medications       albuterol (5 MG/ML) 0.5% nebulizer solution   Commonly known as:  PROVENTIL  Take 0.5 mLs (2.5 mg total) by nebulization every 4 (four) hours as needed for wheezing or shortness of breath.     azithromycin 500 MG tablet  Commonly known as:  ZITHROMAX  Take 1 tablet (500 mg total) by mouth daily.     calcium-vitamin D 500-200 MG-UNIT per tablet  Commonly known as:  OSCAL WITH D  Take 1 tablet by mouth daily.     cefUROXime 500 MG tablet  Commonly known as:  CEFTIN  Take 1 tablet (500 mg total) by mouth 2 (two) times daily with a meal.     dexamethasone 4 MG tablet  Commonly known as:  DECADRON  4 mg by mouth twice a day the day before, day of and day after the chemotherapy every 3 weeks     dexamethasone 4 MG tablet  Commonly known as:  DECADRON  Take 1 tablet (4 mg total) by mouth every 6 (six) hours.     emollient cream  Commonly known as:  BIAFINE  Apply topically 2 (two) times daily.     folic acid 1 MG tablet  Commonly known as:  FOLVITE  Take 1 tablet (1 mg total) by mouth daily.     glucosamine-chondroitin 500-400 MG tablet  Take 1 tablet by mouth daily.     guaiFENesin 100 MG/5ML Soln  Commonly known as:  ROBITUSSIN  Take 20 mLs (400 mg total) by mouth every 6 (six) hours as needed.     ipratropium 0.02 % nebulizer solution  Commonly known as:  ATROVENT  Take 2.5 mLs (0.5 mg total) by nebulization every 4 (four) hours as needed.     oxyCODONE-acetaminophen 5-325 MG per tablet  Commonly known as:  PERCOCET/ROXICET  Take 1-2 tablets by mouth every 4 (four) hours as needed for pain.     polyethylene glycol packet  Commonly known as:  MIRALAX / GLYCOLAX  Take 17 g by mouth daily.     PRESCRIPTION MEDICATION  Place into the left eye every 30 (thirty) days. Bevacizumab (Avastin) inj into the left eye     sucralfate 1 G tablet  Commonly known as:  CARAFATE  Take 1 g by mouth 3 (three) times a week. 5 min before food     TUSSIONEX PENNKINETIC ER 10-8 MG/5ML Lqcr  Generic drug:   chlorpheniramine-HYDROcodone  Take 5 mLs by mouth every 12 (twelve) hours as needed. Called in by MD to [name] ,[place]. [phone]     vitamin E 400 UNIT capsule  Take 400 Units by mouth daily.           Follow-up Information   Follow up with Melanie Hector, MD. (As needed)    Contact information:   [address] [facility] [address] [phone]        The results of significant diagnostics from this hospitalization (including imaging, microbiology, ancillary and laboratory) are listed below for reference.    Significant Diagnostic Studies: Dg Chest 2 View  04/10/2012  *RADIOLOGY REPORT*  Clinical Data: Short of breath  CHEST - 2 VIEW  Comparison: 11/23/2011  Findings: Thorax is rotated to the right limiting the study.  Right upper lobe mass is smaller.  Lungs remain hyperaerated.  Normal heart size.  No pneumothorax.  IMPRESSION: Thorax rotated to the right limiting the study.  Right upper lobe mass is smaller compatible with a positive response to therapy.   Original Report Authenticated By: Jolaine Click, M.D.    Ct Angio Chest Pe W/cm &/or Wo Cm  04/10/2012  *RADIOLOGY REPORT*  Clinical Data: History of lung carcinoma, shortness of breath, cough  CT ANGIOGRAPHY CHEST  Technique:  Multidetector CT imaging of the chest using the standard protocol during bolus administration of intravenous contrast. Multiplanar reconstructed images including MIPs were obtained and reviewed to evaluate the vascular anatomy.  Contrast: 80mL OMNIPAQUE IOHEXOL 350 MG/ML SOLN  Comparison: 03/10/2012  Findings: Diffuse emphysematous changes are noted within the lungs bilaterally.  There is a persistent right upper lobe mass lesion identified which measures approximately 6.9 x 5.6 cm.  This is slightly decreased in size when compared with the prior exam. It causes some narrowing of the bronchial tree on the right and extends into the hilum as previously described.  The thoracic aorta is well  visualized within normal branching pattern.  No findings to suggest aneurysm or aortic dissection are seen.  The pulmonary artery is also well visualized and demonstrates a normal enhancement pattern.  The arterial branches in the right upper lobe are somewhat displaced by  the patient's known mass lesion.  No filling defects to suggest pulmonary emboli are identified bilaterally.  Degenerative changes of the thoracic spine are seen.  No acute bony abnormality is noted.  IMPRESSION: No evidence of pulmonary emboli.  Persistent right upper lobe mass lesion which is slightly decreased in size when compared with the prior exam.  No new focal abnormality is noted.   Original Report Authenticated By: Alcide Clever, M.D.    Mr [name] ZO Contrast  03/28/2012  *RADIOLOGY REPORT*  Clinical Data: Metastatic lung cancer.  11-month restaging stereotactic radiosurgery.  MRI HEAD WITHOUT AND WITH CONTRAST  Technique:  Multiplanar, multiecho pulse sequences of the brain and surrounding structures were obtained according to standard protocol without and with intravenous contrast  Contrast: 9mL MULTIHANCE GADOBENATE DIMEGLUMINE 529 MG/ML IV SOLN  Comparison: MRI 12/21/2011  Findings: Right frontal mass lesion is considerably smaller now measuring 12 x 10 mm with a thin enhancing wall.  Considerable improvement in surrounding white matter edema.  New ring-enhancing necrotic lesion right parietal lobe measures 15 x 18 mm.  There is a large amount of surrounding white matter edema.  I do not appreciate any abnormality in this area on the prior study.  New 5 mm enhancing lesion left frontal parietal cortex, with a small amount of surrounding edema.  I do not appreciate any abnormal enhancement in this area on the prior study.  Ventricle size is normal.  No shift of the midline structures.  No acute infarct or hemorrhage.  IMPRESSION: Significant improvement and right frontal lobe metastatic deposit.  New metastatic lesion right parietal  lobe measures 15 x 18 mm with considerable surrounding edema.  New 5 mm lesion left frontal parietal cortex.   Original Report Authenticated By: Janeece Riggers, M.D.     Microbiology: Recent Results (from the past 240 hour(s))  CULTURE, BLOOD (ROUTINE X 2)     Status: None   Collection Time    04/10/12  3:32 PM      Result Value Range Status   Specimen Description BLOOD RIGHT ARM   Final   Special Requests BOTTLES DRAWN AEROBIC AND ANAEROBIC 10CC   Final   Culture  Setup Time 04/10/2012 22:03   Final   Culture     Final   Value:        BLOOD CULTURE RECEIVED NO GROWTH TO DATE CULTURE WILL BE HELD FOR 5 DAYS BEFORE ISSUING A FINAL NEGATIVE REPORT   Report Status PENDING   Incomplete  CULTURE, BLOOD (ROUTINE X 2)     Status: None   Collection Time    04/10/12  4:00 PM      Result Value Range Status   Specimen Description BLOOD RIGHT HAND   Final   Special Requests BOTTLES DRAWN AEROBIC AND ANAEROBIC 10CC   Final   Culture  Setup Time 04/10/2012 22:03   Final   Culture     Final   Value:        BLOOD CULTURE RECEIVED NO GROWTH TO DATE CULTURE WILL BE HELD FOR 5 DAYS BEFORE ISSUING A FINAL NEGATIVE REPORT   Report Status PENDING   Incomplete  MRSA PCR SCREENING     Status: None   Collection Time    04/10/12  8:11 PM      Result Value Range Status   MRSA by PCR NEGATIVE  NEGATIVE Final   Comment:            The GeneXpert MRSA Assay (FDA  approved for NASAL specimens     only), is one component of a     comprehensive MRSA colonization     surveillance program. It is not     intended to diagnose MRSA     infection nor to guide or     monitor treatment for     MRSA infections.     Labs: Basic Metabolic Panel:  Recent Labs Lab 04/10/12 1034 04/10/12 1703 04/11/12 0358 04/12/12 0415 04/14/12 0357  NA 136 136 135 136 136  K 4.2 4.0 4.4 4.1 3.9  CL 101 99 99 102 103  CO2  --  27 26 24 22   GLUCOSE 96 97 137* 137* 124*  BUN 21 20 13 13 9   CREATININE 1.00 0.71 0.59 0.55  0.50  CALCIUM  --  9.4 9.5 9.2 9.4  MG  --  1.8  --   --   --   PHOS  --  3.2  --   --   --    Liver Function Tests:  Recent Labs Lab 04/10/12 1703 04/11/12 0358  AST 16 14  ALT 8 8  ALKPHOS 71 68  BILITOT 0.5 0.3  PROT 6.9 6.7  ALBUMIN 3.1* 3.0*   No results found for this basename: LIPASE, AMYLASE,  in the last 168 hours No results found for this basename: AMMONIA,  in the last 168 hours CBC:  Recent Labs Lab 04/10/12 1025  04/10/12 1703 04/11/12 0358 04/12/12 0415 04/14/12 0357 04/15/12 0340  WBC 5.6  --  4.2 2.2* 4.1 1.9* 2.7*  NEUTROABS 5.1  --  3.5  --   --   --   --   HGB 11.3*  < > 10.9* 11.3* 9.7* 9.5* 9.2*  HCT 33.3*  < > 32.9* 32.5* 29.7* 28.5* 27.5*  MCV 96.2  --  95.6 95.9 96.4 96.3 96.5  PLT 127*  --  119* 111* 101* 52* 41*  < > = values in this interval not displayed. Cardiac Enzymes:  Recent Labs Lab 04/10/12 1025  TROPONINI <0.30   BNP: BNP (last 3 results) No results found for this basename: PROBNP,  in the last 8760 hours CBG:  Recent Labs Lab 04/13/12 0740 04/14/12 0742 04/15/12 0732  GLUCAP 100* 113* 93    Time coordinating discharge: Over 30 minutes  Signed:  Manson Passey, MD  [facility]  04/15/2012, 10:18 AM  Pager #: [phone]

## 2012-04-15 NOTE — Care Management (Signed)
Cm spoke with patient concerning dc planning. Per pt choice [facility] to provide [employer] [facility] [employer] services upon discharge. Pt request RW+ nebulizer. [facility] notified of new referral & DME request. Dme delivery in room prior to discharge. No other HH, DME, or needs stated.  Roxy Manns Koby Pickup,RN,BSN [phone]

## 2012-04-16 LAB — CULTURE, BLOOD (ROUTINE X 2): Culture: NO GROWTH

## 2012-04-18 LAB — HEPARIN INDUCED THROMBOCYTOPENIA PNL
UFH High Dose UFH H: 0 % Release
UFH Low Dose 0.1 IU/mL: 0 % Release
UFH Low Dose 0.5 IU/mL: 0 % Release

## 2012-04-21 ENCOUNTER — Other Ambulatory Visit (HOSPITAL_BASED_OUTPATIENT_CLINIC_OR_DEPARTMENT_OTHER): Payer: Medicaid Other

## 2012-04-21 ENCOUNTER — Other Ambulatory Visit: Payer: Self-pay | Admitting: Physician Assistant

## 2012-04-21 ENCOUNTER — Encounter: Payer: Self-pay | Admitting: Physician Assistant

## 2012-04-21 DIAGNOSIS — C3490 Malignant neoplasm of unspecified part of unspecified bronchus or lung: Secondary | ICD-10-CM

## 2012-04-21 DIAGNOSIS — C3410 Malignant neoplasm of upper lobe, unspecified bronchus or lung: Secondary | ICD-10-CM

## 2012-04-21 LAB — CBC WITH DIFFERENTIAL/PLATELET
BASO%: 0.9 % (ref 0.0–2.0)
Basophils Absolute: 0 10*3/uL (ref 0.0–0.1)
HCT: 27.9 % — ABNORMAL LOW (ref 34.8–46.6)
HGB: 9.1 g/dL — ABNORMAL LOW (ref 11.6–15.9)
MONO#: 0.4 10*3/uL (ref 0.1–0.9)
NEUT%: 63.5 % (ref 38.4–76.8)
WBC: 2.3 10*3/uL — ABNORMAL LOW (ref 3.9–10.3)
lymph#: 0.4 10*3/uL — ABNORMAL LOW (ref 0.9–3.3)

## 2012-04-21 LAB — COMPREHENSIVE METABOLIC PANEL (CC13)
AST: 22 U/L (ref 5–34)
Albumin: 3.4 g/dL — ABNORMAL LOW (ref 3.5–5.0)
BUN: 24.5 mg/dL (ref 7.0–26.0)
Calcium: 9.8 mg/dL (ref 8.4–10.4)
Chloride: 101 mEq/L (ref 98–107)
Glucose: 118 mg/dl — ABNORMAL HIGH (ref 70–99)
Potassium: 4 mEq/L (ref 3.5–5.1)
Sodium: 138 mEq/L (ref 136–145)
Total Protein: 7 g/dL (ref 6.4–8.3)

## 2012-04-21 NOTE — Progress Notes (Signed)
Film patient made her aware that her platelet count is very low. Review leading precautions with the patient who voiced understanding. Patient is aware that should she develop any bleeding from any site she is to seek emergency medical evaluation.  Laural Benes, Quyen Cutsforth E, PA-C

## 2012-04-21 NOTE — Progress Notes (Signed)
Quick Note:  Call patient with the result and give bleeding precautions. ______ 

## 2012-04-22 ENCOUNTER — Telehealth: Payer: Self-pay | Admitting: Medical Oncology

## 2012-04-22 NOTE — Telephone Encounter (Signed)
Message copied by Charma Igo on Tue Apr 22, 2012 11:06 AM ------      Message from: [name]      Created: Mon Apr 21, 2012  2:40 PM       Call patient with the result and give bleeding precautions ------

## 2012-04-22 NOTE — Telephone Encounter (Signed)
Pt said Adrena called her yesterday about this . I reiterated bleeding precautions.

## 2012-04-28 ENCOUNTER — Ambulatory Visit (HOSPITAL_BASED_OUTPATIENT_CLINIC_OR_DEPARTMENT_OTHER): Payer: Medicaid Other | Admitting: Physician Assistant

## 2012-04-28 ENCOUNTER — Other Ambulatory Visit (HOSPITAL_BASED_OUTPATIENT_CLINIC_OR_DEPARTMENT_OTHER): Payer: Medicaid Other | Admitting: Lab

## 2012-04-28 ENCOUNTER — Encounter: Payer: Self-pay | Admitting: Physician Assistant

## 2012-04-28 ENCOUNTER — Telehealth: Payer: Self-pay | Admitting: Internal Medicine

## 2012-04-28 ENCOUNTER — Telehealth: Payer: Self-pay | Admitting: *Deleted

## 2012-04-28 ENCOUNTER — Ambulatory Visit: Payer: PRIVATE HEALTH INSURANCE

## 2012-04-28 DIAGNOSIS — C3410 Malignant neoplasm of upper lobe, unspecified bronchus or lung: Secondary | ICD-10-CM

## 2012-04-28 DIAGNOSIS — C7931 Secondary malignant neoplasm of brain: Secondary | ICD-10-CM

## 2012-04-28 DIAGNOSIS — B370 Candidal stomatitis: Secondary | ICD-10-CM

## 2012-04-28 DIAGNOSIS — C3490 Malignant neoplasm of unspecified part of unspecified bronchus or lung: Secondary | ICD-10-CM

## 2012-04-28 LAB — CBC WITH DIFFERENTIAL/PLATELET
BASO%: 0.5 % (ref 0.0–2.0)
Basophils Absolute: 0.1 10*3/uL (ref 0.0–0.1)
EOS%: 0 % (ref 0.0–7.0)
HGB: 11.1 g/dL — ABNORMAL LOW (ref 11.6–15.9)
MCH: 33 pg (ref 25.1–34.0)
MCHC: 32.8 g/dL (ref 31.5–36.0)
MONO%: 9.1 % (ref 0.0–14.0)
RBC: 3.36 10*6/uL — ABNORMAL LOW (ref 3.70–5.45)
RDW: 19.7 % — ABNORMAL HIGH (ref 11.2–14.5)
lymph#: 0.5 10*3/uL — ABNORMAL LOW (ref 0.9–3.3)
nRBC: 10 % — ABNORMAL HIGH (ref 0–0)

## 2012-04-28 LAB — TECHNOLOGIST REVIEW

## 2012-04-28 MED ORDER — FLUCONAZOLE 100 MG PO TABS: ORAL_TABLET | ORAL | Status: DC

## 2012-04-28 NOTE — Patient Instructions (Addendum)
Your platelet count is too low to proceed with chemotherapy today. Followup in 1 week with repeat labs and proceed with chemotherapy as long as you're counts are within normal ranges Followup with [name] in 4 weeks with restaging CT scan of your chest, abdomen and pelvis to reevaluate your disease

## 2012-04-28 NOTE — Telephone Encounter (Signed)
Per staff message and POF I have scheduled appts.  [name]  

## 2012-04-28 NOTE — Progress Notes (Signed)
[facility] Telephone:([phone]) [phone]   [phone]  OFFICE PROGRESS NOTE  [name], MD [address] [place] [address]  DIAGNOSIS: Metastatic non-small cell lung cancer, adenocarcinoma with negative EGFR mutation and negative ALK gene translocation diagnosed in September of 2013 with metastatic single brain lesion.  PRIOR THERAPY:  1) Status post stereotactic radiosurgery to the single brain lesion under the care Dr. Kathrynn Running. 2) Concurrent chemoradiation with chemotherapy in the form of weekly carboplatin for an AUC of 2 and paclitaxel at 45 mg per meter squared concurrent with radiation therapy under the care Dr. Kathrynn Running with partial response in her disease  CURRENT THERAPY: Systemic chemotherapy with carboplatin for AUC of 5 and Alimta 500 mg/M2 every 3 weeks. Status post 2 cycles  INTERVAL HISTORY: Melanie Liu 62 y.o. female returns to the clinic today for followup visit. She states that she is being tapered off the dexamethasone is now taking 4 mg twice daily. She complains of her mouth being sore. She did not experience any bleeding or bruising as a result of her platelets being very well. She complains of her legs hurting at night but does obtain relief with her pain medication.  She voiced no other specific complaints today.The patient denied having any significant weight loss or night sweats. She denied having any significant chest pain, shortness breath, or hemoptysis.   MEDICAL HISTORY: Past Medical History  Diagnosis Date  . Nodule of right lung     right upper lobe  . Anemia   . Rectovaginal fistula   . Lung cancer 11/23/11    biopsy-right  . Right frontal lobe lesion 12/13/11    CT head  . History of radiation therapy 12/24/11-02/08/12    rul 66Gy/15fxs    ALLERGIES:  is allergic to levaquin.  MEDICATIONS:  Current Outpatient Prescriptions  Medication Sig Dispense Refill  . albuterol (PROVENTIL) (5 MG/ML) 0.5% nebulizer  solution Take 0.5 mLs (2.5 mg total) by nebulization every 4 (four) hours as needed for wheezing or shortness of breath.  20 mL  3  . azithromycin (ZITHROMAX) 500 MG tablet Take 1 tablet (500 mg total) by mouth daily.  1 tablet  0  . calcium-vitamin D (OSCAL WITH D) 500-200 MG-UNIT per tablet Take 1 tablet by mouth daily.      . cefUROXime (CEFTIN) 500 MG tablet Take 1 tablet (500 mg total) by mouth 2 (two) times daily with a meal.  2 tablet  0  . chlorpheniramine-HYDROcodone (TUSSIONEX PENNKINETIC ER) 10-8 MG/5ML LQCR Take 5 mLs by mouth every 12 (twelve) hours as needed. [place] [place] [facility]. [phone]      . dexamethasone (DECADRON) 4 MG tablet 4 mg by mouth twice a day the day before, day of and day after the chemotherapy every 3 weeks  40 tablet  1  . dexamethasone (DECADRON) 4 MG tablet Take 1 tablet (4 mg total) by mouth every 6 (six) hours.  120 tablet  0  . emollient (BIAFINE) cream Apply topically 2 (two) times daily.      . fluconazole (DIFLUCAN) 100 MG tablet Take 2 tablets by mouth on day one then take 1 tablet by mouth daily until completed  16 tablet  0  . folic acid (FOLVITE) 1 MG tablet Take 1 tablet (1 mg total) by mouth daily.  30 tablet  5  . glucosamine-chondroitin 500-400 MG tablet Take 1 tablet by mouth daily.       [name]  guaiFENesin (ROBITUSSIN) 100 MG/5ML SOLN Take 20 mLs (400 mg total) by mouth every 6 (six) hours as needed.  1200 mL  0  . ipratropium (ATROVENT) 0.02 % nebulizer solution Take 2.5 mLs (0.5 mg total) by nebulization every 4 (four) hours as needed.  75 mL  3  . oxyCODONE-acetaminophen (PERCOCET/ROXICET) 5-325 MG per tablet Take 1-2 tablets by mouth every 4 (four) hours as needed for pain.  120 tablet  0  . polyethylene glycol (MIRALAX / GLYCOLAX) packet Take 17 g by mouth daily.  14 each  3  . PRESCRIPTION MEDICATION Place into the left eye every 30 (thirty) days. Bevacizumab (Avastin) inj into the left eye      . sucralfate (CARAFATE) 1 G  tablet Take 1 g by mouth 3 (three) times a week. 5 min before food      . vitamin E 400 UNIT capsule Take 400 Units by mouth daily.       No current facility-administered medications for this visit.    SURGICAL HISTORY:  Past Surgical History  Procedure Laterality Date  . Partial hysterectomy    . Hemmorhoid surgery    . Neck surgery    . Retinal detachment surgery      REVIEW OF SYSTEMS:  Pertinent items are noted in HPI.   PHYSICAL EXAMINATION: General appearance: alert, cooperative and no distress Head: Normocephalic, without obvious abnormality, atraumatic Neck: no adenopathy Lymph nodes: Cervical, supraclavicular, and axillary nodes normal. Resp: clear to auscultation bilaterally Cardio: regular rate and rhythm, S1, S2 normal, no murmur, click, rub or gallop GI: soft, non-tender; bowel sounds normal; no masses,  no organomegaly Extremities: extremities normal, atraumatic, no cyanosis or edema Neurologic: Alert and oriented X 3, normal strength and tone. Normal symmetric reflexes. Normal coordination and gait Mouth reveals significant thrush on the tongue as well as the soft palate  ECOG PERFORMANCE STATUS: 1 - Symptomatic but completely ambulatory  Blood pressure 124/80, pulse 128, temperature 97.1 F (36.2 C), temperature source Oral, resp. rate 18, height 5\' 2"  (1.575 m), weight 102 lb 3.2 oz (46.358 kg).  LABORATORY DATA: Lab Results  Component Value Date   WBC 11.0* 04/28/2012   HGB 11.1* 04/28/2012   HCT 33.8* 04/28/2012   MCV 100.6 04/28/2012   PLT 86* 04/28/2012      Chemistry      Component Value Date/Time   NA 138 04/21/2012 1246   NA 136 04/14/2012 0357   K 4.0 04/21/2012 1246   K 3.9 04/14/2012 0357   CL 101 04/21/2012 1246   CL 103 04/14/2012 0357   CO2 26 04/21/2012 1246   CO2 22 04/14/2012 0357   BUN 24.5 04/21/2012 1246   BUN 9 04/14/2012 0357   CREATININE 0.7 04/21/2012 1246   CREATININE 0.50 04/14/2012 0357      Component Value Date/Time   CALCIUM 9.8  04/21/2012 1246   CALCIUM 9.4 04/14/2012 0357   ALKPHOS 65 04/21/2012 1246   ALKPHOS 68 04/11/2012 0358   AST 22 04/21/2012 1246   AST 14 04/11/2012 0358   ALT 27 04/21/2012 1246   ALT 8 04/11/2012 0358   BILITOT 0.27 04/21/2012 1246   BILITOT 0.3 04/11/2012 0358       RADIOGRAPHIC STUDIES: Ct Chest W Contrast  03/10/2012  *RADIOLOGY REPORT*  Clinical Data: Lung cancer.  Chemotherapy in progress.  Radiation therapy complete.  CT CHEST WITH CONTRAST  Technique:  Multidetector CT imaging of the chest was performed following the standard protocol during bolus administration  of intravenous contrast.  Contrast: 80mL OMNIPAQUE IOHEXOL 300 MG/ML  SOLN  Comparison: Chest CT 11/07/2011.  Findings:  Mediastinum: Heart size is normal. A trace amount of anterior pericardial fluid and/or thickening, unlikely to be of hemodynamic significance at this time.  There is prominent right suprahilar soft tissue which is largely contiguous with the right upper lobe mass.  There is at least one enlarged right hilar lymph node measuring up to 11 mm in short axis (image 24 of series 2).  No other pathologic mediastinal or left hilar lymphadenopathy is noted.  Esophagus is unremarkable in appearance.  Lungs/Pleura: Previously noted mass in the posterior aspect of the right upper lobe has decreased in size, currently measuring 7.3 x 6.0 cm.  This mass makes a broad contact with the pleura along nearly 50% of its circumference, and has slightly spiculated margins with some retraction of the overlying pleura posterolaterally.  This mass also abuts the major fissure, without definite extension into the superior segment of the right lower lobe.  This mass also makes a large amount of contact with the mediastinum where exerts mass effect upon adjacent structures and blurs the fat plane between the lesion and the adjacent esophagus such that some degree of mediastinal invasion is difficult to entirely exclude.  Distal to the mass within the  adjacent portions of the right upper lobe there is some mild postobstructive changes, likely pneumonitis.  There is a background of moderate - severe centrilobular emphysema and mild diffuse bronchial wall thickening. No pleural effusions.  Upper Abdomen: There appears to be a rim calcified gallstone in the gallbladder, however, this is incompletely visualized.  Musculoskeletal: There are no aggressive appearing lytic or blastic lesions noted in the visualized portions of the skeleton. Orthopedic fixation hardware in the lower cervical spine is incidentally noted.  IMPRESSION: 1.  Today's study demonstrates a positive response to therapy with decreased in size of large right upper lobe mass.  There continues to be some right hilar lymphadenopathy, however, previously noted low right paratracheal lymphadenopathy appears decreased in size as well.  No new lesions are otherwise noted. 2.  Cholelithiasis. 3.  Interval development of trace amount of anterior pericardial fluid and/or thickening, unlikely to be of hemodynamic significance at this time. 4.  Mild diffuse bronchial wall thickening with moderate - severe centrilobular emphysema; imaging findings compatible with underlying COPD.   Original Report Authenticated By: Trudie Reed, M.D.     ASSESSMENT/PLAN:  This is a very pleasant 62 years old white female with metastatic non-small cell lung cancer, adenocarcinoma with negative EGFR mutation and negative ALK gene translocation is status post a stereotactic radiotherapy to the brain lesion followed by concurrent chemoradiation with weekly carboplatin and paclitaxel. The patient had significant improvement in her disease but continues to have a residual mass in the right upper lobe. She's now being treated with systemic chemotherapy in the form of carboplatin for an AUC of 5 and Alimta 500 mg per meter square given every 3 weeks status post 2 cycles. Patient was discussed with [name]. Her platelet count  has improved from 18,002 86,000 however this is still subtherapeutic proceed with cycle #3 of her chemotherapy as scheduled today. We'll postpone her chemotherapy by one week and as long as her counts are within normal ranges she will proceed with cycle #3 next week and followup with [name] in 4 weeks with repeat CBC differential, C. met and CT of the chest, abdomen and pelvis with contrast to reevaluate her  disease. To address the oral candidiasis a prescription for Diflucan was sent to her pharmacy of record via E. scribed.   Laural Benes, Jeiden Daughtridge E, PA-C   All questions were answered. The patient knows to call the clinic with any problems, questions or concerns. We can certainly see the patient much sooner if necessary.  I spent 20 minutes counseling the patient face to face. The total time spent in the appointment was 30 minutes.

## 2012-04-30 ENCOUNTER — Encounter (HOSPITAL_COMMUNITY): Payer: Self-pay | Admitting: *Deleted

## 2012-04-30 ENCOUNTER — Emergency Department (HOSPITAL_COMMUNITY): Payer: PRIVATE HEALTH INSURANCE

## 2012-04-30 ENCOUNTER — Inpatient Hospital Stay (HOSPITAL_COMMUNITY)
Admission: EM | Admit: 2012-04-30 | Discharge: 2012-05-06 | DRG: 381 | Disposition: A | Discharge status: Home / Self Care | Payer: PRIVATE HEALTH INSURANCE | Attending: Internal Medicine | Admitting: Internal Medicine

## 2012-04-30 DIAGNOSIS — C3490 Malignant neoplasm of unspecified part of unspecified bronchus or lung: Secondary | ICD-10-CM | POA: Diagnosis present

## 2012-04-30 DIAGNOSIS — R1310 Dysphagia, unspecified: Secondary | ICD-10-CM

## 2012-04-30 DIAGNOSIS — F17200 Nicotine dependence, unspecified, uncomplicated: Secondary | ICD-10-CM | POA: Diagnosis present

## 2012-04-30 DIAGNOSIS — B009 Herpesviral infection, unspecified: Secondary | ICD-10-CM | POA: Diagnosis present

## 2012-04-30 DIAGNOSIS — C3410 Malignant neoplasm of upper lobe, unspecified bronchus or lung: Secondary | ICD-10-CM | POA: Diagnosis present

## 2012-04-30 DIAGNOSIS — R1319 Other dysphagia: Secondary | ICD-10-CM | POA: Diagnosis present

## 2012-04-30 DIAGNOSIS — C7931 Secondary malignant neoplasm of brain: Secondary | ICD-10-CM | POA: Diagnosis present

## 2012-04-30 DIAGNOSIS — K222 Esophageal obstruction: Secondary | ICD-10-CM

## 2012-04-30 DIAGNOSIS — D649 Anemia, unspecified: Secondary | ICD-10-CM | POA: Diagnosis present

## 2012-04-30 DIAGNOSIS — D696 Thrombocytopenia, unspecified: Secondary | ICD-10-CM | POA: Diagnosis present

## 2012-04-30 DIAGNOSIS — K2210 Ulcer of esophagus without bleeding: Principal | ICD-10-CM | POA: Diagnosis present

## 2012-04-30 DIAGNOSIS — Z923 Personal history of irradiation: Secondary | ICD-10-CM

## 2012-04-30 DIAGNOSIS — Z9221 Personal history of antineoplastic chemotherapy: Secondary | ICD-10-CM

## 2012-04-30 DIAGNOSIS — Z79899 Other long term (current) drug therapy: Secondary | ICD-10-CM

## 2012-04-30 DIAGNOSIS — Z881 Allergy status to other antibiotic agents status: Secondary | ICD-10-CM

## 2012-04-30 DIAGNOSIS — J4489 Other specified chronic obstructive pulmonary disease: Secondary | ICD-10-CM | POA: Diagnosis present

## 2012-04-30 DIAGNOSIS — Z90710 Acquired absence of both cervix and uterus: Secondary | ICD-10-CM

## 2012-04-30 DIAGNOSIS — J449 Chronic obstructive pulmonary disease, unspecified: Secondary | ICD-10-CM | POA: Diagnosis present

## 2012-04-30 DIAGNOSIS — K209 Esophagitis, unspecified without bleeding: Secondary | ICD-10-CM | POA: Diagnosis present

## 2012-04-30 DIAGNOSIS — C7949 Secondary malignant neoplasm of other parts of nervous system: Secondary | ICD-10-CM

## 2012-04-30 DIAGNOSIS — G939 Disorder of brain, unspecified: Secondary | ICD-10-CM | POA: Diagnosis present

## 2012-04-30 HISTORY — DX: Dysphagia, unspecified: R13.10

## 2012-04-30 LAB — CBC WITH DIFFERENTIAL/PLATELET
Basophils Relative: 0 % (ref 0–1)
Eosinophils Absolute: 0 10*3/uL (ref 0.0–0.7)
HCT: 33.8 % — ABNORMAL LOW (ref 36.0–46.0)
Hemoglobin: 11.2 g/dL — ABNORMAL LOW (ref 12.0–15.0)
Lymphs Abs: 0.7 10*3/uL (ref 0.7–4.0)
MCH: 33.4 pg (ref 26.0–34.0)
MCHC: 33.1 g/dL (ref 30.0–36.0)
Monocytes Absolute: 1.1 10*3/uL — ABNORMAL HIGH (ref 0.1–1.0)
Monocytes Relative: 11 % (ref 3–12)
Neutrophils Relative %: 82 % — ABNORMAL HIGH (ref 43–77)
RBC: 3.35 MIL/uL — ABNORMAL LOW (ref 3.87–5.11)

## 2012-04-30 LAB — COMPREHENSIVE METABOLIC PANEL
ALT: 17 U/L (ref 0–35)
AST: 21 U/L (ref 0–37)
Albumin: 3.6 g/dL (ref 3.5–5.2)
Calcium: 9.7 mg/dL (ref 8.4–10.5)
Creatinine, Ser: 0.61 mg/dL (ref 0.50–1.10)
Sodium: 132 mEq/L — ABNORMAL LOW (ref 135–145)
Total Protein: 6.9 g/dL (ref 6.0–8.3)

## 2012-04-30 MED ORDER — MORPHINE SULFATE 2 MG/ML IJ SOLN
1.0000 mg | INTRAMUSCULAR | Status: DC | PRN
  Administered 2012-05-02 – 2012-05-04 (×3): 1 mg via INTRAVENOUS
  Filled 2012-04-30 (×3): qty 1

## 2012-04-30 MED ORDER — SODIUM CHLORIDE 0.9 % IV BOLUS (SEPSIS)
1000.0000 mL | Freq: Once | INTRAVENOUS | Status: AC
  Administered 2012-04-30: 1000 mL via INTRAVENOUS

## 2012-04-30 MED ORDER — GI COCKTAIL ~~LOC~~
30.0000 mL | Freq: Once | ORAL | Status: AC
  Administered 2012-04-30: 30 mL via ORAL
  Filled 2012-04-30: qty 30

## 2012-04-30 MED ORDER — GLUCAGON HCL (RDNA) 1 MG IJ SOLR
1.0000 mg | Freq: Once | INTRAMUSCULAR | Status: AC
  Administered 2012-04-30: 1 mg via INTRAVENOUS
  Filled 2012-04-30: qty 1

## 2012-04-30 MED ORDER — ALBUTEROL SULFATE (5 MG/ML) 0.5% IN NEBU
2.5000 mg | INHALATION_SOLUTION | RESPIRATORY_TRACT | Status: DC | PRN
  Administered 2012-05-01 – 2012-05-02 (×2): 2.5 mg via RESPIRATORY_TRACT
  Filled 2012-04-30 (×2): qty 0.5

## 2012-04-30 MED ORDER — FLUCONAZOLE IN SODIUM CHLORIDE 200-0.9 MG/100ML-% IV SOLN
200.0000 mg | INTRAVENOUS | Status: DC
  Administered 2012-04-30 – 2012-05-05 (×6): 200 mg via INTRAVENOUS
  Filled 2012-04-30 (×7): qty 100

## 2012-04-30 MED ORDER — DEXTROSE-NACL 5-0.9 % IV SOLN
INTRAVENOUS | Status: DC
  Administered 2012-04-30: 75 mL/h via INTRAVENOUS
  Administered 2012-05-01 – 2012-05-02 (×2): via INTRAVENOUS
  Administered 2012-05-03: 1000 mL via INTRAVENOUS
  Administered 2012-05-04 – 2012-05-05 (×2): via INTRAVENOUS

## 2012-04-30 MED ORDER — MORPHINE SULFATE 4 MG/ML IJ SOLN
4.0000 mg | Freq: Once | INTRAMUSCULAR | Status: AC
  Administered 2012-04-30: 4 mg via INTRAVENOUS
  Filled 2012-04-30: qty 1

## 2012-04-30 MED ORDER — GI COCKTAIL ~~LOC~~
30.0000 mL | Freq: Two times a day (BID) | ORAL | Status: DC
  Administered 2012-05-01 – 2012-05-03 (×5): 30 mL via ORAL
  Filled 2012-04-30 (×6): qty 30

## 2012-04-30 MED ORDER — IPRATROPIUM BROMIDE 0.02 % IN SOLN
0.5000 mg | RESPIRATORY_TRACT | Status: DC | PRN
  Administered 2012-05-01 – 2012-05-02 (×2): 0.5 mg via RESPIRATORY_TRACT
  Filled 2012-04-30 (×2): qty 2.5

## 2012-04-30 MED ORDER — PANTOPRAZOLE SODIUM 40 MG IV SOLR
40.0000 mg | Freq: Two times a day (BID) | INTRAVENOUS | Status: DC
  Administered 2012-04-30 – 2012-05-05 (×10): 40 mg via INTRAVENOUS
  Filled 2012-04-30 (×11): qty 40

## 2012-04-30 MED ORDER — ONDANSETRON HCL 4 MG/2ML IJ SOLN: 4.0000 mg | Freq: Four times a day (QID) | INTRAMUSCULAR | Status: DC | PRN

## 2012-04-30 MED ORDER — DEXAMETHASONE SODIUM PHOSPHATE 4 MG/ML IJ SOLN
4.0000 mg | Freq: Two times a day (BID) | INTRAMUSCULAR | Status: DC
  Administered 2012-04-30 – 2012-05-06 (×12): 4 mg via INTRAVENOUS
  Filled 2012-04-30 (×14): qty 1

## 2012-04-30 NOTE — ED Notes (Signed)
Per md request, pt given PO fluids to drink. Pt took 2 sips and reports hurts to swallow. Able to swallow, but with difficulty

## 2012-04-30 NOTE — ED Notes (Signed)
Pt to xray

## 2012-04-30 NOTE — ED Notes (Signed)
hospitalist at bedside

## 2012-04-30 NOTE — ED Provider Notes (Signed)
I saw and evaluated the patient, reviewed the resident's note and I agree with the findings and plan.  Hx lung cancer with difficulty and pain with swallowing x 1 day. Seen recently for thrush and given diflucan but this is different. No vomiting, drooling. No abdominal pain. Speaking in full sentences, no distress, no drooling, oropharynx clear.   Glynn Octave, MD 04/30/12 [phone]

## 2012-04-30 NOTE — Consult Note (Signed)
Referring Provider: Dr. Manus Gunning Primary Care Physician:  [name], MD Primary Gastroenterologist:  Gentry Fitz  Reason for Consultation:  Dysphagia  HPI: Melanie Liu is a 62 y.o. female with metastatic lung cancer to the brain on chemotherapy who completed radiation therapy in December had the acute onset of feeling like food hung up near the end of eating a [place] sandwich last night. She had a persistent sensation and did not try to eat anything after that happened. She tried to drink some water in the ER during a trial and could not swallow it. She saw her oncologist on 04/28/12 and was given Diflucan for complaint of soreness in her mouth. Since this episode last night she feels like she is choking and she had pain in her throat. She has been afraid to try anything prior to coming into the hospital. Denies vomiting or hematemesis. Denies any trouble eating in January and denies any trouble with liquids. She had issues with swallowing around the time of finishing radiation in December but states that it cleared up. After the GI cocktail in the ER she felt a lot better. On an esophagram a tablet hung up in the proximal esophagus concerning for a proximal esophageal stricture.     Past Medical History  Diagnosis Date  . Nodule of right lung     right upper lobe  . Anemia   . Rectovaginal fistula   . Lung cancer 11/23/11    biopsy-right  . Right frontal lobe lesion 12/13/11    CT head  . History of radiation therapy 12/24/11-02/08/12    rul 66Gy/74fxs    Past Surgical History  Procedure Laterality Date  . Partial hysterectomy    . Hemmorhoid surgery    . Neck surgery    . Retinal detachment surgery      Prior to Admission medications   Medication Sig Start Date End Date Taking? Authorizing Provider  albuterol (PROVENTIL) (5 MG/ML) 0.5% nebulizer solution Take 0.5 mLs (2.5 mg total) by nebulization every 4 (four) hours as needed for wheezing or shortness of breath.  04/15/12  Yes Alison Murray, MD  calcium-vitamin D (OSCAL WITH D) 500-200 MG-UNIT per tablet Take 1 tablet by mouth daily.   Yes Historical Provider, MD  chlorpheniramine-HYDROcodone (TUSSIONEX PENNKINETIC ER) 10-8 MG/5ML LQCR Take 5 mLs by mouth every 12 (twelve) hours as needed. Called in by MD to [name] ,[place]. [phone] 04/04/12  Yes [name], MD  dexamethasone (DECADRON) 4 MG tablet Take 4 mg by mouth 2 (two) times daily. 03/10/12  Yes [name], MD  emollient (BIAFINE) cream Apply topically 2 (two) times daily.   Yes Historical Provider, MD  fluconazole (DIFLUCAN) 100 MG tablet Take 100 mg by mouth daily. Started on 04-28-12 for 7 day therapy 04/28/12  Yes Conni Slipper, PA  folic acid (FOLVITE) 1 MG tablet Take 1 tablet (1 mg total) by mouth daily. 03/10/12  Yes [name], MD  glucosamine-chondroitin 500-400 MG tablet Take 1 tablet by mouth daily.    Yes Historical Provider, MD  guaiFENesin (ROBITUSSIN) 100 MG/5ML SOLN Take 20 mLs (400 mg total) by mouth every 6 (six) hours as needed. 04/15/12  Yes Alison Murray, MD  ipratropium (ATROVENT) 0.02 % nebulizer solution Take 0.5 mg by nebulization every 4 (four) hours as needed (pt mixes with albuterol). 04/15/12  Yes Alison Murray, MD  oxyCODONE-acetaminophen (PERCOCET/ROXICET) 5-325 MG per tablet Take 1-2 tablets by mouth every 4 (four) hours as needed for pain.  04/15/12  Yes Alison Murray, MD  polyethylene glycol [facility] Ethelene Hal) packet Take 17 g by mouth daily. 04/15/12  Yes Alison Murray, MD  PRESCRIPTION MEDICATION Place into the left eye every 30 (thirty) days. Bevacizumab (Avastin) inj into the left eye   Yes Historical Provider, MD  PRESCRIPTION MEDICATION [facility]. Last treatment was on 04-10-12. Pt's on an every 3 week cycle. Pt was supposed to have a chemo treatment on Monday  04-28-12 but that treatment was cancelled. Followed by Dr Legrand Como.   Yes Historical Provider, MD  sucralfate (CARAFATE) 1 G  tablet Take 1 g by mouth 3 (three) times a week. 5 min before food 01/04/12  Yes [name], MD  vitamin E 400 UNIT capsule Take 400 Units by mouth daily.   Yes Historical Provider, MD    Scheduled Meds: . dexamethasone  4 mg Intravenous Q12H  . fluconazole (DIFLUCAN) IV  200 mg Intravenous Q24H  . pantoprazole (PROTONIX) IV  40 mg Intravenous Q12H   Continuous Infusions: . dextrose 5 % and 0.9% NaCl     PRN Meds:.albuterol, ipratropium, morphine injection, ondansetron (ZOFRAN) IV  Allergies as of 04/30/2012 - Review Complete 04/30/2012  Allergen Reaction Noted  . Levaquin (levofloxacin in d5w) Rash 11/14/2011    Family History  Problem Relation Age of Onset  . Heart disease Father   . Kidney disease Mother   . Alzheimer's disease Sister   . Alzheimer's disease Brother     History   Social History  . Marital Status: Single    Spouse Name: N/A    Number of Children: 2  . Years of Education: N/A   Occupational History  .     Social History Main Topics  . Smoking status: Current Every Day Smoker -- 1.00 packs/day for 43 years    Types: Cigarettes  . Smokeless tobacco: Never Used  . Alcohol Use: No  . Drug Use: No  . Sexually Active: No   Other Topics Concern  . Not on file   Social History Narrative  . No narrative on file    Review of Systems: All negative except as stated above in HPI.  Physical Exam: Vital signs: Filed Vitals:   04/30/12 1747  BP: 118/84  Pulse: 87  Temp: 97.6 F (36.4 C)  Resp: 18     General:   Thin, Alert, pleasant and cooperative in NAD HEENT: anicteric Neck: supple, nontender Lungs:  Clear throughout to auscultation.   No wheezes, crackles, or rhonchi. No acute distress. Heart:  Regular rate and rhythm; no murmurs, clicks, rubs,  or gallops. Abdomen: soft, nontender, nondistended, +BS  Rectal:  Deferred Ext: no edema  GI:  Lab Results:  Recent Labs  04/28/12 1338 04/30/12 1240  WBC 11.0* 10.2  HGB 11.1*  11.2*  HCT 33.8* 33.8*  PLT 86* 87*   BMET  Recent Labs  04/30/12 1240  NA 132*  K 4.0  CL 95*  CO2 23  GLUCOSE 91  BUN 26*  CREATININE 0.61  CALCIUM 9.7   LFT  Recent Labs  04/30/12 1240  PROT 6.9  ALBUMIN 3.6  AST 21  ALT 17  ALKPHOS 76  BILITOT 0.4   PT/INR No results found for this basename: LABPROT, INR,  in the last 72 hours   Studies/Results: Dg Chest 2 View  04/30/2012  *RADIOLOGY REPORT*  Clinical Data: Difficulty swallowing.  Lung cancer.  CHEST - 2 VIEW  Comparison: CT chest and  chest radiograph 04/10/2012.  Findings: Trachea is midline.  Heart size normal.  Right upper lobe mass again noted.  Lungs are emphysematous.  No pleural fluid.  IMPRESSION: Right upper lobe mass.  No superimposed acute findings.   Original Report Authenticated By: Leanna Battles, M.D.    Dg Esophagus  04/30/2012  *RADIOLOGY REPORT*  Clinical Data: Difficulty swallowing.  History of lung cancer and radiation therapy.  ESOPHOGRAM/BARIUM SWALLOW  Technique:  Single contrast examination was performed using thin barium.  Fluoroscopy time:  1.11 minutes.  Comparison:  Plain films of the chest this same day and CT chest 04/10/2012.  Findings:  A double contrast study was initially attempted but the patient could not tolerate the study.  Therefore, a single contrast exam was performed.  No evidence inflammatory process or mass is seen in the esophagus on this limited exam.  A 13 mm barium time was given to the patient became lodged in the upper esophagus at the level of the clavicular heads.  IMPRESSION: Findings compatible with proximal esophageal stricture.   Original Report Authenticated By: Holley Dexter, M.D.     Impression/Plan: 62 yo woman with metastatic lung cancer presenting with the acute onset of dysphagia while eating a [place] sandwich. Doubt a stricture is present based on her history. If she has been eating well for the last 7-8 weeks as she attests until this week and is on  chemotherapy, my first concern would be Candida esophagitis and/or oropharyngeal Candidiasis. Continue Diflucan but will change to IV. Will do conservative management with GI cocktail prn and sips of clears in AM. If tolerates then advance diet slowly. If she does not tolerate this trial, then may need an EGD to further assess.    LOS: 0 days   Amrit Cress C.  04/30/2012, 6:50 PM

## 2012-04-30 NOTE — ED Notes (Addendum)
Pt alert and oriented x4. Respirations even and unlabored, bilateral symmetrical rise and fall of chest. Skin warm and dry. In no acute distress. Denies needs.  md at bedside 

## 2012-04-30 NOTE — ED Notes (Signed)
Pt reports difficulty swallowing, started last night, pain 5/10. "cant get anything down". Reports dx with thrush in mouth on 2/24, has not taken rx today due to difficulty swallowing. Denies n/v/d.

## 2012-04-30 NOTE — ED Notes (Signed)
Report given to little, rn

## 2012-04-30 NOTE — H&P (Signed)
[facility] History and Physical  Melanie Liu RUE:[id number] DOB: [birth year] DOA: 04/30/2012  Referring physician: ED PCP: [name], MD   Chief Complaint:  Dysphasia since 1 day  HPI:  62 year old female with history  right upper lobe lung cancer with right frontal lobe lesion currently on chemotherapy , radiation therapy received in December 2013, anemia, COPD with recent exacerbation who presents to the ED with dysphasia since last evening. Patient was unable to swallow anything since last evening. Denied odynophagia, nausea, vomiting, fever, chills, abdominal pain, chest pain, palpitations, shortness of breath, bowel or urinary symptoms. Sudafed to eat or drink anything since last evening. In the ED barium swallow was done showing proximal esophageal stricture. [facility] was consulted and [facility] called for admission. Patient was recently started on Diflucan for oral thrush.  Review of Systems:  Constitutional:appetite change+,  Denies fever, chills, diaphoresis,  and fatigue.  HEENT: trouble swallowing+, Denies photophobia, eye pain, redness, hearing loss, ear pain, congestion, sore throat, rhinorrhea, sneezing, mouth sores, , neck pain, neck stiffness and tinnitus.   Respiratory: Denies SOB, DOE, cough, chest tightness,  and wheezing.   Cardiovascular: Denies chest pain, palpitations and leg swelling.  Gastrointestinal: Dysphagia+,  Denies nausea, vomiting, abdominal pain, diarrhea, constipation, blood in stool and abdominal distention.  Genitourinary: Denies dysuria, urgency, frequency, hematuria, flank pain and difficulty urinating.  Musculoskeletal: Denies myalgias, back pain, joint swelling, arthralgias and gait problem.  Skin: Denies pallor, rash and wound.  Neurological: Denies dizziness, seizures, syncope, weakness, light-headedness, numbness and headaches.  Hematological: Denies adenopathy. Easy bruising, personal or family bleeding history   Psychiatric/Behavioral: Denies suicidal ideation, mood changes, confusion, nervousness, sleep disturbance and agitation   Past Medical History  Diagnosis Date  . Nodule of right lung     right upper lobe  . Anemia   . Rectovaginal fistula   . Lung cancer 11/23/11    biopsy-right  . Right frontal lobe lesion 12/13/11    CT head  . History of radiation therapy 12/24/11-02/08/12    rul 66Gy/43fxs   Past Surgical History  Procedure Laterality Date  . Partial hysterectomy    . Hemmorhoid surgery    . Neck surgery    . Retinal detachment surgery     Social History:  reports that she has been smoking Cigarettes.  She has a 43 pack-year smoking history. She has never used smokeless tobacco. She reports that she does not drink alcohol or use illicit drugs.  Allergies  Allergen Reactions  . Levaquin (Levofloxacin In D5w) Rash    itching    Family History  Problem Relation Age of Onset  . Heart disease Father   . Kidney disease Mother   . Alzheimer's disease Sister   . Alzheimer's disease Brother     Prior to Admission medications   Medication Sig Start Date End Date Taking? Authorizing Provider  albuterol (PROVENTIL) (5 MG/ML) 0.5% nebulizer solution Take 0.5 mLs (2.5 mg total) by nebulization every 4 (four) hours as needed for wheezing or shortness of breath. 04/15/12  Yes Alison Murray, MD  calcium-vitamin D (OSCAL WITH D) 500-200 MG-UNIT per tablet Take 1 tablet by mouth daily.   Yes Historical Provider, MD  chlorpheniramine-HYDROcodone (TUSSIONEX PENNKINETIC ER) 10-8 MG/5ML LQCR Take 5 mLs by mouth every 12 (twelve) hours as needed. Called in by MD to [name] ,[place]. [phone] 04/04/12  Yes [name], MD  dexamethasone (DECADRON) 4 MG tablet Take 4 mg by mouth 2 (two) times daily. 03/10/12  Yes [name], MD  emollient (BIAFINE) cream Apply topically 2 (two) times daily.   Yes Historical Provider, MD  fluconazole (DIFLUCAN) 100 MG tablet Take 100 mg by mouth  daily. Started on 04-28-12 for 7 day therapy 04/28/12  Yes Conni Slipper, PA  folic acid (FOLVITE) 1 MG tablet Take 1 tablet (1 mg total) by mouth daily. 03/10/12  Yes [name], MD  glucosamine-chondroitin 500-400 MG tablet Take 1 tablet by mouth daily.    Yes Historical Provider, MD  guaiFENesin (ROBITUSSIN) 100 MG/5ML SOLN Take 20 mLs (400 mg total) by mouth every 6 (six) hours as needed. 04/15/12  Yes Alison Murray, MD  ipratropium (ATROVENT) 0.02 % nebulizer solution Take 0.5 mg by nebulization every 4 (four) hours as needed (pt mixes with albuterol). 04/15/12  Yes Alison Murray, MD  oxyCODONE-acetaminophen (PERCOCET/ROXICET) 5-325 MG per tablet Take 1-2 tablets by mouth every 4 (four) hours as needed for pain. 04/15/12  Yes Alison Murray, MD  polyethylene glycol [facility] / Ethelene Hal) packet Take 17 g by mouth daily. 04/15/12  Yes Alison Murray, MD  PRESCRIPTION MEDICATION Place into the left eye every 30 (thirty) days. Bevacizumab (Avastin) inj into the left eye   Yes Historical Provider, MD  PRESCRIPTION MEDICATION [facility]. Last treatment was on 04-10-12. Pt's on an every 3 week cycle. Pt was supposed to have a chemo treatment on Monday  04-28-12 but that treatment was cancelled. Followed by Dr Legrand Como.   Yes Historical Provider, MD  sucralfate (CARAFATE) 1 G tablet Take 1 g by mouth 3 (three) times a week. 5 min before food 01/04/12  Yes [name], MD  vitamin E 400 UNIT capsule Take 400 Units by mouth daily.   Yes Historical Provider, MD    Physical Exam:  Filed Vitals:   04/30/12 1325 04/30/12 1453 04/30/12 1519 04/30/12 1630  BP:   124/86 119/84  Pulse: 91 87  78  Temp:      TempSrc:      Resp: 16     SpO2: 96% 97%  93%    Constitutional: Vital signs reviewed.  Patient is a thin built female in no acute distress and cooperative with exam. Alert and oriented x3.  Head: Normocephalic and atraumatic Ear: TM normal bilaterally Mouth: no erythema or exudates,  MMM Eyes: PERRL, EOMI, conjunctivae normal, No scleral icterus.  Neck: Supple, Trachea midline normal ROM, No JVD, mass, thyromegaly, or carotid bruit present.  Cardiovascular: RRR, S1 normal, S2 normal, no MRG, pulses symmetric and intact bilaterally Pulmonary/Chest: CTAB, no wheezes, rales, or rhonchi Abdominal: Soft. Non-tender, non-distended, bowel sounds are normal, no masses, organomegaly, or guarding present.  GU: no CVA tenderness Musculoskeletal: No joint deformities, erythema, or stiffness, ROM full and no nontender Ext: no edema and no cyanosis, pulses palpable bilaterally (DP and PT) Hematology: no cervical, inginal, or axillary adenopathy.  Neurological: A&O x3, Strenght is normal and symmetric bilaterally, cranial nerve II-XII are grossly intact, no focal motor deficit, sensory intact to light touch bilaterally.  Skin: Warm, dry and intact. No rash, cyanosis, or clubbing.  Psychiatric: Normal mood and affect. speech and behavior is normal. Judgment and thought content normal. Cognition and memory are normal.   Labs on Admission:  Basic Metabolic Panel:  Recent Labs Lab 04/30/12 1240  NA 132*  K 4.0  CL 95*  CO2 23  GLUCOSE 91  BUN 26*  CREATININE 0.61  CALCIUM 9.7   Liver Function Tests:  Recent Labs Lab 04/30/12 1240  AST 21  ALT 17  ALKPHOS 76  BILITOT 0.4  PROT 6.9  ALBUMIN 3.6   No results found for this basename: LIPASE, AMYLASE,  in the last 168 hours No results found for this basename: AMMONIA,  in the last 168 hours CBC:  Recent Labs Lab 04/28/12 1338 04/30/12 1240  WBC 11.0* 10.2  NEUTROABS 9.5* 8.4*  HGB 11.1* 11.2*  HCT 33.8* 33.8*  MCV 100.6 100.9*  PLT 86* 87*   Cardiac Enzymes: No results found for this basename: CKTOTAL, CKMB, CKMBINDEX, TROPONINI,  in the last 168 hours BNP: No components found with this basename: POCBNP,  CBG: No results found for this basename: GLUCAP,  in the last 168 hours  Radiological Exams on  Admission: Dg Chest 2 View  04/30/2012  *RADIOLOGY REPORT*  Clinical Data: Difficulty swallowing.  Lung cancer.  CHEST - 2 VIEW  Comparison: CT chest and chest radiograph 04/10/2012.  Findings: Trachea is midline.  Heart size normal.  Right upper lobe mass again noted.  Lungs are emphysematous.  No pleural fluid.  IMPRESSION: Right upper lobe mass.  No superimposed acute findings.   Original Report Authenticated By: Leanna Battles, M.D.    Dg Esophagus  04/30/2012  *RADIOLOGY REPORT*  Clinical Data: Difficulty swallowing.  History of lung cancer and radiation therapy.  ESOPHOGRAM/BARIUM SWALLOW  Technique:  Single contrast examination was performed using thin barium.  Fluoroscopy time:  1.11 minutes.  Comparison:  Plain films of the chest this same day and CT chest 04/10/2012.  Findings:  A double contrast study was initially attempted but the patient could not tolerate the study.  Therefore, a single contrast exam was performed.  No evidence inflammatory process or mass is seen in the esophagus on this limited exam.  A 13 mm barium time was given to the patient became lodged in the upper esophagus at the level of the clavicular heads.  IMPRESSION: Findings compatible with proximal esophageal stricture.   Original Report Authenticated By: Holley Dexter, M.D.       Assessment/Plan  Principal Problem:   Esophageal stricture  possibly in the setting of chemotherapy, previous radiation therapy Admit patient to medical floor. Keep patient n.p.o. and hydrate with D5 normal saline. IV Protonix twice a day. IV Reglan every 6 hours as needed [facility] consulted. Will evaluate patient Will switch the pain medication to IV morphine when necessary Recently placed on Diflucan for oral thrush. Will switch to IV.  Active Problems:   Non-small cell Lung cancer with right frontal lobe lesion Status post radiation therapy in December and currently getting chemotherapy. Her recent chemotherapy was postponed  given low platelets Follows with [name] whom I will notify. Switch Decadron to IV (on 4 mg by mouth twice a day)    Thrombocytopenia, unspecified Noted on last hospitalization. Platelet currently stable at low level. Hold pharmacological DVT prophylaxis    COPD (chronic obstructive pulmonary disease) Stable. Was admitted recently for exacerbation. Continue when necessary oxygen and nebs.  Anemia Stable  DVT prophylaxis: SCD  Code Status: Full code Family Communication: Sister at bedside Disposition Plan: Home once stable  Eddie North [facility] Pager [phone]  If 7PM-7AM, please contact night-coverage [email or web address] Password [facility] 04/30/2012, 5:05 PM   Total time spent in admission: 70 minutes

## 2012-04-30 NOTE — ED Provider Notes (Signed)
History     CSN: [id number]  Arrival date & time 04/30/12  1210  First MD Initiated Contact with Patient 04/30/12 1248      Chief Complaint  Patient presents with  . difficulty swallowing   . 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  She saw her oncologist 2/24 and complained of her mouth being being sore she was given Diflucan for thrush but has only been able to take 2 days worth.  Yesterday while eating dinner she noticed she had difficulty swallowing and if was painful like her food did not go down completely.  Then sensation is still there pain is 5/10.  She feels like she is "choking".  She has not tried anything.  She has not had anything to eat or drink because she is afraid.  Her mouth is still sore   Other PMH: anemia, rectovaginal fistula  The history is provided by the patient. No language interpreter was used.    Past Medical History  Diagnosis Date  . Nodule of right lung     right upper lobe  . Anemia   . Rectovaginal fistula   . Lung cancer 11/23/11    biopsy-right  . Right frontal lobe lesion 12/13/11    CT head  . History of radiation therapy 12/24/11-02/08/12    rul 66Gy/80fxs    Past Surgical History  Procedure Laterality Date  . Partial hysterectomy    . Hemmorhoid surgery    . Neck surgery    . Retinal detachment surgery      Family History  Problem Relation Age of Onset  . Heart disease Father   . Kidney disease Mother   . Alzheimer's disease Sister   . Alzheimer's disease Brother     History  Substance Use Topics  . Smoking status: Current Every Day Smoker -- 1.00 packs/day for 43 years    Types: Cigarettes  . Smokeless tobacco: Never Used  . Alcohol Use: No    OB History   Grav Para Term Preterm Abortions TAB SAB Ect Mult Living                  Review of Systems  Constitutional: Negative for  fever and chills.  HENT: Positive for trouble swallowing. Negative for drooling and voice change.   Gastrointestinal: Negative for nausea, vomiting and abdominal pain.  Genitourinary: Negative for difficulty urinating.  All other systems reviewed and are negative.    Allergies  Levaquin  Home Medications   Current Outpatient Rx  Name  Route  Sig  Dispense  Refill  . albuterol (PROVENTIL) (5 MG/ML) 0.5% nebulizer solution   Nebulization   Take 0.5 mLs (2.5 mg total) by nebulization every 4 (four) hours as needed for wheezing or shortness of breath.   20 mL   3   . calcium-vitamin D (OSCAL WITH D) 500-200 MG-UNIT per tablet   Oral   Take 1 tablet by mouth daily.         . chlorpheniramine-HYDROcodone (TUSSIONEX PENNKINETIC ER) 10-8 MG/5ML LQCR   Oral   Take 5 mLs by mouth every 12 (twelve) hours as needed. [place] [phone]         . dexamethasone (DECADRON) 4 MG tablet   Oral   Take 4 mg by mouth 2 (two) times daily.         [name]  emollient (BIAFINE) cream   Topical   Apply topically 2 (two) times daily.         . fluconazole (DIFLUCAN) 100 MG tablet   Oral   Take 100 mg by mouth daily. Started on 04-28-12 for 7 day therapy         . folic acid (FOLVITE) 1 MG tablet   Oral   Take 1 tablet (1 mg total) by mouth daily.   30 tablet   5   . glucosamine-chondroitin 500-400 MG tablet   Oral   Take 1 tablet by mouth daily.          [name] guaiFENesin (ROBITUSSIN) 100 MG/5ML SOLN   Oral   Take 20 mLs (400 mg total) by mouth every 6 (six) hours as needed.   1200 mL   0   . ipratropium (ATROVENT) 0.02 % nebulizer solution   Nebulization   Take 0.5 mg by nebulization every 4 (four) hours as needed (pt mixes with albuterol).         [name] oxyCODONE-acetaminophen (PERCOCET/ROXICET) 5-325 MG per tablet   Oral   Take 1-2 tablets by mouth every 4 (four) hours as needed for pain.   120 tablet   0   . polyethylene glycol (MIRALAX /  GLYCOLAX) packet   Oral   Take 17 g by mouth daily.   14 each   3   . PRESCRIPTION MEDICATION   Left Eye   Place into the left eye every 30 (thirty) days. Bevacizumab (Avastin) inj into the left eye         . PRESCRIPTION MEDICATION      [facility]. Last treatment was on 04-10-12. Pt's on an every 3 week cycle. Pt was supposed to have a chemo treatment on Monday  04-28-12 but that treatment was cancelled. Followed by Dr Legrand Como.         . sucralfate (CARAFATE) 1 G tablet   Oral   Take 1 g by mouth 3 (three) times a week. 5 min before food         . vitamin E 400 UNIT capsule   Oral   Take 400 Units by mouth daily.           BP 124/86  Pulse 87  Temp(Src) 97.3 F (36.3 C) (Oral)  Resp 16  SpO2 97%  Physical Exam  Nursing note and vitals reviewed. Constitutional: She is oriented to person, place, and time. She appears cachectic. She is cooperative. No distress.  HENT:  Head: Normocephalic and atraumatic.  Mouth/Throat: Oropharynx is clear and moist and mucous membranes are normal. No oropharyngeal exudate.  Tongue with erythema no evidence of thrush Throat no exudate somewhat erythematous  No gross objects/food in throat from gross inspection  Eyes: Conjunctivae are normal. Pupils are equal, round, and reactive to light. Right eye exhibits no discharge. Left eye exhibits no discharge. No scleral icterus.  Cardiovascular: Regular rhythm, S1 normal, S2 normal and normal heart sounds.  Tachycardia present.   No murmur heard. Pulmonary/Chest: Effort normal and breath sounds normal. No stridor. No respiratory distress. She has no wheezes.  Abdominal: Soft. Bowel sounds are normal. There is no tenderness.  Neurological: She is alert and oriented to person, place, and time.  Skin: Skin is warm, dry and intact. No rash noted. She is not diaphoretic.  Psychiatric: She has a normal mood and affect. Her speech is normal and behavior is normal. Judgment and thought  content normal. Cognition and  memory are normal.    ED Course  Procedures (including critical care time)  Labs Reviewed  COMPREHENSIVE METABOLIC PANEL - Abnormal; Notable for the following:    Sodium 132 (*)    Chloride 95 (*)    BUN 26 (*)    All other components within normal limits  CBC WITH DIFFERENTIAL - Abnormal; Notable for the following:    RBC 3.35 (*)    Hemoglobin 11.2 (*)    HCT 33.8 (*)    MCV 100.9 (*)    RDW 20.5 (*)    Platelets 87 (*)    Neutrophils Relative 82 (*)    Neutro Abs 8.4 (*)    Lymphocytes Relative 6 (*)    Monocytes Absolute 1.1 (*)    All other components within normal limits   Dg Chest 2 View  04/30/2012  *RADIOLOGY REPORT*  Clinical Data: Difficulty swallowing.  Lung cancer.  CHEST - 2 VIEW  Comparison: CT chest and chest radiograph 04/10/2012.  Findings: Trachea is midline.  Heart size normal.  Right upper lobe mass again noted.  Lungs are emphysematous.  No pleural fluid.  IMPRESSION: Right upper lobe mass.  No superimposed acute findings.   Original Report Authenticated By: Leanna Battles, M.D.    Dg Esophagus  04/30/2012  *RADIOLOGY REPORT*  Clinical Data: Difficulty swallowing.  History of lung cancer and radiation therapy.  ESOPHOGRAM/BARIUM SWALLOW  Technique:  Single contrast examination was performed using thin barium.  Fluoroscopy time:  1.11 minutes.  Comparison:  Plain films of the chest this same day and CT chest 04/10/2012.  Findings:  A double contrast study was initially attempted but the patient could not tolerate the study.  Therefore, a single contrast exam was performed.  No evidence inflammatory process or mass is seen in the esophagus on this limited exam.  A 13 mm barium time was given to the patient became lodged in the upper esophagus at the level of the clavicular heads.  IMPRESSION: Findings compatible with proximal esophageal stricture.   Original Report Authenticated By: Holley Dexter, M.D.      1. Dysphagia   2.  Odynophagia   3. Esophageal stricture       MDM  Ddx: food impaction, esophagitis, stricture/web/ring, met cancer, effects of radiation, Chemotherapeutic agents can cause mucositis.  Paclitaxel can cause GI obstruction/perforation CMET, cbc, CXR Called Dr. Bosie Clos to disc.  He rec try water. If can tolerate water consider barium swallow and further w/u  Admit to hospitalist for GI (Dr. Bosie Clos) to be consulted for further management   Shirlee Latch MD [phone]      Annett Gula, MD 04/30/12 1406  Annett Gula, MD 04/30/12 1542  Annett Gula, MD 04/30/12 1542  Annett Gula, MD 04/30/12 [phone]

## 2012-05-01 ENCOUNTER — Encounter (HOSPITAL_COMMUNITY): Payer: Self-pay | Admitting: *Deleted

## 2012-05-01 ENCOUNTER — Encounter (HOSPITAL_COMMUNITY)
Admission: EM | Disposition: A | Discharge status: Home / Self Care | Payer: Self-pay | Source: Home / Self Care | Attending: Internal Medicine

## 2012-05-01 DIAGNOSIS — C3490 Malignant neoplasm of unspecified part of unspecified bronchus or lung: Secondary | ICD-10-CM

## 2012-05-01 DIAGNOSIS — R1310 Dysphagia, unspecified: Secondary | ICD-10-CM | POA: Diagnosis present

## 2012-05-01 HISTORY — PX: ESOPHAGOGASTRODUODENOSCOPY: SHX5428

## 2012-05-01 LAB — BASIC METABOLIC PANEL
CO2: 23 mEq/L (ref 19–32)
Chloride: 99 mEq/L (ref 96–112)
Glucose, Bld: 123 mg/dL — ABNORMAL HIGH (ref 70–99)
Sodium: 133 mEq/L — ABNORMAL LOW (ref 135–145)

## 2012-05-01 LAB — CBC
Hemoglobin: 10.6 g/dL — ABNORMAL LOW (ref 12.0–15.0)
Platelets: 77 10*3/uL — ABNORMAL LOW (ref 150–400)
RBC: 3.09 MIL/uL — ABNORMAL LOW (ref 3.87–5.11)
WBC: 7 10*3/uL (ref 4.0–10.5)

## 2012-05-01 SURGERY — EGD (ESOPHAGOGASTRODUODENOSCOPY)
Anesthesia: Moderate Sedation

## 2012-05-01 MED ORDER — SUCRALFATE 1 GM/10ML PO SUSP
1.0000 g | Freq: Three times a day (TID) | ORAL | Status: DC
  Administered 2012-05-01 – 2012-05-06 (×19): 1 g via ORAL
  Filled 2012-05-01 (×23): qty 10

## 2012-05-01 MED ORDER — FENTANYL CITRATE 0.05 MG/ML IJ SOLN
INTRAMUSCULAR | Status: DC | PRN
  Administered 2012-05-01: 25 ug via INTRAVENOUS

## 2012-05-01 MED ORDER — SODIUM CHLORIDE 0.9 % IV SOLN: INTRAVENOUS | Status: DC

## 2012-05-01 MED ORDER — MIDAZOLAM HCL 10 MG/2ML IJ SOLN
INTRAMUSCULAR | Status: DC | PRN
  Administered 2012-05-01: 1 mg via INTRAVENOUS
  Administered 2012-05-01: 2 mg via INTRAVENOUS

## 2012-05-01 MED ORDER — FENTANYL CITRATE 0.05 MG/ML IJ SOLN
INTRAMUSCULAR | Status: DC | PRN
  Administered 2012-05-01: 12.5 ug via INTRAVENOUS

## 2012-05-01 MED ORDER — BUTAMBEN-TETRACAINE-BENZOCAINE 2-2-14 % EX AERO
INHALATION_SPRAY | CUTANEOUS | Status: DC | PRN
  Administered 2012-05-01: 2 via TOPICAL

## 2012-05-01 NOTE — Interval H&P Note (Signed)
History and Physical Interval Note:  05/01/2012 4:50 PM  Melanie Liu  has presented today for surgery, with the diagnosis of dysphagia  The various methods of treatment have been discussed with the patient and family. After consideration of risks, benefits and other options for treatment, the patient has consented to  Procedure(s): ESOPHAGOGASTRODUODENOSCOPY (EGD) (N/A) as a surgical intervention .  The patient's history has been reviewed, patient examined, no change in status, stable for surgery.  I have reviewed the patient's chart and labs.  Questions were answered to the patient's satisfaction.     [name]

## 2012-05-01 NOTE — Brief Op Note (Signed)
Malignant-appearing proximal esophageal stricture with mild narrowing. See endopro note for details/recs.

## 2012-05-01 NOTE — Op Note (Signed)
[facility] [address] [place], [address]   ENDOSCOPY PROCEDURE REPORT  PATIENT: Melanie, Liu  MR#: [id number] BIRTHDATE: Mar 29, 1950 , 61  yrs. old GENDER: Female  ENDOSCOPIST: Charlott Rakes, MD REFERRED BY:  PROCEDURE DATE:  05/01/2012 PROCEDURE:   EGD, diagnostic ASA CLASS:   Class III INDICATIONS:Dysphagia. Esophageal stricture MEDICATIONS: Fentanyl 37.5 mcg IV, Versed 3 mg IV, and Cetacaine spray x 2  TOPICAL ANESTHETIC:  DESCRIPTION OF PROCEDURE:   After the risks benefits and alternatives of the procedure were thoroughly explained, informed consent was obtained.  The Pentax Gastroscope [id number]  endoscope was introduced through the mouth and advanced to the second portion of the duodenum , limited by Without limitations.   The instrument was slowly withdrawn as the mucosa was fully examined.     FINDINGS: The endoscope was inserted into the oropharynx and esophagus was intubated. Upon intubating the esophagus there was a ulcerated malignant-appearing esophageal stricture noted 18 cm from the incisors. Mild narrowing of stricture noted that extended from 18 cm  - 22 cm from the incisors.  The gastroesophageal junction was noted to be 40 cm from the incisors and was normal in appearance.  Endoscope was advanced into the stomach, which revealed normal appearing gastric mucosa.  The endoscope was advanced to the duodenal bulb and second portion of duodenum which were unremarkable.  The endoscope was withdrawn back into the stomach and retroflexion revealed a small hiatal hernia. Upon withdrawal into the esophagus a brushing was taken of the stricture.  COMPLICATIONS: None  ENDOSCOPIC IMPRESSION:      Malignant-appearing proximal esophageal stricture - s/p brushing for cytology Small hiatal hernia  RECOMMENDATIONS: F/U on brushing; Carafate; GI cocktail prn; Clear liquids as tolerated   REPEAT  EXAM: N/A  _______________________________ Charlott Rakes, MD eSigned:  Charlott Rakes, MD 05/01/2012 5:20 PM    [id number] Mohamed, MD  PATIENT NAME:  Melanie, Liu MR#: [id number]

## 2012-05-01 NOTE — Progress Notes (Signed)
[facility] PROGRESS NOTE  Melanie Liu EXB:[id number] DOB: 11-05-1950 DOA: 04/30/2012 PCP: [name], MD   Brief narrative 62 year old female with history right upper lobe lung cancer with right frontal lobe lesion currently on chemotherapy , radiation therapy received in December 2013, anemia, COPD with recent exacerbation who presents to the ED with dysphasia since 1 day. Barium swallow in the ED showing proximally esophageal stricture.  Assessment/Plan: ?Esophageal stricture versus esophagitis -Patient admitted to medical floor she was seen by [facility] who think this is likely secondary to esophagitis rather than acute stricture. Recommend intake of sips of  liquid and monitor. She has been tolerating sips of liquid although still has some sticky sensation in the throat. If symptoms do not improve , plan for EGD IV Protonix twice a day. IV Reglan every 6 hours as needed  Recently placed on Diflucan for oral thrush. Switched to IV fluconazole -IV morphine when necessary for pain  Active Problems:  Non-small cell Lung cancer with right frontal lobe lesion  Status post radiation therapy in December and currently getting chemotherapy. Her recent chemotherapy was postponed given low platelets  Follows with [name]  Continue Decadron IV (on 4 mg by mouth twice a day)   Thrombocytopenia, unspecified  Noted on last hospitalization. Platelet currently stable at low level. Hold pharmacological DVT prophylaxis   COPD (chronic obstructive pulmonary disease)  Stable. Was admitted recently for exacerbation. Continue when necessary oxygen and nebs.   Anemia  Stable   DVT prophylaxis: SCD   Code Status: Full code Family Communication: No one at bedside Disposition Plan: Home once stable   Consultants:  [facility]  Procedures:  None  Antibiotics:  IV fluconazole  HPI/Subjective: Patient able to swallow a few sips of liquids. still feels sticky sensation in  the chest.  Objective: Filed Vitals:   04/30/12 1630 04/30/12 1747 04/30/12 2200 05/01/12 0600  BP: 119/84 118/84 111/76 123/76  Pulse: 78 87 89 101  Temp:  97.6 F (36.4 C) 97.9 F (36.6 C) 98.3 F (36.8 C)  TempSrc:  Oral Oral Oral  Resp:  18 20 20   Height:  5\' 2"  (1.575 m)    Weight:  46.267 kg (102 lb)    SpO2: 93% 94% 93% 92%   No intake or output data in the 24 hours ending 05/01/12 1136 Filed Weights   04/30/12 1747  Weight: 46.267 kg (102 lb)    Exam:   General:  Middle aged thin built female in no acute distress  HEENT: No pallor, moist oral mucosa  CVS: Normal S1 and S2, no murmurs rub or gallop  Chest: Clear to auscultation bilaterally no added sound  Abdomen: Soft, nontender, nondistended, bowel sounds present  Extremities: Warm, no edema    CNS: AAO x3  Data Reviewed: Basic Metabolic Panel:  Recent Labs Lab 04/30/12 1240 05/01/12 0525  NA 132* 133*  K 4.0 4.3  CL 95* 99  CO2 23 23  GLUCOSE 91 123*  BUN 26* 23  CREATININE 0.61 0.64  CALCIUM 9.7 8.9   Liver Function Tests:  Recent Labs Lab 04/30/12 1240  AST 21  ALT 17  ALKPHOS 76  BILITOT 0.4  PROT 6.9  ALBUMIN 3.6   No results found for this basename: LIPASE, AMYLASE,  in the last 168 hours No results found for this basename: AMMONIA,  in the last 168 hours CBC:  Recent Labs Lab 04/28/12 1338 04/30/12 1240 05/01/12 0525  WBC 11.0* 10.2 7.0  NEUTROABS 9.5*  8.4*  --   HGB 11.1* 11.2* 10.6*  HCT 33.8* 33.8* 31.4*  MCV 100.6 100.9* 101.6*  PLT 86* 87* 77*   Cardiac Enzymes: No results found for this basename: CKTOTAL, CKMB, CKMBINDEX, TROPONINI,  in the last 168 hours BNP (last 3 results) No results found for this basename: PROBNP,  in the last 8760 hours CBG: No results found for this basename: GLUCAP,  in the last 168 hours  Recent Results (from the past 240 hour(s))  TECHNOLOGIST REVIEW     Status: None   Collection Time    04/28/12  1:38 PM      Result Value  Range Status   Technologist Review rare meta   Final     Studies: Dg Chest 2 View  04/30/2012  *RADIOLOGY REPORT*  Clinical Data: Difficulty swallowing.  Lung cancer.  CHEST - 2 VIEW  Comparison: CT chest and chest radiograph 04/10/2012.  Findings: Trachea is midline.  Heart size normal.  Right upper lobe mass again noted.  Lungs are emphysematous.  No pleural fluid.  IMPRESSION: Right upper lobe mass.  No superimposed acute findings.   Original Report Authenticated By: Leanna Battles, M.D.    Dg Esophagus  04/30/2012  *RADIOLOGY REPORT*  Clinical Data: Difficulty swallowing.  History of lung cancer and radiation therapy.  ESOPHOGRAM/BARIUM SWALLOW  Technique:  Single contrast examination was performed using thin barium.  Fluoroscopy time:  1.11 minutes.  Comparison:  Plain films of the chest this same day and CT chest 04/10/2012.  Findings:  A double contrast study was initially attempted but the patient could not tolerate the study.  Therefore, a single contrast exam was performed.  No evidence inflammatory process or mass is seen in the esophagus on this limited exam.  A 13 mm barium time was given to the patient became lodged in the upper esophagus at the level of the clavicular heads.  IMPRESSION: Findings compatible with proximal esophageal stricture.   Original Report Authenticated By: Holley Dexter, M.D.     Scheduled Meds: . dexamethasone  4 mg Intravenous Q12H  . fluconazole (DIFLUCAN) IV  200 mg Intravenous Q24H  . gi cocktail  30 mL Oral BID  . pantoprazole (PROTONIX) IV  40 mg Intravenous Q12H   Continuous Infusions: . dextrose 5 % and 0.9% NaCl 75 mL/hr (04/30/12 1950)      Time spent: 25 minutes    Hamlin Devine  [facility] Pager [phone]. If 8PM-8AM, please contact night-coverage at [email or web address], password [facility] 05/01/2012, 11:36 AM  LOS: 1 day

## 2012-05-01 NOTE — H&P (View-Only) (Signed)
Patient ID: Melanie Liu, female   DOB: April 05, 1950, 62 y.o.   MRN: [id number] [facility] Progress Note  RENIA Liu 62 y.o. November 23, 1950   Subjective: Reports sensation of food hung still there and is drooling today. Cannot swallow water and has not tried since this morning. GI cocktail helps but sensation comes right back.  Objective: Vital signs: Filed Vitals:   05/01/12 1346  BP: 129/88  Pulse: 91  Temp: 96.9 F (36.1 Liu)  Resp: 20    Physical Exam: Gen: alert, no acute distress  Lab Results:  Recent Labs  04/30/12 1240 05/01/12 0525  NA 132* 133*  K 4.0 4.3  CL 95* 99  CO2 23 23  GLUCOSE 91 123*  BUN 26* 23  CREATININE 0.61 0.64  CALCIUM 9.7 8.9    Recent Labs  04/30/12 1240  AST 21  ALT 17  ALKPHOS 76  BILITOT 0.4  PROT 6.9  ALBUMIN 3.6    Recent Labs  04/30/12 1240 05/01/12 0525  WBC 10.2 7.0  NEUTROABS 8.4*  --   HGB 11.2* 10.6*  HCT 33.8* 31.4*  MCV 100.9* 101.6*  PLT 87* 77*      Assessment/Plan: 62yo with met lung CA on chemo and s/p radiation therapy with odynophagia and dysphagia. EGD today to further assess. Suspect Candida. Not planning on doing a dilation at this time due to thrombocytopenia.   Melanie Liu. 05/01/2012, 2:18 PM

## 2012-05-01 NOTE — Progress Notes (Signed)
Patient ID: Melanie Liu, female   DOB: 01/05/1951, 62 y.o.   MRN: [id number] [facility] Progress Note  Sanam C Gosse 62 y.o. 08/14/1950   62Subjective: Reports sensation of food hung still there and is drooling today. Cannot swallow water and has not tried since this morning. GI cocktail helps but sensation comes right back.  Objective: Vital signs: Filed Vitals:   05/01/12 1346  BP: 129/88  Pulse: 91  Temp: 96.9 F (36.1 C)  Resp: 20    Physical Exam: Gen: alert, no acute distress  Lab Results:  Recent Labs  04/30/12 1240 05/01/12 0525  NA 132* 133*  K 4.0 4.3  CL 95* 99  CO2 23 23  GLUCOSE 91 123*  BUN 26* 23  CREATININE 0.61 0.64  CALCIUM 9.7 8.9    Recent Labs  04/30/12 1240  AST 21  ALT 17  ALKPHOS 76  BILITOT 0.4  PROT 6.9  ALBUMIN 3.6    Recent Labs  04/30/12 1240 05/01/12 0525  WBC 10.2 7.0  NEUTROABS 8.4*  --   HGB 11.2* 10.6*  HCT 33.8* 31.4*  MCV 100.9* 101.6*  PLT 87* 77*      Assessment/Plan: 62yo with met lung CA on chemo and s/p radiation therapy with odynophagia and dysphagia. EGD today to further assess. Suspect Candida. Not planning on doing a dilation at this time due to thrombocytopenia.   Shaunta Oncale C. 05/01/2012, 2:18 PM  

## 2012-05-02 ENCOUNTER — Encounter (HOSPITAL_COMMUNITY): Payer: Self-pay | Admitting: Gastroenterology

## 2012-05-02 MED ORDER — ACYCLOVIR SODIUM 50 MG/ML IV SOLN
5.0000 mg/kg | Freq: Three times a day (TID) | INTRAVENOUS | Status: DC
  Administered 2012-05-02 – 2012-05-06 (×12): 230 mg via INTRAVENOUS
  Filled 2012-05-02 (×14): qty 4.6

## 2012-05-02 NOTE — Progress Notes (Signed)
[facility] PROGRESS NOTE  Melanie Liu ZOX:[id number] DOB: 07/24/1950 DOA: 04/30/2012 PCP: Melanie Hector, MD  Brief narrative  62 year old female with history right upper lobe lung cancer with right frontal lobe lesion currently on chemotherapy , radiation therapy received in December 2013, anemia, COPD with recent exacerbation who presents to the ED with dysphasia since 1 day. Barium swallow in the ED showing proximally esophageal stricture.   Assessment/Plan:  Dysphasia stricture. -Patient admitted to medical floor she was seen by Melanie Liu . Started on IV fluconazole with concern for esophagitis. An EGD was done on 2/27 which showed malignant-appearing proximal dysphasia stricture. The scope was able to pass through the stricture. Brushing was done and sent for cytology. -Continue IV Protonix, IV Reglan, Liu cocktail and Carafate. -Clear recommends continuing patient on clear liquids and follow up on biopsy. -If biopsy suggestive of malignancy will discuss it further with her oncologist Dr. Shirline Liu.  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 Her recent chemotherapy was postponed given low platelets  Follows with Dr. Arbutus Liu . (He is off today so I could not contact him) Continue Decadron IV (on 4 mg by mouth twice a day at home)   Thrombocytopenia, unspecified  Noted on last hospitalization. Platelet currently stable at low level. Hold pharmacological DVT prophylaxis   COPD (chronic obstructive pulmonary disease)  Stable. Was admitted recently for exacerbation. Continue when necessary oxygen and nebs.   Anemia  Stable   DVT prophylaxis: SCD   Code Status: Full code  Family Communication: Son at bedside Disposition Plan: Home once stable   Consultants:  Melanie Liu   Procedures:  ED on 2/27  Antibiotics:  IV fluconazole HPI/Subjective:  Patient  still complains of dysphagia and able to follow few sips. Discussed EGD findings.  Objective: Filed Vitals:   05/01/12 1755 05/01/12 2200 05/02/12 0600 05/02/12 0917  BP: 124/92 126/85 127/86   Pulse:  88 101   Temp:  97 F (36.1 C) 97.7 F (36.5 C)   TempSrc:  Oral Oral   Resp: 23 20 20    Height:      Weight:      SpO2: 94% 95% 96% 96%    Intake/Output Summary (Last 24 hours) at 05/02/12 1101 Last data filed at 05/01/12 1830  Gross per 24 hour  Intake   1900 ml  Output      0 ml  Net   1900 ml   Filed Weights   04/30/12 1747  Weight: 46.267 kg (102 lb)    Exam:  General: Middle aged thin built female in no acute distress  HEENT: No pallor, moist oral mucosa  CVS: Normal S1 and S2, no murmurs rub or gallop  Chest: Clear to auscultation bilaterally no added sound  Abdomen: Soft, nontender, nondistended, bowel sounds present  Extremities: Warm, no edema  CNS: AAO x3   Data Reviewed: Basic Metabolic Panel:  Recent Labs Lab 04/30/12 1240 05/01/12 0525  NA 132* 133*  K 4.0 4.3  CL 95* 99  CO2 23 23  GLUCOSE 91 123*  BUN 26* 23  CREATININE 0.61 0.64  CALCIUM 9.7 8.9   Liver Function Tests:  Recent Labs Lab 04/30/12 1240  AST 21  ALT 17  ALKPHOS 76  BILITOT 0.4  PROT 6.9  ALBUMIN 3.6   No results found for this basename: LIPASE, AMYLASE,  in the last 168 hours No results found for  this basename: AMMONIA,  in the last 168 hours CBC:  Recent Labs Lab 04/28/12 1338 04/30/12 1240 05/01/12 0525  WBC 11.0* 10.2 7.0  NEUTROABS 9.5* 8.4*  --   HGB 11.1* 11.2* 10.6*  HCT 33.8* 33.8* 31.4*  MCV 100.6 100.9* 101.6*  PLT 86* 87* 77*   Cardiac Enzymes: No results found for this basename: CKTOTAL, CKMB, CKMBINDEX, TROPONINI,  in the last 168 hours BNP (last 3 results) No results found for this basename: PROBNP,  in the last 8760 hours CBG: No results found for this basename: GLUCAP,  in the last 168 hours  Recent Results (from the past 240  hour(s))  TECHNOLOGIST REVIEW     Status: None   Collection Time    04/28/12  1:38 PM      Result Value Range Status   Technologist Review rare meta   Final     Studies: Dg Chest 2 View  04/30/2012  *RADIOLOGY REPORT*  Clinical Data: Difficulty swallowing.  Lung cancer.  CHEST - 2 VIEW  Comparison: CT chest and chest radiograph 04/10/2012.  Findings: Trachea is midline.  Heart size normal.  Right upper lobe mass again noted.  Lungs are emphysematous.  No pleural fluid.  IMPRESSION: Right upper lobe mass.  No superimposed acute findings.   Original Report Authenticated By: Leanna Battles, M.D.    Dg Esophagus  04/30/2012  *RADIOLOGY REPORT*  Clinical Data: Difficulty swallowing.  History of lung cancer and radiation therapy.  ESOPHOGRAM/BARIUM SWALLOW  Technique:  Single contrast examination was performed using thin barium.  Fluoroscopy time:  1.11 minutes.  Comparison:  Plain films of the chest this same day and CT chest 04/10/2012.  Findings:  A double contrast study was initially attempted but the patient could not tolerate the study.  Therefore, a single contrast exam was performed.  No evidence inflammatory process or mass is seen in the esophagus on this limited exam.  A 13 mm barium time was given to the patient became lodged in the upper esophagus at the level of the clavicular heads.  IMPRESSION: Findings compatible with proximal esophageal stricture.   Original Report Authenticated By: Holley Dexter, M.D.     Scheduled Meds: . dexamethasone  4 mg Intravenous Q12H  . fluconazole (DIFLUCAN) IV  200 mg Intravenous Q24H  . Liu cocktail  30 mL Oral BID  . pantoprazole (PROTONIX) IV  40 mg Intravenous Q12H  . sucralfate  1 g Oral TID WC & HS   Continuous Infusions: . dextrose 5 % and 0.9% NaCl 75 mL/hr at 05/01/12 2143      Time spent: 25 minutes    Melanie Liu  [facility] Pager [phone] If 8PM-8AM, please contact night-coverage at [email or web address], password  [facility] 05/02/2012, 11:01 AM  LOS: 2 days

## 2012-05-02 NOTE — Progress Notes (Signed)
Patient ID: Melanie Liu, female   DOB: 11-08-1950, 62 y.o.   MRN: [id number]   Dr. 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 Will discuss with Dr. Gonzella Lex.

## 2012-05-02 NOTE — Progress Notes (Signed)
[facility] Progress Note  Subjective: And same level of dysphagia, for the most part keeping down clear liquids with some occasional regurgitation since procedure yesterday symptoms basically unchanged  Objective: Vital signs in last 24 hours: Temp:  [96.9 F (36.1 Liu)-97.7 F (36.5 Liu)] 97.7 F (36.5 Liu) (02/28 0600) Pulse Rate:  [84-101] 101 (02/28 0600) Resp:  [16-23] 20 (02/28 0600) BP: (124-146)/(78-97) 127/86 mmHg (02/28 0600) SpO2:  [93 %-100 %] 96 % (02/28 0600) Weight change:    PE: Unchanged  Lab Results: No results found for this or any previous visit (from the past 24 hour(s)).  Studies/Results: Dg Chest 2 View  04/30/2012  *RADIOLOGY REPORT*  Clinical Data: Difficulty swallowing.  Lung cancer.  CHEST - 2 VIEW  Comparison: CT chest and chest radiograph 04/10/2012.  Findings: Trachea is midline.  Heart size normal.  Right upper lobe mass again noted.  Lungs are emphysematous.  No pleural fluid.  IMPRESSION: Right upper lobe mass.  No superimposed acute findings.   Original Report Authenticated By: Leanna Battles, M.D.    Dg Esophagus  04/30/2012  *RADIOLOGY REPORT*  Clinical Data: Difficulty swallowing.  History of lung cancer and radiation therapy.  ESOPHOGRAM/BARIUM SWALLOW  Technique:  Single contrast examination was performed using thin barium.  Fluoroscopy time:  1.11 minutes.  Comparison:  Plain films of the chest this same day and CT chest 04/10/2012.  Findings:  A double contrast study was initially attempted but the patient could not tolerate the study.  Therefore, a single contrast exam was performed.  No evidence inflammatory process or mass is seen in the esophagus on this limited exam.  A 13 mm barium time was given to the patient became lodged in the upper esophagus at the level of the clavicular heads.  IMPRESSION: Findings compatible with proximal esophageal stricture.   Original Report Authenticated By: Holley Dexter, M.D.        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 If malignant will need to get Dr. Asa Lente assistance based on her previous recent therapy and ongoing chemotherapy for primary lung cancer. Will hold at clear liquid diet for now.    Melanie Liu 05/02/2012, 9:08 AM

## 2012-05-03 NOTE — Progress Notes (Signed)
[facility] PROGRESS NOTE  Melanie Liu:[id number] DOB: [birth year] DOA: 04/30/2012 PCP: [name], MD  Brief narrative  62 year old female with history right upper lobe lung cancer with right frontal lobe lesion currently on chemotherapy , radiation therapy received in December 2013, anemia, COPD with recent exacerbation who presents to the ED with dysphasia since 1 day. Barium swallow in the ED showing proximally esophageal stricture.   Assessment/Plan:  Dysphasia stricture.  -Patient admitted to medical floor she was seen by [facility] . Started on IV fluconazole with concern for esophagitis. An EGD was done on 2/27 which showed malignant-appearing proximal dysphasia stricture. The scope was able to pass through the stricture. Brushing suggestive of herpetic infection. Sent for HSV PCR. -started on IV acyclovir. symptomatically feeling better today. Will advance diet slowly  if toelrated -Continue IV Protonix, IV Reglan, GI cocktail and Carafate.   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 Her recent chemotherapy was postponed given low platelets  Follows with [name] . Continue Decadron IV (on 4 mg by mouth twice a day at home)   Thrombocytopenia, unspecified  Noted on last hospitalization. Platelet currently stable at low level. Hold pharmacological DVT prophylaxis   COPD (chronic obstructive pulmonary disease)  Stable. Was admitted recently for exacerbation. Continue when necessary oxygen and nebs.   Anemia  Stable   DVT prophylaxis: SCD  Code Status: Full code  Family Communication: no one  at bedside  Disposition Plan: Home once stable    Consultants:  [facility] Procedures:  EGD on 2/27 Antibiotics:  IV fluconazole IV acyclovir ( 2/28)  HPI/Subjective: Pain on swallowing much better. Able to take sips better  today  Objective: Filed Vitals:   05/02/12 0917 05/02/12 1342 05/02/12 2134 05/03/12 0540  BP:  125/87 129/84 117/82  Pulse:  80 73 104  Temp:  97.8 F (36.6 C) 97.7 F (36.5 C) 97.7 F (36.5 C)  TempSrc:  Oral Oral Oral  Resp:  18 19 18   Height:      Weight:      SpO2: 96% 95% 95% 97%    Intake/Output Summary (Last 24 hours) at 05/03/12 0955 Last data filed at 05/03/12 0600  Gross per 24 hour  Intake 2867.1 ml  Output    300 ml  Net 2567.1 ml   Filed Weights   04/30/12 1747  Weight: 46.267 kg (102 lb)    Exam:  General: Middle aged thin built female in no acute distress  HEENT: No pallor, moist oral mucosa  CVS: Normal S1 and S2, no murmurs rub or gallop  Chest: Clear to auscultation bilaterally no added sound  Abdomen: Soft, nontender, nondistended, bowel sounds present  Extremities: Warm, no edema  CNS: AAO x3   Data Reviewed: Basic Metabolic Panel:  Recent Labs Lab 04/30/12 1240 05/01/12 0525  NA 132* 133*  K 4.0 4.3  CL 95* 99  CO2 23 23  GLUCOSE 91 123*  BUN 26* 23  CREATININE 0.61 0.64  CALCIUM 9.7 8.9   Liver Function Tests:  Recent Labs Lab 04/30/12 1240  AST 21  ALT 17  ALKPHOS 76  BILITOT 0.4  PROT 6.9  ALBUMIN 3.6   No results found for this basename: LIPASE, AMYLASE,  in the last 168 hours No results found for this basename: AMMONIA,  in the last 168 hours CBC:  Recent Labs Lab 04/28/12 1338 04/30/12 1240 05/01/12  0525  WBC 11.0* 10.2 7.0  NEUTROABS 9.5* 8.4*  --   HGB 11.1* 11.2* 10.6*  HCT 33.8* 33.8* 31.4*  MCV 100.6 100.9* 101.6*  PLT 86* 87* 77*   Cardiac Enzymes: No results found for this basename: CKTOTAL, CKMB, CKMBINDEX, TROPONINI,  in the last 168 hours BNP (last 3 results) No results found for this basename: PROBNP,  in the last 8760 hours CBG: No results found for this basename: GLUCAP,  in the last 168 hours  Recent Results (from the past 240 hour(s))  TECHNOLOGIST REVIEW     Status: None    Collection Time    04/28/12  1:38 PM      Result Value Range Status   Technologist Review rare meta   Final     Studies: No results found.  Scheduled Meds: . acyclovir  5 mg/kg Intravenous Q8H  . dexamethasone  4 mg Intravenous Q12H  . fluconazole (DIFLUCAN) IV  200 mg Intravenous Q24H  . gi cocktail  30 mL Oral BID  . pantoprazole (PROTONIX) IV  40 mg Intravenous Q12H  . sucralfate  1 g Oral TID WC & HS   Continuous Infusions: . dextrose 5 % and 0.9% NaCl 1,000 mL (05/03/12 0520)      Time spent: 25 minutes    Shalona Harbour  [facility] Pager [phone]. If 8PM-8AM, please contact night-coverage at [email or web address], password [facility] 05/03/2012, 9:55 AM  LOS: 3 days

## 2012-05-03 NOTE — Progress Notes (Signed)
[facility] PROGRESS NOTE Subjective Brushing apparently showed herpetic ulcer and was started on acyclovir. Some pain with swallowing but may be some better  Objective: Vital signs in last 24 hours: Temp:  [97.3 F (36.3 C)-97.7 F (36.5 C)] 97.3 F (36.3 C) (03/01 1339) Pulse Rate:  [73-104] 97 (03/01 1339) Resp:  [18-19] 18 (03/01 1339) BP: (117-129)/(82-86) 129/86 mmHg (03/01 1339) SpO2:  [95 %-97 %] 95 % (03/01 1339) Last BM Date: 05/03/12  Intake/Output from previous day: 02/28 0701 - 03/01 0700 In: 2867.1 [I.V.:2662.5; IV Piggyback:204.6] Out: 300 [Urine:300] Intake/Output this shift: Total I/O In: 600 [I.V.:600] Out: -    Lab Results:  Recent Labs  05/01/12 0525  WBC 7.0  HGB 10.6*  HCT 31.4*  PLT 77*   BMET  Recent Labs  05/01/12 0525  NA 133*  K 4.3  CL 99  CO2 23  CREATININE 0.64   LFT No results found for this basename: PROT, AST, ALT, ALKPHOS, BILITOT, BILIDIR, IBILI,  in the last 72 hours PT/INR No results found for this basename: LABPROT, INR,  in the last 72 hours PANCREAS No results found for this basename: LIPASE,  in the last 72 hours       Studies/Results: No results found.  Medications: I have reviewed the patient's current medications.  Assessment/Plan: 1. Herpetic Ulcer. Hopefully her dysphagia will improve with course of antiviral therapy. We will be available if not better after treatment. Still could need dilatation, but need to wait and see how she does after adequate course of treatment.   Melanie Liu JR,Teletha Petrea L 05/03/2012, 2:15 PM

## 2012-05-04 MED ORDER — GI COCKTAIL ~~LOC~~
30.0000 mL | Freq: Two times a day (BID) | ORAL | Status: DC
  Filled 2012-05-04: qty 30

## 2012-05-04 MED ORDER — GI COCKTAIL ~~LOC~~
30.0000 mL | Freq: Two times a day (BID) | ORAL | Status: DC
  Administered 2012-05-04 – 2012-05-06 (×4): 30 mL via ORAL
  Filled 2012-05-04 (×6): qty 30

## 2012-05-04 NOTE — Progress Notes (Signed)
[facility] PROGRESS NOTE  Melanie Liu:[id number] DOB: Dec 08, 1950 DOA: 04/30/2012 PCP: [name], MD  Brief narrative  62 year old female with history right upper lobe lung cancer with right frontal lobe lesion currently on chemotherapy , radiation therapy received in December 2013, anemia, COPD with recent exacerbation who presents to the ED with dysphasia since 1 day. Barium swallow in the ED showing proximally esophageal stricture.   Assessment/Plan:  Esophageals stricture with dysphagia  . Started on IV fluconazole with concern for esophagitis. An EGD was done on 2/27 which showed malignant-appearing proximal dysphasia stricture. The scope was able to pass through the stricture. Brushing suggestive of herpetic lesion. HSV PCR sent -started on IV acyclovir. symptomatically feeling better and tolerating clears. Will advance diet slowly -Continue IV Protonix, IV Reglan, GI cocktail and Carafate.   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 Her recent chemotherapy was postponed given low platelets  Follows with [name] . (He is off today so I could not contact him)  Continue Decadron IV (on 4 mg by mouth twice a day at home)   Thrombocytopenia, unspecified  Noted on last hospitalization. Platelet currently stable at low level. Hold pharmacological DVT prophylaxis   COPD (chronic obstructive pulmonary disease)  Stable. Was admitted recently for exacerbation. Continue when necessary oxygen and nebs.   Anemia  Stable   DVT prophylaxis: SCD  Code Status: Full code  Family Communication: Son at bedside  Disposition Plan: Home once stable likely in 1-2 days  Consultants:  [facility] Procedures:  EGD on 2/27 Antibiotics:  IV fluconazole IV acyclovir ( day 3)   HPI/Subjective:  Dysphagia and odynophagia improving. tolerating clears      Objective: Filed Vitals:   05/03/12 0540 05/03/12 1339 05/03/12 2202 05/04/12 0635  BP: 117/82 129/86 113/77 110/79  Pulse: 104 97 90 87  Temp: 97.7 F (36.5 C) 97.3 F (36.3 C) 98.3 F (36.8 C) 98.1 F (36.7 C)  TempSrc: Oral Oral Oral Oral  Resp: 18 18 20 16   Height:      Weight:      SpO2: 97% 95% 96% 98%    Intake/Output Summary (Last 24 hours) at 05/04/12 1046 Last data filed at 05/04/12 8119  Gross per 24 hour  Intake   2610 ml  Output      0 ml  Net   2610 ml   Filed Weights   04/30/12 1747  Weight: 46.267 kg (102 lb)    Exam: General: Middle aged thin built female in no acute distress  HEENT: No pallor, moist oral mucosa  CVS: Normal S1 and S2, no murmurs rub or gallop  Chest: Clear to auscultation bilaterally no added sound  Abdomen: Soft, nontender, nondistended, bowel sounds present  Extremities: Warm, no edema  CNS: AAO x3     Data Reviewed: Basic Metabolic Panel:  Recent Labs Lab 04/30/12 1240 05/01/12 0525  NA 132* 133*  K 4.0 4.3  CL 95* 99  CO2 23 23  GLUCOSE 91 123*  BUN 26* 23  CREATININE 0.61 0.64  CALCIUM 9.7 8.9   Liver Function Tests:  Recent Labs Lab 04/30/12 1240  AST 21  ALT 17  ALKPHOS 76  BILITOT 0.4  PROT 6.9  ALBUMIN 3.6   No results found for this basename: LIPASE, AMYLASE,  in the last 168 hours No results found for this basename: AMMONIA,  in the last 168  hours CBC:  Recent Labs Lab 04/28/12 1338 04/30/12 1240 05/01/12 0525  WBC 11.0* 10.2 7.0  NEUTROABS 9.5* 8.4*  --   HGB 11.1* 11.2* 10.6*  HCT 33.8* 33.8* 31.4*  MCV 100.6 100.9* 101.6*  PLT 86* 87* 77*   Cardiac Enzymes: No results found for this basename: CKTOTAL, CKMB, CKMBINDEX, TROPONINI,  in the last 168 hours BNP (last 3 results) No results found for this basename: PROBNP,  in the last 8760 hours CBG: No results found for this basename: GLUCAP,  in the last 168 hours  Recent Results (from the past 240 hour(s))  TECHNOLOGIST  REVIEW     Status: None   Collection Time    04/28/12  1:38 PM      Result Value Range Status   Technologist Review rare meta   Final     Studies: No results found.  Scheduled Meds: . acyclovir  5 mg/kg Intravenous Q8H  . dexamethasone  4 mg Intravenous Q12H  . fluconazole (DIFLUCAN) IV  200 mg Intravenous Q24H  . pantoprazole (PROTONIX) IV  40 mg Intravenous Q12H  . sucralfate  1 g Oral TID WC & HS   Continuous Infusions: . dextrose 5 % and 0.9% NaCl 1,000 mL (05/03/12 0520)      Time spent: 25 minutes    DHUNGEL, NISHANT  [facility] Pager [phone] 8PM-8AM, please contact night-coverage at [email or web address], password [facility] 05/04/2012, 10:46 AM  LOS: 4 days

## 2012-05-05 ENCOUNTER — Telehealth: Payer: Self-pay | Admitting: Medical Oncology

## 2012-05-05 ENCOUNTER — Other Ambulatory Visit: Payer: PRIVATE HEALTH INSURANCE

## 2012-05-05 ENCOUNTER — Ambulatory Visit: Payer: PRIVATE HEALTH INSURANCE

## 2012-05-05 LAB — HSV(HERPES SMPLX)ABS-I+II(IGG+IGM)-BLD
HSV 1 Glycoprotein G Ab, IgG: 9.34 IV — ABNORMAL HIGH
HSV 2 Glycoprotein G Ab, IgG: <0.1 IV
Herpes Simplex Vrs I&II-IgM Ab (EIA): 0.35 INDEX

## 2012-05-05 LAB — CBC
MCH: 33 pg (ref 26.0–34.0)
MCV: 100.7 fL — ABNORMAL HIGH (ref 78.0–100.0)
Platelets: 100 10*3/uL — ABNORMAL LOW (ref 150–400)
RBC: 3 MIL/uL — ABNORMAL LOW (ref 3.87–5.11)
RDW: 19.8 % — ABNORMAL HIGH (ref 11.5–15.5)

## 2012-05-05 MED ORDER — PANTOPRAZOLE SODIUM 40 MG PO PACK
40.0000 mg | PACK | Freq: Two times a day (BID) | ORAL | Status: DC
  Administered 2012-05-05 – 2012-05-06 (×3): 40 mg via ORAL
  Filled 2012-05-05 (×5): qty 20

## 2012-05-05 NOTE — Telephone Encounter (Signed)
Pharmacist asking if Dr Arbutus Ped wants to give chemo today -Per Dr Arbutus Ped I told inpt pharmacist he does not want to give pt chemo.

## 2012-05-05 NOTE — Progress Notes (Addendum)
[facility] PROGRESS NOTE  Melanie Liu JYN:[id number] DOB: June 18, 1950 DOA: 04/30/2012 PCP: [name], MD  Brief narrative  62 year old female with history right upper lobe lung cancer with right frontal lobe lesion currently on chemotherapy , radiation therapy received in December 2013, anemia, COPD with recent exacerbation who presents to the ED with dysphasia since 1 day. Barium swallow in the ED showing proximally esophageal stricture.   Assessment/Plan:  Esophageals stricture with dysphagia  . Started on IV fluconazole with concern for esophagitis. An EGD was done on 2/27 which showed malignant-appearing proximal dysphasia stricture. The scope was able to pass through the stricture. Brushing suggestive of herpetic lesion. HSV PCR sent and results pending. -started on IV acyclovir. symptomatically feeling better and tolerating full liquids. We'll advance diet to regular -Switch Protonix to oral (liquid) continue IV Reglan, GI cocktail and Carafate.   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 Her recent chemotherapy was postponed given low platelets. Follows with [name] whom I have notified today. Continue Decadron IV (on 4 mg by mouth twice a day at home)   Thrombocytopenia, unspecified  Noted on last hospitalization. Platelet currently stable at low level. Hold pharmacological DVT prophylaxis .  COPD (chronic obstructive pulmonary disease)  Stable. Was admitted recently for exacerbation. Continue when necessary oxygen and nebs.   Anemia  Stable . Check CBC today  DVT prophylaxis: SCD  Code Status: Full code  Family Communication: Son at bedside  Disposition Plan: Home in a.m. if able to tolerate advance diet today  Consultants:  [facility] Procedures:  EGD on 2/27 Antibiotics:  IV fluconazole  IV acyclovir ( day 4)  HPI/Subjective: Feels  better today and has been tolerating full liquid well. Agrees to try regular diet.  Objective: Filed Vitals:   05/04/12 0635 05/04/12 1413 05/04/12 2107 05/05/12 0534  BP: 110/79 120/79 115/78 124/85  Pulse: 87 98 96 97  Temp: 98.1 F (36.7 C) 97.4 F (36.3 C) 98.2 F (36.8 C) 97.7 F (36.5 C)  TempSrc: Oral Oral Oral Axillary  Resp: 16 18 16 18   Height:      Weight:      SpO2: 98% 97% 99% 96%    Intake/Output Summary (Last 24 hours) at 05/05/12 1337 Last data filed at 05/05/12 0848  Gross per 24 hour  Intake   1509 ml  Output      0 ml  Net   1509 ml   Filed Weights   04/30/12 1747  Weight: 46.267 kg (102 lb)    Exam:  General: Middle aged thin built female in no acute distress  HEENT: No pallor, moist oral mucosa  CVS: Normal S1 and S2, no murmurs rub or gallop  Chest: Clear to auscultation bilaterally no added sound  Abdomen: Soft, nontender, nondistended, bowel sounds present  Extremities: Warm, no edema  CNS: AAO x3   Data Reviewed: Basic Metabolic Panel:  Recent Labs Lab 04/30/12 1240 05/01/12 0525  NA 132* 133*  K 4.0 4.3  CL 95* 99  CO2 23 23  GLUCOSE 91 123*  BUN 26* 23  CREATININE 0.61 0.64  CALCIUM 9.7 8.9   Liver Function Tests:  Recent Labs Lab 04/30/12 1240  AST 21  ALT 17  ALKPHOS 76  BILITOT 0.4  PROT 6.9  ALBUMIN 3.6   No results found for this basename: LIPASE, AMYLASE,  in the last 168 hours No results  found for this basename: AMMONIA,  in the last 168 hours CBC:  Recent Labs Lab 04/28/12 1338 04/30/12 1240 05/01/12 0525  WBC 11.0* 10.2 7.0  NEUTROABS 9.5* 8.4*  --   HGB 11.1* 11.2* 10.6*  HCT 33.8* 33.8* 31.4*  MCV 100.6 100.9* 101.6*  PLT 86* 87* 77*   Cardiac Enzymes: No results found for this basename: CKTOTAL, CKMB, CKMBINDEX, TROPONINI,  in the last 168 hours BNP (last 3 results) No results found for this basename: PROBNP,  in the last 8760 hours CBG: No results found for this basename: GLUCAP,  in the  last 168 hours  Recent Results (from the past 240 hour(s))  TECHNOLOGIST REVIEW     Status: None   Collection Time    04/28/12  1:38 PM      Result Value Range Status   Technologist Review rare meta   Final     Studies: No results found.  Scheduled Meds: . acyclovir  5 mg/kg Intravenous Q8H  . dexamethasone  4 mg Intravenous Q12H  . fluconazole (DIFLUCAN) IV  200 mg Intravenous Q24H  . gi cocktail  30 mL Oral BID WC  . pantoprazole (PROTONIX) IV  40 mg Intravenous Q12H  . sucralfate  1 g Oral TID WC & HS   Continuous Infusions: . dextrose 5 % and 0.9% NaCl 75 mL/hr at 05/05/12 0853      Time spent: 25 minutes    DHUNGEL, NISHANT  [facility] Pager [phone] If 8PM-8AM, please contact night-coverage at [email or web address], password [facility] 05/05/2012, 1:37 PM  LOS: 5 days

## 2012-05-06 MED ORDER — OMEPRAZOLE 40 MG PO CPDR: 40.0000 mg | DELAYED_RELEASE_CAPSULE | Freq: Every day | ORAL | Status: DC

## 2012-05-06 MED ORDER — ACYCLOVIR 400 MG PO TABS: 400.0000 mg | ORAL_TABLET | Freq: Every day | ORAL | Status: DC

## 2012-05-06 MED ORDER — PANTOPRAZOLE SODIUM 40 MG PO PACK: 40.0000 mg | PACK | Freq: Two times a day (BID) | ORAL | Status: DC

## 2012-05-06 MED ORDER — GI COCKTAIL ~~LOC~~: 30.0000 mL | Freq: Two times a day (BID) | ORAL | Status: DC

## 2012-05-06 MED ORDER — SUCRALFATE 1 G PO TABS: 1.0000 g | ORAL_TABLET | ORAL | Status: DC

## 2012-05-06 MED ORDER — ALUM & MAG HYDROXIDE-SIMETH 400-400-40 MG/5ML PO SUSP: 5.0000 mL | Freq: Four times a day (QID) | ORAL | Status: DC | PRN

## 2012-05-06 MED ORDER — ACYCLOVIR 400 MG PO TABS
400.0000 mg | ORAL_TABLET | Freq: Every day | ORAL | Status: DC
  Administered 2012-05-06: 400 mg via ORAL
  Filled 2012-05-06 (×5): qty 1

## 2012-05-06 NOTE — Progress Notes (Signed)
Met with pt to discuss d/c planning. She informed me that she lost her job and hence has no insurance even if [employer] is showing Med cost as her payer. I called Med cost and was informed her insurance termed 04/12/12 Pt needs help with her prescriptions. She informed me that she is 100% approved for assistance through the cancer center. She also stated she has applied or Medicaid. I spoke with Lauren at the cancer center and she verified that yes the pt is 100 % approved for assistance and that she needs to take her prescriptions to the [facility] to have them filled. I explained this to the pt and she verbalized understanding.

## 2012-05-06 NOTE — Discharge Summary (Addendum)
Physician Discharge Summary  Melanie Liu:[id number] DOB: 1950-12-17 DOA: 04/30/2012  PCP: [name], MD  Admit date: 04/30/2012 Discharge date: 05/06/2012  Time spent:40  minutes  Recommendations for Outpatient Follow-up:  Home with outpatient follow up with oncology and radiation onc.  Needs cbc checked weekly   Discharge Diagnoses:  Principal Problem:   Esophageal stricture   Active Problems: Acute esophagitis secondary to herpes simplex   Lung cancer   Right frontal lobe lesion   Thrombocytopenia, unspecified   COPD (chronic obstructive pulmonary disease)   Acute esophagitis   Dysphagia   Discharge Condition: fair Diet recommendation: regular  Filed Weights   04/30/12 1747  Weight: 46.267 kg (102 lb)    History of present illness:  Please refer to admission H&P for detail , but in brief, 62 year old female with history right upper lobe lung cancer with right frontal lobe lesion currently on chemotherapy , radiation therapy received in December 2013, anemia, COPD with recent exacerbation who presents to the ED with dysphasia since 1 day. Barium swallow in the ED showing proximally esophageal stricture.   Hospital Course:    Esophageals stricture with dysphagia  Started on IV fluconazole with concern for esophagitis. An EGD was done on 2/27 which showed malignant-appearing proximal dysphasia stricture. The scope was able to pass through the stricture. Brushing suggestive of herpetic lesion. HSV PCR sent and positive  -started on IV acyclovir. Symptoms improved and tolerating advanced diet. continue protonix bid,  GI cocktail and Carafate.  -will discharge on oral acyclovir 400 mg 5 times a day for next 16 days to complete a 21 day course.    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 Her recent chemotherapy was postponed given low platelets.   Continue decadron Follows with [name] who is aware of patient's hospitalization.     Thrombocytopenia, unspecified  Noted on last hospitalization. Platelet currently stable at low level.    COPD (chronic obstructive pulmonary disease)  Stable. Was admitted recently for exacerbation. Continue when necessary oxygen and nebs.   Anemia  Stable .  Code Status: Full code   Disposition Plan: home with outpt follow up   Consultants:  [facility]   Procedures:  EGD on 2/27   Antibiotics:  IV fluconazole  IV acyclovir       Discharge Exam: Filed Vitals:   05/05/12 0534 05/05/12 1416 05/05/12 2130 05/06/12 0500  BP: 124/85 109/73 109/76 134/84  Pulse: 97 94 100 88  Temp: 97.7 F (36.5 C) 97.5 F (36.4 C) 98.4 F (36.9 C) 98.2 F (36.8 C)  TempSrc: Axillary Oral Oral Oral  Resp: 18 18 20 20   Height:      Weight:      SpO2: 96% 95% 96% 98%    General: Middle aged thin built female in no acute distress  HEENT: No pallor, moist oral mucosa  CVS: Normal S1 and S2, no murmurs rub or gallop  Chest: Clear to auscultation bilaterally no added sound  Abdomen: Soft, nontender, nondistended, bowel sounds present  Extremities: Warm, no edema  CNS: AAO x3   Discharge Instructions   Future Appointments [facility] Department Dept Phone   05/12/2012 7:45 AM Sherrie George, MD [facility] [phone]   05/12/2012 11:45 AM [facility] MEDICAL ONCOLOGY [phone]   05/23/2012 7:00 AM [facility]-Ct 2 [facility] [phone]  Patient to arrive 15 minutes prior to appointment time. Patient to pick up oral contrast at least 1 day prior to exam. No solid food 4 hours prior to exam. Liquids and Medicines are okay.   05/26/2012 9:00 AM [name], MD [facility] RADIATION ONCOLOGY [phone]   05/26/2012 11:30 AM [facility] MEDICAL ONCOLOGY [phone]    05/26/2012 12:00 PM [name], MD [facility] MEDICAL ONCOLOGY [phone]       Medication List    STOP taking these medications       fluconazole 100 MG tablet  Commonly known as:  DIFLUCAN      TAKE these medications       acyclovir 400 MG tablet  Commonly known as:  ZOVIRAX  Take 1 tablet (400 mg total) by mouth 5 (five) times daily.     albuterol (5 MG/ML) 0.5% nebulizer solution  Commonly known as:  PROVENTIL  Take 0.5 mLs (2.5 mg total) by nebulization every 4 (four) hours as needed for wheezing or shortness of breath.     calcium-vitamin D 500-200 MG-UNIT per tablet  Commonly known as:  OSCAL WITH D  Take 1 tablet by mouth daily.     dexamethasone 4 MG tablet  Commonly known as:  DECADRON  Take 4 mg by mouth 2 (two) times daily.     emollient cream  Commonly known as:  BIAFINE  Apply topically 2 (two) times daily.     folic acid 1 MG tablet  Commonly known as:  FOLVITE  Take 1 tablet (1 mg total) by mouth daily.     maalox susp 15 ml every 6 hrs as needed for dysphagia       glucosamine-chondroitin 500-400 MG tablet  Take 1 tablet by mouth daily.     guaiFENesin 100 MG/5ML Soln  Commonly known as:  ROBITUSSIN  Take 20 mLs (400 mg total) by mouth every 6 (six) hours as needed.     ipratropium 0.02 % nebulizer solution  Commonly known as:  ATROVENT  Take 0.5 mg by nebulization every 4 (four) hours as needed (pt mixes with albuterol).     oxyCODONE-acetaminophen 5-325 MG per tablet  Commonly known as:  PERCOCET/ROXICET  Take 1-2 tablets by mouth every 4 (four) hours as needed for pain.     prilosec 40 mg po daily         polyethylene glycol packet  Commonly known as:  MIRALAX / GLYCOLAX  Take 17 g by mouth daily.     PRESCRIPTION MEDICATION  Place into the left eye every 30 (thirty) days. Bevacizumab (Avastin) inj into the left eye     PRESCRIPTION MEDICATION  [facility]. Last treatment was on 04-10-12. Pt's on an  every 3 week cycle. Pt was supposed to have a chemo treatment on Monday  04-28-12 but that treatment was cancelled. Followed by Dr Legrand Como.     sucralfate 1 G tablet  Commonly known as:  CARAFATE  Take 1 tablet (1 g total) by mouth 3 (three) times a week. 5 min before food     TUSSIONEX PENNKINETIC ER 10-8 MG/5ML Lqcr  Generic drug:  chlorpheniramine-HYDROcodone  Take 5 mLs by mouth every 12 (twelve) hours as needed. Called in by MD to [name]. [phone]     vitamin E 400 UNIT capsule  Take 400 Units by mouth daily.           Follow-up  Information   Follow up with [name], MD In 1 week.   Contact information:   [address] [facility] [address] [phone]        The results of significant diagnostics from this hospitalization (including imaging, microbiology, ancillary and laboratory) are listed below for reference.    Significant Diagnostic Studies: Dg Chest 2 View  04/30/2012  *RADIOLOGY REPORT*  Clinical Data: Difficulty swallowing.  Lung cancer.  CHEST - 2 VIEW  Comparison: CT chest and chest radiograph 04/10/2012.  Findings: Trachea is midline.  Heart size normal.  Right upper lobe mass again noted.  Lungs are emphysematous.  No pleural fluid.  IMPRESSION: Right upper lobe mass.  No superimposed acute findings.   Original Report Authenticated By: Leanna Battles, M.D.    Dg Chest 2 View  04/10/2012  *RADIOLOGY REPORT*  Clinical Data: Short of breath  CHEST - 2 VIEW  Comparison: 11/23/2011  Findings: Thorax is rotated to the right limiting the study.  Right upper lobe mass is smaller.  Lungs remain hyperaerated.  Normal heart size.  No pneumothorax.  IMPRESSION: Thorax rotated to the right limiting the study.  Right upper lobe mass is smaller compatible with a positive response to therapy.   Original Report Authenticated By: Jolaine Click, M.D.    Ct Angio Chest Pe W/cm &/or Wo Cm  04/10/2012  *RADIOLOGY REPORT*  Clinical Data: History of lung  carcinoma, shortness of breath, cough  CT ANGIOGRAPHY CHEST  Technique:  Multidetector CT imaging of the chest using the standard protocol during bolus administration of intravenous contrast. Multiplanar reconstructed images including MIPs were obtained and reviewed to evaluate the vascular anatomy.  Contrast: 80mL OMNIPAQUE IOHEXOL 350 MG/ML SOLN  Comparison: 03/10/2012  Findings: Diffuse emphysematous changes are noted within the lungs bilaterally.  There is a persistent right upper lobe mass lesion identified which measures approximately 6.9 x 5.6 cm.  This is slightly decreased in size when compared with the prior exam. It causes some narrowing of the bronchial tree on the right and extends into the hilum as previously described.  The thoracic aorta is well visualized within normal branching pattern.  No findings to suggest aneurysm or aortic dissection are seen.  The pulmonary artery is also well visualized and demonstrates a normal enhancement pattern.  The arterial branches in the right upper lobe are somewhat displaced by the patient's known mass lesion.  No filling defects to suggest pulmonary emboli are identified bilaterally.  Degenerative changes of the thoracic spine are seen.  No acute bony abnormality is noted.  IMPRESSION: No evidence of pulmonary emboli.  Persistent right upper lobe mass lesion which is slightly decreased in size when compared with the prior exam.  No new focal abnormality is noted.   Original Report Authenticated By: Alcide Clever, M.D.    Dg Esophagus  04/30/2012  *RADIOLOGY REPORT*  Clinical Data: Difficulty swallowing.  History of lung cancer and radiation therapy.  ESOPHOGRAM/BARIUM SWALLOW  Technique:  Single contrast examination was performed using thin barium.  Fluoroscopy time:  1.11 minutes.  Comparison:  Plain films of the chest this same day and CT chest 04/10/2012.  Findings:  A double contrast study was initially attempted but the patient could not tolerate the study.   Therefore, a single contrast exam was performed.  No evidence inflammatory process or mass is seen in the esophagus on this limited exam.  A 13 mm barium time was given to the patient became lodged in the upper esophagus at the level  of the clavicular heads.  IMPRESSION: Findings compatible with proximal esophageal stricture.   Original Report Authenticated By: Holley Dexter, M.D.     Microbiology: Recent Results (from the past 240 hour(s))  TECHNOLOGIST REVIEW     Status: None   Collection Time    04/28/12  1:38 PM      Result Value Range Status   Technologist Review rare meta   Final     Labs: Basic Metabolic Panel:  Recent Labs Lab 04/30/12 1240 05/01/12 0525  NA 132* 133*  K 4.0 4.3  CL 95* 99  CO2 23 23  GLUCOSE 91 123*  BUN 26* 23  CREATININE 0.61 0.64  CALCIUM 9.7 8.9   Liver Function Tests:  Recent Labs Lab 04/30/12 1240  AST 21  ALT 17  ALKPHOS 76  BILITOT 0.4  PROT 6.9  ALBUMIN 3.6   No results found for this basename: LIPASE, AMYLASE,  in the last 168 hours No results found for this basename: AMMONIA,  in the last 168 hours CBC:  Recent Labs Lab 04/30/12 1240 05/01/12 0525 05/05/12 1405  WBC 10.2 7.0 6.5  NEUTROABS 8.4*  --   --   HGB 11.2* 10.6* 9.9*  HCT 33.8* 31.4* 30.2*  MCV 100.9* 101.6* 100.7*  PLT 87* 77* 100*   Cardiac Enzymes: No results found for this basename: CKTOTAL, CKMB, CKMBINDEX, TROPONINI,  in the last 168 hours BNP: BNP (last 3 results) No results found for this basename: PROBNP,  in the last 8760 hours CBG: No results found for this basename: GLUCAP,  in the last 168 hours     Signed:  DHUNGEL, NISHANT  [facility] 05/06/2012, 10:27 AM

## 2012-05-06 NOTE — Progress Notes (Signed)
Patient discharged to home.  Reviewed discharge instructions with patient.  Case management consulted to help obtain medications.  IV removed from left forearm.  Patient without questions at this time.  Patient escorted off the floor via wheelchair.  Patient discharged.

## 2012-05-07 ENCOUNTER — Encounter: Payer: Self-pay | Admitting: Internal Medicine

## 2012-05-07 NOTE — Progress Notes (Signed)
Put disability form on nurse's desk. °

## 2012-05-12 ENCOUNTER — Other Ambulatory Visit (HOSPITAL_BASED_OUTPATIENT_CLINIC_OR_DEPARTMENT_OTHER): Payer: Medicaid Other

## 2012-05-12 ENCOUNTER — Encounter: Payer: Self-pay | Admitting: Internal Medicine

## 2012-05-12 ENCOUNTER — Encounter (INDEPENDENT_AMBULATORY_CARE_PROVIDER_SITE_OTHER): Payer: Medicaid Other | Admitting: Ophthalmology

## 2012-05-12 DIAGNOSIS — H3530 Unspecified macular degeneration: Secondary | ICD-10-CM

## 2012-05-12 DIAGNOSIS — C3410 Malignant neoplasm of upper lobe, unspecified bronchus or lung: Secondary | ICD-10-CM

## 2012-05-12 DIAGNOSIS — H33009 Unspecified retinal detachment with retinal break, unspecified eye: Secondary | ICD-10-CM

## 2012-05-12 DIAGNOSIS — H353290 Exudative age-related macular degeneration, unspecified eye, stage unspecified: Secondary | ICD-10-CM

## 2012-05-12 DIAGNOSIS — C3490 Malignant neoplasm of unspecified part of unspecified bronchus or lung: Secondary | ICD-10-CM

## 2012-05-12 DIAGNOSIS — H43819 Vitreous degeneration, unspecified eye: Secondary | ICD-10-CM

## 2012-05-12 DIAGNOSIS — H2510 Age-related nuclear cataract, unspecified eye: Secondary | ICD-10-CM

## 2012-05-12 LAB — COMPREHENSIVE METABOLIC PANEL (CC13)
AST: 19 U/L (ref 5–34)
Alkaline Phosphatase: 90 U/L (ref 40–150)
BUN: 20.6 mg/dL (ref 7.0–26.0)
Creatinine: 0.7 mg/dL (ref 0.6–1.1)
Potassium: 3.7 mEq/L (ref 3.5–5.1)
Total Bilirubin: 0.3 mg/dL (ref 0.20–1.20)

## 2012-05-12 LAB — CBC WITH DIFFERENTIAL/PLATELET
BASO%: 0.1 % (ref 0.0–2.0)
EOS%: 0 % (ref 0.0–7.0)
HCT: 33 % — ABNORMAL LOW (ref 34.8–46.6)
LYMPH%: 3.9 % — ABNORMAL LOW (ref 14.0–49.7)
MCH: 34.1 pg — ABNORMAL HIGH (ref 25.1–34.0)
MCHC: 33 g/dL (ref 31.5–36.0)
NEUT%: 90.8 % — ABNORMAL HIGH (ref 38.4–76.8)
lymph#: 0.4 10*3/uL — ABNORMAL LOW (ref 0.9–3.3)

## 2012-05-12 NOTE — Progress Notes (Signed)
Faxed disability form to [employer] @ [phone].

## 2012-05-20 ENCOUNTER — Encounter: Payer: Self-pay | Admitting: Radiation Oncology

## 2012-05-23 ENCOUNTER — Ambulatory Visit (HOSPITAL_COMMUNITY)
Admission: RE | Admit: 2012-05-23 | Discharge: 2012-05-23 | Disposition: A | Discharge status: Home / Self Care | Payer: Self-pay | Source: Ambulatory Visit | Attending: Internal Medicine | Admitting: Internal Medicine

## 2012-05-23 ENCOUNTER — Encounter (HOSPITAL_COMMUNITY): Payer: Self-pay

## 2012-05-23 DIAGNOSIS — K8020 Calculus of gallbladder without cholecystitis without obstruction: Secondary | ICD-10-CM | POA: Insufficient documentation

## 2012-05-23 DIAGNOSIS — C7931 Secondary malignant neoplasm of brain: Secondary | ICD-10-CM | POA: Insufficient documentation

## 2012-05-23 DIAGNOSIS — C3490 Malignant neoplasm of unspecified part of unspecified bronchus or lung: Secondary | ICD-10-CM | POA: Insufficient documentation

## 2012-05-23 HISTORY — DX: Secondary malignant neoplasm of brain: C79.31

## 2012-05-23 MED ORDER — IOHEXOL 300 MG/ML  SOLN
100.0000 mL | Freq: Once | INTRAMUSCULAR | Status: AC | PRN
  Administered 2012-05-23: 100 mL via INTRAVENOUS

## 2012-05-26 ENCOUNTER — Ambulatory Visit
Admit: 2012-05-26 | Discharge: 2012-05-26 | Disposition: A | Discharge status: Home / Self Care | Payer: PRIVATE HEALTH INSURANCE | Attending: Radiation Oncology | Admitting: Radiation Oncology

## 2012-05-26 ENCOUNTER — Encounter: Payer: Self-pay | Admitting: Radiation Oncology

## 2012-05-26 ENCOUNTER — Telehealth: Payer: Self-pay

## 2012-05-26 ENCOUNTER — Telehealth: Payer: Self-pay | Admitting: Internal Medicine

## 2012-05-26 ENCOUNTER — Encounter: Payer: Self-pay | Admitting: Internal Medicine

## 2012-05-26 ENCOUNTER — Other Ambulatory Visit (HOSPITAL_BASED_OUTPATIENT_CLINIC_OR_DEPARTMENT_OTHER): Payer: Medicaid Other | Admitting: Lab

## 2012-05-26 ENCOUNTER — Ambulatory Visit (HOSPITAL_BASED_OUTPATIENT_CLINIC_OR_DEPARTMENT_OTHER): Payer: Medicaid Other | Admitting: Internal Medicine

## 2012-05-26 ENCOUNTER — Telehealth: Payer: Self-pay | Admitting: *Deleted

## 2012-05-26 ENCOUNTER — Ambulatory Visit (HOSPITAL_BASED_OUTPATIENT_CLINIC_OR_DEPARTMENT_OTHER): Payer: Medicaid Other

## 2012-05-26 VITALS — BP 129/81 | HR 98 | Temp 97.6°F | Resp 20 | Wt 106.4 lb

## 2012-05-26 DIAGNOSIS — C3410 Malignant neoplasm of upper lobe, unspecified bronchus or lung: Secondary | ICD-10-CM

## 2012-05-26 DIAGNOSIS — Z5111 Encounter for antineoplastic chemotherapy: Secondary | ICD-10-CM

## 2012-05-26 DIAGNOSIS — C7949 Secondary malignant neoplasm of other parts of nervous system: Secondary | ICD-10-CM

## 2012-05-26 DIAGNOSIS — J449 Chronic obstructive pulmonary disease, unspecified: Secondary | ICD-10-CM

## 2012-05-26 DIAGNOSIS — R918 Other nonspecific abnormal finding of lung field: Secondary | ICD-10-CM

## 2012-05-26 DIAGNOSIS — J441 Chronic obstructive pulmonary disease with (acute) exacerbation: Secondary | ICD-10-CM

## 2012-05-26 DIAGNOSIS — C3490 Malignant neoplasm of unspecified part of unspecified bronchus or lung: Secondary | ICD-10-CM

## 2012-05-26 DIAGNOSIS — D696 Thrombocytopenia, unspecified: Secondary | ICD-10-CM

## 2012-05-26 DIAGNOSIS — C7931 Secondary malignant neoplasm of brain: Secondary | ICD-10-CM

## 2012-05-26 LAB — COMPREHENSIVE METABOLIC PANEL (CC13)
Albumin: 2.9 g/dL — ABNORMAL LOW (ref 3.5–5.0)
Alkaline Phosphatase: 78 U/L (ref 40–150)
BUN: 15.7 mg/dL (ref 7.0–26.0)
CO2: 27 mEq/L (ref 22–29)
Glucose: 104 mg/dl — ABNORMAL HIGH (ref 70–99)
Total Bilirubin: 0.21 mg/dL (ref 0.20–1.20)

## 2012-05-26 LAB — CBC WITH DIFFERENTIAL/PLATELET
Basophils Absolute: 0 10*3/uL (ref 0.0–0.1)
EOS%: 0.1 % (ref 0.0–7.0)
HCT: 30.2 % — ABNORMAL LOW (ref 34.8–46.6)
HGB: 9.5 g/dL — ABNORMAL LOW (ref 11.6–15.9)
MCH: 33.7 pg (ref 25.1–34.0)
MCV: 107.1 fL — ABNORMAL HIGH (ref 79.5–101.0)
MONO%: 8.4 % (ref 0.0–14.0)
NEUT%: 84.8 % — ABNORMAL HIGH (ref 38.4–76.8)
Platelets: 95 10*3/uL — ABNORMAL LOW (ref 145–400)

## 2012-05-26 LAB — TECHNOLOGIST REVIEW

## 2012-05-26 MED ORDER — SODIUM CHLORIDE 0.9 % IV SOLN
375.0000 mg/m2 | Freq: Once | INTRAVENOUS | Status: AC
  Administered 2012-05-26: 525 mg via INTRAVENOUS
  Filled 2012-05-26: qty 21

## 2012-05-26 MED ORDER — FOLIC ACID 1 MG PO TABS: 1.0000 mg | ORAL_TABLET | Freq: Every day | ORAL | Status: DC

## 2012-05-26 MED ORDER — IPRATROPIUM BROMIDE 0.02 % IN SOLN: 0.5000 mg | RESPIRATORY_TRACT | Status: DC | PRN

## 2012-05-26 MED ORDER — SODIUM CHLORIDE 0.9 % IV SOLN
Freq: Once | INTRAVENOUS | Status: AC
  Administered 2012-05-26: 14:00:00 via INTRAVENOUS

## 2012-05-26 MED ORDER — DEXAMETHASONE SODIUM PHOSPHATE 4 MG/ML IJ SOLN
20.0000 mg | Freq: Once | INTRAMUSCULAR | Status: AC
  Administered 2012-05-26: 20 mg via INTRAVENOUS

## 2012-05-26 MED ORDER — CYANOCOBALAMIN 1000 MCG/ML IJ SOLN
1000.0000 ug | Freq: Once | INTRAMUSCULAR | Status: AC
  Administered 2012-05-26: 1000 ug via INTRAMUSCULAR

## 2012-05-26 MED ORDER — ONDANSETRON 16 MG/50ML IVPB (CHCC)
16.0000 mg | Freq: Once | INTRAVENOUS | Status: AC
  Administered 2012-05-26: 16 mg via INTRAVENOUS

## 2012-05-26 MED ORDER — ALBUTEROL SULFATE (5 MG/ML) 0.5% IN NEBU: 2.5000 mg | INHALATION_SOLUTION | RESPIRATORY_TRACT | Status: DC | PRN

## 2012-05-26 MED ORDER — SODIUM CHLORIDE 0.9 % IV SOLN
347.6000 mg | Freq: Once | INTRAVENOUS | Status: AC
  Administered 2012-05-26: 350 mg via INTRAVENOUS
  Filled 2012-05-26: qty 35

## 2012-05-26 NOTE — Progress Notes (Signed)
Verbal order received and read back, ok to proceed with treatment today despite PLT 95, per [name].

## 2012-05-26 NOTE — Telephone Encounter (Signed)
Albuterol prescription called into [facility].Called patient to confirm pick up and she returned my call and informed me that she did pick up medication  after completion of chemotherapy.

## 2012-05-26 NOTE — Patient Instructions (Signed)
[facility] Discharge Instructions for Patients Receiving Chemotherapy  Today you received the following chemotherapy agents: Alimta, Carboplatin  To help prevent nausea and vomiting after your treatment, we encourage you to take your nausea medication as directed by your MD. If you develop nausea and vomiting that is not controlled by your nausea medication, call the clinic. If it is after clinic hours your family physician or the after hours number for the clinic or go to the Emergency Department.   BELOW ARE SYMPTOMS THAT SHOULD BE REPORTED IMMEDIATELY:  *FEVER GREATER THAN 100.5 F  *CHILLS WITH OR WITHOUT FEVER  NAUSEA AND VOMITING THAT IS NOT CONTROLLED WITH YOUR NAUSEA MEDICATION  *UNUSUAL SHORTNESS OF BREATH  *UNUSUAL BRUISING OR BLEEDING  TENDERNESS IN MOUTH AND THROAT WITH OR WITHOUT PRESENCE OF ULCERS  *URINARY PROBLEMS  *BOWEL PROBLEMS  UNUSUAL RASH Items with * indicate a potential emergency and should be followed up as soon as possible   Feel free to call the clinic you have any questions or concerns. The clinic phone number is [phone].

## 2012-05-26 NOTE — Progress Notes (Signed)
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 Venetia Liu from neurosurgery.  The recent films were presented in our multidisciplinary conference with neuroradiology just prior to the clinic.  She is without complaint.                              ALLERGIES:  is allergic to levaquin.  Meds: Current Outpatient Prescriptions  Medication Sig Dispense Refill  . acyclovir (ZOVIRAX) 400 MG tablet Take 1 tablet (400 mg total) by mouth 5 (five) times daily.  80 tablet  0  . albuterol (PROVENTIL) (5 MG/ML) 0.5% nebulizer solution Take 0.5 mLs (2.5 mg total) by nebulization every 4 (four) hours as needed for wheezing or shortness of breath.  20 mL  5  . calcium-vitamin D (OSCAL WITH D) 500-200 MG-UNIT per tablet Take 1 tablet by mouth daily.      . chlorpheniramine-HYDROcodone (TUSSIONEX PENNKINETIC ER) 10-8 MG/5ML LQCR Take 5 mLs by mouth every 12 (twelve) hours as needed. [facility]. [phone]      . emollient (BIAFINE) cream Apply topically 2 (two) times daily.      [name] glucosamine-chondroitin 500-400 MG tablet Take 1 tablet by mouth daily.       [name] ipratropium (ATROVENT) 0.02 % nebulizer solution Take 2.5 mLs (0.5 mg total) by nebulization every 4 (four) hours as needed (pt mixes with albuterol).  75 mL  5  . omeprazole (PRILOSEC) 40 MG capsule Take 1 capsule (40 mg total) by mouth  daily.  30 capsule  0  . oxyCODONE-acetaminophen (PERCOCET/ROXICET) 5-325 MG per tablet Take 1-2 tablets by mouth every 4 (four) hours as needed for pain.  120 tablet  0  . polyethylene glycol (MIRALAX / GLYCOLAX) packet Take 17 g by mouth daily.  14 each  3  . PRESCRIPTION MEDICATION Place into the left eye every 30 (thirty) days. Bevacizumab (Avastin) inj into the left eye      . vitamin E 400 UNIT capsule Take 400 Units by mouth daily.      . Alum & Mag Hydroxide-Simeth (GI COCKTAIL) SUSP suspension Take 30 mLs by mouth 2 (two) times daily with a meal. Shake well.  300 mL  0  . alum & mag hydroxide-simeth (MAALOX MAX) 400-400-40 MG/5ML suspension Take 5 mLs by mouth every 6 (six) hours as needed for indigestion.  355 mL  0  . dexamethasone (DECADRON) 4 MG tablet Take 4 mg by mouth 2 (two) times daily.      . folic acid (FOLVITE) 1 MG tablet Take 1 tablet (1 mg total) by mouth daily.  30 tablet  5  . guaiFENesin (ROBITUSSIN) 100 MG/5ML SOLN Take 20 mLs (400 mg total) by mouth every 6 (six) hours as needed.  1200 mL  0  .  PRESCRIPTION MEDICATION [facility]. Last treatment was on 04-10-12. Pt's on an every 3 week cycle. Pt was supposed to have a chemo treatment on Monday  04-28-12 but that treatment was cancelled. Followed by Dr Melanie Liu.      . sucralfate (CARAFATE) 1 G tablet Take 1 tablet (1 g total) by mouth 3 (three) times a week. 5 min before food  90 tablet  0   No current facility-administered medications for this encounter.    Physical Findings: The patient is in no acute distress. Patient is alert and oriented.  weight is 106 lb 6.4 oz (48.263 kg). Her oral temperature is 97.6 F (36.4 C). Her blood pressure is 129/81 and her pulse is 98. Her respiration is 20 and oxygen saturation is 100%. .  No significant changes.  Radiographic Findings: Dg Chest 2 View  04/30/2012  *RADIOLOGY REPORT*  Clinical Data: Difficulty swallowing.  Lung cancer.  CHEST - 2 VIEW  Comparison: CT chest  and chest radiograph 04/10/2012.  Findings: Trachea is midline.  Heart size normal.  Right upper lobe mass again noted.  Lungs are emphysematous.  No pleural fluid.  IMPRESSION: Right upper lobe mass.  No superimposed acute findings.   Original Report Authenticated By: Melanie Liu, M.D.    Ct Chest W Contrast  05/23/2012  *RADIOLOGY REPORT*  Clinical Data:  Lung cancer with brain metastases diagnosed 11/2011, chemotherapy in progress, XRT complete  CT CHEST, ABDOMEN AND PELVIS WITH CONTRAST  Technique:  Multidetector CT imaging of the chest, abdomen and pelvis was performed following the standard protocol during bolus administration of intravenous contrast.  Contrast: OMNIPAQUE IOHEXOL 300 MG/ML  SOLN  Comparison:  CTA chest dated 04/10/2012.  PET CT dated 11/14/2011.  CT CHEST  Findings:  5.2 x 6.2 cm posterior right upper lobe mass (series 2/image 15), corresponding to known lung cancer, previously 5.6 x 6.9 cm.  Mass/opacity extends to the right perihilar region (series 2/image 22).  Underlying extensive centrilobular and paraseptal emphysematous changes.  No pleural effusion or pneumothorax.  The visualized thyroid is unremarkable.  Heart is normal in size.  No pericardial effusion.  Atherosclerotic calcifications of the aortic arch.  No suspicious mediastinal, left hilar, or axillary lymphadenopathy.  Degenerative changes of the thoracic spine.  Lower cervical spine fixation hardware.  IMPRESSION: 6.2 cm posterior right upper lobe mass, corresponding to known lung cancer, mildly decreased.  CT ABDOMEN AND PELVIS  Findings:  Liver, spleen, pancreas, and adrenal glands are within normal limits.  1.8 cm gallstone (series 2/image 64). 1.8 x 1.9 cm fluid density lesion arising along the posterior fundal wall (series 2/image 71), of uncertain etiology (possibly fundal adenomyomatosis) but unlikely to be of clinical significance, not definitely visualized on prior PET CT.  Kidneys are within normal limits.   No hydronephrosis.  No evidence of bowel obstruction.  Normal appendix.  Atherosclerotic calcifications of the abdominal aorta and branch vessels.  No abdominopelvic ascites.  No suspicious abdominopelvic lymphadenopathy.  Status post hysterectomy.  No adnexal masses.  Bladder is within normal limits.  Degenerative changes of the lumbar spine.  IMPRESSION: No evidence of metastatic disease in the abdomen/pelvis.  Cholelithiasis.  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   Original Report Authenticated By: Charline Bills, M.D.    Ct Abdomen Pelvis W Contrast  05/23/2012  *RADIOLOGY REPORT*  Clinical Data:  Lung cancer with brain metastases diagnosed 11/2011, chemotherapy in progress, XRT complete  CT CHEST, ABDOMEN AND PELVIS WITH CONTRAST  Technique:  Multidetector CT  imaging of the chest, abdomen and pelvis was performed following the standard protocol during bolus administration of intravenous contrast.  Contrast: OMNIPAQUE IOHEXOL 300 MG/ML  SOLN  Comparison:  CTA chest dated 04/10/2012.  PET CT dated 11/14/2011.  CT CHEST  Findings:  5.2 x 6.2 cm posterior right upper lobe mass (series 2/image 15), corresponding to known lung cancer, previously 5.6 x 6.9 cm.  Mass/opacity extends to the right perihilar region (series 2/image 22).  Underlying extensive centrilobular and paraseptal emphysematous changes.  No pleural effusion or pneumothorax.  The visualized thyroid is unremarkable.  Heart is normal in size.  No pericardial effusion.  Atherosclerotic calcifications of the aortic arch.  No suspicious mediastinal, left hilar, or axillary lymphadenopathy.  Degenerative changes of the thoracic spine.  Lower cervical spine fixation hardware.  IMPRESSION: 6.2 cm posterior right upper lobe mass, corresponding to known lung cancer, mildly decreased.  CT ABDOMEN AND PELVIS  Findings:  Liver, spleen, pancreas, and adrenal glands are within normal limits.  1.8 cm gallstone (series 2/image 64). 1.8 x 1.9 cm fluid density  lesion arising along the posterior fundal wall (series 2/image 71), of uncertain etiology (possibly fundal adenomyomatosis) but unlikely to be of clinical significance, not definitely visualized on prior PET CT.  Kidneys are within normal limits.  No hydronephrosis.  No evidence of bowel obstruction.  Normal appendix.  Atherosclerotic calcifications of the abdominal aorta and branch vessels.  No abdominopelvic ascites.  No suspicious abdominopelvic lymphadenopathy.  Status post hysterectomy.  No adnexal masses.  Bladder is within normal limits.  Degenerative changes of the lumbar spine.  IMPRESSION: No evidence of metastatic disease in the abdomen/pelvis.  Cholelithiasis.  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   Original Report Authenticated By: Charline Bills, M.D.    Dg Esophagus  04/30/2012  *RADIOLOGY REPORT*  Clinical Data: Difficulty swallowing.  History of lung cancer and radiation therapy.  ESOPHOGRAM/BARIUM SWALLOW  Technique:  Single contrast examination was performed using thin barium.  Fluoroscopy time:  1.11 minutes.  Comparison:  Plain films of the chest this same day and CT chest 04/10/2012.  Findings:  A double contrast study was initially attempted but the patient could not tolerate the study.  Therefore, a single contrast exam was performed.  No evidence inflammatory process or mass is seen in the esophagus on this limited exam.  A 13 mm barium time was given to the patient became lodged in the upper esophagus at the level of the clavicular heads.  IMPRESSION: Findings compatible with proximal esophageal stricture.   Original Report Authenticated By: Holley Dexter, M.D.    Impression:  The patient is recovering from the effects of radiation.  Plan:  Today, I told her to taper to 2 mg dex daily and quit next Monday.  Out of courtesy, I refilled the Albuterol and Atrovent prescribed by the hospitalists.  _____________________________________  Artist Pais Kathrynn Running, M.D. and  Maeola Harman,  M.D.

## 2012-05-26 NOTE — Progress Notes (Signed)
[facility] [phone]) [phone]   Fax:(336) [phone]  OFFICE PROGRESS NOTE  Melanie Hector, MD [address] [place] [address]  DIAGNOSIS: Metastatic non-small cell lung cancer, adenocarcinoma with negative EGFR mutation and negative ALK gene translocation diagnosed in September of 2013 with metastatic single brain lesion.   PRIOR THERAPY:  1) Status post stereotactic radiosurgery to the single brain lesion under the care Dr. Kathrynn Running.  2) Concurrent chemoradiation with chemotherapy in the form of weekly carboplatin for an AUC of 2 and paclitaxel at 45 mg per meter squared concurrent with radiation therapy under the care Dr. Kathrynn Running with partial response in her disease   CURRENT THERAPY: Systemic chemotherapy with carboplatin for AUC of 5 and Alimta 500 mg/M2 every 3 weeks. Status post 2 cycles  INTERVAL HISTORY: Melanie Liu 62 y.o. female returns to the clinic today for routine followup visit. The patient is feeling fine today with no specific complaints. She denied having any significant chest pain, shortness breath, cough or hemoptysis. She denied having any bleeding issues. She has no weight loss or night sweats. She tolerated the second cycle of her chemotherapy fairly well but her treatment has been on hold because of thrombocytopenia. She had repeat CT scan of the chest, abdomen and pelvis performed recently and she is here for evaluation and discussion of her scan results.  MEDICAL HISTORY: Past Medical History  Diagnosis Date  . Nodule of right lung     right upper lobe  . Anemia   . Rectovaginal fistula   . Right frontal lobe lesion 12/13/11    CT head  . History of radiation therapy 12/24/11-02/08/12    rul 66Gy/86fxs  . Dysphagia   . History of radiation therapy 12/27/11    SRS rt ant frontal 20gy  . Lung cancer 11/23/11    biopsy-right  . Brain metastases     ALLERGIES:  is allergic to levaquin.  MEDICATIONS:  Current Outpatient  Prescriptions  Medication Sig Dispense Refill  . acyclovir (ZOVIRAX) 400 MG tablet Take 1 tablet (400 mg total) by mouth 5 (five) times daily.  80 tablet  0  . Alum & Mag Hydroxide-Simeth (GI COCKTAIL) SUSP suspension Take 30 mLs by mouth 2 (two) times daily with a meal. Shake well.  300 mL  0  . alum & mag hydroxide-simeth (MAALOX MAX) 400-400-40 MG/5ML suspension Take 5 mLs by mouth every 6 (six) hours as needed for indigestion.  355 mL  0  . calcium-vitamin D (OSCAL WITH D) 500-200 MG-UNIT per tablet Take 1 tablet by mouth daily.      . chlorpheniramine-HYDROcodone (TUSSIONEX PENNKINETIC ER) 10-8 MG/5ML LQCR Take 5 mLs by mouth every 12 (twelve) hours as needed. [facility]. [phone]      . dexamethasone (DECADRON) 4 MG tablet Take 4 mg by mouth 2 (two) times daily.      [name] emollient (BIAFINE) cream Apply topically 2 (two) times daily.      [name] glucosamine-chondroitin 500-400 MG tablet Take 1 tablet by mouth daily.       [name] guaiFENesin (ROBITUSSIN) 100 MG/5ML SOLN Take 20 mLs (400 mg total) by mouth every 6 (six) hours as needed.  1200 mL  0  . ipratropium (ATROVENT) 0.02 % nebulizer solution Take 2.5 mLs (0.5 mg total) by nebulization every 4 (four) hours as needed (pt mixes with albuterol).  75 mL  5  . omeprazole (PRILOSEC) 40 MG capsule Take 1  capsule (40 mg total) by mouth daily.  30 capsule  0  . oxyCODONE-acetaminophen (PERCOCET/ROXICET) 5-325 MG per tablet Take 1-2 tablets by mouth every 4 (four) hours as needed for pain.  120 tablet  0  . polyethylene glycol (MIRALAX / GLYCOLAX) packet Take 17 g by mouth daily.  14 each  3  . PRESCRIPTION MEDICATION Place into the left eye every 30 (thirty) days. Bevacizumab (Avastin) inj into the left eye      . PRESCRIPTION MEDICATION [facility]. Last treatment was on 04-10-12. Pt's on an every 3 week cycle. Pt was supposed to have a chemo treatment on Monday  04-28-12 but that treatment was cancelled. Followed by Dr  Legrand Como.      . sucralfate (CARAFATE) 1 G tablet Take 1 tablet (1 g total) by mouth 3 (three) times a week. 5 min before food  90 tablet  0  . vitamin E 400 UNIT capsule Take 400 Units by mouth daily.      [name] albuterol (PROVENTIL) (5 MG/ML) 0.5% nebulizer solution Take 0.5 mLs (2.5 mg total) by nebulization every 4 (four) hours as needed for wheezing or shortness of breath.  20 mL  5  . folic acid (FOLVITE) 1 MG tablet Take 1 tablet (1 mg total) by mouth daily.  30 tablet  5   No current facility-administered medications for this visit.    SURGICAL HISTORY:  Past Surgical History  Procedure Laterality Date  . Partial hysterectomy    . Hemmorhoid surgery    . Neck surgery    . Retinal detachment surgery    . Esophagogastroduodenoscopy N/A 05/01/2012    Procedure: ESOPHAGOGASTRODUODENOSCOPY (EGD);  Surgeon: Shirley Friar, MD;  Location: [facility];  Service: Endoscopy;  Laterality: N/A;    REVIEW OF SYSTEMS:  A comprehensive review of systems was negative.   PHYSICAL EXAMINATION: General appearance: alert, cooperative and no distress Head: Normocephalic, without obvious abnormality, atraumatic Neck: no adenopathy Lymph nodes: Cervical, supraclavicular, and axillary nodes normal. Resp: clear to auscultation bilaterally Cardio: regular rate and rhythm, S1, S2 normal, no murmur, click, rub or gallop GI: soft, non-tender; bowel sounds normal; no masses,  no organomegaly Extremities: extremities normal, atraumatic, no cyanosis or edema Neurologic: Alert and oriented X 3, normal strength and tone. Normal symmetric reflexes. Normal coordination and gait  ECOG PERFORMANCE STATUS: 1 - Symptomatic but completely ambulatory  Blood pressure 121/91, pulse 94, temperature 97.8 F (36.6 C), temperature source Oral, resp. rate 20, height 5\' 2"  (1.575 m), weight 106 lb 4.8 oz (48.217 kg).  LABORATORY DATA: Lab Results  Component Value Date   WBC 8.5 05/26/2012   HGB 9.5* 05/26/2012   HCT  30.2* 05/26/2012   MCV 107.1* 05/26/2012   PLT 95* 05/26/2012      Chemistry      Component Value Date/Time   NA 139 05/26/2012 1020   NA 133* 05/01/2012 0525   K 4.4 05/26/2012 1020   K 4.3 05/01/2012 0525   CL 103 05/26/2012 1020   CL 99 05/01/2012 0525   CO2 27 05/26/2012 1020   CO2 23 05/01/2012 0525   BUN 15.7 05/26/2012 1020   BUN 23 05/01/2012 0525   CREATININE 0.7 05/26/2012 1020   CREATININE 0.64 05/01/2012 0525      Component Value Date/Time   CALCIUM 9.5 05/26/2012 1020   CALCIUM 8.9 05/01/2012 0525   ALKPHOS 78 05/26/2012 1020   ALKPHOS 76 04/30/2012 1240   AST 17 05/26/2012 1020  AST 21 04/30/2012 1240   ALT 17 05/26/2012 1020   ALT 17 04/30/2012 1240   BILITOT 0.21 05/26/2012 1020   BILITOT 0.4 04/30/2012 1240       RADIOGRAPHIC STUDIES: Dg Chest 2 View  04/30/2012  *RADIOLOGY REPORT*  Clinical Data: Difficulty swallowing.  Lung cancer.  CHEST - 2 VIEW  Comparison: CT chest and chest radiograph 04/10/2012.  Findings: Trachea is midline.  Heart size normal.  Right upper lobe mass again noted.  Lungs are emphysematous.  No pleural fluid.  IMPRESSION: Right upper lobe mass.  No superimposed acute findings.   Original Report Authenticated By: Leanna Battles, M.D.    Ct Chest W Contrast  05/23/2012  *RADIOLOGY REPORT*  Clinical Data:  Lung cancer with brain metastases diagnosed 11/2011, chemotherapy in progress, XRT complete  CT CHEST, ABDOMEN AND PELVIS WITH CONTRAST  Technique:  Multidetector CT imaging of the chest, abdomen and pelvis was performed following the standard protocol during bolus administration of intravenous contrast.  Contrast: OMNIPAQUE IOHEXOL 300 MG/ML  SOLN  Comparison:  CTA chest dated 04/10/2012.  PET CT dated 11/14/2011.   CT CHEST  Findings:  5.2 x 6.2 cm posterior right upper lobe mass (series 2/image 15), corresponding to known lung cancer, previously 5.6 x 6.9 cm.  Mass/opacity extends to the right perihilar region (series 2/image 22).  Underlying  extensive centrilobular and paraseptal emphysematous changes.  No pleural effusion or pneumothorax.  The visualized thyroid is unremarkable.  Heart is normal in size.  No pericardial effusion.  Atherosclerotic calcifications of the aortic arch.  No suspicious mediastinal, left hilar, or axillary lymphadenopathy.  Degenerative changes of the thoracic spine.  Lower cervical spine fixation hardware.  IMPRESSION: 6.2 cm posterior right upper lobe mass, corresponding to known lung cancer, mildly decreased.   CT ABDOMEN AND PELVIS  Findings:  Liver, spleen, pancreas, and adrenal glands are within normal limits.  1.8 cm gallstone (series 2/image 64). 1.8 x 1.9 cm fluid density lesion arising along the posterior fundal wall (series 2/image 71), of uncertain etiology (possibly fundal adenomyomatosis) but unlikely to be of clinical significance, not definitely visualized on prior PET CT.  Kidneys are within normal limits.  No hydronephrosis.  No evidence of bowel obstruction.  Normal appendix.  Atherosclerotic calcifications of the abdominal aorta and branch vessels.  No abdominopelvic ascites.  No suspicious abdominopelvic lymphadenopathy.  Status post hysterectomy.  No adnexal masses.  Bladder is within normal limits.  Degenerative changes of the lumbar spine.  IMPRESSION: No evidence of metastatic disease in the abdomen/pelvis.  Cholelithiasis.  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   Original Report Authenticated By: Charline Bills, M.D.      ASSESSMENT: This is a very pleasant 62 years old white female with history of metastatic non-small cell lung cancer, adenocarcinoma status post 2 cycles of systemic chemotherapy with carboplatin and Alimta with some improvement in her disease.   PLAN: I discussed the scan results with the patient today. I recommended for her to continue on systemic chemotherapy with carboplatin and Alimta but I would reduce the dose of carboplatin to AUC of 4 and Alimta 375 mg/M2 this cycle  secondary to low platelets count. She would come back for followup visit in 3 weeks with the next cycle of her treatment. The patient was advised to call immediately if she has any concerning symptoms in the interval.  All questions were answered. The patient knows to call the clinic with any problems, questions or concerns. We can certainly see the patient  much sooner if necessary.  I spent 15 minutes counseling the patient face to face. The total time spent in the appointment was 25 minutes.

## 2012-05-26 NOTE — Telephone Encounter (Signed)
gv and printed appt schedule for pt for March and April....emailed michelle to add tx.Marland KitchenMarland KitchenMarland KitchenDr MM getting todays tx added

## 2012-05-26 NOTE — Telephone Encounter (Signed)
Per staff message and POF I have scheduled appts.  [name]  

## 2012-05-26 NOTE — Progress Notes (Signed)
Prescription for albuterol/proventil (5mg /ml) 0.5 % nebulizer called into [facility],[place] [phone] to [facility].Will have to call out to get this as not readily available at this location.Will try to call patient again to inform that prescription has been called in.

## 2012-05-26 NOTE — Progress Notes (Signed)
patien t here f/u sp srs brain mets/lung ca, alert,oriented x3, no headaches, blurred vision gets sob with activity,100Room air sats, on decadron 2mg  2x day now, no thrush seen, appetite good, was d/c hospital 05/06/12 acute esophagitis 2ndary herpes simplex, on carafte an, no nasea stated, no difficulty swallowing now, one in a while drinks ensure, legs are weak, wakes up with legs cramping, results of ct 05/23/12 and last MRI 03/28/12 '9:14 AM  9:14 AM

## 2012-05-26 NOTE — Patient Instructions (Signed)
You have some improvement in her disease after the first 2 cycles of the chemotherapy. Continue chemotherapy today as scheduled but I would reduce the dose secondary to low platelets count.

## 2012-06-02 ENCOUNTER — Other Ambulatory Visit (HOSPITAL_BASED_OUTPATIENT_CLINIC_OR_DEPARTMENT_OTHER): Payer: Medicaid Other | Admitting: Lab

## 2012-06-02 DIAGNOSIS — C7949 Secondary malignant neoplasm of other parts of nervous system: Secondary | ICD-10-CM

## 2012-06-02 DIAGNOSIS — C3410 Malignant neoplasm of upper lobe, unspecified bronchus or lung: Secondary | ICD-10-CM

## 2012-06-02 DIAGNOSIS — C3490 Malignant neoplasm of unspecified part of unspecified bronchus or lung: Secondary | ICD-10-CM

## 2012-06-02 LAB — CBC WITH DIFFERENTIAL/PLATELET
BASO%: 0.3 % (ref 0.0–2.0)
Eosinophils Absolute: 0 10*3/uL (ref 0.0–0.5)
HCT: 27.4 % — ABNORMAL LOW (ref 34.8–46.6)
LYMPH%: 16.9 % (ref 14.0–49.7)
MONO#: 0.2 10*3/uL (ref 0.1–0.9)
NEUT#: 2.5 10*3/uL (ref 1.5–6.5)
Platelets: 29 10*3/uL — ABNORMAL LOW (ref 145–400)
RBC: 2.59 10*6/uL — ABNORMAL LOW (ref 3.70–5.45)
WBC: 3.3 10*3/uL — ABNORMAL LOW (ref 3.9–10.3)
lymph#: 0.6 10*3/uL — ABNORMAL LOW (ref 0.9–3.3)

## 2012-06-02 LAB — COMPREHENSIVE METABOLIC PANEL (CC13)
ALT: 20 U/L (ref 0–55)
Albumin: 2.9 g/dL — ABNORMAL LOW (ref 3.5–5.0)
CO2: 26 mEq/L (ref 22–29)
Calcium: 9.3 mg/dL (ref 8.4–10.4)
Chloride: 106 mEq/L (ref 98–107)
Glucose: 83 mg/dl (ref 70–99)
Sodium: 141 mEq/L (ref 136–145)
Total Bilirubin: 0.4 mg/dL (ref 0.20–1.20)
Total Protein: 5.9 g/dL — ABNORMAL LOW (ref 6.4–8.3)

## 2012-06-09 ENCOUNTER — Other Ambulatory Visit (HOSPITAL_BASED_OUTPATIENT_CLINIC_OR_DEPARTMENT_OTHER): Payer: Medicaid Other

## 2012-06-09 DIAGNOSIS — C3410 Malignant neoplasm of upper lobe, unspecified bronchus or lung: Secondary | ICD-10-CM

## 2012-06-09 DIAGNOSIS — C3490 Malignant neoplasm of unspecified part of unspecified bronchus or lung: Secondary | ICD-10-CM

## 2012-06-09 DIAGNOSIS — C7949 Secondary malignant neoplasm of other parts of nervous system: Secondary | ICD-10-CM

## 2012-06-09 LAB — CBC WITH DIFFERENTIAL/PLATELET
BASO%: 0 % (ref 0.0–2.0)
Eosinophils Absolute: 0 10*3/uL (ref 0.0–0.5)
MCHC: 32.7 g/dL (ref 31.5–36.0)
MONO#: 0.5 10*3/uL (ref 0.1–0.9)
NEUT#: 1.6 10*3/uL (ref 1.5–6.5)
RBC: 2.79 10*6/uL — ABNORMAL LOW (ref 3.70–5.45)
RDW: 20.3 % — ABNORMAL HIGH (ref 11.2–14.5)
WBC: 2.5 10*3/uL — ABNORMAL LOW (ref 3.9–10.3)
lymph#: 0.5 10*3/uL — ABNORMAL LOW (ref 0.9–3.3)
nRBC: 1 % — ABNORMAL HIGH (ref 0–0)

## 2012-06-09 LAB — COMPREHENSIVE METABOLIC PANEL (CC13)
ALT: 12 U/L (ref 0–55)
AST: 19 U/L (ref 5–34)
CO2: 26 mEq/L (ref 22–29)
Calcium: 10.4 mg/dL (ref 8.4–10.4)
Chloride: 102 mEq/L (ref 98–107)
Creatinine: 0.7 mg/dL (ref 0.6–1.1)
Sodium: 139 mEq/L (ref 136–145)
Total Protein: 7.4 g/dL (ref 6.4–8.3)

## 2012-06-13 ENCOUNTER — Encounter (INDEPENDENT_AMBULATORY_CARE_PROVIDER_SITE_OTHER): Payer: PRIVATE HEALTH INSURANCE | Admitting: Ophthalmology

## 2012-06-16 ENCOUNTER — Ambulatory Visit (HOSPITAL_BASED_OUTPATIENT_CLINIC_OR_DEPARTMENT_OTHER): Payer: Medicaid Other | Admitting: Physician Assistant

## 2012-06-16 ENCOUNTER — Encounter: Payer: Self-pay | Admitting: Internal Medicine

## 2012-06-16 ENCOUNTER — Encounter: Payer: Self-pay | Admitting: Physician Assistant

## 2012-06-16 ENCOUNTER — Ambulatory Visit (HOSPITAL_BASED_OUTPATIENT_CLINIC_OR_DEPARTMENT_OTHER): Payer: Medicaid Other | Admitting: Lab

## 2012-06-16 ENCOUNTER — Telehealth: Payer: Self-pay | Admitting: Internal Medicine

## 2012-06-16 ENCOUNTER — Telehealth: Payer: Self-pay | Admitting: *Deleted

## 2012-06-16 ENCOUNTER — Ambulatory Visit (HOSPITAL_BASED_OUTPATIENT_CLINIC_OR_DEPARTMENT_OTHER): Payer: Medicaid Other

## 2012-06-16 VITALS — BP 111/79 | HR 112 | Temp 97.1°F | Resp 18 | Ht 62.0 in | Wt 101.0 lb

## 2012-06-16 DIAGNOSIS — C3490 Malignant neoplasm of unspecified part of unspecified bronchus or lung: Secondary | ICD-10-CM

## 2012-06-16 DIAGNOSIS — R918 Other nonspecific abnormal finding of lung field: Secondary | ICD-10-CM

## 2012-06-16 DIAGNOSIS — R110 Nausea: Secondary | ICD-10-CM

## 2012-06-16 DIAGNOSIS — D696 Thrombocytopenia, unspecified: Secondary | ICD-10-CM

## 2012-06-16 DIAGNOSIS — C3410 Malignant neoplasm of upper lobe, unspecified bronchus or lung: Secondary | ICD-10-CM

## 2012-06-16 DIAGNOSIS — C7931 Secondary malignant neoplasm of brain: Secondary | ICD-10-CM

## 2012-06-16 DIAGNOSIS — Z5111 Encounter for antineoplastic chemotherapy: Secondary | ICD-10-CM

## 2012-06-16 DIAGNOSIS — C7949 Secondary malignant neoplasm of other parts of nervous system: Secondary | ICD-10-CM

## 2012-06-16 LAB — CBC WITH DIFFERENTIAL/PLATELET
BASO%: 0.2 % (ref 0.0–2.0)
EOS%: 0.2 % (ref 0.0–7.0)
HCT: 27.7 % — ABNORMAL LOW (ref 34.8–46.6)
LYMPH%: 18.6 % (ref 14.0–49.7)
MCH: 34.4 pg — ABNORMAL HIGH (ref 25.1–34.0)
MCHC: 32.5 g/dL (ref 31.5–36.0)
MONO#: 0.9 10*3/uL (ref 0.1–0.9)
NEUT%: 66.5 % (ref 38.4–76.8)
Platelets: 119 10*3/uL — ABNORMAL LOW (ref 145–400)
RBC: 2.62 10*6/uL — ABNORMAL LOW (ref 3.70–5.45)
WBC: 5.9 10*3/uL (ref 3.9–10.3)
lymph#: 1.1 10*3/uL (ref 0.9–3.3)
nRBC: 1 % — ABNORMAL HIGH (ref 0–0)

## 2012-06-16 LAB — COMPREHENSIVE METABOLIC PANEL (CC13)
ALT: <6 U/L (ref 0–55)
AST: 20 U/L (ref 5–34)
Alkaline Phosphatase: 91 U/L (ref 40–150)
BUN: 15.1 mg/dL (ref 7.0–26.0)
Creatinine: 0.8 mg/dL (ref 0.6–1.1)

## 2012-06-16 MED ORDER — METHYLPREDNISOLONE SODIUM SUCC 125 MG IJ SOLR
125.0000 mg | Freq: Once | INTRAMUSCULAR | Status: AC
  Administered 2012-06-16: 125 mg via INTRAVENOUS

## 2012-06-16 MED ORDER — DEXAMETHASONE SODIUM PHOSPHATE 4 MG/ML IJ SOLN
20.0000 mg | Freq: Once | INTRAMUSCULAR | Status: AC
  Administered 2012-06-16: 20 mg via INTRAVENOUS

## 2012-06-16 MED ORDER — ONDANSETRON 16 MG/50ML IVPB (CHCC)
16.0000 mg | Freq: Once | INTRAVENOUS | Status: AC
  Administered 2012-06-16: 16 mg via INTRAVENOUS

## 2012-06-16 MED ORDER — SODIUM CHLORIDE 0.9 % IV SOLN
Freq: Once | INTRAVENOUS | Status: AC
  Administered 2012-06-16: 11:00:00 via INTRAVENOUS

## 2012-06-16 MED ORDER — SODIUM CHLORIDE 0.9 % IV SOLN
375.0000 mg/m2 | Freq: Once | INTRAVENOUS | Status: AC
  Administered 2012-06-16: 525 mg via INTRAVENOUS
  Filled 2012-06-16: qty 21

## 2012-06-16 MED ORDER — CARBOPLATIN CHEMO INJECTION 450 MG/45ML
347.6000 mg | Freq: Once | INTRAVENOUS | Status: AC
  Administered 2012-06-16: 350 mg via INTRAVENOUS
  Filled 2012-06-16: qty 35

## 2012-06-16 MED ORDER — OXYCODONE-ACETAMINOPHEN 5-325 MG PO TABS: 1.0000 | ORAL_TABLET | ORAL | Status: DC | PRN

## 2012-06-16 MED ORDER — PROMETHAZINE HCL 25 MG PO TABS: ORAL_TABLET | ORAL | Status: DC

## 2012-06-16 MED ORDER — LORAZEPAM 2 MG/ML IJ SOLN
0.5000 mg | Freq: Once | INTRAMUSCULAR | Status: AC
  Administered 2012-06-16: 0.5 mg via INTRAVENOUS

## 2012-06-16 NOTE — Telephone Encounter (Signed)
Per scheduler and POF I have adjusted appt for 5/5.     [name]

## 2012-06-16 NOTE — Patient Instructions (Signed)
[facility] Discharge Instructions for Patients Receiving Chemotherapy  Today you received the following chemotherapy agents Alimta and Carboplatin  To help prevent nausea and vomiting after your treatment, we encourage you to take your nausea medication as perscribed.  If you develop nausea and vomiting that is not controlled by your nausea medication, call the clinic. If it is after clinic hours your family physician or the after hours number for the clinic or go to the Emergency Department.   BELOW ARE SYMPTOMS THAT SHOULD BE REPORTED IMMEDIATELY:  *FEVER GREATER THAN 100.5 F  *CHILLS WITH OR WITHOUT FEVER  NAUSEA AND VOMITING THAT IS NOT CONTROLLED WITH YOUR NAUSEA MEDICATION  *UNUSUAL SHORTNESS OF BREATH  *UNUSUAL BRUISING OR BLEEDING  TENDERNESS IN MOUTH AND THROAT WITH OR WITHOUT PRESENCE OF ULCERS  *URINARY PROBLEMS  *BOWEL PROBLEMS  UNUSUAL RASH Items with * indicate a potential emergency and should be followed up as soon as possible.   Feel free to call the clinic you have any questions or concerns. The clinic phone number is [phone].   I have been informed and understand all the instructions given to me. I know to contact the clinic, my physician, or go to the Emergency Department if any problems should occur. I do not have any questions at this time, but understand that I may call the clinic during office hours   should I have any questions or need assistance in obtaining follow up care.    __________________________________________  _____________  __________ Signature of Patient or Authorized Representative            Date                   Time    __________________________________________ Nurse's Signature

## 2012-06-16 NOTE — Patient Instructions (Addendum)
Continue weekly labs Follow up with [name] in 3 weeks prior to your next cycle of chemotherapy

## 2012-06-16 NOTE — Progress Notes (Signed)
1310 Pt c/o of shortness of breath. Facial flushing evident. Carboplatin stopped. NS wide open. MD notified and verbal orders received to administer reaction protocol medications. Stop Carboplatin. Observe 30 minutes. Advise with further concerns. 1315  125 mg Solu-Medrol administered IV.  Pt states she is breathing easier. 1317 Pt states " I feel like I am dying, could you please get Dr Arbutus Ped. 1318 MD at chairside. Verbal orders received to give Ativan 0.5 mg IV .MD evaluated pt. 1325 Per MD pt instructed after 30 min of observation she may go home if feeling better or further evaluated in ER. 1340 Pt states she is feeling better and breathing more easily. Pt denies any further complaints or concerns.

## 2012-06-19 NOTE — Progress Notes (Signed)
[facility] Telephone:(336) [phone]   Fax:(336) [phone]  OFFICE PROGRESS NOTE  [name], MD [address] [place] [address]  DIAGNOSIS: Metastatic non-small cell lung cancer, adenocarcinoma with negative EGFR mutation and negative ALK gene translocation diagnosed in September of 2013 with metastatic single brain lesion.   PRIOR THERAPY:  1) Status post stereotactic radiosurgery to the single brain lesion under the care Dr. Kathrynn Running.  2) Concurrent chemoradiation with chemotherapy in the form of weekly carboplatin for an AUC of 2 and paclitaxel at 45 mg per meter squared concurrent with radiation therapy under the care Dr. Kathrynn Running with partial response in her disease   CURRENT THERAPY: Systemic chemotherapy with carboplatin for AUC of 5 and Alimta 500 mg/M2 every 3 weeks. From cycle 3 forward she is receiving carboplatin for an AUC of 4 and Alimta at 375 mg per meter squared due to thrombocytopenia. Status post a total of 3 cycles   INTERVAL HISTORY: Melanie Liu 62 y.o. female returns to the clinic today for routine followup visit. She reports that she completed her course of acyclovir and is feeling much better. She does complain of continued nausea and states that the dexamethasone helps some but Phenergan that she was given in the past helps better. She requests a prescription for Phenergan as well as a refill for her Percocet tablets. She's been having pain in her legs. She reports that she was in bed for 2 days after the last cycle of chemotherapy. She voiced no other specific complaints today. T She denied having any significant chest pain, shortness breath, cough or hemoptysis. She denied having any bleeding issues. She has no weight loss or night sweats.   MEDICAL HISTORY: Past Medical History  Diagnosis Date  . Nodule of right lung     right upper lobe  . Anemia   . Rectovaginal fistula   . Right frontal lobe lesion 12/13/11    CT head  .  History of radiation therapy 12/24/11-02/08/12    rul 66Gy/12fxs  . Dysphagia   . History of radiation therapy 12/27/11    SRS rt ant frontal 20gy  . Lung cancer 11/23/11    biopsy-right  . Brain metastases     ALLERGIES:  is allergic to levaquin.  MEDICATIONS:  Current Outpatient Prescriptions  Medication Sig Dispense Refill  . albuterol (PROVENTIL) (5 MG/ML) 0.5% nebulizer solution Take 0.5 mLs (2.5 mg total) by nebulization every 4 (four) hours as needed for wheezing or shortness of breath.  20 mL  5  . Alum & Mag Hydroxide-Simeth (GI COCKTAIL) SUSP suspension Take 30 mLs by mouth 2 (two) times daily with a meal. Shake well.  300 mL  0  . alum & mag hydroxide-simeth (MAALOX MAX) 400-400-40 MG/5ML suspension Take 5 mLs by mouth every 6 (six) hours as needed for indigestion.  355 mL  0  . calcium-vitamin D (OSCAL WITH D) 500-200 MG-UNIT per tablet Take 1 tablet by mouth daily.      . chlorpheniramine-HYDROcodone (TUSSIONEX PENNKINETIC ER) 10-8 MG/5ML LQCR Take 5 mLs by mouth every 12 (twelve) hours as needed. [place] [facility] [place] [facility] [phone]      . dexamethasone (DECADRON) 4 MG tablet Take 4 mg by mouth 2 (two) times daily.      [name] emollient (BIAFINE) cream Apply topically 2 (two) times daily.      . folic acid (FOLVITE) 1 MG tablet Take 1 tablet (1 mg total) by  mouth daily.  30 tablet  5  . glucosamine-chondroitin 500-400 MG tablet Take 1 tablet by mouth daily.       [name] guaiFENesin (ROBITUSSIN) 100 MG/5ML SOLN Take 20 mLs (400 mg total) by mouth every 6 (six) hours as needed.  1200 mL  0  . ipratropium (ATROVENT) 0.02 % nebulizer solution Take 2.5 mLs (0.5 mg total) by nebulization every 4 (four) hours as needed (pt mixes with albuterol).  75 mL  5  . omeprazole (PRILOSEC) 40 MG capsule Take 1 capsule (40 mg total) by mouth daily.  30 capsule  0  . oxyCODONE-acetaminophen (PERCOCET/ROXICET) 5-325 MG per tablet Take 1 tablet by mouth every 4 (four) hours as needed for  pain.  60 tablet  0  . polyethylene glycol (MIRALAX / GLYCOLAX) packet Take 17 g by mouth daily.  14 each  3  . PRESCRIPTION MEDICATION Place into the left eye every 30 (thirty) days. Bevacizumab (Avastin) inj into the left eye      . PRESCRIPTION MEDICATION [facility]. Last treatment was on 04-10-12. Pt's on an every 3 week cycle. Pt was supposed to have a chemo treatment on Monday  04-28-12 but that treatment was cancelled. Followed by Dr Legrand Como.      . promethazine (PHENERGAN) 25 MG tablet Take 1 to 2 tablets by mouth every 6 hours as needed for nausea  30 tablet  1  . sucralfate (CARAFATE) 1 G tablet Take 1 tablet (1 g total) by mouth 3 (three) times a week. 5 min before food  90 tablet  0  . vitamin E 400 UNIT capsule Take 400 Units by mouth daily.       No current facility-administered medications for this visit.    SURGICAL HISTORY:  Past Surgical History  Procedure Laterality Date  . Partial hysterectomy    . Hemmorhoid surgery    . Neck surgery    . Retinal detachment surgery    . Esophagogastroduodenoscopy N/A 05/01/2012    Procedure: ESOPHAGOGASTRODUODENOSCOPY (EGD);  Surgeon: Shirley Friar, MD;  Location: [facility];  Service: Endoscopy;  Laterality: N/A;    REVIEW OF SYSTEMS:  A comprehensive review of systems was negative except for: Gastrointestinal: positive for nausea Musculoskeletal: positive for Bilateral lower extremity pain   PHYSICAL EXAMINATION: General appearance: alert, cooperative and no distress Head: Normocephalic, without obvious abnormality, atraumatic Neck: no adenopathy Lymph nodes: Cervical, supraclavicular, and axillary nodes normal. Resp: clear to auscultation bilaterally Cardio: regular rate and rhythm, S1, S2 normal, no murmur, click, rub or gallop GI: soft, non-tender; bowel sounds normal; no masses,  no organomegaly Extremities: extremities normal, atraumatic, no cyanosis or edema Neurologic: Alert and oriented X 3, normal  strength and tone. Normal symmetric reflexes. Normal coordination and gait  ECOG PERFORMANCE STATUS: 1 - Symptomatic but completely ambulatory  Blood pressure 111/79, pulse 112, temperature 97.1 F (36.2 C), temperature source Oral, resp. rate 18, height 5\' 2"  (1.575 m), weight 101 lb (45.813 kg).  LABORATORY DATA: Lab Results  Component Value Date   WBC 5.9 06/16/2012   HGB 9.0* 06/16/2012   HCT 27.7* 06/16/2012   MCV 105.7* 06/16/2012   PLT 119* 06/16/2012      Chemistry      Component Value Date/Time   NA 141 06/16/2012 0831   NA 133* 05/01/2012 0525   K 3.9 06/16/2012 0831   K 4.3 05/01/2012 0525   CL 102 06/16/2012 0831   CL 99 05/01/2012 0525   CO2 27  06/16/2012 0831   CO2 23 05/01/2012 0525   BUN 15.1 06/16/2012 0831   BUN 23 05/01/2012 0525   CREATININE 0.8 06/16/2012 0831   CREATININE 0.64 05/01/2012 0525      Component Value Date/Time   CALCIUM 10.2 06/16/2012 0831   CALCIUM 8.9 05/01/2012 0525   ALKPHOS 91 06/16/2012 0831   ALKPHOS 76 04/30/2012 1240   AST 20 06/16/2012 0831   AST 21 04/30/2012 1240   ALT <6 Repeated and Verified 06/16/2012 0831   ALT 17 04/30/2012 1240   BILITOT 0.31 06/16/2012 0831   BILITOT 0.4 04/30/2012 1240       RADIOGRAPHIC STUDIES: Dg Chest 2 View  04/30/2012  *RADIOLOGY REPORT*  Clinical Data: Difficulty swallowing.  Lung cancer.  CHEST - 2 VIEW  Comparison: CT chest and chest radiograph 04/10/2012.  Findings: Trachea is midline.  Heart size normal.  Right upper lobe mass again noted.  Lungs are emphysematous.  No pleural fluid.  IMPRESSION: Right upper lobe mass.  No superimposed acute findings.   Original Report Authenticated By: Leanna Battles, M.D.    Ct Chest W Contrast  05/23/2012  *RADIOLOGY REPORT*  Clinical Data:  Lung cancer with brain metastases diagnosed 11/2011, chemotherapy in progress, XRT complete  CT CHEST, ABDOMEN AND PELVIS WITH CONTRAST  Technique:  Multidetector CT imaging of the chest, abdomen and pelvis was performed following the  standard protocol during bolus administration of intravenous contrast.  Contrast: OMNIPAQUE IOHEXOL 300 MG/ML  SOLN  Comparison:  CTA chest dated 04/10/2012.  PET CT dated 11/14/2011.   CT CHEST  Findings:  5.2 x 6.2 cm posterior right upper lobe mass (series 2/image 15), corresponding to known lung cancer, previously 5.6 x 6.9 cm.  Mass/opacity extends to the right perihilar region (series 2/image 22).  Underlying extensive centrilobular and paraseptal emphysematous changes.  No pleural effusion or pneumothorax.  The visualized thyroid is unremarkable.  Heart is normal in size.  No pericardial effusion.  Atherosclerotic calcifications of the aortic arch.  No suspicious mediastinal, left hilar, or axillary lymphadenopathy.  Degenerative changes of the thoracic spine.  Lower cervical spine fixation hardware.  IMPRESSION: 6.2 cm posterior right upper lobe mass, corresponding to known lung cancer, mildly decreased.   CT ABDOMEN AND PELVIS  Findings:  Liver, spleen, pancreas, and adrenal glands are within normal limits.  1.8 cm gallstone (series 2/image 64). 1.8 x 1.9 cm fluid density lesion arising along the posterior fundal wall (series 2/image 71), of uncertain etiology (possibly fundal adenomyomatosis) but unlikely to be of clinical significance, not definitely visualized on prior PET CT.  Kidneys are within normal limits.  No hydronephrosis.  No evidence of bowel obstruction.  Normal appendix.  Atherosclerotic calcifications of the abdominal aorta and branch vessels.  No abdominopelvic ascites.  No suspicious abdominopelvic lymphadenopathy.  Status post hysterectomy.  No adnexal masses.  Bladder is within normal limits.  Degenerative changes of the lumbar spine.  IMPRESSION: No evidence of metastatic disease in the abdomen/pelvis.  Cholelithiasis.  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   Original Report Authenticated By: Charline Bills, M.D.      ASSESSMENT/PLAN: This is a very pleasant 62 years old white  female with history of metastatic non-small cell lung cancer, adenocarcinoma status post 3 cycles of systemic chemotherapy with carboplatin and Alimta with some improvement in her disease. starting with cycle 3 her carboplatin was decreased to an AUC of 4 and Alimta was decreased to 375 mg per meter squared due to thrombocytopenia. Patient was discussed with  [name]. She will receive cycle #4 of her rate is dose systemic chemotherapy with carboplatin and Alimta. She'll continue with weekly labs consisting of a CBC differential and C. met. A prescription for Phenergan 12.5 mg tablets with instructions to take 1-2 tablets by mouth every 6 hours as needed for nausea was sent to her pharmacy of record via E. scribed. She was also given a refill for her Percocet tablets to address her lower extremity pain.she'll followup with [name] in 3 weeks prior to her next scheduled cycle of chemotherapy.   Laural Benes, Tamarra Geiselman E, PA-C   The patient was advised to call immediately if she has any concerning symptoms in the interval.  All questions were answered. The patient knows to call the clinic with any problems, questions or concerns. We can certainly see the patient much sooner if necessary.  I spent 20 minutes counseling the patient face to face. The total time spent in the appointment was 30 minutes.

## 2012-06-23 ENCOUNTER — Other Ambulatory Visit: Payer: Self-pay | Admitting: Lab

## 2012-06-23 ENCOUNTER — Other Ambulatory Visit: Payer: Self-pay | Admitting: Internal Medicine

## 2012-06-23 DIAGNOSIS — C7931 Secondary malignant neoplasm of brain: Secondary | ICD-10-CM

## 2012-06-23 DIAGNOSIS — C3490 Malignant neoplasm of unspecified part of unspecified bronchus or lung: Secondary | ICD-10-CM

## 2012-06-23 DIAGNOSIS — C3410 Malignant neoplasm of upper lobe, unspecified bronchus or lung: Secondary | ICD-10-CM

## 2012-06-23 LAB — CBC WITH DIFFERENTIAL/PLATELET
BASO%: 0.4 % (ref 0.0–2.0)
EOS%: 0.2 % (ref 0.0–7.0)
HCT: 25.4 % — ABNORMAL LOW (ref 34.8–46.6)
LYMPH%: 17.1 % (ref 14.0–49.7)
MCH: 35.9 pg — ABNORMAL HIGH (ref 25.1–34.0)
MCHC: 33.9 g/dL (ref 31.5–36.0)
MONO%: 17.5 % — ABNORMAL HIGH (ref 0.0–14.0)
NEUT%: 64.8 % (ref 38.4–76.8)
Platelets: 68 10*3/uL — ABNORMAL LOW (ref 145–400)
RBC: 2.4 10*6/uL — ABNORMAL LOW (ref 3.70–5.45)
WBC: 3.3 10*3/uL — ABNORMAL LOW (ref 3.9–10.3)

## 2012-06-23 LAB — COMPREHENSIVE METABOLIC PANEL (CC13)
ALT: 9 U/L (ref 0–55)
AST: 19 U/L (ref 5–34)
Alkaline Phosphatase: 79 U/L (ref 40–150)
CO2: 24 mEq/L (ref 22–29)
Creatinine: 0.8 mg/dL (ref 0.6–1.1)
Sodium: 137 mEq/L (ref 136–145)
Total Bilirubin: 0.47 mg/dL (ref 0.20–1.20)
Total Protein: 7 g/dL (ref 6.4–8.3)

## 2012-06-27 ENCOUNTER — Encounter: Payer: Self-pay | Admitting: Internal Medicine

## 2012-06-27 NOTE — Progress Notes (Signed)
Faxed clinical information to [employer] [facility] [employer] @ [phone]. °

## 2012-06-30 ENCOUNTER — Other Ambulatory Visit: Payer: PRIVATE HEALTH INSURANCE | Admitting: Lab

## 2012-07-01 ENCOUNTER — Other Ambulatory Visit: Payer: Self-pay | Admitting: Radiation Therapy

## 2012-07-01 DIAGNOSIS — C7931 Secondary malignant neoplasm of brain: Secondary | ICD-10-CM

## 2012-07-07 ENCOUNTER — Other Ambulatory Visit: Payer: Self-pay | Admitting: *Deleted

## 2012-07-07 ENCOUNTER — Ambulatory Visit (HOSPITAL_BASED_OUTPATIENT_CLINIC_OR_DEPARTMENT_OTHER): Payer: No Typology Code available for payment source | Admitting: Internal Medicine

## 2012-07-07 ENCOUNTER — Encounter (HOSPITAL_COMMUNITY)
Admission: RE | Admit: 2012-07-07 | Discharge: 2012-07-07 | Disposition: A | Discharge status: Home / Self Care | Payer: Medicaid Other | Source: Ambulatory Visit | Attending: Internal Medicine | Admitting: Internal Medicine

## 2012-07-07 ENCOUNTER — Encounter: Payer: Self-pay | Admitting: Internal Medicine

## 2012-07-07 ENCOUNTER — Other Ambulatory Visit (HOSPITAL_BASED_OUTPATIENT_CLINIC_OR_DEPARTMENT_OTHER): Payer: No Typology Code available for payment source | Admitting: Lab

## 2012-07-07 ENCOUNTER — Ambulatory Visit (HOSPITAL_BASED_OUTPATIENT_CLINIC_OR_DEPARTMENT_OTHER): Payer: No Typology Code available for payment source

## 2012-07-07 ENCOUNTER — Telehealth: Payer: Self-pay | Admitting: Internal Medicine

## 2012-07-07 VITALS — BP 90/54 | HR 94 | Temp 98.6°F | Resp 18

## 2012-07-07 DIAGNOSIS — D6481 Anemia due to antineoplastic chemotherapy: Secondary | ICD-10-CM

## 2012-07-07 DIAGNOSIS — T451X5A Adverse effect of antineoplastic and immunosuppressive drugs, initial encounter: Secondary | ICD-10-CM

## 2012-07-07 DIAGNOSIS — C7931 Secondary malignant neoplasm of brain: Secondary | ICD-10-CM

## 2012-07-07 DIAGNOSIS — C3410 Malignant neoplasm of upper lobe, unspecified bronchus or lung: Secondary | ICD-10-CM

## 2012-07-07 DIAGNOSIS — R112 Nausea with vomiting, unspecified: Secondary | ICD-10-CM

## 2012-07-07 DIAGNOSIS — C3490 Malignant neoplasm of unspecified part of unspecified bronchus or lung: Secondary | ICD-10-CM

## 2012-07-07 LAB — COMPREHENSIVE METABOLIC PANEL (CC13)
ALT: 13 U/L (ref 0–55)
AST: 22 U/L (ref 5–34)
Albumin: 2 g/dL — ABNORMAL LOW (ref 3.5–5.0)
Alkaline Phosphatase: 114 U/L (ref 40–150)
BUN: 10 mg/dL (ref 7.0–26.0)
CO2: 28 meq/L (ref 22–29)
Calcium: 10.1 mg/dL (ref 8.4–10.4)
Chloride: 101 meq/L (ref 98–107)
Creatinine: 0.7 mg/dL (ref 0.6–1.1)
Glucose: 96 mg/dL (ref 70–99)
Potassium: 4.5 meq/L (ref 3.5–5.1)
Sodium: 140 meq/L (ref 136–145)
Total Bilirubin: 0.35 mg/dL (ref 0.20–1.20)
Total Protein: 7 g/dL (ref 6.4–8.3)

## 2012-07-07 LAB — CBC WITH DIFFERENTIAL/PLATELET
BASO%: 0.5 % (ref 0.0–2.0)
HCT: 19.9 % — ABNORMAL LOW (ref 34.8–46.6)
LYMPH%: 6 % — ABNORMAL LOW (ref 14.0–49.7)
MCHC: 32.9 g/dL (ref 31.5–36.0)
MCV: 107.3 fL — ABNORMAL HIGH (ref 79.5–101.0)
MONO#: 1.6 10*3/uL — ABNORMAL HIGH (ref 0.1–0.9)
MONO%: 15.3 % — ABNORMAL HIGH (ref 0.0–14.0)
NEUT%: 74.1 % (ref 38.4–76.8)
Platelets: 334 10*3/uL (ref 145–400)
RBC: 1.86 10*6/uL — ABNORMAL LOW (ref 3.70–5.45)
WBC: 10.1 10*3/uL (ref 3.9–10.3)

## 2012-07-07 LAB — PREPARE RBC (CROSSMATCH)

## 2012-07-07 LAB — TECHNOLOGIST REVIEW

## 2012-07-07 MED ORDER — SODIUM CHLORIDE 0.9 % IV SOLN
250.0000 mL | Freq: Once | INTRAVENOUS | Status: AC
  Administered 2012-07-07: 250 mL via INTRAVENOUS

## 2012-07-07 MED ORDER — DIPHENHYDRAMINE HCL 25 MG PO CAPS
25.0000 mg | ORAL_CAPSULE | Freq: Once | ORAL | Status: AC
  Administered 2012-07-07: 25 mg via ORAL

## 2012-07-07 MED ORDER — ACETAMINOPHEN 325 MG PO TABS
650.0000 mg | ORAL_TABLET | Freq: Once | ORAL | Status: AC
  Administered 2012-07-07: 650 mg via ORAL

## 2012-07-07 NOTE — Progress Notes (Signed)
[facility] Telephone:(336) [phone]   Fax:(336) [phone]  OFFICE PROGRESS NOTE  [name], MD [address] [place] [address]  DIAGNOSIS: Metastatic non-small cell lung cancer, adenocarcinoma with negative EGFR mutation and negative ALK gene translocation diagnosed in September of 2013 with metastatic single brain lesion.   PRIOR THERAPY:  1) Status post stereotactic radiosurgery to the single brain lesion under the care Dr. Kathrynn Running.  2) Concurrent chemoradiation with chemotherapy in the form of weekly carboplatin for an AUC of 2 and paclitaxel at 45 mg per meter squared concurrent with radiation therapy under the care Dr. Kathrynn Running with partial response in her disease   CURRENT THERAPY: Systemic chemotherapy with carboplatin for AUC of 5 and Alimta 500 mg/M2 every 3 weeks. From cycle 3 forward she is receiving carboplatin for an AUC of 4 and Alimta at 375 mg per meter squared due to thrombocytopenia. Status post a total of 4 cycles    INTERVAL HISTORY: Melanie Liu 62 y.o. female returns to the clinic today for followup visit accompanied by her daughter. The patient is feeling tired and fatigued today. She also continues to have shortness breath with exertion. She denied having any significant chest pain, cough or hemoptysis. She denied having any significant weight loss or night sweats. The patient is here today to receive cycle #5 of her chemotherapy.  MEDICAL HISTORY: Past Medical History  Diagnosis Date  . Nodule of right lung     right upper lobe  . Anemia   . Rectovaginal fistula   . Right frontal lobe lesion 12/13/11    CT head  . History of radiation therapy 12/24/11-02/08/12    rul 66Gy/53fxs  . Dysphagia   . History of radiation therapy 12/27/11    SRS rt ant frontal 20gy  . Lung cancer 11/23/11    biopsy-right  . Brain metastases     ALLERGIES:  is allergic to levaquin.  MEDICATIONS:  Current Outpatient Prescriptions  Medication  Sig Dispense Refill  . albuterol (PROVENTIL) (5 MG/ML) 0.5% nebulizer solution Take 0.5 mLs (2.5 mg total) by nebulization every 4 (four) hours as needed for wheezing or shortness of breath.  20 mL  5  . Alum & Mag Hydroxide-Simeth (GI COCKTAIL) SUSP suspension Take 30 mLs by mouth 2 (two) times daily with a meal. Shake well.  300 mL  0  . alum & mag hydroxide-simeth (MAALOX MAX) 400-400-40 MG/5ML suspension Take 5 mLs by mouth every 6 (six) hours as needed for indigestion.  355 mL  0  . calcium-vitamin D (OSCAL WITH D) 500-200 MG-UNIT per tablet Take 1 tablet by mouth daily.      . chlorpheniramine-HYDROcodone (TUSSIONEX PENNKINETIC ER) 10-8 MG/5ML LQCR Take 5 mLs by mouth every 12 (twelve) hours as needed. [place] [phone]      . dexamethasone (DECADRON) 4 MG tablet Take 4 mg by mouth 2 (two) times daily.      [name] emollient (BIAFINE) cream Apply topically 2 (two) times daily.      . folic acid (FOLVITE) 1 MG tablet Take 1 tablet (1 mg total) by mouth daily.  30 tablet  5  . glucosamine-chondroitin 500-400 MG tablet Take 1 tablet by mouth daily.       [name] guaiFENesin (ROBITUSSIN) 100 MG/5ML SOLN Take 20 mLs (400 mg total) by mouth every 6 (six) hours as needed.  1200 mL  0  . ipratropium (ATROVENT) 0.02 % nebulizer solution  Take 2.5 mLs (0.5 mg total) by nebulization every 4 (four) hours as needed (pt mixes with albuterol).  75 mL  5  . omeprazole (PRILOSEC) 40 MG capsule Take 1 capsule (40 mg total) by mouth daily.  30 capsule  0  . oxyCODONE-acetaminophen (PERCOCET/ROXICET) 5-325 MG per tablet Take 1 tablet by mouth every 4 (four) hours as needed for pain.  60 tablet  0  . polyethylene glycol (MIRALAX / GLYCOLAX) packet Take 17 g by mouth daily.  14 each  3  . PRESCRIPTION MEDICATION Place into the left eye every 30 (thirty) days. Bevacizumab (Avastin) inj into the left eye      . PRESCRIPTION MEDICATION [facility]. Last treatment was on 04-10-12. Pt's on  an every 3 week cycle. Pt was supposed to have a chemo treatment on Monday  04-28-12 but that treatment was cancelled. Followed by Dr Legrand Como.      . promethazine (PHENERGAN) 25 MG tablet Take 1 to 2 tablets by mouth every 6 hours as needed for nausea  30 tablet  1  . sucralfate (CARAFATE) 1 G tablet Take 1 tablet (1 g total) by mouth 3 (three) times a week. 5 min before food  90 tablet  0  . vitamin E 400 UNIT capsule Take 400 Units by mouth daily.       No current facility-administered medications for this visit.    SURGICAL HISTORY:  Past Surgical History  Procedure Laterality Date  . Partial hysterectomy    . Hemmorhoid surgery    . Neck surgery    . Retinal detachment surgery    . Esophagogastroduodenoscopy N/A 05/01/2012    Procedure: ESOPHAGOGASTRODUODENOSCOPY (EGD);  Surgeon: Shirley Friar, MD;  Location: [facility];  Service: Endoscopy;  Laterality: N/A;    REVIEW OF SYSTEMS:  A comprehensive review of systems was negative except for: Constitutional: positive for fatigue   PHYSICAL EXAMINATION: General appearance: alert, cooperative, fatigued and no distress Head: Normocephalic, without obvious abnormality, atraumatic Neck: no adenopathy Lymph nodes: Cervical, supraclavicular, and axillary nodes normal. Resp: clear to auscultation bilaterally Cardio: regular rate and rhythm, S1, S2 normal, no murmur, click, rub or gallop GI: soft, non-tender; bowel sounds normal; no masses,  no organomegaly Extremities: extremities normal, atraumatic, no cyanosis or edema Neurologic: Alert and oriented X 3, normal strength and tone. Normal symmetric reflexes. Normal coordination and gait  ECOG PERFORMANCE STATUS: 2 - Symptomatic, <50% confined to bed  Blood pressure 99/59, pulse 119, temperature 98.6 F (37 C), temperature source Oral, resp. rate 18, height 5\' 2"  (1.575 m), weight 100 lb 1.6 oz (45.405 kg).  LABORATORY DATA: Lab Results  Component Value Date   WBC 10.1 07/07/2012     HGB 6.6* 07/07/2012   HCT 19.9* 07/07/2012   MCV 107.3* 07/07/2012   PLT 334 07/07/2012      Chemistry      Component Value Date/Time   NA 137 06/23/2012 0803   NA 133* 05/01/2012 0525   K 4.0 06/23/2012 0803   K 4.3 05/01/2012 0525   CL 102 06/23/2012 0803   CL 99 05/01/2012 0525   CO2 24 06/23/2012 0803   CO2 23 05/01/2012 0525   BUN 18.0 06/23/2012 0803   BUN 23 05/01/2012 0525   CREATININE 0.8 06/23/2012 0803   CREATININE 0.64 05/01/2012 0525      Component Value Date/Time   CALCIUM 9.9 06/23/2012 0803   CALCIUM 8.9 05/01/2012 0525   ALKPHOS 79 06/23/2012 0803  ALKPHOS 76 04/30/2012 1240   AST 19 06/23/2012 0803   AST 21 04/30/2012 1240   ALT 9 06/23/2012 0803   ALT 17 04/30/2012 1240   BILITOT 0.47 06/23/2012 0803   BILITOT 0.4 04/30/2012 1240       RADIOGRAPHIC STUDIES: No results found.  ASSESSMENT: This is a very pleasant 63 years old white female with metastatic non-small cell lung cancer currently undergoing systemic chemotherapy with carboplatin and Alimta status post 4 cycles. The patient has significant anemia today secondary to her recent chemotherapy   PLAN:  1) metastatic non-small cell lung cancer: I will delay the start of cycle #5 to2 07/14/2012. 2) chemotherapy-induced anemia: I will arrange for blood transfusion 1 unit today and 1 unit on 07/09/2012 3) nausea and vomiting: Continue Compazine when necessary. 4) followup visit on 08/05/2012 with the start of the next cycle of chemotherapy. Please call if you have any questions in the interval. All questions were answered. The patient knows to call the clinic with any problems, questions or concerns. We can certainly see the patient much sooner if necessary.

## 2012-07-07 NOTE — Patient Instructions (Signed)
Blood Transfusion Information WHAT IS A BLOOD TRANSFUSION? A transfusion is the replacement of blood or some of its parts. Blood is made up of multiple cells which provide different functions.  Red blood cells carry oxygen and are used for blood loss replacement.  White blood cells fight against infection.  Platelets control bleeding.  Plasma helps clot blood.  Other blood products are available for specialized needs, such as hemophilia or other clotting disorders. BEFORE THE TRANSFUSION  Who gives blood for transfusions?   You may be able to donate blood to be used at a later date on yourself (autologous donation).  Relatives can be asked to donate blood. This is generally not any safer than if you have received blood from a stranger. The same precautions are taken to ensure safety when a relative's blood is donated.  Healthy volunteers who are fully evaluated to make sure their blood is safe. This is blood bank blood. Transfusion therapy is the safest it has ever been in the practice of medicine. Before blood is taken from a donor, a complete history is taken to make sure that person has no history of diseases nor engages in risky social behavior (examples are intravenous drug use or sexual activity with multiple partners). The donor's travel history is screened to minimize risk of transmitting infections, such as malaria. The donated blood is tested for signs of infectious diseases, such as HIV and hepatitis. The blood is then tested to be sure it is compatible with you in order to minimize the chance of a transfusion reaction. If you or a relative donates blood, this is often done in anticipation of surgery and is not appropriate for emergency situations. It takes many days to process the donated blood. RISKS AND COMPLICATIONS Although transfusion therapy is very safe and saves many lives, the main dangers of transfusion include:   Getting an infectious disease.  Developing a  transfusion reaction. This is an allergic reaction to something in the blood you were given. Every precaution is taken to prevent this. The decision to have a blood transfusion has been considered carefully by your caregiver before blood is given. Blood is not given unless the benefits outweigh the risks. AFTER THE TRANSFUSION  Right after receiving a blood transfusion, you will usually feel much better and more energetic. This is especially true if your red blood cells have gotten low (anemic). The transfusion raises the level of the red blood cells which carry oxygen, and this usually causes an energy increase.  The nurse administering the transfusion will monitor you carefully for complications. HOME CARE INSTRUCTIONS  No special instructions are needed after a transfusion. You may find your energy is better. Speak with your caregiver about any limitations on activity for underlying diseases you may have. SEEK MEDICAL CARE IF:   Your condition is not improving after your transfusion.  You develop redness or irritation at the intravenous (IV) site. SEEK IMMEDIATE MEDICAL CARE IF:  Any of the following symptoms occur over the next 12 hours:  Shaking chills.  You have a temperature by mouth above 102 F (38.9 C), not controlled by medicine.  Chest, back, or muscle pain.  People around you feel you are not acting correctly or are confused.  Shortness of breath or difficulty breathing.  Dizziness and fainting.  You get a rash or develop hives.  You have a decrease in urine output.  Your urine turns a dark color or changes to pink, red, or brown. Any of the following   symptoms occur over the next 10 days:  You have a temperature by mouth above 102 F (38.9 C), not controlled by medicine.  Shortness of breath.  Weakness after normal activity.  The white part of the eye turns yellow (jaundice).  You have a decrease in the amount of urine or are urinating less often.  Your  urine turns a dark color or changes to pink, red, or brown. Document Released: 02/17/2000 Document Revised: 05/14/2011 Document Reviewed: 10/06/2007 [employer] Patient Information 2013 [employer].  

## 2012-07-07 NOTE — Patient Instructions (Signed)
Your hemoglobin is very low today. I will arrange received 1 unit of packed rbc's transfusion today and another unit on 07/09/2012. I will delay chemotherapy to next week. Followup visit in one month's on 08/05/2012 with the next cycle of chemotherapy.

## 2012-07-07 NOTE — Telephone Encounter (Signed)
gv and printed appt sced and avs for May and June...emaield MB to add tx...pt awre

## 2012-07-08 LAB — TYPE AND SCREEN: Unit division: 0

## 2012-07-14 ENCOUNTER — Encounter: Payer: Self-pay | Admitting: Internal Medicine

## 2012-07-14 ENCOUNTER — Other Ambulatory Visit (HOSPITAL_BASED_OUTPATIENT_CLINIC_OR_DEPARTMENT_OTHER): Payer: Medicaid Other | Admitting: Lab

## 2012-07-14 ENCOUNTER — Ambulatory Visit (HOSPITAL_BASED_OUTPATIENT_CLINIC_OR_DEPARTMENT_OTHER): Payer: Medicaid Other

## 2012-07-14 DIAGNOSIS — C7931 Secondary malignant neoplasm of brain: Secondary | ICD-10-CM

## 2012-07-14 DIAGNOSIS — C3490 Malignant neoplasm of unspecified part of unspecified bronchus or lung: Secondary | ICD-10-CM

## 2012-07-14 DIAGNOSIS — C3410 Malignant neoplasm of upper lobe, unspecified bronchus or lung: Secondary | ICD-10-CM

## 2012-07-14 DIAGNOSIS — C7949 Secondary malignant neoplasm of other parts of nervous system: Secondary | ICD-10-CM

## 2012-07-14 DIAGNOSIS — Z5111 Encounter for antineoplastic chemotherapy: Secondary | ICD-10-CM

## 2012-07-14 LAB — COMPREHENSIVE METABOLIC PANEL (CC13)
ALT: <6 U/L (ref 0–55)
Albumin: 2.3 g/dL — ABNORMAL LOW (ref 3.5–5.0)
CO2: 26 mEq/L (ref 22–29)
Calcium: 10.1 mg/dL (ref 8.4–10.4)
Chloride: 102 mEq/L (ref 98–107)
Glucose: 116 mg/dl — ABNORMAL HIGH (ref 70–99)
Potassium: 4.1 mEq/L (ref 3.5–5.1)
Sodium: 140 mEq/L (ref 136–145)
Total Protein: 7.4 g/dL (ref 6.4–8.3)

## 2012-07-14 LAB — CBC WITH DIFFERENTIAL/PLATELET
Basophils Absolute: 0.1 10*3/uL (ref 0.0–0.1)
Eosinophils Absolute: 0.3 10*3/uL (ref 0.0–0.5)
HGB: 10 g/dL — ABNORMAL LOW (ref 11.6–15.9)
MCV: 102.2 fL — ABNORMAL HIGH (ref 79.5–101.0)
MONO#: 1 10*3/uL — ABNORMAL HIGH (ref 0.1–0.9)
NEUT#: 7.1 10*3/uL — ABNORMAL HIGH (ref 1.5–6.5)
RBC: 3.21 10*6/uL — ABNORMAL LOW (ref 3.70–5.45)
RDW: 19 % — ABNORMAL HIGH (ref 11.2–14.5)
WBC: 9.6 10*3/uL (ref 3.9–10.3)
nRBC: 0 % (ref 0–0)

## 2012-07-14 MED ORDER — ONDANSETRON 16 MG/50ML IVPB (CHCC)
16.0000 mg | Freq: Once | INTRAVENOUS | Status: AC
  Administered 2012-07-14: 16 mg via INTRAVENOUS

## 2012-07-14 MED ORDER — SODIUM CHLORIDE 0.9 % IV SOLN
Freq: Once | INTRAVENOUS | Status: AC
  Administered 2012-07-14: 12:00:00 via INTRAVENOUS

## 2012-07-14 MED ORDER — DEXAMETHASONE SODIUM PHOSPHATE 20 MG/5ML IJ SOLN
20.0000 mg | Freq: Once | INTRAMUSCULAR | Status: AC
  Administered 2012-07-14: 20 mg via INTRAVENOUS

## 2012-07-14 MED ORDER — SODIUM CHLORIDE 0.9 % IV SOLN
500.0000 mg/m2 | Freq: Once | INTRAVENOUS | Status: AC
  Administered 2012-07-14: 700 mg via INTRAVENOUS
  Filled 2012-07-14: qty 28

## 2012-07-14 NOTE — Progress Notes (Signed)
Pt had a hypersensitivity reaction to Carboplatin on 06/16/12. Per [name], treat with Alimta only today. Pt understands and is in agreement with plan.

## 2012-07-14 NOTE — Progress Notes (Signed)
Put fmla form on nurse's desk °

## 2012-07-14 NOTE — Patient Instructions (Signed)
[facility] Discharge Instructions for Patients Receiving Chemotherapy  Today you received the following chemotherapy agents: Alimta. To help prevent nausea and vomiting after your treatment, we encourage you to take your nausea medication, Compazine. Take it every six hours as needed for nausea.   If you develop nausea and vomiting that is not controlled by your nausea medication, call the clinic. If it is after clinic hours your family physician or the after hours number for the clinic or go to the Emergency Department.   BELOW ARE SYMPTOMS THAT SHOULD BE REPORTED IMMEDIATELY:  *FEVER GREATER THAN 100.5 F  *CHILLS WITH OR WITHOUT FEVER  NAUSEA AND VOMITING THAT IS NOT CONTROLLED WITH YOUR NAUSEA MEDICATION  *UNUSUAL SHORTNESS OF BREATH  *UNUSUAL BRUISING OR BLEEDING  TENDERNESS IN MOUTH AND THROAT WITH OR WITHOUT PRESENCE OF ULCERS  *URINARY PROBLEMS  *BOWEL PROBLEMS  UNUSUAL RASH Items with * indicate a potential emergency and should be followed up as soon as possible.  Feel free to call the clinic you have any questions or concerns. The clinic phone number is [phone].   I have been informed and understand all the instructions given to me. I know to contact the clinic, my physician, or go to the Emergency Department if any problems should occur. I do not have any questions at this time, but understand that I may call the clinic during office hours   should I have any questions or need assistance in obtaining follow up care.

## 2012-07-16 ENCOUNTER — Encounter: Payer: Self-pay | Admitting: Internal Medicine

## 2012-07-16 NOTE — Progress Notes (Signed)
Faxed fmla form to [place] @ [phone].

## 2012-07-18 ENCOUNTER — Ambulatory Visit
Admission: RE | Admit: 2012-07-18 | Discharge: 2012-07-18 | Disposition: A | Discharge status: Home / Self Care | Payer: No Typology Code available for payment source | Source: Ambulatory Visit | Attending: Radiation Oncology | Admitting: Radiation Oncology

## 2012-07-18 DIAGNOSIS — C7949 Secondary malignant neoplasm of other parts of nervous system: Secondary | ICD-10-CM

## 2012-07-18 MED ORDER — GADOBENATE DIMEGLUMINE 529 MG/ML IV SOLN
9.0000 mL | Freq: Once | INTRAVENOUS | Status: AC | PRN
  Administered 2012-07-18: 9 mL via INTRAVENOUS

## 2012-07-18 NOTE — Progress Notes (Signed)
Quick Note:  Susan, please set-up SRS ______ 

## 2012-07-20 ENCOUNTER — Encounter: Payer: Self-pay | Admitting: Radiation Oncology

## 2012-07-20 NOTE — Progress Notes (Signed)
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 Stereotactic radiosurgery to her 19.2 mm rt frontal brain metastasis on 12/27/2011 2. She received 66 Gy in 33 fractions to the primary tumor in the right upper lobe of the lung 12/24/2011-02/08/2012 3. Stereotactic radiosurgery to her new Lt Frontal 5 mm and Rt Parietal 18 mm mets 04/14/2012  Interval Since Last Radiation:  3 months  Narrative:  The patient returns today for routine follow-up.  She has a new 4.8 mm right parietal brain metastasis on today's restaging study. She has sore throat, and has had some right shoulder pain.          ALLERGIES:  is allergic to carboplatin and levaquin.  Meds: Current Outpatient Prescriptions  Medication Sig Dispense Refill  . albuterol (PROVENTIL) (5 MG/ML) 0.5% nebulizer solution Take 0.5 mLs (2.5 mg total) by nebulization every 4 (four) hours as needed for wheezing or shortness of breath.  20 mL  5  . Alum & Mag Hydroxide-Simeth (GI COCKTAIL) SUSP suspension Take 30 mLs by mouth 2 (two) times daily with a meal. Shake well.  300 mL  0  . alum & mag hydroxide-simeth (MAALOX MAX) 400-400-40 MG/5ML suspension Take 5 mLs by mouth every 6 (six) hours as needed for indigestion.  355 mL  0  . calcium-vitamin D (OSCAL WITH D) 500-200 MG-UNIT per tablet Take 1 tablet by mouth daily.      . chlorpheniramine-HYDROcodone (TUSSIONEX PENNKINETIC ER) 10-8 MG/5ML LQCR Take 5 mLs by mouth every 12 (twelve) hours as needed. [place] [facility] [phone]      . dexamethasone (DECADRON) 4 MG tablet Take 4 mg by mouth 2 (two) times daily.      . folic acid (FOLVITE) 1 MG tablet Take 1 tablet (1 mg total) by mouth daily.  30  tablet  5  . glucosamine-chondroitin 500-400 MG tablet Take 1 tablet by mouth daily.       [name] guaiFENesin (ROBITUSSIN) 100 MG/5ML SOLN Take 20 mLs (400 mg total) by mouth every 6 (six) hours as needed.  1200 mL  0  . ipratropium (ATROVENT) 0.02 % nebulizer solution Take 2.5 mLs (0.5 mg total) by nebulization every 4 (four) hours as needed (pt mixes with albuterol).  75 mL  5  . omeprazole (PRILOSEC) 40 MG capsule Take 1 capsule (40 mg total) by mouth daily.  30 capsule  0  . oxyCODONE-acetaminophen (PERCOCET/ROXICET) 5-325 MG per tablet Take 1 tablet by mouth every 4 (four) hours as needed for pain.  60 tablet  0  . polyethylene glycol (MIRALAX / GLYCOLAX) packet Take 17 g by mouth daily.  14 each  3  . PRESCRIPTION MEDICATION Place into the left eye every 30 (thirty) days. Bevacizumab (Avastin) inj into the left eye      . PRESCRIPTION MEDICATION [facility]. Last treatment was on 04-10-12. Pt's on an every 3 week cycle. Pt was supposed to have a chemo treatment on Monday  04-28-12 but that treatment was cancelled. Followed by Dr Legrand Como.      . promethazine (PHENERGAN) 25 MG tablet Take 1 to 2 tablets by mouth every 6 hours as needed for nausea  30 tablet  1  . sucralfate (CARAFATE) 1 G tablet Take 1 tablet (1 g total) by mouth 3 (three) times a week. 5 min before food  90 tablet  0  . vitamin E 400 UNIT capsule Take 400 Units by mouth daily.      [name] emollient (BIAFINE) cream Apply topically 2 (two) times daily.       No current facility-administered medications for this encounter.    Physical Findings: The patient is in no acute distress. Patient is alert and oriented.  weight is 101 lb 1.6 oz (45.859 kg). Her oral temperature is 97 F (36.1 C). Her blood pressure is 105/63 and her pulse is 126. Her respiration is 16 and oxygen saturation is 100%. .  No significant changes.  Radiographic Findings: Mr [name] ZO Contrast  07/18/2012   *RADIOLOGY REPORT*  Clinical Data: Lung cancer.   Restaging metastatic disease.  S R S protocol.  MRI HEAD WITHOUT AND WITH CONTRAST  Technique:  Multiplanar, multiecho pulse sequences of the brain and surrounding structures were obtained according to standard protocol without and with intravenous contrast  Contrast: 9mL MULTIHANCE GADOBENATE DIMEGLUMINE 529 MG/ML IV SOLN  Comparison: 03/28/2012.  12/21/2011.  Findings: Previously treated inferior frontal lesion and on the right continues to become smaller, measuring 5 x 6 mm today as opposed to 10 x 12 mm on the previous study.  Treated lesion at the right apex is much smaller measuring 6 x 9 mm today as opposed 215 x 18 mm previously.  Treated lesion in the left posterior frontal region is much smaller, measuring only 2 mm in size as opposed to 5 x 6 mm previously.  All of these are associated with less surrounding edema.  There is progression of the lesion in the white matter of the right parietal lobe adjacent to the posterior body of the right lateral ventricle.  This measures 5 mm in diameter.  On the previous study, this measured 2 mm. There is moderate surrounding edema.  No other new finding. No hydrocephalus.  No extra-axial collection. No inflammatory sinus disease.  No skull or skull base lesion.  IMPRESSION: Reduction in size of the three previously treated lesions in the inferior right frontal region, right parietal region and left posterior frontal region.  Enlargement of a metastasis in the right parietal white matter adjacent to the posterior body of the right lateral ventricle with surrounding edema.  Lesion measures 5 mm.   Original Report Authenticated By: Paulina Fusi, M.D.   Impression:  The patient has stage IV non-small cell lung cancer with controlled extracranial disease as well as previously treated well controlled brain metastases. She now has a new 4.8 mm right parietal brain metastasis.  At this point, the patient would potentially benefit from radiotherapy. The options include whole  brain irradiation versus stereotactic radiosurgery. There are pros and cons associated with each of these potential treatment options. Whole brain radiotherapy would treat the known metastatic deposits and help provide some reduction of risk for future brain metastases. However, whole brain radiotherapy carries potential risks including hair loss, subacute somnolence, and neurocognitive changes including a possible reduction in short-term memory. Whole brain radiotherapy also may carry a lower likelihood of tumor control at the treatment sites because of the low-dose used. Stereotactic radiosurgery carries a higher likelihood for local tumor control at the targeted sites with lower associated risk for neurocognitive changes such as memory loss. However, the use of stereotactic radiosurgery in this setting may leave  the patient at increased risk for new brain metastases elsewhere in the brain as high as 50-60%. Accordingly, patients who receive stereotactic radiosurgery in this setting should undergo ongoing surveillance imaging with brain MRI more frequently in order to identify and treat new small brain metastases before they become symptomatic. Stereotactic radiosurgery does carry some different risks, including a risk of radionecrosis.  PLAN: Today, I reviewed the findings and workup thus far with the patient. We discussed the dilemma regarding whole brain radiotherapy versus stereotactic radiosurgery. We discussed the pros and cons of each. We also discussed the logistics and delivery of each. We reviewed the results associated with each of the treatments described above. The patient seems to understand the treatment options and would like to proceed with stereotactic radiosurgery.  I spent 20 minutes minutes face to face with the patient and more than 50% of that time was spent in counseling and/or coordination of care.     _____________________________________  Artist Pais. Kathrynn Running, M.D.

## 2012-07-21 ENCOUNTER — Encounter: Payer: Self-pay | Admitting: Radiation Oncology

## 2012-07-21 ENCOUNTER — Ambulatory Visit
Admission: RE | Admit: 2012-07-21 | Discharge: 2012-07-21 | Disposition: A | Discharge status: Home / Self Care | Payer: No Typology Code available for payment source | Source: Ambulatory Visit | Attending: Radiation Oncology | Admitting: Radiation Oncology

## 2012-07-21 ENCOUNTER — Other Ambulatory Visit (HOSPITAL_BASED_OUTPATIENT_CLINIC_OR_DEPARTMENT_OTHER): Payer: Medicaid Other

## 2012-07-21 VITALS — BP 105/63 | HR 126 | Temp 97.0°F | Resp 16 | Wt 101.1 lb

## 2012-07-21 DIAGNOSIS — C7931 Secondary malignant neoplasm of brain: Secondary | ICD-10-CM

## 2012-07-21 DIAGNOSIS — C7949 Secondary malignant neoplasm of other parts of nervous system: Secondary | ICD-10-CM

## 2012-07-21 DIAGNOSIS — C3490 Malignant neoplasm of unspecified part of unspecified bronchus or lung: Secondary | ICD-10-CM

## 2012-07-21 DIAGNOSIS — C3410 Malignant neoplasm of upper lobe, unspecified bronchus or lung: Secondary | ICD-10-CM

## 2012-07-21 LAB — CBC WITH DIFFERENTIAL/PLATELET
BASO%: 0.5 % (ref 0.0–2.0)
LYMPH%: 9.1 % — ABNORMAL LOW (ref 14.0–49.7)
MCHC: 32.8 g/dL (ref 31.5–36.0)
MONO#: 0.3 10*3/uL (ref 0.1–0.9)
NEUT#: 4.6 10*3/uL (ref 1.5–6.5)
Platelets: 189 10*3/uL (ref 145–400)
RBC: 3.07 10*6/uL — ABNORMAL LOW (ref 3.70–5.45)
RDW: 18.9 % — ABNORMAL HIGH (ref 11.2–14.5)
WBC: 5.8 10*3/uL (ref 3.9–10.3)
lymph#: 0.5 10*3/uL — ABNORMAL LOW (ref 0.9–3.3)

## 2012-07-21 LAB — COMPREHENSIVE METABOLIC PANEL (CC13)
ALT: 13 U/L (ref 0–55)
Albumin: 2.4 g/dL — ABNORMAL LOW (ref 3.5–5.0)
Alkaline Phosphatase: 85 U/L (ref 40–150)
CO2: 26 mEq/L (ref 22–29)
Potassium: 3.8 mEq/L (ref 3.5–5.1)
Sodium: 141 mEq/L (ref 136–145)
Total Bilirubin: 0.3 mg/dL (ref 0.20–1.20)
Total Protein: 6.8 g/dL (ref 6.4–8.3)

## 2012-07-21 MED ORDER — GI COCKTAIL ~~LOC~~: 30.0000 mL | Freq: Two times a day (BID) | ORAL | Status: DC

## 2012-07-21 NOTE — Progress Notes (Signed)
Patient presents to the clinic today accompanied by her daughter for follow up with Dr. Kathrynn Running and Dr. Venetia Maxon. Patient alert and oriented to person, place, and time. No distress noted. Patient being pushed in wheelchair but about to stand for weight without assistance. Pleasant affect noted. Patient denies pain at this time. However, patient reports that recently she has had to take percocet more frequently due to right shoulder pain. Patient reports nausea each morning but, denies emesis or diarrhea and relates this to a gallstone. Patient reports taking decadron only with chemo therapy. Patient's last chemo was one week ago with the next scheduled for June 3. Patient reports a chronic dry cough and SOB with exertion. Patient received a blood transfusion two weeks ago. Patient denies diplopia or ringing in the ear. Patient denies change in memory. Patient responds quickly and appropriately to questions. Reported all findings to Dr. Kathrynn Running.

## 2012-07-21 NOTE — Progress Notes (Signed)
Plan SRS treatment to 5 mm metastasis.

## 2012-07-29 ENCOUNTER — Other Ambulatory Visit: Payer: Self-pay | Admitting: Lab

## 2012-07-30 ENCOUNTER — Ambulatory Visit (HOSPITAL_BASED_OUTPATIENT_CLINIC_OR_DEPARTMENT_OTHER): Payer: Medicaid Other | Admitting: Lab

## 2012-07-30 ENCOUNTER — Encounter: Payer: Self-pay | Admitting: Radiation Oncology

## 2012-07-30 ENCOUNTER — Ambulatory Visit
Admission: RE | Admit: 2012-07-30 | Discharge: 2012-07-30 | Disposition: A | Discharge status: Home / Self Care | Payer: Medicaid Other | Source: Ambulatory Visit | Attending: Radiation Oncology | Admitting: Radiation Oncology

## 2012-07-30 DIAGNOSIS — C7949 Secondary malignant neoplasm of other parts of nervous system: Secondary | ICD-10-CM

## 2012-07-30 DIAGNOSIS — C3490 Malignant neoplasm of unspecified part of unspecified bronchus or lung: Secondary | ICD-10-CM

## 2012-07-30 DIAGNOSIS — C7931 Secondary malignant neoplasm of brain: Secondary | ICD-10-CM

## 2012-07-30 DIAGNOSIS — C3410 Malignant neoplasm of upper lobe, unspecified bronchus or lung: Secondary | ICD-10-CM

## 2012-07-30 LAB — CBC WITH DIFFERENTIAL/PLATELET
BASO%: 0.6 % (ref 0.0–2.0)
LYMPH%: 6.5 % — ABNORMAL LOW (ref 14.0–49.7)
MCHC: 32.6 g/dL (ref 31.5–36.0)
MCV: 96.4 fL (ref 79.5–101.0)
MONO%: 14.5 % — ABNORMAL HIGH (ref 0.0–14.0)
Platelets: 250 10*3/uL (ref 145–400)
RBC: 3.12 10*6/uL — ABNORMAL LOW (ref 3.70–5.45)
WBC: 6 10*3/uL (ref 3.9–10.3)

## 2012-07-30 LAB — COMPREHENSIVE METABOLIC PANEL (CC13)
ALT: 9 U/L (ref 0–55)
AST: 15 U/L (ref 5–34)
Alkaline Phosphatase: 110 U/L (ref 40–150)
Sodium: 140 mEq/L (ref 136–145)
Total Bilirubin: 0.29 mg/dL (ref 0.20–1.20)
Total Protein: 7.5 g/dL (ref 6.4–8.3)

## 2012-07-30 MED ORDER — SODIUM CHLORIDE 0.9 % IJ SOLN
10.0000 mL | Freq: Once | INTRAMUSCULAR | Status: AC
  Administered 2012-07-30: 10 mL via INTRAVENOUS

## 2012-07-30 NOTE — Progress Notes (Signed)
  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  Identity was confirmed.  All relevant records and images related to the planned course of therapy were reviewed.  The patient freely provided informed written consent to proceed with treatment after reviewing the details related to the planned course of therapy. The consent form was witnessed and verified by the simulation staff. Intravenous access was established for contrast administration. Then, the patient was set-up in a stable reproducible supine position for radiation therapy.  A relocatable thermoplastic stereotactic head frame was fabricated for precise immobilization.  CT images were obtained.  Surface markings were placed.  The CT images were loaded into the planning software and fused with the patient's targeting MRI scan.  Then the target and avoidance structures were contoured.  Treatment planning then occurred.  The radiation prescription was entered and confirmed.  I have requested 3D planning  I have requested a DVH of the following structures: Brain stem, brain, left eye, right I, lenses, optic chiasm, target volumes, uninvolved brain, and normal tissue.    PLAN:  The patient will receive 20 Gy in one fraction.  ________________________________  Artist Pais Kathrynn Running, M.D.

## 2012-08-03 NOTE — Progress Notes (Signed)
  Radiation Oncology         [phone] ________________________________  Stereotactic Treatment Procedure Note  Name: ALEASHA FREGEAU MRN: [id number]  Date: 08/04/2012  DOB: [birth year]  SPECIAL TREATMENT PROCEDURE  3D TREATMENT PLANNING AND DOSIMETRY:  The patient's radiation plan was reviewed and approved by neurosurgery and radiation oncology prior to treatment.  It showed 3-dimensional radiation distributions overlaid onto the planning CT/MRI image set.  [facility] for the target structures as well as the organs at risk were reviewed. The documentation of the 3D plan and dosimetry are filed in the radiation oncology EMR.  NARRATIVE:  CHAVONNE SFORZA was brought to the TrueBeam stereotactic radiation treatment machine and placed supine on the CT couch. The head frame was applied, and the patient was set up for stereotactic radiosurgery.  Dr. Venetia Maxon from neurosurgery was present for the set-up and delivery  SIMULATION VERIFICATION:  In the couch zero-angle position, the patient underwent Exactrac imaging using the Brainlab system with orthogonal KV images.  These were carefully aligned and repeated to confirm treatment position for each of the isocenters.  The Exactrac snap film verification was repeated at each couch angle.  SPECIAL TREATMENT PROCEDURE: [name] received stereotactic radiosurgery to the following targets: Right Parietal 4.8 mm target was treated using 4 Circular Arcs to a prescription dose of 20 Gy. The 10 mm circular collimator was used. ExacTrac registration was performed for each couch angle.  The 88% isodose line was prescribed.  STEREOTACTIC TREATMENT MANAGEMENT:  Following delivery, the patient was transported to nursing in stable condition and monitored for possible acute effects.  Vital signs were recorded BP 111/68  Pulse 109  Temp(Src) 98.6 F (37 C) (Oral)  Resp 18  SpO2 100%. The patient tolerated treatment without significant acute effects, and was discharged  to home in stable condition.    PLAN: Follow-up in one month.  ________________________________  Artist Pais. Kathrynn Running, M.D.

## 2012-08-04 ENCOUNTER — Encounter: Payer: Self-pay | Admitting: Radiation Oncology

## 2012-08-04 ENCOUNTER — Ambulatory Visit
Admission: RE | Admit: 2012-08-04 | Discharge: 2012-08-04 | Disposition: A | Discharge status: Home / Self Care | Payer: Medicaid Other | Source: Ambulatory Visit | Attending: Radiation Oncology | Admitting: Radiation Oncology

## 2012-08-04 VITALS — BP 111/68 | HR 109 | Temp 98.6°F | Resp 18

## 2012-08-04 DIAGNOSIS — C7949 Secondary malignant neoplasm of other parts of nervous system: Secondary | ICD-10-CM

## 2012-08-04 NOTE — Progress Notes (Signed)
Nurse monitoring complete. Denies nausea, headache, dizziness, diplopia, or ringing in the ears. Irwin Brakeman provided patient with follow up appointment card. Encouraged patient  to contact staff with future needs. Patient verbalized understanding. Wheeled patient to lobby for discharge home with family.

## 2012-08-04 NOTE — Addendum Note (Signed)
Encounter addended by: Maeola Harman, MD on: 08/04/2012 10:42 PM<BR>     Documentation filed: Clinical Notes

## 2012-08-04 NOTE — Op Note (Signed)
  Radiation Oncology [phone]) [phone]  ________________________________  Stereotactic Treatment Procedure Note  Name: ZAHNIYA ZELLARS MRN: [id number]  Date: 08/04/2012 DOB: 1950/06/08  SPECIAL TREATMENT PROCEDURE  3D TREATMENT PLANNING AND DOSIMETRY: The patient's radiation plan was reviewed and approved by neurosurgery and radiation oncology prior to treatment. It showed 3-dimensional radiation distributions overlaid onto the planning CT/MRI image set. [facility] for the target structures as well as the organs at risk were reviewed. The documentation of the 3D plan and dosimetry are filed in the radiation oncology EMR.  NARRATIVE: MATRACA HUNKINS was brought to the TrueBeam stereotactic radiation treatment machine and placed supine on the CT couch. The head frame was applied, and the patient was set up for stereotactic radiosurgery. Dr. Venetia Maxon from neurosurgery was present for the set-up and delivery  SIMULATION VERIFICATION: In the couch zero-angle position, the patient underwent Exactrac imaging using the Brainlab system with orthogonal KV images. These were carefully aligned and repeated to confirm treatment position for each of the isocenters. The Exactrac snap film verification was repeated at each couch angle.  SPECIAL TREATMENT PROCEDURE: [name] received stereotactic radiosurgery to the following targets:  Right Parietal 4.8 mm target was treated using 4 Circular Arcs to a prescription dose of 20 Gy. The 10 mm circular collimator was used. ExacTrac registration was performed for each couch angle. The 88% isodose line was prescribed.  STEREOTACTIC TREATMENT MANAGEMENT: Following delivery, the patient was transported to nursing in stable condition and monitored for possible acute effects. Vital signs were recorded BP 111/68  Pulse 109  Temp(Src) 98.6 F (37 C) (Oral)  Resp 18  SpO2 100%. The patient tolerated treatment without significant acute effects, and was discharged to home in stable  condition.  PLAN: Follow-up in one month.  ________________________________   Danae Orleans. Venetia Maxon, M.D.

## 2012-08-04 NOTE — Progress Notes (Signed)
Received patient in clinic following SRS treatment for nurse monitoring. Patient alert and oriented to person, place, and time. No distress noted. Pleasant affect noted. Patient denies pain at this time. Vitals stable. Denies headache, dizziness, nausea or vomiting. Refused offer of food and drink. Encouraged to call with needs.

## 2012-08-05 ENCOUNTER — Ambulatory Visit (HOSPITAL_BASED_OUTPATIENT_CLINIC_OR_DEPARTMENT_OTHER): Payer: Medicaid Other | Admitting: Internal Medicine

## 2012-08-05 ENCOUNTER — Other Ambulatory Visit (HOSPITAL_BASED_OUTPATIENT_CLINIC_OR_DEPARTMENT_OTHER): Payer: Medicaid Other | Admitting: Lab

## 2012-08-05 ENCOUNTER — Encounter: Payer: Self-pay | Admitting: Internal Medicine

## 2012-08-05 ENCOUNTER — Telehealth: Payer: Self-pay | Admitting: *Deleted

## 2012-08-05 ENCOUNTER — Ambulatory Visit (HOSPITAL_BASED_OUTPATIENT_CLINIC_OR_DEPARTMENT_OTHER): Payer: Medicaid Other

## 2012-08-05 ENCOUNTER — Encounter: Payer: Self-pay | Admitting: *Deleted

## 2012-08-05 ENCOUNTER — Telehealth: Payer: Self-pay | Admitting: Internal Medicine

## 2012-08-05 DIAGNOSIS — C3410 Malignant neoplasm of upper lobe, unspecified bronchus or lung: Secondary | ICD-10-CM

## 2012-08-05 DIAGNOSIS — D649 Anemia, unspecified: Secondary | ICD-10-CM

## 2012-08-05 DIAGNOSIS — Z5111 Encounter for antineoplastic chemotherapy: Secondary | ICD-10-CM

## 2012-08-05 DIAGNOSIS — C7949 Secondary malignant neoplasm of other parts of nervous system: Secondary | ICD-10-CM

## 2012-08-05 DIAGNOSIS — C7931 Secondary malignant neoplasm of brain: Secondary | ICD-10-CM

## 2012-08-05 DIAGNOSIS — C3490 Malignant neoplasm of unspecified part of unspecified bronchus or lung: Secondary | ICD-10-CM

## 2012-08-05 LAB — CBC WITH DIFFERENTIAL/PLATELET
BASO%: 0.2 % (ref 0.0–2.0)
Eosinophils Absolute: 0 10*3/uL (ref 0.0–0.5)
LYMPH%: 7.9 % — ABNORMAL LOW (ref 14.0–49.7)
MCHC: 30.1 g/dL — ABNORMAL LOW (ref 31.5–36.0)
MCV: 99.3 fL (ref 79.5–101.0)
MONO%: 6.1 % (ref 0.0–14.0)
Platelets: 368 10*3/uL (ref 145–400)
RBC: 2.98 10*6/uL — ABNORMAL LOW (ref 3.70–5.45)
nRBC: 0 % (ref 0–0)

## 2012-08-05 LAB — COMPREHENSIVE METABOLIC PANEL (CC13)
ALT: <6 U/L (ref 0–55)
CO2: 26 mEq/L (ref 22–29)
Creatinine: 0.7 mg/dL (ref 0.6–1.1)
Total Bilirubin: 0.21 mg/dL (ref 0.20–1.20)

## 2012-08-05 MED ORDER — SODIUM CHLORIDE 0.9 % IV SOLN
500.0000 mg/m2 | Freq: Once | INTRAVENOUS | Status: AC
  Administered 2012-08-05: 700 mg via INTRAVENOUS
  Filled 2012-08-05: qty 28

## 2012-08-05 MED ORDER — SODIUM CHLORIDE 0.9 % IV SOLN
Freq: Once | INTRAVENOUS | Status: AC
  Administered 2012-08-05: 11:00:00 via INTRAVENOUS

## 2012-08-05 MED ORDER — DEXAMETHASONE SODIUM PHOSPHATE 20 MG/5ML IJ SOLN
20.0000 mg | Freq: Once | INTRAMUSCULAR | Status: AC
  Administered 2012-08-05: 20 mg via INTRAVENOUS

## 2012-08-05 MED ORDER — CYANOCOBALAMIN 1000 MCG/ML IJ SOLN
1000.0000 ug | Freq: Once | INTRAMUSCULAR | Status: AC
  Administered 2012-08-05: 1000 ug via INTRAMUSCULAR

## 2012-08-05 MED ORDER — ONDANSETRON 16 MG/50ML IVPB (CHCC)
16.0000 mg | Freq: Once | INTRAVENOUS | Status: AC
  Administered 2012-08-05: 16 mg via INTRAVENOUS

## 2012-08-05 NOTE — Progress Notes (Signed)
[facility] Telephone:(336) [phone]   Fax:(336) [phone]  OFFICE PROGRESS NOTE  [name], MD [address] [place] [address]  DIAGNOSIS: Metastatic non-small cell lung cancer, adenocarcinoma with negative EGFR mutation and negative ALK gene translocation diagnosed in September of 2013 with metastatic single brain lesion.   PRIOR THERAPY:  1) Status post stereotactic radiosurgery to the single brain lesion under the care Dr. Kathrynn Running.  2) Concurrent chemoradiation with chemotherapy in the form of weekly carboplatin for an AUC of 2 and paclitaxel at 45 mg per meter squared concurrent with radiation therapy under the care Dr. Kathrynn Running with partial response in her disease   CURRENT THERAPY: Systemic chemotherapy with carboplatin for AUC of 5 and Alimta 500 mg/M2 every 3 weeks. From cycle 3 forward she is receiving carboplatin for an AUC of 4 and Alimta at 375 mg per meter squared due to thrombocytopenia. Status post a total of 5 cycles   INTERVAL HISTORY: Melanie Liu 62 y.o. female returns to the clinic today for followup visit accompanied by a friend. The patient is feeling fine today with no specific complaints. She tolerated the last cycle of her systemic chemotherapy fairly well with no significant adverse effects. She denied having any significant fever or chills. She has no nausea or vomiting. The patient denied having any significant chest pain but continues to have shortness of breath with exertion, no cough or hemoptysis.  MEDICAL HISTORY: Past Medical History  Diagnosis Date  . Nodule of right lung     right upper lobe  . Anemia   . Rectovaginal fistula   . Right frontal lobe lesion 12/13/11    CT head  . History of radiation therapy 12/24/11-02/08/12    rul 66Gy/23fxs  . Dysphagia   . History of radiation therapy 12/27/11    SRS rt ant frontal 20gy  . Lung cancer 11/23/11    biopsy-right  . Brain metastases     ALLERGIES:  is allergic  to carboplatin and levaquin.  MEDICATIONS:  Current Outpatient Prescriptions  Medication Sig Dispense Refill  . albuterol (PROVENTIL) (5 MG/ML) 0.5% nebulizer solution Take 0.5 mLs (2.5 mg total) by nebulization every 4 (four) hours as needed for wheezing or shortness of breath.  20 mL  5  . Alum & Mag Hydroxide-Simeth (GI COCKTAIL) SUSP suspension Take 30 mLs by mouth 2 (two) times daily with a meal. Shake well.  300 mL  0  . alum & mag hydroxide-simeth (MAALOX MAX) 400-400-40 MG/5ML suspension Take 5 mLs by mouth every 6 (six) hours as needed for indigestion.  355 mL  0  . calcium-vitamin D (OSCAL WITH D) 500-200 MG-UNIT per tablet Take 1 tablet by mouth daily.      . chlorpheniramine-HYDROcodone (TUSSIONEX PENNKINETIC ER) 10-8 MG/5ML LQCR Take 5 mLs by mouth every 12 (twelve) hours as needed. Called [facility] by MD to [facility]. [phone]      . dexamethasone (DECADRON) 4 MG tablet Take 4 mg by mouth 2 (two) times daily.      [name] emollient (BIAFINE) cream Apply topically 2 (two) times daily.      . folic acid (FOLVITE) 1 MG tablet Take 1 tablet (1 mg total) by mouth daily.  30 tablet  5  . glucosamine-chondroitin 500-400 MG tablet Take 1 tablet by mouth daily.       [name] guaiFENesin (ROBITUSSIN) 100 MG/5ML SOLN Take 20 mLs (400 mg total) by mouth every 6 (six) hours as needed.  1200 mL  0  . ipratropium (ATROVENT) 0.02 % nebulizer solution Take 2.5 mLs (0.5 mg total) by nebulization every 4 (four) hours as needed (pt mixes with albuterol).  75 mL  5  . omeprazole (PRILOSEC) 40 MG capsule Take 1 capsule (40 mg total) by mouth daily.  30 capsule  0  . oxyCODONE-acetaminophen (PERCOCET/ROXICET) 5-325 MG per tablet Take 1 tablet by mouth every 4 (four) hours as needed for pain.  60 tablet  0  . polyethylene glycol (MIRALAX / GLYCOLAX) packet Take 17 g by mouth daily.  14 each  3  . PRESCRIPTION MEDICATION Place into the left eye every 30 (thirty) days. Bevacizumab (Avastin) inj into the left eye       . PRESCRIPTION MEDICATION [facility]. Last treatment was on 04-10-12. Pt's on an every 3 week cycle. Pt was supposed to have a chemo treatment on Monday  04-28-12 but that treatment was cancelled. Followed by Dr Legrand Como.      . promethazine (PHENERGAN) 25 MG tablet Take 1 to 2 tablets by mouth every 6 hours as needed for nausea  30 tablet  1  . sucralfate (CARAFATE) 1 G tablet Take 1 tablet (1 g total) by mouth 3 (three) times a week. 5 min before food  90 tablet  0  . vitamin E 400 UNIT capsule Take 400 Units by mouth daily.       No current facility-administered medications for this visit.    SURGICAL HISTORY:  Past Surgical History  Procedure Laterality Date  . Partial hysterectomy    . Hemmorhoid surgery    . Neck surgery    . Retinal detachment surgery    . Esophagogastroduodenoscopy N/A 05/01/2012    Procedure: ESOPHAGOGASTRODUODENOSCOPY (EGD);  Surgeon: Shirley Friar, MD;  Location: [facility];  Service: Endoscopy;  Laterality: N/A;    REVIEW OF SYSTEMS:  A comprehensive review of systems was negative except for: Constitutional: positive for fatigue Respiratory: positive for dyspnea on exertion   PHYSICAL EXAMINATION: General appearance: alert, cooperative, fatigued and no distress Head: Normocephalic, without obvious abnormality, atraumatic Neck: no adenopathy Lymph nodes: Cervical, supraclavicular, and axillary nodes normal. Resp: clear to auscultation bilaterally Cardio: regular rate and rhythm, S1, S2 normal, no murmur, click, rub or gallop GI: soft, non-tender; bowel sounds normal; no masses,  no organomegaly Extremities: extremities normal, atraumatic, no cyanosis or edema  ECOG PERFORMANCE STATUS: 1 - Symptomatic but completely ambulatory  Blood pressure 106/68, pulse 105, temperature 97.1 F (36.2 C), temperature source Oral, resp. rate 18, height 5\' 2"  (1.575 m), weight 101 lb (45.813 kg).  LABORATORY DATA: Lab Results  Component Value Date    WBC 8.3 08/05/2012   HGB 8.9* 08/05/2012   HCT 29.6* 08/05/2012   MCV 99.3 08/05/2012   PLT 368 08/05/2012      Chemistry      Component Value Date/Time   NA 140 07/30/2012 0830   NA 133* 05/01/2012 0525   K 4.0 07/30/2012 0830   K 4.3 05/01/2012 0525   CL 103 07/30/2012 0830   CL 99 05/01/2012 0525   CO2 25 07/30/2012 0830   CO2 23 05/01/2012 0525   BUN 13.0 07/30/2012 0830   BUN 23 05/01/2012 0525   CREATININE 0.9 07/30/2012 0830   CREATININE 0.64 05/01/2012 0525      Component Value Date/Time   CALCIUM 10.2 07/30/2012 0830   CALCIUM 8.9 05/01/2012 0525   ALKPHOS 110 07/30/2012 0830   ALKPHOS 76 04/30/2012  1240   AST 15 07/30/2012 0830   AST 21 04/30/2012 1240   ALT 9 07/30/2012 0830   ALT 17 04/30/2012 1240   BILITOT 0.29 07/30/2012 0830   BILITOT 0.4 04/30/2012 1240       RADIOGRAPHIC STUDIES: Mr [name] ZO Contrast  08-02-12   *RADIOLOGY REPORT*  Clinical Data: Lung cancer.  Restaging metastatic disease.  S R S protocol.  MRI HEAD WITHOUT AND WITH CONTRAST  Technique:  Multiplanar, multiecho pulse sequences of the brain and surrounding structures were obtained according to standard protocol without and with intravenous contrast  Contrast: 9mL MULTIHANCE GADOBENATE DIMEGLUMINE 529 MG/ML IV SOLN  Comparison: 03/28/2012.  12/21/2011.  Findings: Previously treated inferior frontal lesion and on the right continues to become smaller, measuring 5 x 6 mm today as opposed to 10 x 12 mm on the previous study.  Treated lesion at the right apex is much smaller measuring 6 x 9 mm today as opposed 215 x 18 mm previously.  Treated lesion in the left posterior frontal region is much smaller, measuring only 2 mm in size as opposed to 5 x 6 mm previously.  All of these are associated with less surrounding edema.  There is progression of the lesion in the white matter of the right parietal lobe adjacent to the posterior body of the right lateral ventricle.  This measures 5 mm in diameter.  On the previous study, this  measured 2 mm. There is moderate surrounding edema.  No other new finding. No hydrocephalus.  No extra-axial collection. No inflammatory sinus disease.  No skull or skull base lesion.  IMPRESSION: Reduction in size of the three previously treated lesions in the inferior right frontal region, right parietal region and left posterior frontal region.  Enlargement of a metastasis in the right parietal white matter adjacent to the posterior body of the right lateral ventricle with surrounding edema.  Lesion measures 5 mm.   Original Report Authenticated By: Paulina Fusi, M.D.    ASSESSMENT: This is a very pleasant 62 years old white female with metastatic non-small cell lung cancer, adenocarcinoma currently undergoing systemic chemotherapy with carboplatin and Alimta status post 5 cycles. The patient is doing fine and tolerating her treatment fairly well.   PLAN: We will proceed with cycle #6 today as scheduled. The patient would come back for followup visit in 3 weeks with repeat CT scan of the chest, abdomen and pelvis for restaging of her disease. If she has no evidence for disease progression, I may consider the patient for maintenance chemotherapy with single agent Alimta. She was advised to call me immediately if she has any concerning symptoms in the interval.  All questions were answered. The patient knows to call the clinic with any problems, questions or concerns. We can certainly see the patient much sooner if necessary.

## 2012-08-05 NOTE — Telephone Encounter (Signed)
gv and printed appt sched and avs for pt...gv pt barium...emailed MW to add tx.Marland KitchenMarland Kitchen

## 2012-08-05 NOTE — Telephone Encounter (Signed)
Per staff message and POF I have scheduled appts.  [name]  

## 2012-08-05 NOTE — Patient Instructions (Addendum)
[facility] Discharge Instructions for Patients Receiving Chemotherapy  Today you received the following chemotherapy agents Alimta        To help prevent nausea and vomiting after your treatment, we encourage you to take your nausea medication as perscribed. Begin taking it as directed and take it as often as prescribed for the next 48-72 hours.   If you develop nausea and vomiting that is not controlled by your nausea medication, call the clinic. If it is after clinic hours your family physician or the after hours number for the clinic or go to the Emergency Department.   BELOW ARE SYMPTOMS THAT SHOULD BE REPORTED IMMEDIATELY:  *FEVER GREATER THAN 100.5 F  *CHILLS WITH OR WITHOUT FEVER  NAUSEA AND VOMITING THAT IS NOT CONTROLLED WITH YOUR NAUSEA MEDICATION  *UNUSUAL SHORTNESS OF BREATH  *UNUSUAL BRUISING OR BLEEDING  TENDERNESS IN MOUTH AND THROAT WITH OR WITHOUT PRESENCE OF ULCERS  *URINARY PROBLEMS  *BOWEL PROBLEMS  UNUSUAL RASH Items with * indicate a potential emergency and should be followed up as soon as possible.  One of the nurses will contact you 24 hours after your treatment. Please let the nurse know about any problems that you may have experienced. Feel free to call the clinic you have any questions or concerns. The clinic phone number is [phone].   I have been informed and understand all the instructions given to me. I know to contact the clinic, my physician, or go to the Emergency Department if any problems should occur. I do not have any questions at this time, but understand that I may call the clinic during office hours   should I have any questions or need assistance in obtaining follow up care.    __________________________________________  _____________  __________ Signature of Patient or Authorized Representative            Date                   Time    __________________________________________ Nurse's Signature

## 2012-08-05 NOTE — Patient Instructions (Signed)
Continue chemotherapy with carboplatin and Alimta today as scheduled.  Followup visit in 3 weeks with repeat CT scan of the chest, abdomen and pelvis.

## 2012-08-06 NOTE — Progress Notes (Signed)
  Radiation Oncology         [phone] ________________________________  Name: Melanie Liu MRN: [id number]  Date: 08/04/2012  DOB: 13-Jun-1950  End of Treatment Note  Diagnosis:   61 year old woman with stage IV adenocarcinoma of the right upper lung with a new 4.8 mm right parietal brain metastasis   Indication for treatment:  Palliation       Radiation treatment dates:  08/04/2012  Site/dose/beams/energy:   A relocatable thermoplastic stereotactic head frame was used for precise immobilization. The planning CT images were fused with the patient's targeting 3T 1 mm axial slice MRI scan for target delineation.  Dr. Venetia Maxon from neurosurgery was present for the set-up and delivery.  Right Parietal 4.8 mm target was treated using 4 Circular Arcs to a prescription dose of 20 Gy. The 10 mm circular collimator was used. ExacTrac registration was performed for each couch angle. The 88% isodose line was prescribed.  6 MV x-rays were delivered in the flattening filter free beam mode.  Narrative: The patient tolerated radiation treatment relatively well.   No complications occurred.  Plan: The patient has completed radiation treatment. The patient will return to radiation oncology clinic for routine followup in one month. I advised her to call or return sooner if she has any questions or concerns related to her recovery or treatment. ________________________________  Artist Pais. Kathrynn Running, M.D.

## 2012-08-11 ENCOUNTER — Encounter: Payer: Self-pay | Admitting: Internal Medicine

## 2012-08-11 ENCOUNTER — Other Ambulatory Visit (HOSPITAL_BASED_OUTPATIENT_CLINIC_OR_DEPARTMENT_OTHER): Payer: Medicaid Other

## 2012-08-11 ENCOUNTER — Other Ambulatory Visit (HOSPITAL_COMMUNITY): Payer: Self-pay | Admitting: Family Medicine

## 2012-08-11 DIAGNOSIS — C3490 Malignant neoplasm of unspecified part of unspecified bronchus or lung: Secondary | ICD-10-CM

## 2012-08-11 DIAGNOSIS — Z139 Encounter for screening, unspecified: Secondary | ICD-10-CM

## 2012-08-11 LAB — COMPREHENSIVE METABOLIC PANEL (CC13)
AST: 23 U/L (ref 5–34)
Albumin: 2.9 g/dL — ABNORMAL LOW (ref 3.5–5.0)
BUN: 15.4 mg/dL (ref 7.0–26.0)
Calcium: 10.3 mg/dL (ref 8.4–10.4)
Chloride: 103 mEq/L (ref 98–107)
Glucose: 122 mg/dl — ABNORMAL HIGH (ref 70–99)
Potassium: 3.9 mEq/L (ref 3.5–5.1)
Sodium: 140 mEq/L (ref 136–145)
Total Protein: 7.6 g/dL (ref 6.4–8.3)

## 2012-08-11 LAB — CBC WITH DIFFERENTIAL/PLATELET
BASO%: 0.2 % (ref 0.0–2.0)
EOS%: 0.6 % (ref 0.0–7.0)
MCH: 30.7 pg (ref 25.1–34.0)
MCV: 97.1 fL (ref 79.5–101.0)
MONO%: 4.4 % (ref 0.0–14.0)
NEUT#: 5.6 10*3/uL (ref 1.5–6.5)
RBC: 3.39 10*6/uL — ABNORMAL LOW (ref 3.70–5.45)
RDW: 17.4 % — ABNORMAL HIGH (ref 11.2–14.5)
WBC: 6.5 10*3/uL (ref 3.9–10.3)
lymph#: 0.6 10*3/uL — ABNORMAL LOW (ref 0.9–3.3)
nRBC: 0 % (ref 0–0)

## 2012-08-11 NOTE — Progress Notes (Signed)
Checked in new patient. She no longer has [employer], she now has Medicaid. She has no financial issues and she doesn't have a POA/Living Will

## 2012-08-14 ENCOUNTER — Ambulatory Visit (HOSPITAL_COMMUNITY)
Admission: RE | Admit: 2012-08-14 | Discharge: 2012-08-14 | Disposition: A | Discharge status: Home / Self Care | Payer: Medicaid Other | Source: Ambulatory Visit | Attending: Family Medicine | Admitting: Family Medicine

## 2012-08-14 DIAGNOSIS — Z1231 Encounter for screening mammogram for malignant neoplasm of breast: Secondary | ICD-10-CM | POA: Insufficient documentation

## 2012-08-14 DIAGNOSIS — Z139 Encounter for screening, unspecified: Secondary | ICD-10-CM

## 2012-08-18 ENCOUNTER — Other Ambulatory Visit (HOSPITAL_BASED_OUTPATIENT_CLINIC_OR_DEPARTMENT_OTHER): Payer: Medicaid Other

## 2012-08-18 DIAGNOSIS — C3490 Malignant neoplasm of unspecified part of unspecified bronchus or lung: Secondary | ICD-10-CM

## 2012-08-18 LAB — CBC WITH DIFFERENTIAL/PLATELET
Basophils Absolute: 0 10*3/uL (ref 0.0–0.1)
HCT: 27.3 % — ABNORMAL LOW (ref 34.8–46.6)
HGB: 9 g/dL — ABNORMAL LOW (ref 11.6–15.9)
MCH: 31.6 pg (ref 25.1–34.0)
MCHC: 33 g/dL (ref 31.5–36.0)
MCV: 95.9 fL (ref 79.5–101.0)
NEUT%: 87 % — ABNORMAL HIGH (ref 38.4–76.8)
lymph#: 0.5 10*3/uL — ABNORMAL LOW (ref 0.9–3.3)

## 2012-08-18 LAB — COMPREHENSIVE METABOLIC PANEL (CC13)
ALT: <6 U/L (ref 0–55)
AST: 12 U/L (ref 5–34)
Alkaline Phosphatase: 96 U/L (ref 40–150)
Calcium: 10.5 mg/dL — ABNORMAL HIGH (ref 8.4–10.4)
Chloride: 102 mEq/L (ref 98–107)
Creatinine: 0.8 mg/dL (ref 0.6–1.1)
Potassium: 4.4 mEq/L (ref 3.5–5.1)

## 2012-08-19 ENCOUNTER — Other Ambulatory Visit: Payer: Self-pay | Admitting: Certified Registered Nurse Anesthetist

## 2012-08-20 ENCOUNTER — Telehealth: Payer: Self-pay | Admitting: Radiation Oncology

## 2012-08-20 ENCOUNTER — Encounter: Payer: Self-pay | Admitting: Radiation Oncology

## 2012-08-20 NOTE — Telephone Encounter (Signed)
Informed by Irwin Brakeman that the patient reports chest pain. Phoned patient to check status. Patient reports constant center of chest pain that radiates to right arm down to elbow and right side that has "gotten worse over the last month." Patient reports taking oxycodone/acetaminophen every three hours prn instead of every four hours as prescribed. Patient reports that she only takes decadron on the days before chemotherapy therefore she "won't take those again until Sunday." Also, patient reports taking tussinex prn. Patient reports that she must sit up on her recliner to sleep. She reports that she is unable to lay flat or on either side because "the pain overrides her pain medication." Explained to the patient this pain could be associated with the tumor in the center of her chest or be cardiac in origin. Encouraged patient to present to her PCP or the emergency department for evaluation but, patient insisted on "waiting till Friday (when her scan is scheduled)." Patient questions if "one of her doctors can call in a script for some stronger pain medication so she can do the scan." Informed patient this writer would contact her physician team and be back in touch.

## 2012-08-22 ENCOUNTER — Ambulatory Visit (HOSPITAL_COMMUNITY): Admission: RE | Admit: 2012-08-22 | Payer: Self-pay | Source: Ambulatory Visit

## 2012-08-22 ENCOUNTER — Ambulatory Visit (HOSPITAL_COMMUNITY)
Admission: RE | Admit: 2012-08-22 | Discharge: 2012-08-22 | Disposition: A | Discharge status: Home / Self Care | Payer: Medicaid Other | Source: Ambulatory Visit | Attending: Internal Medicine | Admitting: Internal Medicine

## 2012-08-22 DIAGNOSIS — Z90710 Acquired absence of both cervix and uterus: Secondary | ICD-10-CM | POA: Insufficient documentation

## 2012-08-22 DIAGNOSIS — Z923 Personal history of irradiation: Secondary | ICD-10-CM | POA: Insufficient documentation

## 2012-08-22 DIAGNOSIS — Z79899 Other long term (current) drug therapy: Secondary | ICD-10-CM | POA: Insufficient documentation

## 2012-08-22 DIAGNOSIS — K4020 Bilateral inguinal hernia, without obstruction or gangrene, not specified as recurrent: Secondary | ICD-10-CM | POA: Insufficient documentation

## 2012-08-22 DIAGNOSIS — C3490 Malignant neoplasm of unspecified part of unspecified bronchus or lung: Secondary | ICD-10-CM | POA: Insufficient documentation

## 2012-08-22 DIAGNOSIS — K7689 Other specified diseases of liver: Secondary | ICD-10-CM | POA: Insufficient documentation

## 2012-08-22 DIAGNOSIS — J90 Pleural effusion, not elsewhere classified: Secondary | ICD-10-CM | POA: Insufficient documentation

## 2012-08-22 DIAGNOSIS — I319 Disease of pericardium, unspecified: Secondary | ICD-10-CM | POA: Insufficient documentation

## 2012-08-22 DIAGNOSIS — I700 Atherosclerosis of aorta: Secondary | ICD-10-CM | POA: Insufficient documentation

## 2012-08-22 DIAGNOSIS — K8020 Calculus of gallbladder without cholecystitis without obstruction: Secondary | ICD-10-CM | POA: Insufficient documentation

## 2012-08-22 MED ORDER — IOHEXOL 300 MG/ML  SOLN
100.0000 mL | Freq: Once | INTRAMUSCULAR | Status: AC | PRN
  Administered 2012-08-22: 100 mL via INTRAVENOUS

## 2012-08-25 ENCOUNTER — Telehealth: Payer: Self-pay | Admitting: Internal Medicine

## 2012-08-25 ENCOUNTER — Encounter: Payer: Self-pay | Admitting: *Deleted

## 2012-08-25 ENCOUNTER — Other Ambulatory Visit: Payer: Self-pay | Admitting: Lab

## 2012-08-25 ENCOUNTER — Ambulatory Visit: Payer: Self-pay

## 2012-08-25 ENCOUNTER — Encounter: Payer: Self-pay | Admitting: Internal Medicine

## 2012-08-25 ENCOUNTER — Ambulatory Visit (HOSPITAL_BASED_OUTPATIENT_CLINIC_OR_DEPARTMENT_OTHER): Payer: Medicaid Other | Admitting: Internal Medicine

## 2012-08-25 VITALS — BP 111/78 | HR 98 | Temp 97.0°F | Resp 18 | Ht 62.0 in | Wt 103.4 lb

## 2012-08-25 DIAGNOSIS — C7931 Secondary malignant neoplasm of brain: Secondary | ICD-10-CM

## 2012-08-25 DIAGNOSIS — R918 Other nonspecific abnormal finding of lung field: Secondary | ICD-10-CM

## 2012-08-25 DIAGNOSIS — I319 Disease of pericardium, unspecified: Secondary | ICD-10-CM

## 2012-08-25 DIAGNOSIS — R222 Localized swelling, mass and lump, trunk: Secondary | ICD-10-CM

## 2012-08-25 DIAGNOSIS — C7949 Secondary malignant neoplasm of other parts of nervous system: Secondary | ICD-10-CM

## 2012-08-25 DIAGNOSIS — D6481 Anemia due to antineoplastic chemotherapy: Secondary | ICD-10-CM

## 2012-08-25 DIAGNOSIS — I313 Pericardial effusion (noninflammatory): Secondary | ICD-10-CM

## 2012-08-25 MED ORDER — OXYCODONE-ACETAMINOPHEN 5-325 MG PO TABS: 1.0000 | ORAL_TABLET | ORAL | Status: DC | PRN

## 2012-08-25 NOTE — Patient Instructions (Signed)
Referred to cardiology for evaluation of the pericardial effusion. Followup visit in 2 weeks for evaluation and discussion of future treatment options.

## 2012-08-25 NOTE — Progress Notes (Signed)
Smoking 1/2 pack cigarettes /day.

## 2012-08-25 NOTE — Telephone Encounter (Signed)
S.w. Melanie Liu @ [facility] and they did not have any appts w/in 2weeks she will give info to nurse and she will contact pt and me with d/t of appt.Marland KitchenMarland KitchenMarland KitchenMarland Kitchenpt aware

## 2012-08-25 NOTE — Progress Notes (Signed)
08/25/2012 See Consent Form Encounter for notes related to informed consent. Following completion of informed consent process today, patient proceeded to complete the Baseline Patient reported outcomes (PROs), including the EQ-5D-3L and lung cancer symptom scale (LCSS), prior to MD appointment. Upon completion, patient identifiers were added to the EQ-5D-3L questionnaire. Plans made to collect baseline blood sample at today's infusion visit, if possible. Patient is aware that tumor tissue will be requested from the pathology department, if a sufficient sample exists. Thanked patient for her participation in the research study.  Received notification from Dr. Asa Lente nurse, Vincent Peyer, that patient's treatment today has been cancelled, therefore baseline samples will not be collected at this time. Patient is aware that collection will occur at the next scheduled lab visit, and that no additional visits will be required for collection of the blood samples.  Cindy S. Clelia Croft BSN, RN, CCRP 08/25/2012 10:35 AM

## 2012-08-25 NOTE — Telephone Encounter (Signed)
Melissa from [facility] called back and advised that pt is sched for 7.2.14 @ 9:45am...she will send pt new pt informat

## 2012-08-25 NOTE — Progress Notes (Signed)
[facility] [phone]   Fax:([phone]) [phone]  OFFICE PROGRESS NOTE  [name], MD [address] [place] [address]  DIAGNOSIS: Metastatic non-small cell lung cancer, adenocarcinoma with negative EGFR mutation and negative ALK gene translocation diagnosed in September of 2013 with metastatic single brain lesion.   PRIOR THERAPY:  1) Status post stereotactic radiosurgery to the single brain lesion under the care Dr. Kathrynn Running.  2) Concurrent chemoradiation with chemotherapy in the form of weekly carboplatin for an AUC of 2 and paclitaxel at 45 mg per meter squared concurrent with radiation therapy under the care Dr. Kathrynn Running with partial response in her disease. 3) Systemic chemotherapy with carboplatin for AUC of 5 and Alimta 500 mg/M2 every 3 weeks. From cycle 3 forward she is receiving carboplatin for an AUC of 4 and Alimta at 375 mg per meter squared due to thrombocytopenia. Status post a total of 6 cycles   CURRENT THERAPY:  None  INTERVAL HISTORY: Melanie Liu 62 y.o. female returns to the clinic today for followup visit accompanied by her daughter. The patient is feeling fine today with no specific complaints. She tolerated the last cycle of her systemic chemotherapy fairly well with no significant adverse effect except for increasing fatigue and shortness of breath with exertion as well as swelling of the lower extremities. She denied having any significant weight loss or night sweats. She had no nausea or vomiting. The patient had repeat CT scan of the chest, abdomen and pelvis performed recently and she is here for evaluation and discussion of her scan results.  MEDICAL HISTORY: Past Medical History  Diagnosis Date  . Nodule of right lung     right upper lobe  . Anemia   . Rectovaginal fistula   . Right frontal lobe lesion 12/13/11    CT head  . History of radiation therapy 12/24/11-02/08/12    rul 66Gy/22fxs  . Dysphagia   .  History of radiation therapy 12/27/11    SRS rt ant frontal 20gy  . Lung cancer 11/23/11    biopsy-right  . Brain metastases     ALLERGIES:  is allergic to carboplatin and levaquin.  MEDICATIONS:  Current Outpatient Prescriptions  Medication Sig Dispense Refill  . Alum & Mag Hydroxide-Simeth (GI COCKTAIL) SUSP suspension Take 30 mLs by mouth 2 (two) times daily with a meal. Shake well.  300 mL  0  . calcium-vitamin D (OSCAL WITH D) 500-200 MG-UNIT per tablet Take 1 tablet by mouth daily.      [name] dexamethasone (DECADRON) 4 MG tablet Take 4 mg by mouth 2 (two) times daily.      [name] emollient (BIAFINE) cream Apply topically 2 (two) times daily.      . folic acid (FOLVITE) 1 MG tablet Take 1 tablet (1 mg total) by mouth daily.  30 tablet  5  . glucosamine-chondroitin 500-400 MG tablet Take 1 tablet by mouth daily.       [name] guaiFENesin (ROBITUSSIN) 100 MG/5ML SOLN Take 20 mLs (400 mg total) by mouth every 6 (six) hours as needed.  1200 mL  0  . omeprazole (PRILOSEC) 40 MG capsule Take 1 capsule (40 mg total) by mouth daily.  30 capsule  0  . oxyCODONE-acetaminophen (PERCOCET/ROXICET) 5-325 MG per tablet Take 1 tablet by mouth every 4 (four) hours as needed for pain.  60 tablet  0  . polyethylene glycol (MIRALAX / GLYCOLAX) packet Take 17 g by mouth daily.  14 each  3  . PRESCRIPTION MEDICATION Place into the left eye every 30 (thirty) days. Bevacizumab (Avastin) inj into the left eye      . PRESCRIPTION MEDICATION [facility]. Last treatment was on 04-10-12. Pt's on an every 3 week cycle. Pt was supposed to have a chemo treatment on Monday  04-28-12 but that treatment was cancelled. Followed by Dr Legrand Como.      . sucralfate (CARAFATE) 1 G tablet Take 1 tablet (1 g total) by mouth 3 (three) times a week. 5 min before food  90 tablet  0  . vitamin E 400 UNIT capsule Take 400 Units by mouth daily.      [name] albuterol (PROVENTIL) (5 MG/ML) 0.5% nebulizer solution Take 0.5 mLs (2.5 mg total) by  nebulization every 4 (four) hours as needed for wheezing or shortness of breath.  20 mL  5  . chlorpheniramine-HYDROcodone (TUSSIONEX PENNKINETIC ER) 10-8 MG/5ML LQCR Take 5 mLs by mouth every 12 (twelve) hours as needed. [place] [facility] [place] [facility] [phone]      . ipratropium (ATROVENT) 0.02 % nebulizer solution Take 2.5 mLs (0.5 mg total) by nebulization every 4 (four) hours as needed (pt mixes with albuterol).  75 mL  5  . promethazine (PHENERGAN) 25 MG tablet Take 1 to 2 tablets by mouth every 6 hours as needed for nausea  30 tablet  1   No current facility-administered medications for this visit.    SURGICAL HISTORY:  Past Surgical History  Procedure Laterality Date  . Partial hysterectomy    . Hemmorhoid surgery    . Neck surgery    . Retinal detachment surgery    . Esophagogastroduodenoscopy N/A 05/01/2012    Procedure: ESOPHAGOGASTRODUODENOSCOPY (EGD);  Surgeon: Shirley Friar, MD;  Location: [facility];  Service: Endoscopy;  Laterality: N/A;    REVIEW OF SYSTEMS:  A comprehensive review of systems was negative except for: Constitutional: positive for fatigue Respiratory: positive for dyspnea on exertion Swelling of the lower extremities   PHYSICAL EXAMINATION: General appearance: alert, cooperative, fatigued and no distress Head: Normocephalic, without obvious abnormality, atraumatic Neck: no adenopathy Lymph nodes: Cervical, supraclavicular, and axillary nodes normal. Resp: diminished breath sounds bilaterally Cardio: regular rate and rhythm, S1, S2 normal, no murmur, click, rub or gallop GI: soft, non-tender; bowel sounds normal; no masses,  no organomegaly Extremities: extremities 1+ edema Neurologic: Alert and oriented X 3, normal strength and tone. Normal symmetric reflexes. Normal coordination and gait  ECOG PERFORMANCE STATUS: 1 - Symptomatic but completely ambulatory  Blood pressure 111/78, pulse 98, temperature 97 F (36.1 C), temperature  source Oral, resp. rate 18, height 5\' 2"  (1.575 m), weight 103 lb 6.4 oz (46.902 kg).  LABORATORY DATA: Lab Results  Component Value Date   WBC 14.5* 08/18/2012   HGB 9.0* 08/18/2012   HCT 27.3* 08/18/2012   MCV 95.9 08/18/2012   PLT 173 08/18/2012      Chemistry      Component Value Date/Time   NA 139 08/18/2012 0938   NA 133* 05/01/2012 0525   K 4.4 08/18/2012 0938   K 4.3 05/01/2012 0525   CL 102 08/18/2012 0938   CL 99 05/01/2012 0525   CO2 26 08/18/2012 0938   CO2 23 05/01/2012 0525   BUN 11.8 08/18/2012 0938   BUN 23 05/01/2012 0525   CREATININE 0.8 08/18/2012 0938   CREATININE 0.64 05/01/2012 0525      Component Value Date/Time   CALCIUM 10.5* 08/18/2012  1610   CALCIUM 8.9 05/01/2012 0525   ALKPHOS 96 08/18/2012 0938   ALKPHOS 76 04/30/2012 1240   AST 12 08/18/2012 0938   AST 21 04/30/2012 1240   ALT <6 Repeated and Verified 08/18/2012 0938   ALT 17 04/30/2012 1240   BILITOT 0.44 08/18/2012 0938   BILITOT 0.4 04/30/2012 1240       RADIOGRAPHIC STUDIES: Ct Chest W Contrast  08/22/2012   *RADIOLOGY REPORT*  Clinical Data:  Lung cancer diagnosed 11/2011, XRT complete, chemotherapy in progress, shortness of breath  CT CHEST, ABDOMEN AND PELVIS WITH CONTRAST  Technique:  Multidetector CT imaging of the chest, abdomen and pelvis was performed following the standard protocol during bolus administration of intravenous contrast.  Contrast: OMNIPAQUE IOHEXOL 300 MG/ML  SOLN  Comparison:  05/23/2012    CT CHEST  Findings:  6.1 x 6.1 cm posterior right upper lobe mass, grossly unchanged.  Associated medial right upper lobe consolidation (series 5/image 16), likely reflecting radiation changes.  4 mm subpleural nodule in the right lower lobe (series 5/image 29), new.  Underlying moderate to severe centrilobular emphysematous changes.  Small right and trace left pleural effusions.  No pneumothorax.  Heart is normal in size.  Moderate pericardial effusion, new.  No suspicious mediastinal, hilar, or  axillary lymphadenopathy.  Mild degenerative changes of the thoracic spine.  IMPRESSION: 6.1 cm posterior right upper lobe mass, corresponding to known lung cancer, grossly unchanged.  Associated medial right upper lobe consolidation, likely reflecting radiation changes.  4 mm subpleural nodule in the right lower lobe, new, worrisome for metastasis.  Attention on follow-up suggested.  Moderate pericardial effusion, new.  Correlate for tamponade physiology.  Small right and trace left pleural effusions.    CT ABDOMEN AND PELVIS  Findings:  11 x 9 mm hypoenhancing lesion in the medial segment left hepatic lobe (series 2/image 64), new.  7 x 7 mm hypoenhancing lesion in the posterior segment right hepatic lobe (series 2/image 69), previously 3 x 4 mm.  Given the interval change, both lesions are worrisome for hepatic metastases.  Spleen, pancreas, and adrenal glands are within normal limits.  Cholelithiasis.  Enlarging cystic lesion along the gallbladder fundus, likely reflecting a pseudodiverticulum (coronal image 36), less likely adenomyomatosis.  Kidneys are within normal limits.  No hydronephrosis.  No evidence of bowel obstruction.  Atherosclerotic calcifications of the abdominal aorta and branch vessels.  No abdominopelvic ascites.  No suspicious abdominopelvic lymphadenopathy.  Status post hysterectomy.  No adnexal masses.  Bladder is underdistended.  Small left and tiny right back fat-containing inguinal hernias.  Mild degenerative changes of the lumbar spine.  IMPRESSION: Two hepatic lesions measuring up to 11 mm, as described above, worrisome for metastases.  Consider MRI abdomen with/without contrast for further evaluation.  Alternatively, this can be evaluated at follow-up.  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   Original Report Authenticated By: Charline Bills, M.D.   ASSESSMENT AND PLAN: This is a very pleasant 62 years old white female with  metastatic non-small cell lung cancer status post concurrent chemoradiation followed by 6 cycles of systemic chemotherapy with carboplatin and Alimta but unfortunately the patient has evidence for disease progression on his recent scan with stable right upper lobe lung nodule but pericardial effusion with the risk of cardiac tamponade. She also has 2 new liver lesions on the recent scan. I have a lengthy discussion with the patient and showed them the images of the scan. I recommended for referral to  cardiology for urgent evaluation of the pericardial effusion and to rule out cardiac tamponade. I recommended for the patient to discontinue treatment with carboplatin and Alimta at this point. I would consider the patient for treatment with second line chemotherapy in the form of docetaxel oral Tarceva. I will discuss this option with her medications in a followup visit in 2 weeks after management of her pericardial effusion. The patient agreed to the current plan. She was advised to call immediately if she has any concerning symptoms in the interval.  All questions were answered. The patient knows to call the clinic with any problems, questions or concerns. We can certainly see the patient much sooner if necessary.  I spent 15 minutes counseling the patient face to face. The total time spent in the appointment was 25 minutes.

## 2012-08-26 DIAGNOSIS — R079 Chest pain, unspecified: Secondary | ICD-10-CM

## 2012-08-26 HISTORY — DX: Chest pain, unspecified: R07.9

## 2012-08-27 ENCOUNTER — Encounter (HOSPITAL_COMMUNITY): Payer: Self-pay | Admitting: *Deleted

## 2012-08-27 ENCOUNTER — Other Ambulatory Visit: Payer: Self-pay

## 2012-08-27 ENCOUNTER — Inpatient Hospital Stay (HOSPITAL_COMMUNITY): Payer: Medicaid Other

## 2012-08-27 ENCOUNTER — Other Ambulatory Visit: Payer: Self-pay | Admitting: *Deleted

## 2012-08-27 ENCOUNTER — Inpatient Hospital Stay (HOSPITAL_COMMUNITY)
Admission: EM | Admit: 2012-08-27 | Discharge: 2012-09-01 | DRG: 164 | Disposition: A | Discharge status: Home / Self Care | Payer: Medicaid Other | Attending: Cardiothoracic Surgery | Admitting: Cardiothoracic Surgery

## 2012-08-27 ENCOUNTER — Emergency Department (HOSPITAL_COMMUNITY): Payer: Medicaid Other

## 2012-08-27 DIAGNOSIS — C7931 Secondary malignant neoplasm of brain: Secondary | ICD-10-CM | POA: Diagnosis present

## 2012-08-27 DIAGNOSIS — IMO0002 Reserved for concepts with insufficient information to code with codable children: Secondary | ICD-10-CM

## 2012-08-27 DIAGNOSIS — F17200 Nicotine dependence, unspecified, uncomplicated: Secondary | ICD-10-CM | POA: Diagnosis present

## 2012-08-27 DIAGNOSIS — Z85118 Personal history of other malignant neoplasm of bronchus and lung: Secondary | ICD-10-CM

## 2012-08-27 DIAGNOSIS — C7949 Secondary malignant neoplasm of other parts of nervous system: Secondary | ICD-10-CM

## 2012-08-27 DIAGNOSIS — I313 Pericardial effusion (noninflammatory): Secondary | ICD-10-CM

## 2012-08-27 DIAGNOSIS — J449 Chronic obstructive pulmonary disease, unspecified: Secondary | ICD-10-CM | POA: Diagnosis present

## 2012-08-27 DIAGNOSIS — C3490 Malignant neoplasm of unspecified part of unspecified bronchus or lung: Secondary | ICD-10-CM | POA: Diagnosis present

## 2012-08-27 DIAGNOSIS — Q248 Other specified congenital malformations of heart: Secondary | ICD-10-CM

## 2012-08-27 DIAGNOSIS — D649 Anemia, unspecified: Secondary | ICD-10-CM | POA: Diagnosis present

## 2012-08-27 DIAGNOSIS — Z923 Personal history of irradiation: Secondary | ICD-10-CM

## 2012-08-27 DIAGNOSIS — R1310 Dysphagia, unspecified: Secondary | ICD-10-CM | POA: Diagnosis present

## 2012-08-27 DIAGNOSIS — R079 Chest pain, unspecified: Secondary | ICD-10-CM | POA: Diagnosis present

## 2012-08-27 DIAGNOSIS — I959 Hypotension, unspecified: Secondary | ICD-10-CM | POA: Diagnosis present

## 2012-08-27 DIAGNOSIS — J90 Pleural effusion, not elsewhere classified: Principal | ICD-10-CM | POA: Diagnosis present

## 2012-08-27 DIAGNOSIS — J4489 Other specified chronic obstructive pulmonary disease: Secondary | ICD-10-CM | POA: Diagnosis present

## 2012-08-27 DIAGNOSIS — I319 Disease of pericardium, unspecified: Secondary | ICD-10-CM | POA: Diagnosis present

## 2012-08-27 DIAGNOSIS — R918 Other nonspecific abnormal finding of lung field: Secondary | ICD-10-CM | POA: Diagnosis present

## 2012-08-27 DIAGNOSIS — I309 Acute pericarditis, unspecified: Secondary | ICD-10-CM

## 2012-08-27 DIAGNOSIS — I3139 Other pericardial effusion (noninflammatory): Secondary | ICD-10-CM

## 2012-08-27 DIAGNOSIS — K222 Esophageal obstruction: Secondary | ICD-10-CM | POA: Diagnosis present

## 2012-08-27 DIAGNOSIS — I519 Heart disease, unspecified: Secondary | ICD-10-CM

## 2012-08-27 HISTORY — DX: Shortness of breath: R06.02

## 2012-08-27 HISTORY — DX: Chronic obstructive pulmonary disease, unspecified: J44.9

## 2012-08-27 HISTORY — DX: Chest pain, unspecified: R07.9

## 2012-08-27 LAB — POCT I-STAT 3, ART BLOOD GAS (G3+)
Acid-Base Excess: 3 mmol/L — ABNORMAL HIGH (ref 0.0–2.0)
Patient temperature: 97.7

## 2012-08-27 LAB — URINALYSIS, ROUTINE W REFLEX MICROSCOPIC
Bilirubin Urine: NEGATIVE
Glucose, UA: NEGATIVE mg/dL
Hgb urine dipstick: NEGATIVE
Ketones, ur: NEGATIVE mg/dL
Leukocytes, UA: NEGATIVE
Nitrite: NEGATIVE
Protein, ur: NEGATIVE mg/dL
Specific Gravity, Urine: 1.02 (ref 1.005–1.030)
Urobilinogen, UA: 0.2 mg/dL (ref 0.0–1.0)
pH: 5.5 (ref 5.0–8.0)

## 2012-08-27 LAB — COMPREHENSIVE METABOLIC PANEL
ALT: <5 U/L (ref 0–35)
AST: 11 U/L (ref 0–37)
Albumin: 2.6 g/dL — ABNORMAL LOW (ref 3.5–5.2)
Alkaline Phosphatase: 100 U/L (ref 39–117)
BUN: 11 mg/dL (ref 6–23)
CO2: 30 mEq/L (ref 19–32)
Calcium: 9.6 mg/dL (ref 8.4–10.5)
Chloride: 97 mEq/L (ref 96–112)
Creatinine, Ser: 0.76 mg/dL (ref 0.50–1.10)
GFR calc Af Amer: >90 mL/min (ref >90)
GFR calc non Af Amer: 90 mL/min — ABNORMAL LOW (ref >90)
Glucose, Bld: 86 mg/dL (ref 70–99)
Potassium: 4.5 mEq/L (ref 3.5–5.1)
Sodium: 137 mEq/L (ref 135–145)
Total Bilirubin: 0.2 mg/dL — ABNORMAL LOW (ref 0.3–1.2)
Total Protein: 6.5 g/dL (ref 6.0–8.3)

## 2012-08-27 LAB — TROPONIN I: Troponin I: <0.3 ng/mL (ref <0.30)

## 2012-08-27 LAB — TSH: TSH: 3.323 u[IU]/mL (ref 0.350–4.500)

## 2012-08-27 LAB — CBC
HCT: 28.1 % — ABNORMAL LOW (ref 36.0–46.0)
HCT: 27.1 % — ABNORMAL LOW (ref 36.0–46.0)
Hemoglobin: 8.3 g/dL — ABNORMAL LOW (ref 12.0–15.0)
Hemoglobin: 8 g/dL — ABNORMAL LOW (ref 12.0–15.0)
Hemoglobin: 8.3 g/dL — ABNORMAL LOW (ref 12.0–15.0)
MCH: 29.1 pg (ref 26.0–34.0)
MCH: 30 pg (ref 26.0–34.0)
MCHC: 29.5 g/dL — ABNORMAL LOW (ref 30.0–36.0)
MCHC: 30.6 g/dL (ref 30.0–36.0)
MCV: 97.8 fL (ref 78.0–100.0)
Platelets: 452 10*3/uL — ABNORMAL HIGH (ref 150–400)
Platelets: 481 10*3/uL — ABNORMAL HIGH (ref 150–400)
RBC: 2.85 MIL/uL — ABNORMAL LOW (ref 3.87–5.11)
RBC: 2.67 MIL/uL — ABNORMAL LOW (ref 3.87–5.11)
RBC: 2.77 MIL/uL — ABNORMAL LOW (ref 3.87–5.11)
RDW: 18.6 % — ABNORMAL HIGH (ref 11.5–15.5)
WBC: 8.1 10*3/uL (ref 4.0–10.5)

## 2012-08-27 LAB — CREATININE, SERUM
Creatinine, Ser: 0.71 mg/dL (ref 0.50–1.10)
GFR calc non Af Amer: >90 mL/min (ref >90)

## 2012-08-27 LAB — POCT I-STAT, CHEM 8
BUN: 13 mg/dL (ref 6–23)
Calcium, Ion: 1.27 mmol/L (ref 1.13–1.30)
Chloride: 104 mEq/L (ref 96–112)
Creatinine, Ser: 0.8 mg/dL (ref 0.50–1.10)
HCT: 28 % — ABNORMAL LOW (ref 36.0–46.0)
Hemoglobin: 9.5 g/dL — ABNORMAL LOW (ref 12.0–15.0)
Sodium: 138 mEq/L (ref 135–145)
TCO2: 26 mmol/L (ref 0–100)

## 2012-08-27 LAB — APTT: aPTT: 41 seconds — ABNORMAL HIGH (ref 24–37)

## 2012-08-27 LAB — PREPARE RBC (CROSSMATCH)

## 2012-08-27 LAB — PROTIME-INR
INR: 1.1 (ref 0.00–1.49)
Prothrombin Time: 14 seconds (ref 11.6–15.2)

## 2012-08-27 LAB — MRSA PCR SCREENING: MRSA by PCR: NEGATIVE

## 2012-08-27 LAB — SURGICAL PCR SCREEN
MRSA, PCR: NEGATIVE
Staphylococcus aureus: NEGATIVE

## 2012-08-27 MED ORDER — FOLIC ACID 1 MG PO TABS
1.0000 mg | ORAL_TABLET | Freq: Every day | ORAL | Status: DC
  Administered 2012-08-27 – 2012-09-01 (×5): 1 mg via ORAL
  Filled 2012-08-27 (×6): qty 1

## 2012-08-27 MED ORDER — PANTOPRAZOLE SODIUM 40 MG PO TBEC
40.0000 mg | DELAYED_RELEASE_TABLET | Freq: Every day | ORAL | Status: DC
  Administered 2012-08-27: 40 mg via ORAL
  Filled 2012-08-27: qty 1

## 2012-08-27 MED ORDER — DEXTROSE 5 % IV SOLN
1.5000 g | INTRAVENOUS | Status: AC
  Administered 2012-08-28: 1.5 g via INTRAVENOUS
  Filled 2012-08-27: qty 1.5

## 2012-08-27 MED ORDER — HYDROCORTISONE SOD SUCCINATE 100 MG IJ SOLR
100.0000 mg | Freq: Once | INTRAMUSCULAR | Status: DC
  Filled 2012-08-27 (×2): qty 2

## 2012-08-27 MED ORDER — SODIUM CHLORIDE 0.9 % IV SOLN
INTRAVENOUS | Status: DC
  Administered 2012-08-27: 08:00:00 via INTRAVENOUS

## 2012-08-27 MED ORDER — SODIUM CHLORIDE 0.9 % IJ SOLN: 3.0000 mL | Freq: Two times a day (BID) | INTRAMUSCULAR | Status: DC

## 2012-08-27 MED ORDER — SUCRALFATE 1 G PO TABS
1.0000 g | ORAL_TABLET | Freq: Every day | ORAL | Status: DC
  Administered 2012-08-27 – 2012-09-01 (×5): 1 g via ORAL
  Filled 2012-08-27 (×7): qty 1

## 2012-08-27 MED ORDER — DEXAMETHASONE 4 MG PO TABS
4.0000 mg | ORAL_TABLET | Freq: Two times a day (BID) | ORAL | Status: DC
  Administered 2012-08-27: 4 mg via ORAL
  Filled 2012-08-27 (×4): qty 1

## 2012-08-27 MED ORDER — SUCRALFATE 1 G PO TABS: 1.0000 g | ORAL_TABLET | ORAL | Status: DC

## 2012-08-27 MED ORDER — IPRATROPIUM BROMIDE 0.02 % IN SOLN: 0.5000 mg | RESPIRATORY_TRACT | Status: DC | PRN

## 2012-08-27 MED ORDER — ONDANSETRON HCL 4 MG/2ML IJ SOLN
4.0000 mg | Freq: Once | INTRAMUSCULAR | Status: AC
  Administered 2012-08-27: 4 mg via INTRAVENOUS
  Filled 2012-08-27: qty 2

## 2012-08-27 MED ORDER — OXYCODONE-ACETAMINOPHEN 5-325 MG PO TABS: 1.0000 | ORAL_TABLET | ORAL | Status: DC | PRN

## 2012-08-27 MED ORDER — ENOXAPARIN SODIUM 40 MG/0.4ML ~~LOC~~ SOLN
40.0000 mg | SUBCUTANEOUS | Status: DC
  Administered 2012-08-27: 40 mg via SUBCUTANEOUS
  Filled 2012-08-27 (×2): qty 0.4

## 2012-08-27 MED ORDER — IOHEXOL 350 MG/ML SOLN
80.0000 mL | Freq: Once | INTRAVENOUS | Status: AC | PRN
  Administered 2012-08-27: 80 mL via INTRAVENOUS

## 2012-08-27 MED ORDER — POLYETHYLENE GLYCOL 3350 17 G PO PACK
17.0000 g | PACK | Freq: Every day | ORAL | Status: DC
  Filled 2012-08-27 (×2): qty 1

## 2012-08-27 MED ORDER — GLUCOSAMINE-CHONDROITIN 500-400 MG PO TABS: 1.0000 | ORAL_TABLET | Freq: Every day | ORAL | Status: DC

## 2012-08-27 MED ORDER — MORPHINE SULFATE 2 MG/ML IJ SOLN
2.0000 mg | INTRAMUSCULAR | Status: DC | PRN
  Administered 2012-08-27 – 2012-08-28 (×4): 2 mg via INTRAVENOUS
  Filled 2012-08-27 (×5): qty 1

## 2012-08-27 MED ORDER — HYDROCORTISONE SOD SUCCINATE 100 MG PF FOR IT USE
100.0000 mg | Freq: Once | INTRAMUSCULAR | Status: DC
  Filled 2012-08-27: qty 1

## 2012-08-27 MED ORDER — ALBUTEROL SULFATE (5 MG/ML) 0.5% IN NEBU: 2.5000 mg | INHALATION_SOLUTION | RESPIRATORY_TRACT | Status: DC | PRN

## 2012-08-27 NOTE — ED Notes (Signed)
Patient is alert and oriented x3.  She is complaining of chest tightness with shortness of breath. She states that she was told by her Chemo MD that she has a lot of fluid around her heart. She currently denies any pain.

## 2012-08-27 NOTE — Progress Notes (Signed)
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 LV ejection fraction is 45%. There is no evidence of classic tamponade. She is currently monitored in the CCU with stable vital signs with a heart rate 104 per minute  Objective: Vital signs in last 24 hours: Temp:  [97.7 F (36.5 C)-98.4 F (36.9 C)] 97.7 F (36.5 C) (06/25 1600) Pulse Rate:  [97-116] 97 (06/25 0715) Cardiac Rhythm:  [-] Normal sinus rhythm (06/25 2030) Resp:  [14-26] 21 (06/25 1600) BP: (85-132)/(51-108) 132/108 mmHg (06/25 1600) SpO2:  [97 %-100 %] 100 % (06/25 1600) Weight:  [104 lb 11.5 oz (47.5 kg)] 104 lb 11.5 oz (47.5 kg) (06/25 0715)  Hemodynamic parameters for last 24 hours:   short of breath but stable on room air  Intake/Output from previous day:   Intake/Output this shift: Total I/O In: 100 [I.V.:100] Out: -   Exam Gen.-chronically ill Caucasian female lying at 30 in bed in the CCU HEENT normocephalic Thorax diminished breath sounds right Cardiac sinus tachycardia no murmur or pericardial rub Extremities palpable pulses with minimal edema Neuro no focal motor deficit Abdomen soft nontender Lab Results:  Recent Labs  08/27/12 0825 08/27/12 1615  WBC 9.6 8.1  HGB 8.0* 8.3*  HCT 26.1* 27.1*  PLT 452* 481*   BMET:  Recent Labs  08/27/12 0328 08/27/12 0825 08/27/12 1615  NA 138  --  137  K 4.1  --  4.5  CL 104  --  97  CO2  --   --  30  GLUCOSE 91  --  86  BUN 13  --  11  CREATININE 0.80 0.71 0.76  CALCIUM  --   --   9.6    PT/INR:  Recent Labs  08/27/12 1615  LABPROT 14.0  INR 1.10   ABG    Component Value Date/Time   PHART 7.432 08/27/2012 1713   HCO3 27.2* 08/27/2012 1713   TCO2 28 08/27/2012 1713   O2SAT 96.0 08/27/2012 1713   CBG (last 3)  No results found for this basename: GLUCAP,  in the last 72 hours  Assessment/Plan: S/P Procedure(s) (LRB): SUBXYPHOID PERICARDIAL WINDOW (N/A) INTRAOPERATIVE TRANSESOPHAGEAL ECHOCARDIOGRAM (N/A) Large pericardial effusion in this 62 year old female with stage IV carcinoma the right upper lobe with recent radiation therapy. We'll proceed with subxiphoid pericardial window and biopsy of pericardium in a.m. Procedure benefits risks and expected postoperative recovery reviewed with patient and she understands and agrees to proceed.   LOS: 0 days    VAN TRIGT III,Delora Gravatt 08/27/2012

## 2012-08-27 NOTE — ED Provider Notes (Signed)
History    CSN: [id number] Arrival date & time 08/27/12  0122  First MD Initiated Contact with Patient 08/27/12 0143     Chief Complaint  Patient presents with  . Chest Pain  . Shortness of Breath   (Consider location/radiation/quality/duration/timing/severity/associated sxs/prior Treatment) HPI Hx per PT - has lung cancer, going through chemo and missed her chemo last week due to new onset chest tightness and "fluid on my heart".  She is scheduled to see [facility] cardiology prior to getting any further cancer treatment. A week ago developed inc DOE and ankle swelling. It is getting worse tonight.  Unable to lay down flat.  Symptoms mod in severity, improved while at rest. No radiation of pain.     Past Medical History  Diagnosis Date  . Nodule of right lung     right upper lobe  . Anemia   . Rectovaginal fistula   . Right frontal lobe lesion 12/13/11    CT head  . History of radiation therapy 12/24/11-02/08/12    rul 66Gy/63fxs  . Dysphagia   . History of radiation therapy 12/27/11    SRS rt ant frontal 20gy  . Lung cancer 11/23/11    biopsy-right  . Brain metastases    Past Surgical History  Procedure Laterality Date  . Partial hysterectomy    . Hemmorhoid surgery    . Neck surgery    . Retinal detachment surgery    . Esophagogastroduodenoscopy N/A 05/01/2012    Procedure: ESOPHAGOGASTRODUODENOSCOPY (EGD);  Surgeon: Shirley Friar, MD;  Location: [facility];  Service: Endoscopy;  Laterality: N/A;   Family History  Problem Relation Age of Onset  . Heart disease Father   . Kidney disease Mother   . Alzheimer's disease Sister   . Alzheimer's disease Brother    History  Substance Use Topics  . Smoking status: Current Every Day Smoker -- 1.00 packs/day for 43 years    Types: Cigarettes  . Smokeless tobacco: Never Used  . Alcohol Use: No   OB History   Grav Para Term Preterm Abortions TAB SAB Ect Mult Living                 Review of Systems   Constitutional: Negative for fever and chills.  HENT: Negative for neck pain and neck stiffness.   Eyes: Negative for visual disturbance.  Respiratory: Positive for shortness of breath.   Cardiovascular: Positive for chest pain.  Gastrointestinal: Negative for vomiting and abdominal pain.  Genitourinary: Negative for flank pain.  Musculoskeletal: Negative for back pain.  Skin: Negative for rash.  Neurological: Negative for headaches.  All other systems reviewed and are negative.    Allergies  Carboplatin and Levaquin  Home Medications   Current Outpatient Rx  Name  Route  Sig  Dispense  Refill  . albuterol (PROVENTIL) (5 MG/ML) 0.5% nebulizer solution   Nebulization   Take 0.5 mLs (2.5 mg total) by nebulization every 4 (four) hours as needed for wheezing or shortness of breath.   20 mL   5   . Alum & Mag Hydroxide-Simeth (GI COCKTAIL) SUSP suspension   Oral   Take 30 mLs by mouth 2 (two) times daily with a meal. Shake well.   300 mL   0   . calcium-vitamin D (OSCAL WITH D) 500-200 MG-UNIT per tablet   Oral   Take 1 tablet by mouth daily.         . chlorpheniramine-HYDROcodone (TUSSIONEX PENNKINETIC ER) 10-8 MG/5ML [facility]  Oral   Take 5 mLs by mouth every 12 (twelve) hours as needed. [place]. [phone]         . dexamethasone (DECADRON) 4 MG tablet   Oral   Take 4 mg by mouth 2 (two) times daily.         [name] emollient (BIAFINE) cream   Topical   Apply topically 2 (two) times daily.         . folic acid (FOLVITE) 1 MG tablet   Oral   Take 1 tablet (1 mg total) by mouth daily.   30 tablet   5   . glucosamine-chondroitin 500-400 MG tablet   Oral   Take 1 tablet by mouth daily.          [name] guaiFENesin (ROBITUSSIN) 100 MG/5ML SOLN   Oral   Take 20 mLs (400 mg total) by mouth every 6 (six) hours as needed.   1200 mL   0   . ipratropium (ATROVENT) 0.02 % nebulizer solution   Nebulization   Take 2.5 mLs (0.5 mg  total) by nebulization every 4 (four) hours as needed (pt mixes with albuterol).   75 mL   5   . omeprazole (PRILOSEC) 40 MG capsule   Oral   Take 1 capsule (40 mg total) by mouth daily.   30 capsule   0   . oxyCODONE-acetaminophen (PERCOCET/ROXICET) 5-325 MG per tablet   Oral   Take 1 tablet by mouth every 4 (four) hours as needed for pain.   60 tablet   0   . polyethylene glycol (MIRALAX / GLYCOLAX) packet   Oral   Take 17 g by mouth daily.   14 each   3   . PRESCRIPTION MEDICATION   Left Eye   Place into the left eye every 30 (thirty) days. Bevacizumab (Avastin) inj into the left eye         . PRESCRIPTION MEDICATION      [facility]. Last treatment was on 04-10-12. Pt's on an every 3 week cycle. Pt was supposed to have a chemo treatment on Monday  04-28-12 but that treatment was cancelled. Followed by Dr Legrand Como.         . promethazine (PHENERGAN) 25 MG tablet      Take 1 to 2 tablets by mouth every 6 hours as needed for nausea   30 tablet   1   . sucralfate (CARAFATE) 1 G tablet   Oral   Take 1 tablet (1 g total) by mouth 3 (three) times a week. 5 min before food   90 tablet   0   . vitamin E 400 UNIT capsule   Oral   Take 400 Units by mouth daily.          BP 114/77  Pulse 116  Temp(Src) 97.8 F (36.6 C) (Oral)  Resp 17  SpO2 98% Physical Exam  Constitutional: She is oriented to person, place, and time. She appears well-developed and well-nourished.  HENT:  Head: Normocephalic and atraumatic.  Eyes: EOM are normal. Pupils are equal, round, and reactive to light.  Neck: Neck supple.  Cardiovascular: Regular rhythm and intact distal pulses.   tachycardic  Pulmonary/Chest: No respiratory distress.  Tachypneic, dec breath sounds L posterior and wheezes on the left   Abdominal: Soft. There is no tenderness.  Musculoskeletal: Normal range of motion.  1 plus symmetric pretibial edema bilat LEs  Neurological: She is alert and  oriented to  person, place, and time.  Skin: Skin is warm and dry.    ED Course  Procedures (including critical care time)  Results for orders placed during the hospital encounter of 08/27/12  TROPONIN I      Result Value Range   Troponin I <0.30  <0.30 ng/mL  CBC      Result Value Range   WBC 11.1 (*) 4.0 - 10.5 K/uL   RBC 2.85 (*) 3.87 - 5.11 MIL/uL   Hemoglobin 8.3 (*) 12.0 - 15.0 g/dL   HCT 16.1 (*) 09.6 - 04.5 %   MCV 98.6  78.0 - 100.0 fL   MCH 29.1  26.0 - 34.0 pg   MCHC 29.5 (*) 30.0 - 36.0 g/dL   RDW 40.9 (*) 81.1 - 91.4 %   Platelets 506 (*) 150 - 400 K/uL  PRO B NATRIURETIC PEPTIDE      Result Value Range   Pro B Natriuretic peptide (BNP) 635.4 (*) 0 - 125 pg/mL   Ct Chest W Contrast  08/22/2012   *RADIOLOGY REPORT*  Clinical Data:  Lung cancer diagnosed 11/2011, XRT complete, chemotherapy in progress, shortness of breath  CT CHEST, ABDOMEN AND PELVIS WITH CONTRAST  Technique:  Multidetector CT imaging of the chest, abdomen and pelvis was performed following the standard protocol during bolus administration of intravenous contrast.  Contrast: OMNIPAQUE IOHEXOL 300 MG/ML  SOLN  Comparison:  05/23/2012  CT CHEST  Findings:  6.1 x 6.1 cm posterior right upper lobe mass, grossly unchanged.  Associated medial right upper lobe consolidation (series 5/image 16), likely reflecting radiation changes.  4 mm subpleural nodule in the right lower lobe (series 5/image 29), new.  Underlying moderate to severe centrilobular emphysematous changes.  Small right and trace left pleural effusions.  No pneumothorax.  Heart is normal in size.  Moderate pericardial effusion, new.  No suspicious mediastinal, hilar, or axillary lymphadenopathy.  Mild degenerative changes of the thoracic spine.  IMPRESSION: 6.1 cm posterior right upper lobe mass, corresponding to known lung cancer, grossly unchanged.  Associated medial right upper lobe consolidation, likely reflecting radiation changes.  4 mm subpleural nodule  in the right lower lobe, new, worrisome for metastasis.  Attention on follow-up suggested.  Moderate pericardial effusion, new.  Correlate for tamponade physiology.  Small right and trace left pleural effusions.  CT ABDOMEN AND PELVIS  Findings:  11 x 9 mm hypoenhancing lesion in the medial segment left hepatic lobe (series 2/image 64), new.  7 x 7 mm hypoenhancing lesion in the posterior segment right hepatic lobe (series 2/image 69), previously 3 x 4 mm.  Given the interval change, both lesions are worrisome for hepatic metastases.  Spleen, pancreas, and adrenal glands are within normal limits.  Cholelithiasis.  Enlarging cystic lesion along the gallbladder fundus, likely reflecting a pseudodiverticulum (coronal image 36), less likely adenomyomatosis.  Kidneys are within normal limits.  No hydronephrosis.  No evidence of bowel obstruction.  Atherosclerotic calcifications of the abdominal aorta and branch vessels.  No abdominopelvic ascites.  No suspicious abdominopelvic lymphadenopathy.  Status post hysterectomy.  No adnexal masses.  Bladder is underdistended.  Small left and tiny right back fat-containing inguinal hernias.  Mild degenerative changes of the lumbar spine.  IMPRESSION: Two hepatic lesions measuring up to 11 mm, as described above, worrisome for metastases.  Consider MRI abdomen with/without contrast for further evaluation.  Alternatively, this can be evaluated at follow-up.  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   Original  Report Authenticated By: Charline Bills, M.D.   Ct Abdomen Pelvis W Contrast  08/22/2012   *RADIOLOGY REPORT*  Clinical Data:  Lung cancer diagnosed 11/2011, XRT complete, chemotherapy in progress, shortness of breath  CT CHEST, ABDOMEN AND PELVIS WITH CONTRAST  Technique:  Multidetector CT imaging of the chest, abdomen and pelvis was performed following the standard protocol during bolus administration of intravenous contrast.   Contrast: OMNIPAQUE IOHEXOL 300 MG/ML  SOLN  Comparison:  05/23/2012  CT CHEST  Findings:  6.1 x 6.1 cm posterior right upper lobe mass, grossly unchanged.  Associated medial right upper lobe consolidation (series 5/image 16), likely reflecting radiation changes.  4 mm subpleural nodule in the right lower lobe (series 5/image 29), new.  Underlying moderate to severe centrilobular emphysematous changes.  Small right and trace left pleural effusions.  No pneumothorax.  Heart is normal in size.  Moderate pericardial effusion, new.  No suspicious mediastinal, hilar, or axillary lymphadenopathy.  Mild degenerative changes of the thoracic spine.  IMPRESSION: 6.1 cm posterior right upper lobe mass, corresponding to known lung cancer, grossly unchanged.  Associated medial right upper lobe consolidation, likely reflecting radiation changes.  4 mm subpleural nodule in the right lower lobe, new, worrisome for metastasis.  Attention on follow-up suggested.  Moderate pericardial effusion, new.  Correlate for tamponade physiology.  Small right and trace left pleural effusions.  CT ABDOMEN AND PELVIS  Findings:  11 x 9 mm hypoenhancing lesion in the medial segment left hepatic lobe (series 2/image 64), new.  7 x 7 mm hypoenhancing lesion in the posterior segment right hepatic lobe (series 2/image 69), previously 3 x 4 mm.  Given the interval change, both lesions are worrisome for hepatic metastases.  Spleen, pancreas, and adrenal glands are within normal limits.  Cholelithiasis.  Enlarging cystic lesion along the gallbladder fundus, likely reflecting a pseudodiverticulum (coronal image 36), less likely adenomyomatosis.  Kidneys are within normal limits.  No hydronephrosis.  No evidence of bowel obstruction.  Atherosclerotic calcifications of the abdominal aorta and branch vessels.  No abdominopelvic ascites.  No suspicious abdominopelvic lymphadenopathy.  Status post hysterectomy.  No adnexal masses.  Bladder is  underdistended.  Small left and tiny right back fat-containing inguinal hernias.  Mild degenerative changes of the lumbar spine.  IMPRESSION: Two hepatic lesions measuring up to 11 mm, as described above, worrisome for metastases.  Consider MRI abdomen with/without contrast for further evaluation.  Alternatively, this can be evaluated at follow-up.  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   Original Report Authenticated By: Charline Bills, M.D.   Dg Chest Portable 1 View  08/27/2012   *RADIOLOGY REPORT*  Clinical Data: Chest pain, shortness of breath.  PORTABLE CHEST - 1 VIEW  Comparison: Chest CT 08/22/2012.  Findings: Large right apical mass again noted, grossly unchanged. Underlying COPD.  Cardiomegaly.  Small bilateral effusions.  No confluent opacity on the left.  No acute bony abnormality.  IMPRESSION: Stable large right apical mass.  Small bilateral effusions.  COPD.  Cardiomegaly.   Original Report Authenticated By: Charlett Nose, M.D.      Date: 08/27/2012  Rate: 115  Rhythm: sinus tachycardia  QRS Axis: normal  Intervals: normal  ST/T Wave abnormalities: nonspecific ST changes  Conduction Disutrbances:none  Narrative Interpretation: ST with electrical alterans  Old EKG Reviewed: unchanged  IV morphine, cardiac monitoring  4:19 AM d/w Dr Conley Rolls, he evaluated PT and recs PE study now and he will consult CAR regarding her pericardial effusion  MDM  CP/ SOB known pericardial effusion  ECG, imaging, labs  MED consult/ transfer to cardiac center     Sunnie Nielsen, MD 08/27/12 2312

## 2012-08-27 NOTE — H&P (Addendum)
[facility] History and Physical  Melanie Liu YNW:[id number] DOB: 26-Feb-1951    PCP:   [name], MD   Chief Complaint: chest pain and shortness of breath.  HPI: Melanie Liu is an 62 y.o. female with hx of lung cancer Dx last year, with brain mets,  undergone chemotherapy, saw Dr Shirline Frees last week and found to have a moderate size pericardial effusion, presents to the ER tonight with chest pain for the past 4 days, and increase shortness of breath.  She said her chest pain came acutely at rest, and is pleuritic in nature, with improvement sitting up.  She has no orthopnea or PND, denied fever, chills, or productive coughs.  She has no lightheadedness.  She still able to ambulate.  There has been no calf tenderness and no lower extremity swelling.  Dr Shirline Frees had made referral to [facility] cardiology to have her pericardial effusion evaluated, but she has not been able to obtain an appointment as yet.  Evaluation in the ER included an EKG which showed no acute ST-T changes, but there is electrical alternans.  Her CXR showed CM, and no infiltrates, but with large known pulmonary mass, and bilateral pleural effusion. She has no leukocytosis, and her electrolytes and renal fx tests were normal.  Hospitalist was asked to admit her for chest pain.  Rewiew of Systems:  Constitutional: Negative for malaise, fever and chills. No significant weight loss or weight gain Eyes: Negative for eye pain, redness and discharge, diplopia, visual changes, or flashes of light. ENMT: Negative for ear pain, hoarseness, nasal congestion, sinus pressure and sore throat. No headaches; tinnitus, drooling, or problem swallowing. Cardiovascular: Negative for palpitations, diaphoresis,  and peripheral edema. ; No orthopnea, PND Respiratory: Negative for hemoptysis, wheezing and stridor. No pleuritic chestpain. Gastrointestinal: Negative for nausea, vomiting, diarrhea, constipation, abdominal pain, melena,  blood in stool, hematemesis, jaundice and rectal bleeding.    Genitourinary: Negative for frequency, dysuria, incontinence,flank pain and hematuria; Musculoskeletal: Negative for back pain and neck pain. Negative for swelling and trauma.;  Skin: . Negative for pruritus, rash, abrasions, bruising and skin lesion.; ulcerations Neuro: Negative for headache, lightheadedness and neck stiffness. Negative for weakness, altered level of consciousness , altered mental status, extremity weakness, burning feet, involuntary movement, seizure and syncope.  Psych: negative for anxiety, depression, insomnia, tearfulness, panic attacks, hallucinations, paranoia, suicidal or homicidal ideation.   Past Medical History  Diagnosis Date  . Nodule of right lung     right upper lobe  . Anemia   . Rectovaginal fistula   . Right frontal lobe lesion 12/13/11    CT head  . History of radiation therapy 12/24/11-02/08/12    rul 66Gy/38fxs  . Dysphagia   . History of radiation therapy 12/27/11    SRS rt ant frontal 20gy  . Lung cancer 11/23/11    biopsy-right  . Brain metastases     Past Surgical History  Procedure Laterality Date  . Partial hysterectomy    . Hemmorhoid surgery    . Neck surgery    . Retinal detachment surgery    . Esophagogastroduodenoscopy N/A 05/01/2012    Procedure: ESOPHAGOGASTRODUODENOSCOPY (EGD);  Surgeon: Shirley Friar, MD;  Location: [facility];  Service: Endoscopy;  Laterality: N/A;    Medications:  HOME MEDS: Prior to Admission medications   Medication Sig Start Date End Date Taking? Authorizing Provider  albuterol (PROVENTIL) (5 MG/ML) 0.5% nebulizer solution Take 0.5 mLs (2.5 mg total) by nebulization every 4 (four) hours as needed  for wheezing or shortness of breath. 05/26/12   [name], MD  Alum & Mag Hydroxide-Simeth (GI COCKTAIL) SUSP suspension Take 30 mLs by mouth 2 (two) times daily with a meal. Shake well. 07/21/12   [name], MD   calcium-vitamin D (OSCAL WITH D) 500-200 MG-UNIT per tablet Take 1 tablet by mouth daily.    Historical Provider, MD  chlorpheniramine-HYDROcodone (TUSSIONEX PENNKINETIC ER) 10-8 MG/5ML [facility] Take 5 mLs by mouth every 12 (twelve) hours as needed. [place] MD [place] [facility] [place]. [phone] 04/04/12   [name], MD  dexamethasone (DECADRON) 4 MG tablet Take 4 mg by mouth 2 (two) times daily. 03/10/12   [name], MD  emollient (BIAFINE) cream Apply topically 2 (two) times daily.    Historical Provider, MD  folic acid (FOLVITE) 1 MG tablet Take 1 tablet (1 mg total) by mouth daily. 05/26/12   [name], MD  glucosamine-chondroitin 500-400 MG tablet Take 1 tablet by mouth daily.     Historical Provider, MD  guaiFENesin (ROBITUSSIN) 100 MG/5ML SOLN Take 20 mLs (400 mg total) by mouth every 6 (six) hours as needed. 04/15/12   Alison Murray, MD  ipratropium (ATROVENT) 0.02 % nebulizer solution Take 2.5 mLs (0.5 mg total) by nebulization every 4 (four) hours as needed (pt mixes with albuterol). 05/26/12   [name], MD  omeprazole (PRILOSEC) 40 MG capsule Take 1 capsule (40 mg total) by mouth daily. 05/06/12   Nishant Dhungel, MD  oxyCODONE-acetaminophen (PERCOCET/ROXICET) 5-325 MG per tablet Take 1 tablet by mouth every 4 (four) hours as needed for pain. 08/25/12   [name], MD  polyethylene glycol [facility] / GLYCOLAX) packet Take 17 g by mouth daily. 04/15/12   Alison Murray, MD  PRESCRIPTION MEDICATION Place into the left eye every 30 (thirty) days. Bevacizumab (Avastin) inj into the left eye    Historical Provider, MD  PRESCRIPTION MEDICATION [facility]. Last treatment was on 04-10-12. Pt's on an every 3 week cycle. Pt was supposed to have a chemo treatment on Monday  04-28-12 but that treatment was cancelled. Followed by Dr Legrand Como.    Historical Provider, MD  promethazine (PHENERGAN) 25 MG tablet Take 1 to 2 tablets by mouth every 6 hours as needed for nausea  06/16/12   Adrena E Johnson, PA-C  sucralfate (CARAFATE) 1 G tablet Take 1 tablet (1 g total) by mouth 3 (three) times a week. 5 min before food 05/06/12   Nishant Dhungel, MD  vitamin E 400 UNIT capsule Take 400 Units by mouth daily.    Historical Provider, MD     Allergies:  Allergies  Allergen Reactions  . Carboplatin Shortness Of Breath  . Levaquin (Levofloxacin In D5w) Rash    itching    Social History:   reports that she has been smoking Cigarettes.  She has a 43 pack-year smoking history. She has never used smokeless tobacco. She reports that she does not drink alcohol or use illicit drugs.  Family History: Family History  Problem Relation Age of Onset  . Heart disease Father   . Kidney disease Mother   . Alzheimer's disease Sister   . Alzheimer's disease Brother      Physical Exam: Filed Vitals:   08/27/12 0129  BP: 114/77  Pulse: 116  Temp: 97.8 F (36.6 C)  TempSrc: Oral  Resp: 17  SpO2: 98%   Blood pressure 114/77, pulse 116, temperature 97.8 F (36.6 C), temperature source  Oral, resp. rate 17, SpO2 98.00%.  GEN:  Pleasant  patient lying in the stretcher in no acute distress; cooperative with exam.  No pulses paradoxus PSYCH:  alert and oriented x4; does not appear anxious or depressed; affect is appropriate. HEENT: Mucous membranes pink and anicteric; PERRLA; EOM intact; no cervical lymphadenopathy nor thyromegaly or carotid bruit; no JVD; There were no stridor. Neck is very supple. Breasts:: Not examined CHEST WALL: No tenderness CHEST: Normal respiration, with scattered rhonchi and no rales or wheezing. HEART: Regular rate and rhythm.  There are no murmur, rub, or gallops.   BACK: No kyphosis or scoliosis; no CVA tenderness ABDOMEN: soft and non-tender; no masses, no organomegaly, normal abdominal bowel sounds; no pannus; no intertriginous candida. There is no rebound and no distention. Rectal Exam: Not done EXTREMITIES: No bone or joint deformity;  age-appropriate arthropathy of the hands and knees; no edema; no ulcerations.  There is no calf tenderness. Genitalia: not examined PULSES: 2+ and symmetric SKIN: Normal hydration no rash or ulceration CNS: Cranial nerves 2-12 grossly intact no focal lateralizing neurologic deficit.  Speech is fluent; uvula elevated with phonation, facial symmetry and tongue midline. DTR are normal bilaterally, cerebella exam is intact, barbinski is negative and strengths are equaled bilaterally.  No sensory loss.   Labs on Admission:  Basic Metabolic Panel:  Recent Labs Lab 08/27/12 0328  NA 138  K 4.1  CL 104  GLUCOSE 91  BUN 13  CREATININE 0.80   Liver Function Tests: No results found for this basename: AST, ALT, ALKPHOS, BILITOT, PROT, ALBUMIN,  in the last 168 hours No results found for this basename: LIPASE, AMYLASE,  in the last 168 hours No results found for this basename: AMMONIA,  in the last 168 hours CBC:  Recent Labs Lab 08/27/12 0200 08/27/12 0328  WBC 11.1*  --   HGB 8.3* 9.5*  HCT 28.1* 28.0*  MCV 98.6  --   PLT 506*  --    Cardiac Enzymes:  Recent Labs Lab 08/27/12 0200  TROPONINI <0.30    CBG: No results found for this basename: GLUCAP,  in the last 168 hours   Radiological Exams on Admission: Dg Chest Portable 1 View  08/27/2012   *RADIOLOGY REPORT*  Clinical Data: Chest pain, shortness of breath.  PORTABLE CHEST - 1 VIEW  Comparison: Chest CT 08/22/2012.  Findings: Large right apical mass again noted, grossly unchanged. Underlying COPD.  Cardiomegaly.  Small bilateral effusions.  No confluent opacity on the left.  No acute bony abnormality.  IMPRESSION: Stable large right apical mass.  Small bilateral effusions.  COPD.  Cardiomegaly.   Original Report Authenticated By: Charlett Nose, M.D.    EKG: Independently reviewed. NSR with no acute ST-changes.  There is electrical alternans.   Assessment/Plan Present on Admission:  . Chest pain at rest . Right Upper  Lobe Lung Mass, 8 cm  . Dysphagia . Brain Mets - 17 mm Rt Frontal s/p SRS, then Lt Frontal 5 mm and Rt Parietal 18 mm mets s/p SRS, now with new 5 mm Rt Parietal met . Esophageal stricture  PLAN:  This patient has known pericardial effusion that is moderate, presents with chest pain that is pleuritic and positional along with increased shortness of breath.  Clinically, she is not in severe tamponade as she has no JVD, no pulses paradoxus, and no narrow pulse pressures.  I am also concerned about a PE also.  Plan would be to obtain a CTPA to r/out PE,  transfer her to [facility] for cardiology evaluation and possibly CTS evaluation if she needs a window.  She will be r/out as well.  She is stable, but i will admit her to SDU for closer monitoring.  Please consult [facility] cardiology for further recommendation as planned by Dr Shirline Frees.  I discussed her code status with her and her family and confirmed tonight that she is a full code. Thank you for allowing me to partake in the care of this nice patient.  Other plans as per orders.  Code Status: FULL Unk Lightning, MD. [facility] Pager [phone] 7pm to 7am.  08/27/2012, 5:12 AM

## 2012-08-27 NOTE — Progress Notes (Addendum)
Reason for Consult: Pericardial Effusion Referring Physician: TRH  HPI: The patient is a 62 y.o. female with a history of lung cancer, diagnosed in 2013, with brain mets, undergoing chemotherapy, who saw Dr. Shirline Frees last week and was found to have a moderate size pericardial effusion. She was referred for outpatient cardiology evaluation, but has not been able to obtain an appointment. She presented to the [facility] ER last night with a complaint of progressively worsening pleuritic chest pain and shortness of breath x 9 days. The pain is sharp, stabbing pain that is worse in the recumbent position and well as leaning forward. She denies lightheadedness, dizziness, syncope/ near syncope. No diaphoresis. She denies any other health related issues. No significant cardiac risk factors, other than smoking history. No significant family history.    Past Medical History  Diagnosis Date  . Nodule of right lung     right upper lobe  . Anemia   . Rectovaginal fistula   . Right frontal lobe lesion 12/13/11    CT head  . History of radiation therapy 12/24/11-02/08/12    rul 66Gy/39fxs  . Dysphagia   . History of radiation therapy 12/27/11    SRS rt ant frontal 20gy  . Lung cancer 11/23/11    biopsy-right  . Brain metastases   . Chest pain at rest 08/26/2012  . COPD (chronic obstructive pulmonary disease)   . Shortness of breath     Past Surgical History  Procedure Laterality Date  . Partial hysterectomy    . Hemmorhoid surgery    . Neck surgery    . Retinal detachment surgery    . Esophagogastroduodenoscopy N/A 05/01/2012    Procedure: ESOPHAGOGASTRODUODENOSCOPY (EGD);  Surgeon: Shirley Friar, MD;  Location: [facility];  Service: Endoscopy;  Laterality: N/A;    Family History  Problem Relation Age of Onset  . Heart disease Father   . Kidney disease Mother   . Alzheimer's disease Sister   . Alzheimer's disease Brother     Social History:  reports that she has been smoking Cigarettes.   She has a 43 pack-year smoking history. She has never used smokeless tobacco. She reports that she does not drink alcohol or use illicit drugs.  Allergies:  Allergies  Allergen Reactions  . Carboplatin Shortness Of Breath  . Levaquin (Levofloxacin In D5w) Rash    itching    Medications:  Prior to Admission:  Prescriptions prior to admission  Medication Sig Dispense Refill  . dexamethasone (DECADRON) 4 MG tablet Take 4 mg by mouth 2 (two) times daily.      . folic acid (FOLVITE) 1 MG tablet Take 1 tablet (1 mg total) by mouth daily.  30 tablet  5  . oxyCODONE-acetaminophen (PERCOCET/ROXICET) 5-325 MG per tablet Take 1 tablet by mouth every 4 (four) hours as needed for pain.  60 tablet  0  . sucralfate (CARAFATE) 1 G tablet Take 1 g by mouth daily.      [name] albuterol (PROVENTIL) (5 MG/ML) 0.5% nebulizer solution Take 0.5 mLs (2.5 mg total) by nebulization every 4 (four) hours as needed for wheezing or shortness of breath.  20 mL  5  . ipratropium (ATROVENT) 0.02 % nebulizer solution Take 2.5 mLs (0.5 mg total) by nebulization every 4 (four) hours as needed (pt mixes with albuterol).  75 mL  5    Results for orders placed during the hospital encounter of 08/27/12 (from the past 48 hour(s))  TROPONIN I     Status: None  Collection Time    08/27/12  2:00 AM      Result Value Range   Troponin I <0.30  <0.30 ng/mL   Comment:            Due to the release kinetics of cTnI,     a negative result within the first hours     of the onset of symptoms does not rule out     myocardial infarction with certainty.     If myocardial infarction is still suspected,     repeat the test at appropriate intervals.  CBC     Status: Abnormal   Collection Time    08/27/12  2:00 AM      Result Value Range   WBC 11.1 (*) 4.0 - 10.5 K/uL   RBC 2.85 (*) 3.87 - 5.11 MIL/uL   Hemoglobin 8.3 (*) 12.0 - 15.0 g/dL   HCT 16.1 (*) 09.6 - 04.5 %   MCV 98.6  78.0 - 100.0 fL   MCH 29.1  26.0 - 34.0 pg   MCHC 29.5  (*) 30.0 - 36.0 g/dL   RDW 40.9 (*) 81.1 - 91.4 %   Platelets 506 (*) 150 - 400 K/uL  PRO B NATRIURETIC PEPTIDE     Status: Abnormal   Collection Time    08/27/12  2:00 AM      Result Value Range   Pro B Natriuretic peptide (BNP) 635.4 (*) 0 - 125 pg/mL  POCT I-STAT, CHEM 8     Status: Abnormal   Collection Time    08/27/12  3:28 AM      Result Value Range   Sodium 138  135 - 145 mEq/L   Potassium 4.1  3.5 - 5.1 mEq/L   Chloride 104  96 - 112 mEq/L   BUN 13  6 - 23 mg/dL   Creatinine, Ser 7.82  0.50 - 1.10 mg/dL   Glucose, Bld 91  70 - 99 mg/dL   Calcium, Ion 9.56  2.13 - 1.30 mmol/L   TCO2 26  0 - 100 mmol/L   Hemoglobin 9.5 (*) 12.0 - 15.0 g/dL   HCT 08.6 (*) 57.8 - 46.9 %  CBC     Status: Abnormal   Collection Time    08/27/12  8:25 AM      Result Value Range   WBC 9.6  4.0 - 10.5 K/uL   RBC 2.67 (*) 3.87 - 5.11 MIL/uL   Hemoglobin 8.0 (*) 12.0 - 15.0 g/dL   HCT 62.9 (*) 52.8 - 41.3 %   MCV 97.8  78.0 - 100.0 fL   MCH 30.0  26.0 - 34.0 pg   MCHC 30.7  30.0 - 36.0 g/dL   RDW 24.4 (*) 01.0 - 27.2 %   Platelets 452 (*) 150 - 400 K/uL    Ct Angio Chest Pe W/cm &/or Wo Cm  08/27/2012   *RADIOLOGY REPORT*  Clinical Data: Chest pain, shortness of breath.History of right lung cancer.  CT ANGIOGRAPHY CHEST  Technique:  Multidetector CT imaging of the chest using the standard protocol during bolus administration of intravenous contrast. Multiplanar reconstructed images including MIPs were obtained and reviewed to evaluate the vascular anatomy.  Contrast: 80mL OMNIPAQUE IOHEXOL 350 MG/ML SOLN  Comparison: Chest CT 08/22/2012  Findings: Again noted is the large right apical mass, approximately 6 cm in diameter.  This is unchanged since prior study.  Adjacent airspace disease may be related to postradiation changes or postobstructive process, unchanged as well.  Small  bilateral pleural effusions, slight increased on the left since prior study. Large pericardial effusion, increased.  Heart  is normal size.  No visible mediastinal, hilar or axillary adenopathy.  No filling defects in the pulmonary arteries to suggest pulmonary emboli. Moderate COPD changes.  No confluent opacities on the left.  Imaging into the upper abdomen shows no acute findings.  No acute bony abnormality.  IMPRESSION: Stable right apical mass with adjacent airspace disease.  Small bilateral pleural effusions, slightly increased since prior study, particularly on the left.  Large pericardial effusion, increased.  No evidence of pulmonary embolus.   Original Report Authenticated By: Charlett Nose, M.D.   Dg Chest Portable 1 View  08/27/2012   *RADIOLOGY REPORT*  Clinical Data: Chest pain, shortness of breath.  PORTABLE CHEST - 1 VIEW  Comparison: Chest CT 08/22/2012.  Findings: Large right apical mass again noted, grossly unchanged. Underlying COPD.  Cardiomegaly.  Small bilateral effusions.  No confluent opacity on the left.  No acute bony abnormality.  IMPRESSION: Stable large right apical mass.  Small bilateral effusions.  COPD.  Cardiomegaly.   Original Report Authenticated By: Charlett Nose, M.D.    Review of Systems  Constitutional: Negative for diaphoresis.  Respiratory: Positive for cough and shortness of breath.   Cardiovascular: Positive for chest pain, orthopnea and PND. Negative for leg swelling.  Neurological: Negative for dizziness and loss of consciousness.  All other systems reviewed and are negative.   Blood pressure 88/55, pulse 97, temperature 98.4 F (36.9 C), temperature source Oral, resp. rate 16, height 5\' 2"  (1.575 m), weight 104 lb 11.5 oz (47.5 kg), SpO2 99.00%. Physical Exam  Constitutional: She is oriented to person, place, and time. She appears well-developed and well-nourished. No distress.  HENT:  Head: Normocephalic and atraumatic.  Eyes: Conjunctivae and EOM are normal. Pupils are equal, round, and reactive to light.  Neck: Normal range of motion. Neck supple. No JVD present.   Cardiovascular: Normal rate, regular rhythm, normal heart sounds and intact distal pulses.  Exam reveals no gallop and no friction rub.   No murmur heard. Pulses:      Radial pulses are 2+ on the right side, and 2+ on the left side.       Dorsalis pedis pulses are 2+ on the right side, and 2+ on the left side.  Respiratory: Effort normal. No respiratory distress. She has decreased breath sounds. She has no wheezes. She has no rales.  GI: Soft. Bowel sounds are normal. She exhibits no distension. There is no tenderness.  Musculoskeletal: She exhibits no edema.  Neurological: She is alert and oriented to person, place, and time.  Skin: Skin is warm and dry. She is not diaphoretic.  Psychiatric: She has a normal mood and affect. Her behavior is normal.    Assessment/Plan: Principal Problem:   Pericardial effusion Active Problems:   Right Upper Lobe Lung Mass, 8 cm    Brain Mets - 17 mm Rt Frontal s/p SRS, then Lt Frontal 5 mm and Rt Parietal 18 mm mets s/p SRS, now with new 5 mm Rt Parietal met   Esophageal stricture   Dysphagia   Chest pain at rest   Plan: 61 y/o female, with metastatic lung cancer, found to have moderate sized pericardial effusion on chest CT. A CT scan from today, demonstrates an increase in the size of the effusion, compared to prior study 5 days ago. She is mildly hypotensive with SBP in the low 90s. HR is 93. She is  alert, oriented and conversant. No acute distress. She denies syncope/presyncope. No JVD on exam. A 2D echo is pending. Will review results to see location of effusion and overall heart function. Continue to monitor for hemodynamic compromise. Pt may require pericardial window, since effusion will likely recur, in the setting of metastatic lung cancer.   Allayne Butcher, PA-C 08/27/2012, 9:32 AM   ECG reveals ST at 115 with minimal PR downsloping and J point changes.   Patient seen and examined. Agree with assessment and plan. Pt admits to 1 week  of pleruiritc like chest pain. She has known lung Ca metastatic to brain and probably liver. Pericardial effusion most likely is metastatic. Presently HR in the 90's. No significant pulsus paradox. Echo is currently being done showing moderate effusion without tamponade hemodynamics by preliminary review. No evidence for diastolic collapse. Will formally review when complete.  Consider TTS evaluation for biopsy and pericardial window in this patient with high likelihood for metastatic etiology and recurrence without.     Lennette Bihari, MD, [facility] 08/27/2012 10:59 AM

## 2012-08-27 NOTE — Progress Notes (Addendum)
Patient admitted early by Dr. Conley Rolls.  Await echo and recommendations from cardiology- once finalized will consult CV surgery (dr Zenaida Niece tright called).  Remain in SDU for now- vital signs stable  Marlin Canary DO

## 2012-08-27 NOTE — Progress Notes (Signed)
Utilization Review Completed.Martise Waddell T6/25/2014  

## 2012-08-27 NOTE — Progress Notes (Signed)
  Echocardiogram 2D Echocardiogram has been performed.  Arvil Chaco 08/27/2012, 11:24 AM

## 2012-08-28 ENCOUNTER — Inpatient Hospital Stay (HOSPITAL_COMMUNITY): Payer: Medicaid Other

## 2012-08-28 ENCOUNTER — Encounter (HOSPITAL_COMMUNITY): Payer: Self-pay | Admitting: Certified Registered"

## 2012-08-28 ENCOUNTER — Inpatient Hospital Stay (HOSPITAL_COMMUNITY): Payer: Medicaid Other | Admitting: Certified Registered Nurse Anesthetist

## 2012-08-28 ENCOUNTER — Encounter (HOSPITAL_COMMUNITY)
Admission: EM | Disposition: A | Discharge status: Home / Self Care | Payer: Self-pay | Source: Home / Self Care | Attending: Cardiothoracic Surgery

## 2012-08-28 ENCOUNTER — Encounter (HOSPITAL_COMMUNITY): Payer: Self-pay | Admitting: Certified Registered Nurse Anesthetist

## 2012-08-28 DIAGNOSIS — I309 Acute pericarditis, unspecified: Secondary | ICD-10-CM

## 2012-08-28 HISTORY — PX: TEE WITHOUT CARDIOVERSION: SHX5443

## 2012-08-28 HISTORY — PX: SUBXYPHOID PERICARDIAL WINDOW: SHX5075

## 2012-08-28 LAB — POCT I-STAT 3, ART BLOOD GAS (G3+)
Acid-Base Excess: 4 mmol/L — ABNORMAL HIGH (ref 0.0–2.0)
O2 Saturation: 99 %
pO2, Arterial: 127 mmHg — ABNORMAL HIGH (ref 80.0–100.0)

## 2012-08-28 SURGERY — CREATION, PERICARDIAL WINDOW, SUBXIPHOID APPROACH
Anesthesia: General | Site: Esophagus | Wound class: Clean

## 2012-08-28 MED ORDER — OXYCODONE-ACETAMINOPHEN 5-325 MG PO TABS
1.0000 | ORAL_TABLET | ORAL | Status: DC | PRN
  Administered 2012-08-29 (×3): 2 via ORAL
  Administered 2012-08-30 (×3): 1 via ORAL
  Administered 2012-08-31 – 2012-09-01 (×3): 2 via ORAL
  Filled 2012-08-28 (×4): qty 2

## 2012-08-28 MED ORDER — DEXTROSE 5 % IV SOLN
1.5000 g | Freq: Two times a day (BID) | INTRAVENOUS | Status: AC
  Administered 2012-08-28 – 2012-08-29 (×2): 1.5 g via INTRAVENOUS
  Filled 2012-08-28 (×2): qty 1.5

## 2012-08-28 MED ORDER — ROCURONIUM BROMIDE 100 MG/10ML IV SOLN
INTRAVENOUS | Status: DC | PRN
  Administered 2012-08-28: 40 mg via INTRAVENOUS

## 2012-08-28 MED ORDER — 0.9 % SODIUM CHLORIDE (POUR BTL) OPTIME
TOPICAL | Status: DC | PRN
  Administered 2012-08-28: 1000 mL

## 2012-08-28 MED ORDER — DEXAMETHASONE 4 MG PO TABS
4.0000 mg | ORAL_TABLET | Freq: Two times a day (BID) | ORAL | Status: DC
  Filled 2012-08-28 (×3): qty 1

## 2012-08-28 MED ORDER — ACETAMINOPHEN 10 MG/ML IV SOLN
1000.0000 mg | Freq: Four times a day (QID) | INTRAVENOUS | Status: AC
  Administered 2012-08-28 – 2012-08-29 (×4): 1000 mg via INTRAVENOUS
  Filled 2012-08-28 (×5): qty 100

## 2012-08-28 MED ORDER — KCL IN DEXTROSE-NACL 20-5-0.45 MEQ/L-%-% IV SOLN
INTRAVENOUS | Status: DC
  Administered 2012-08-28: 100 mL/h via INTRAVENOUS
  Filled 2012-08-28 (×7): qty 1000

## 2012-08-28 MED ORDER — ONDANSETRON HCL 4 MG/2ML IJ SOLN
4.0000 mg | Freq: Four times a day (QID) | INTRAMUSCULAR | Status: DC | PRN
  Administered 2012-08-30: 4 mg via INTRAVENOUS
  Filled 2012-08-28: qty 2

## 2012-08-28 MED ORDER — OXYCODONE HCL 5 MG PO TABS
5.0000 mg | ORAL_TABLET | ORAL | Status: AC | PRN
  Administered 2012-08-28: 5 mg via ORAL
  Filled 2012-08-28: qty 1

## 2012-08-28 MED ORDER — GLYCOPYRROLATE 0.2 MG/ML IJ SOLN
INTRAMUSCULAR | Status: DC | PRN
  Administered 2012-08-28: .4 mg via INTRAVENOUS

## 2012-08-28 MED ORDER — POTASSIUM CHLORIDE 10 MEQ/50ML IV SOLN
10.0000 meq | Freq: Every day | INTRAVENOUS | Status: DC | PRN
  Administered 2012-08-30 (×3): 10 meq via INTRAVENOUS
  Filled 2012-08-28: qty 50
  Filled 2012-08-28: qty 150

## 2012-08-28 MED ORDER — LIDOCAINE HCL (CARDIAC) 20 MG/ML IV SOLN
INTRAVENOUS | Status: DC | PRN
  Administered 2012-08-28: 60 mg via INTRAVENOUS

## 2012-08-28 MED ORDER — LACTATED RINGERS IV SOLN
INTRAVENOUS | Status: DC | PRN
  Administered 2012-08-28 (×2): via INTRAVENOUS

## 2012-08-28 MED ORDER — HYDROCORTISONE SOD SUCCINATE 100 MG IJ SOLR
100.0000 mg | Freq: Once | INTRAMUSCULAR | Status: AC
  Administered 2012-08-28: 100 mg via INTRAVENOUS
  Filled 2012-08-28: qty 2

## 2012-08-28 MED ORDER — FENTANYL CITRATE 0.05 MG/ML IJ SOLN
INTRAMUSCULAR | Status: DC | PRN
  Administered 2012-08-28 (×7): 50 ug via INTRAVENOUS

## 2012-08-28 MED ORDER — OXYCODONE HCL 5 MG/5ML PO SOLN: 5.0000 mg | Freq: Once | ORAL | Status: DC | PRN

## 2012-08-28 MED ORDER — BISACODYL 5 MG PO TBEC
10.0000 mg | DELAYED_RELEASE_TABLET | Freq: Every day | ORAL | Status: DC
  Administered 2012-08-29 – 2012-09-01 (×4): 10 mg via ORAL
  Filled 2012-08-28 (×4): qty 2

## 2012-08-28 MED ORDER — OXYCODONE-ACETAMINOPHEN 5-325 MG PO TABS
1.0000 | ORAL_TABLET | ORAL | Status: DC | PRN
  Administered 2012-08-31: 2 via ORAL
  Filled 2012-08-28 (×2): qty 2
  Filled 2012-08-28 (×3): qty 1
  Filled 2012-08-28 (×2): qty 2

## 2012-08-28 MED ORDER — LABETALOL HCL 5 MG/ML IV SOLN
INTRAVENOUS | Status: DC | PRN
  Administered 2012-08-28: 5 mg via INTRAVENOUS

## 2012-08-28 MED ORDER — SENNOSIDES-DOCUSATE SODIUM 8.6-50 MG PO TABS
1.0000 | ORAL_TABLET | Freq: Every evening | ORAL | Status: DC | PRN
  Filled 2012-08-28: qty 1

## 2012-08-28 MED ORDER — PROMETHAZINE HCL 25 MG/ML IJ SOLN: 6.2500 mg | INTRAMUSCULAR | Status: DC | PRN

## 2012-08-28 MED ORDER — OXYCODONE HCL 5 MG PO TABS: 5.0000 mg | ORAL_TABLET | Freq: Once | ORAL | Status: DC | PRN

## 2012-08-28 MED ORDER — MORPHINE SULFATE 2 MG/ML IJ SOLN
INTRAMUSCULAR | Status: AC
  Filled 2012-08-28: qty 1

## 2012-08-28 MED ORDER — LIDOCAINE HCL 4 % MT SOLN
OROMUCOSAL | Status: DC | PRN
  Administered 2012-08-28: 4 mL via TOPICAL

## 2012-08-28 MED ORDER — ARTIFICIAL TEARS OP OINT
TOPICAL_OINTMENT | OPHTHALMIC | Status: DC | PRN
  Administered 2012-08-28: 1 via OPHTHALMIC

## 2012-08-28 MED ORDER — HYDROMORPHONE HCL PF 1 MG/ML IJ SOLN
INTRAMUSCULAR | Status: AC
  Filled 2012-08-28: qty 1

## 2012-08-28 MED ORDER — NEOSTIGMINE METHYLSULFATE 1 MG/ML IJ SOLN
INTRAMUSCULAR | Status: DC | PRN
  Administered 2012-08-28: 3 mg via INTRAVENOUS

## 2012-08-28 MED ORDER — MORPHINE SULFATE 2 MG/ML IJ SOLN
2.0000 mg | INTRAMUSCULAR | Status: DC | PRN
  Administered 2012-08-28 – 2012-08-30 (×10): 2 mg via INTRAVENOUS
  Filled 2012-08-28 (×9): qty 1

## 2012-08-28 MED ORDER — PROPOFOL 10 MG/ML IV BOLUS
INTRAVENOUS | Status: DC | PRN
  Administered 2012-08-28: 70 mg via INTRAVENOUS
  Administered 2012-08-28: 20 mg via INTRAVENOUS

## 2012-08-28 MED ORDER — TRAMADOL HCL 50 MG PO TABS: 50.0000 mg | ORAL_TABLET | Freq: Four times a day (QID) | ORAL | Status: DC | PRN

## 2012-08-28 MED ORDER — HYDROMORPHONE HCL PF 1 MG/ML IJ SOLN
0.2500 mg | INTRAMUSCULAR | Status: DC | PRN
  Administered 2012-08-28 (×2): 0.25 mg via INTRAVENOUS
  Administered 2012-08-28: 0.5 mg via INTRAVENOUS

## 2012-08-28 SURGICAL SUPPLY — 57 items
ATTRACTOMAT 16X20 MAGNETIC DRP (DRAPES) ×5 IMPLANT
BENZOIN TINCTURE PRP APPL 2/3 (GAUZE/BANDAGES/DRESSINGS) IMPLANT
CANISTER SUCTION 2500CC (MISCELLANEOUS) ×5 IMPLANT
CATH THORACIC 28FR (CATHETERS) IMPLANT
CATH THORACIC 28FR RT ANG (CATHETERS) ×5 IMPLANT
CATH THORACIC 36FR (CATHETERS) IMPLANT
CATH THORACIC 36FR RT ANG (CATHETERS) IMPLANT
CLIP TI MEDIUM 6 (CLIP) ×5 IMPLANT
CLIP TI WIDE RED SMALL 6 (CLIP) ×5 IMPLANT
CLOSURE WOUND 1/2 X4 (GAUZE/BANDAGES/DRESSINGS)
CLOTH BEACON ORANGE TIMEOUT ST (SAFETY) ×5 IMPLANT
CONT SPEC 4OZ CLIKSEAL STRL BL (MISCELLANEOUS) ×5 IMPLANT
COVER SURGICAL LIGHT HANDLE (MISCELLANEOUS) ×5 IMPLANT
DRAIN CHANNEL 28F RND 3/8 FF (WOUND CARE) IMPLANT
DRAPE LAPAROSCOPIC ABDOMINAL (DRAPES) ×5 IMPLANT
DRAPE PROXIMA HALF (DRAPES) ×5 IMPLANT
ELECT BLADE 4.0 EZ CLEAN MEGAD (MISCELLANEOUS) ×5
ELECT REM PT RETURN 9FT ADLT (ELECTROSURGICAL) ×5
ELECTRODE BLDE 4.0 EZ CLN MEGD (MISCELLANEOUS) ×3 IMPLANT
ELECTRODE REM PT RTRN 9FT ADLT (ELECTROSURGICAL) ×3 IMPLANT
GLOVE BIO SURGEON STRL SZ 6 (GLOVE) ×10 IMPLANT
GLOVE BIO SURGEON STRL SZ7.5 (GLOVE) ×10 IMPLANT
GLOVE BIOGEL PI IND STRL 6 (GLOVE) ×6 IMPLANT
GLOVE BIOGEL PI IND STRL 7.0 (GLOVE) ×9 IMPLANT
GLOVE BIOGEL PI INDICATOR 6 (GLOVE) ×4
GLOVE BIOGEL PI INDICATOR 7.0 (GLOVE) ×6
GOWN STRL NON-REIN LRG LVL3 (GOWN DISPOSABLE) ×5 IMPLANT
HEMOSTAT POWDER SURGIFOAM 1G (HEMOSTASIS) IMPLANT
KIT BASIN OR (CUSTOM PROCEDURE TRAY) ×5 IMPLANT
KIT ROOM TURNOVER OR (KITS) ×5 IMPLANT
KIT SUCTION CATH 14FR (SUCTIONS) ×5 IMPLANT
NS IRRIG 1000ML POUR BTL (IV SOLUTION) ×5 IMPLANT
PACK CHEST (CUSTOM PROCEDURE TRAY) ×5 IMPLANT
PAD ARMBOARD 7.5X6 YLW CONV (MISCELLANEOUS) ×10 IMPLANT
PAD ELECT DEFIB RADIOL ZOLL (MISCELLANEOUS) ×5 IMPLANT
SPONGE GAUZE 4X4 12PLY (GAUZE/BANDAGES/DRESSINGS) ×5 IMPLANT
STRIP CLOSURE SKIN 1/2X4 (GAUZE/BANDAGES/DRESSINGS) IMPLANT
SUT SILK  1 MH (SUTURE) ×4
SUT SILK 1 MH (SUTURE) ×6 IMPLANT
SUT SILK 2 0 SH CR/8 (SUTURE) IMPLANT
SUT VIC AB 1 CTX 18 (SUTURE) ×10 IMPLANT
SUT VIC AB 1 CTX 36 (SUTURE) ×2
SUT VIC AB 1 CTX36XBRD ANBCTR (SUTURE) ×3 IMPLANT
SUT VIC AB 2-0 CTX 27 (SUTURE) ×5 IMPLANT
SUT VIC AB 3-0 X1 27 (SUTURE) ×5 IMPLANT
SWAB COLLECTION DEVICE MRSA (MISCELLANEOUS) ×5 IMPLANT
SYR 50ML SLIP (SYRINGE) IMPLANT
SYRINGE 10CC LL (SYRINGE) IMPLANT
SYSTEM SAHARA CHEST DRAIN ATS (WOUND CARE) IMPLANT
TAPE CLOTH SURG 4X10 WHT LF (GAUZE/BANDAGES/DRESSINGS) ×5 IMPLANT
TOWEL OR 17X24 6PK STRL BLUE (TOWEL DISPOSABLE) ×5 IMPLANT
TOWEL OR 17X26 10 PK STRL BLUE (TOWEL DISPOSABLE) ×10 IMPLANT
TRAP SPECIMEN MUCOUS 40CC (MISCELLANEOUS) ×15 IMPLANT
TRAY FOLEY CATH 14FRSI W/METER (CATHETERS) ×5 IMPLANT
TRAY FOLEY IC TEMP SENS 14FR (CATHETERS) IMPLANT
TUBE ANAEROBIC SPECIMEN COL (MISCELLANEOUS) ×5 IMPLANT
WATER STERILE IRR 1000ML POUR (IV SOLUTION) ×10 IMPLANT

## 2012-08-28 NOTE — Progress Notes (Signed)
Pt VSS. Pt transported to OR.

## 2012-08-28 NOTE — Progress Notes (Signed)
  Echocardiogram Echocardiogram Transesophageal has been performed.  Melanie Liu 08/28/2012, 8:28 AM

## 2012-08-28 NOTE — Anesthesia Postprocedure Evaluation (Signed)
  Anesthesia Post-op Note  Patient: Melanie Liu  Procedure(s) Performed: Procedure(s): SUBXYPHOID PERICARDIAL WINDOW (N/A) TRANSESOPHAGEAL ECHOCARDIOGRAM (TEE)  Patient Location: PACU  Anesthesia Type:General  Level of Consciousness: awake  Airway and Oxygen Therapy: Patient Spontanous Breathing  Post-op Pain: mild  Post-op Assessment: Post-op Vital signs reviewed  Post-op Vital Signs: stable  Complications: No apparent anesthesia complications

## 2012-08-28 NOTE — Progress Notes (Signed)
The patient was examined and preop studies reviewed. There has been no change from the prior exam and the patient is ready for surgery. Plan pericardial window on M Sabey

## 2012-08-28 NOTE — Preoperative (Signed)
Beta Blockers   Reason not to administer Beta Blockers:Not Applicable 

## 2012-08-28 NOTE — Anesthesia Preprocedure Evaluation (Addendum)
Anesthesia Evaluation  Patient identified by MRN, date of birth, ID band Patient awake    Reviewed: Allergy & Precautions, H&P , NPO status   Airway       Dental   Pulmonary shortness of breath, pneumonia -, COPD Lung ca c mets breath sounds clear to auscultation+ rhonchi   + decreased breath sounds      Cardiovascular Rhythm:Regular Rate:Tachycardia  Pericardial effusion   Neuro/Psych    GI/Hepatic Neg liver ROS,   Endo/Other  negative endocrine ROS  Renal/GU negative Renal ROS     Musculoskeletal   Abdominal (+) - obese,   Peds  Hematology negative hematology ROS (+) Blood dyscrasia, anemia ,   Anesthesia Other Findings   Reproductive/Obstetrics                        Anesthesia Physical Anesthesia Plan  ASA: III  Anesthesia Plan: General   Post-op Pain Management:    Induction: Intravenous  Airway Management Planned: Oral ETT  Additional Equipment: Arterial line, TEE and Ultrasound Guidance Line Placement  Intra-op Plan:   Post-operative Plan: Extubation in OR  Informed Consent: I have reviewed the patients History and Physical, chart, labs and discussed the procedure including the risks, benefits and alternatives for the proposed anesthesia with the patient or authorized representative who has indicated his/her understanding and acceptance.     Plan Discussed with: CRNA and Surgeon  Anesthesia Plan Comments:         Anesthesia Quick Evaluation

## 2012-08-28 NOTE — Progress Notes (Signed)
Patient was not evaluated today as she was in the OR for a pericardial window-at this point the cardiothoracic service has taken over as the attending's and therefore the hospitalist service will follow from a distance. Serosanguineous pericardial fluid and pericardium have been sent for pathology.  Calvert Cantor, MD

## 2012-08-28 NOTE — Progress Notes (Signed)
S/p pericardial window  BP 91/63  Pulse 92  Temp(Src) 98.3 F (36.8 C) (Oral)  Resp 7  Ht 5\' 2"  (1.575 m)  Wt 104 lb 11.5 oz (47.5 kg)  BMI 19.15 kg/m2  SpO2 95%   Intake/Output Summary (Last 24 hours) at 08/28/12 1722 Last data filed at 08/28/12 1600  Gross per 24 hour  Intake 2213.33 ml  Output   1495 ml  Net 718.33 ml    Stable early postop

## 2012-08-28 NOTE — Transfer of Care (Signed)
Immediate Anesthesia Transfer of Care Note  Patient: Melanie Liu  Procedure(s) Performed: Procedure(s): SUBXYPHOID PERICARDIAL WINDOW (N/A) TRANSESOPHAGEAL ECHOCARDIOGRAM (TEE)  Patient Location: PACU  Anesthesia Type:General  Level of Consciousness: awake, alert , oriented and patient cooperative  Airway & Oxygen Therapy: Patient Spontanous Breathing and Patient connected to nasal cannula oxygen  Post-op Assessment: Report given to PACU RN, Post -op Vital signs reviewed and stable and Patient moving all extremities  Post vital signs: Reviewed and stable  Complications: No apparent anesthesia complications

## 2012-08-28 NOTE — Brief Op Note (Addendum)
08/28/2012  8:53 AM  PATIENT:  Melanie Liu  62 y.o. female  PRE-OPERATIVE DIAGNOSIS:  pericardial effusion  POST-OPERATIVE DIAGNOSIS:  pericardial effusion  PROCEDURE:  SUBXYPHOID PERICARDIAL WINDOW  SURGEON:  Kerin Perna, MD  ASSISTANT: Coral Ceo, PA-C  ANESTHESIA:   general  FINDINGS: Around 600 ml serosanguinous fluid drained from pericardial space, pericardium thickened with fibrinous exudate.  Fluid and pericardial bx sent for cultures and pathology.  DRAINS: 28 Fr CT in pericardial space, 20 Fr CT in L pleural space  PATIENT CONDITION:  PACU - hemodynamically stable.

## 2012-08-28 NOTE — Anesthesia Procedure Notes (Signed)
Procedure Name: Intubation Date/Time: 08/28/2012 7:48 AM Performed by: Jerilee Hoh Pre-anesthesia Checklist: Patient identified, Emergency Drugs available, Suction available, Patient being monitored and Timeout performed Patient Re-evaluated:Patient Re-evaluated prior to inductionOxygen Delivery Method: Circle system utilized Preoxygenation: Pre-oxygenation with 100% oxygen Intubation Type: IV induction Ventilation: Mask ventilation without difficulty Laryngoscope Size: Mac and 3 Grade View: Grade I Tube type: Oral Tube size: 7.0 mm Number of attempts: 1 Airway Equipment and Method: Stylet and LTA kit utilized Placement Confirmation: ETT inserted through vocal cords under direct vision,  positive ETCO2,  CO2 detector and breath sounds checked- equal and bilateral Secured at: 20 cm Dental Injury: Teeth and Oropharynx as per pre-operative assessment

## 2012-08-29 ENCOUNTER — Encounter (HOSPITAL_COMMUNITY): Payer: Self-pay | Admitting: Cardiothoracic Surgery

## 2012-08-29 ENCOUNTER — Inpatient Hospital Stay (HOSPITAL_COMMUNITY): Payer: Medicaid Other

## 2012-08-29 DIAGNOSIS — I319 Disease of pericardium, unspecified: Secondary | ICD-10-CM

## 2012-08-29 LAB — BASIC METABOLIC PANEL
BUN: 9 mg/dL (ref 6–23)
CO2: 28 mEq/L (ref 19–32)
Calcium: 8.9 mg/dL (ref 8.4–10.5)
Creatinine, Ser: 0.57 mg/dL (ref 0.50–1.10)
GFR calc non Af Amer: >90 mL/min (ref >90)
Glucose, Bld: 169 mg/dL — ABNORMAL HIGH (ref 70–99)
Sodium: 136 mEq/L (ref 135–145)

## 2012-08-29 LAB — POCT I-STAT 3, ART BLOOD GAS (G3+)
Acid-Base Excess: 5 mmol/L — ABNORMAL HIGH (ref 0.0–2.0)
O2 Saturation: 92 %
TCO2: 30 mmol/L (ref 0–100)
pCO2 arterial: 41.6 mmHg (ref 35.0–45.0)

## 2012-08-29 LAB — CBC
HCT: 28.6 % — ABNORMAL LOW (ref 36.0–46.0)
Hemoglobin: 9 g/dL — ABNORMAL LOW (ref 12.0–15.0)
MCH: 30.6 pg (ref 26.0–34.0)
MCHC: 31.5 g/dL (ref 30.0–36.0)
MCV: 97.3 fL (ref 78.0–100.0)
RBC: 2.94 MIL/uL — ABNORMAL LOW (ref 3.87–5.11)

## 2012-08-29 MED ORDER — FUROSEMIDE 10 MG/ML IJ SOLN: 40.0000 mg | Freq: Once | INTRAMUSCULAR | Status: DC

## 2012-08-29 MED ORDER — FUROSEMIDE 10 MG/ML IJ SOLN
20.0000 mg | Freq: Once | INTRAMUSCULAR | Status: AC
  Administered 2012-08-29: 20 mg via INTRAVENOUS
  Filled 2012-08-29: qty 2

## 2012-08-29 NOTE — Progress Notes (Signed)
[facility] Progress Note [place] TEAM 1 - Stepdown/ICU TEAM   Melanie Liu UXL:[id number] DOB: 09-30-1950 DOA: 08/27/2012 PCP: [name], MD  Brief narrative: 62 y.o. female with hx of lung cancer Dx 2013 with brain mets s/p chemotherapy, saw Dr Shirline Frees week before admit and was found to have a moderate size pericardial effusion.  She  presented to the ER with chest pain for 4 days with increasing shortness of breath. She said her chest pain came acutely at rest, was pleuritic in nature, and improved when sitting up. There had been no calf tenderness and no lower extremity swelling. Dr Shirline Frees had made referral to [facility] to have her pericardial effusion evaluated, but she had not been able to obtain an appointment.   Evaluation in the ER included an EKG which showed no acute ST-T changes, but there was electrical alternans. Her CXR showed CM, and no infiltrates, but with large known pulmonary mass, and bilateral pleural effusion. She had no leukocytosis, and her electrolytes and renal fx tests were normal. Hospitalists admitted the pt.    Shortly after admission the pt was transferred to [facility].  She underwent a TTE which revealed a significant pericardial effusion.  TCTS was consulted, and the pt underwent a subxiphoid pericardial window on 6/26.  Assessment/Plan:  Pericardial effusion  Metastatic non-small cell lung cancer, adenocarcinoma - Right Upper Lobe Lung Mass, 8 cm negative EGFR mutation and negative ALK gene translocation - diagnosed in September of 2013 - brain mets: 17 mm Rt Frontal s/p SRS, Lt Frontal 5 mm and Rt Parietal 18 mm mets s/p SRS, new 5 mm Rt Parietal met - 2 liver lesions - curently Onc plans to "consider the patient for treatment with second line chemotherapy in the form of docetaxel oral Tarceva"  Esophageal stricture w/ Dysphagia   Code Status: FULL Family Communication:  Disposition Plan: per  TCTS  Consultants: [facility] Cardiology - [facility]  Procedures: 6/25 - TTE - EF 45-50% - grade 1 DD - mod to large pericardial effusion  6/26 - Subxiphoid pericardial window  Antibiotics: Cefuroxime 6/26  DVT prophylaxis: SCDs  HPI/Subjective: Pt is under the care of TCTS at this time.     Objective: Blood pressure 95/61, pulse 98, temperature 97.6 F (36.4 C), temperature source Oral, resp. rate 13, height 5\' 2"  (1.575 m), weight 47.5 kg (104 lb 11.5 oz), SpO2 89.00%.  Intake/Output Summary (Last 24 hours) at 08/29/12 0812 Last data filed at 08/29/12 0700  Gross per 24 hour  Intake 3873.33 ml  Output   2120 ml  Net 1753.33 ml     Exam: No exam today   Data Reviewed: Basic Metabolic Panel:  Recent Labs Lab 08/27/12 0328 08/27/12 0825 08/27/12 1615 08/29/12 0415  NA 138  --  137 136  K 4.1  --  4.5 4.2  CL 104  --  97 100  CO2  --   --  30 28  GLUCOSE 91  --  86 169*  BUN 13  --  11 9  CREATININE 0.80 0.71 0.76 0.57  CALCIUM  --   --  9.6 8.9   Liver Function Tests:  Recent Labs Lab 08/27/12 1615  AST 11  ALT <5  ALKPHOS 100  BILITOT 0.2*  PROT 6.5  ALBUMIN 2.6*   CBC:  Recent Labs Lab 08/27/12 0200 08/27/12 0328 08/27/12 0825 08/27/12 1615 08/29/12 0415  WBC 11.1*  --  9.6 8.1 12.7*  HGB 8.3* 9.5* 8.0* 8.3* 9.0*  HCT 28.1* 28.0* 26.1* 27.1* 28.6*  MCV 98.6  --  97.8 97.8 97.3  PLT 506*  --  452* 481* 411*   Cardiac Enzymes:  Recent Labs Lab 08/27/12 0200 08/27/12 0825 08/27/12 1420 08/27/12 2125  TROPONINI <0.30 <0.30 <0.30 <0.30   BNP (last 3 results)  Recent Labs  08/27/12 0200  PROBNP 635.4*     Recent Results (from the past 240 hour(s))  MRSA PCR SCREENING     Status: None   Collection Time    08/27/12  7:41 AM      Result Value Range Status   MRSA by PCR NEGATIVE  NEGATIVE Final   Comment:            The GeneXpert MRSA Assay (FDA     approved for NASAL specimens     only), is one component of a     comprehensive  MRSA colonization     surveillance program. It is not     intended to diagnose MRSA     infection nor to guide or     monitor treatment for     MRSA infections.  SURGICAL PCR SCREEN     Status: None   Collection Time    08/27/12  3:37 PM      Result Value Range Status   MRSA, PCR NEGATIVE  NEGATIVE Final   Staphylococcus aureus NEGATIVE  NEGATIVE Final   Comment:            The Xpert SA Assay (FDA     approved for NASAL specimens     in patients over 3 years of age),     is one component of     a comprehensive surveillance     program.  Test performance has     been validated by [employer] for patients greater     than or equal to 36 year old.     It is not intended     to diagnose infection nor to     guide or monitor treatment.  BODY FLUID CULTURE     Status: None   Collection Time    08/28/12  8:29 AM      Result Value Range Status   Specimen Description FLUID PERICARDIAL   Final   Special Requests NONE   Final   Gram Stain     Final   Value: RARE WBC PRESENT, PREDOMINANTLY PMN     NO ORGANISMS SEEN   Culture PENDING   Incomplete   Report Status PENDING   Incomplete  TISSUE CULTURE     Status: None   Collection Time    08/28/12  8:35 AM      Result Value Range Status   Specimen Description TISSUE PERICARDIUM   Final   Special Requests NONE   Final   Gram Stain PENDING   Incomplete   Culture NO GROWTH 1 DAY   Final   Report Status PENDING   Incomplete     Studies:  Recent x-ray studies have been reviewed in detail by the Attending Physician  Scheduled Meds:  Scheduled Meds: . bisacodyl  10 mg Oral Daily  . dexamethasone  4 mg Oral BID  . folic acid  1 mg Oral Daily  . sucralfate  1 g Oral QAC breakfast    Time spent on care of this patient: no charge today   [facility]  [facility] Office  [phone] Pager - Text Page per Loretha Stapler as  per below:  On-Call/Text Page:      [email or web address]      password [email or web address]  If 7PM-7AM, please  contact night-coverage [email or web address] Password [email or web address] 08/29/2012, 8:12 AM   LOS: 2 days

## 2012-08-29 NOTE — Op Note (Signed)
NAMEPOET, HINEMAN NO.:  [id number]  MEDICAL RECORD NO.:  [id number]  LOCATION:  2303                         FACILITY:  [facility]  PHYSICIAN:  Burna Forts, M.D.DATE OF BIRTH:  1950-08-05  DATE OF PROCEDURE:  08/28/2012 DATE OF DISCHARGE:                              OPERATIVE REPORT   INDICATIONS FOR PROCEDURE:  Ms. Mallinger is a 62 year old woman who presents today for drainage of a moderate-to-large pericardial effusion, to be performed by Dr. Kathlee Nations Trigt.  We have been asked to place TEE probe for evaluation of cardiac structures and function both pre- and postdrainage.  The patient was taken to the OR for routine induction of general anesthesia, after which, the TEE probe was prepared and passed oropharyngeally into the stomach, then withdrawn for imaging of the cardiac structures.  In the short axis view of the left ventricular chamber area, we could see a moderate-to-large pericardial effusion immediately.  It is primarily is largest extent in the posterior and posterolateral area of the heart.  However, it is in fact circumferential in that there are fluid layers both anterior and posterior to the heart.  There is also a sort of fibrinous layer around the pericardium of the heart itself.  Routine exam of the other cardiac structures including the left ventricle and valves and atria showed essentially normal structures. There was normal ejection fraction and normal valves appreciated.  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  There was also some mild right atrial collapse noted with this larger effusion.  Postdrainage of the effusion pictures are the following:  The short-axis view of the left ventricular chamber now shows complete drainage of the moderate-to-large pericardial effusion.  Four-chamber view again shows essentially no effusion remain.  The rest of the cardiac  examination was as previously described.  This patient was awakened and taken to the PACU in stable condition.          ______________________________ Burna Forts, M.D.     JTM/MEDQ  D:  08/28/2012  T:  08/29/2012  Job:  [id number]

## 2012-08-29 NOTE — Progress Notes (Addendum)
Pt. Seen and examined. Agree with the NP/PA-C note as written.  She reports significant improvement after pericardial and pleural drainage by CT surgery. No chest pain reported. Await cytology, however, suspect tumor cells +/- inflammatory cells - the effusion was not described as particularly bloody. Blood pressure continues to be low normal.  We will arrange follow-up in our office after discharge with Dr. Tresa Endo in a few months to monitor for re-occurrence.  Will sign-off for now. Appreciate CT surgery management.  Chrystie Nose, MD, [facility] Attending Cardiologist [facility]

## 2012-08-29 NOTE — Op Note (Signed)
Melanie, Liu NO.:  [id number]  MEDICAL RECORD NO.:  [id number]  LOCATION:  2303                         FACILITY:  [facility]  PHYSICIAN:  Kerin Perna, M.D.  DATE OF BIRTH:  01/12/1951  DATE OF PROCEDURE:  08/28/2012 DATE OF DISCHARGE:                              OPERATIVE REPORT   OPERATION:  Subxiphoid pericardial window.  SURGEON:  Kerin Perna, M.D.  ASSISTANT:  Coral Ceo, P.A-C.  ANESTHESIA:  General.  INDICATIONS:  The patient is a 62 year old Caucasian female with a history of stage IV lung cancer, undergoing chemoradiation.  She presented with extreme shortness of breath and orthopnea and a 2D echo showed a large pericardial effusion, which had increased in size from her previous echo 2 weeks previously.  Subxiphoid pericardial window was recommended and I discussed the procedure in detail with the patient, including the expected benefits, alternatives, and potential risks of recurrent effusion and bleeding and infection.  She agreed to proceed with surgery under what I felt was an informed consent.  OPERATIVE PROCEDURE:  The patient was brought from the ICU to the OR where general anesthesia was induced.  A transesophageal 2D echo probe was placed, which showed the large pericardial effusion.  The patient was prepped and draped as a sterile field.  A proper time-out was performed.  A small incision was made based on the xiphoid.  Part of the xiphoid was removed.  The sternal elevating retractor was placed and the soft tissue anterior to the pericardium was dissected.  An opening was made into the left pleural space and 700 mL of left pleural fluid were drained.  An incision was made in the pericardium and fluid under pressure immediately exited.  Approximately 500-600 mL of serosanguineous fluid was removed.  A generous window was removed in the size of a silver dollar.  This was thickened pericardium with fibrinous exudate.   The surface of the heart or epicardium was also involved with an inflammatory fibrinous exudate.  This was irrigated with sterile saline and the pericardium was drained with a 28-French angled tube.  The left pleural space was drained with a 20-French chest tube.  Both were brought out through separate incisions and secured.  The retractor was removed and the incision was closed in layers using Vicryl.  The patient was then extubated and returned to the recovery room in stable condition.     Kerin Perna, M.D.     PV/MEDQ  D:  08/28/2012  T:  08/29/2012  Job:  [id number]  cc:   Lajuana Matte, M.D.

## 2012-08-29 NOTE — Progress Notes (Signed)
[facility]  Subjective: Pt reports that her breathing has improved significantly. No chest pain. Feeling better overall.   Objective: Vital signs in last 24 hours: Temp:  [97.3 F (36.3 C)-98.4 F (36.9 C)] 97.6 F (36.4 C) (06/27 0749) Pulse Rate:  [87-104] 98 (06/27 0500) Resp:  [6-26] 13 (06/27 0500) BP: (88-120)/(61-74) 95/61 mmHg (06/27 0500) SpO2:  [87 %-100 %] 89 % (06/27 0500) Arterial Line BP: (81-139)/(54-82) 100/61 mmHg (06/27 0500) FiO2 (%):  [40 %-50 %] 40 % (06/26 1600) Last BM Date: 08/27/12  Intake/Output from previous day: 06/26 0701 - 06/27 0700 In: 3873.3 [P.O.:360; I.V.:3013.3; IV Piggyback:500] Out: 2120 [Urine:1935; Blood:25; Chest Tube:160] Intake/Output this shift:    Medications Current Facility-Administered Medications  Medication Dose Route Frequency Provider Last Rate Last Dose  . albuterol (PROVENTIL) (5 MG/ML) 0.5% nebulizer solution 2.5 mg  2.5 mg Nebulization Q4H PRN Houston Siren, MD      . bisacodyl (DULCOLAX) EC tablet 10 mg  10 mg Oral Daily Gina L Collins, PA-C      . dexamethasone (DECADRON) tablet 4 mg  4 mg Oral BID Gina L Collins, PA-C      . dextrose 5 % and 0.45 % NaCl with KCl 20 mEq/L infusion   Intravenous Continuous Wilmon Pali, PA-C 100 mL/hr at 08/28/12 2200    . folic acid (FOLVITE) tablet 1 mg  1 mg Oral Daily Houston Siren, MD   1 mg at 08/27/12 0941  . ipratropium (ATROVENT) nebulizer solution 0.5 mg  0.5 mg Nebulization Q4H PRN Houston Siren, MD      . morphine 2 MG/ML injection 2 mg  2 mg Intravenous Q2H PRN Wilmon Pali, PA-C   2 mg at 08/28/12 1614  . ondansetron (ZOFRAN) injection 4 mg  4 mg Intravenous Q6H PRN Wilmon Pali, PA-C      . oxyCODONE (Oxy IR/ROXICODONE) immediate release tablet 5-10 mg  5-10 mg Oral Q4H PRN Wilmon Pali, PA-C   5 mg at 08/28/12 1822   Followed by  . oxyCODONE-acetaminophen (PERCOCET/ROXICET) 5-325 MG per tablet 1-2 tablet  1-2 tablet Oral Q4H PRN Wilmon Pali,  PA-C      . oxyCODONE-acetaminophen (PERCOCET/ROXICET) 5-325 MG per tablet 1-2 tablet  1-2 tablet Oral Q4H PRN Wilmon Pali, PA-C   2 tablet at 08/29/12 0751  . potassium chloride 10 mEq in 50 mL *CENTRAL LINE* IVPB  10 mEq Intravenous Daily PRN Wilmon Pali, PA-C      . senna-docusate (Senokot-S) tablet 1 tablet  1 tablet Oral QHS PRN Wilmon Pali, PA-C      . sucralfate (CARAFATE) tablet 1 g  1 g Oral QAC breakfast Houston Siren, MD   1 g at 08/29/12 0741  . traMADol (ULTRAM) tablet 50-100 mg  50-100 mg Oral Q6H PRN Wilmon Pali, PA-C        PE: General appearance: alert, cooperative and no distress Lungs: decreased BS bilatearlly Heart: regular rate and rhythm Extremities: no LEE Pulses: 2+ and symmetric Skin: warm and dry Neurologic: Grossly normal  Lab Results:   Recent Labs  08/27/12 0825 08/27/12 1615 08/29/12 0415  WBC 9.6 8.1 12.7*  HGB 8.0* 8.3* 9.0*  HCT 26.1* 27.1* 28.6*  PLT 452* 481* 411*   BMET  Recent Labs  08/27/12 0328 08/27/12 0825 08/27/12 1615 08/29/12 0415  NA 138  --  137 136  K 4.1  --  4.5 4.2  CL 104  --  97 100  CO2  --   --  30 28  GLUCOSE 91  --  86 169*  BUN 13  --  11 9  CREATININE 0.80 0.71 0.76 0.57  CALCIUM  --   --  9.6 8.9   PT/INR  Recent Labs  08/27/12 1615  LABPROT 14.0  INR 1.10    Assessment/Plan  Principal Problem:   Pericardial effusion- s/p subxiphoid pericardial window 08/28/12 Active Problems:   Right Upper Lobe Lung Mass, 8 cm    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 POD#1. Approximately 500-600 mL of serosanguineous fluid was removed. Also, the left pleural space was drained. Approximately 700 mL was removed. Body fluid and tissue cultures are pending. Effusion likely  secondary to malignancy. Chest tube remains in place with an additional ~180 mL of fluid drainage. The patient reports significant improvement in breathing. No chest pain. She is hemodynamically stable. Will continue to monitor.    LOS: 2 days    Melanie Liu 08/29/2012 8:44 AM

## 2012-08-29 NOTE — Progress Notes (Signed)
Patient ID: Melanie Liu, female   DOB: [birth year], 62 y.o.   MRN: [id number] TCTS DAILY ICU PROGRESS NOTE                   [address]            [place] [address]          [phone]   1 Day Post-Op Procedure(s) (LRB): SUBXYPHOID PERICARDIAL WINDOW (N/A) TRANSESOPHAGEAL ECHOCARDIOGRAM (TEE)  Total Length of Stay:  LOS: 2 days   Subjective: Breathing better today  Objective: Vital signs in last 24 hours: Temp:  [97.3 F (36.3 C)-98.4 F (36.9 C)] 97.6 F (36.4 C) (06/27 0749) Pulse Rate:  [87-108] 93 (06/27 0900) Cardiac Rhythm:  [-] Normal sinus rhythm (06/27 0800) Resp:  [6-26] 19 (06/27 0900) BP: (88-118)/(61-74) 95/63 mmHg (06/27 0900) SpO2:  [86 %-100 %] 95 % (06/27 0900) Arterial Line BP: (81-126)/(48-82) 83/48 mmHg (06/27 0900) FiO2 (%):  [40 %-50 %] 40 % (06/26 1600)  Filed Weights   08/27/12 0715  Weight: 104 lb 11.5 oz (47.5 kg)    Weight change:    Hemodynamic parameters for last 24 hours:    Intake/Output from previous day: 06/26 0701 - 06/27 0700 In: 3873.3 [P.O.:360; I.V.:3013.3; IV Piggyback:500] Out: 2120 [Urine:1935; Blood:25; Chest Tube:160]  Intake/Output this shift: Total I/O In: 200 [I.V.:200] Out: 0   Current Meds: Scheduled Meds: . bisacodyl  10 mg Oral Daily  . dexamethasone  4 mg Oral BID  . folic acid  1 mg Oral Daily  . sucralfate  1 g Oral QAC breakfast   Continuous Infusions: . dextrose 5 % and 0.45 % NaCl with KCl 20 mEq/L 100 mL/hr at 08/28/12 2200   PRN Meds:.albuterol, ipratropium, morphine injection, ondansetron (ZOFRAN) IV, oxyCODONE, oxyCODONE-acetaminophen, oxyCODONE-acetaminophen, potassium chloride, senna-docusate, traMADol  General appearance: alert, cooperative and no distress Neurologic: intact Heart: regular rate and rhythm, S1, S2 normal, no murmur, click, rub or gallop Lungs: diminished breath sounds RUL Abdomen: soft, non-tender; bowel sounds normal; no masses,  no  organomegaly Extremities: extremities normal, atraumatic, no cyanosis or edema and Homans sign is negative, no sign of DVT Wound: some drainage from chest tubes, leave for now  Lab Results: CBC: Recent Labs  08/27/12 1615 08/29/12 0415  WBC 8.1 12.7*  HGB 8.3* 9.0*  HCT 27.1* 28.6*  PLT 481* 411*   BMET:  Recent Labs  08/27/12 1615 08/29/12 0415  NA 137 136  K 4.5 4.2  CL 97 100  CO2 30 28  GLUCOSE 86 169*  BUN 11 9  CREATININE 0.76 0.57  CALCIUM 9.6 8.9    PT/INR:  Recent Labs  08/27/12 1615  LABPROT 14.0  INR 1.10   Radiology: Dg Chest 2 View  08/27/2012   *RADIOLOGY REPORT*  Clinical Data: 62 year old female with chest pain.  Abnormal chest CTA. Right lung cancer.  CHEST - 2 VIEW  Comparison: Chest CTA 0459 hours the same day and earlier.  Findings: Stable lung volumes.  Bilateral pleural effusions. Stable heterogeneous opacification in the right apex.  Stable cardiac size and mediastinal contours.  No pneumothorax or pulmonary edema.  Overall stable ventilation.  IMPRESSION: Stable ventilation.  Bilateral pleural effusions and mixed density in the right apex.  See chest CTA report from earlier today.   Original Report Authenticated By: Erskine Speed, M.D.   Dg Chest Port 1 View  08/29/2012   *RADIOLOGY REPORT*  Clinical Data: Chest tubes.  PORTABLE CHEST -  1 VIEW  Comparison: 08/28/2012  Findings: Left chest tube remains in place.  No visible pneumothorax currently.  Continued right apical density, unchanged. Small bilateral pleural effusions.  Bibasilar atelectasis.  Heart is normal size.  IMPRESSION: Slight increase in bibasilar atelectasis and small effusions.  No visible pneumothorax currently.   Original Report Authenticated By: Charlett Nose, M.D.   Dg Chest Portable 1 View  08/28/2012   *RADIOLOGY REPORT*  Clinical Data: Postop pericardial window, chest tube, metastatic lung cancer post radiation therapy, COPD  PORTABLE CHEST - 1 VIEW  Comparison: Portable exam 0937  hours compared to 08/28/2012  Findings: Right jugular central venous catheter with tip projecting over SVC. Enlargement of cardiac silhouette. Mediastinal drain and left thoracostomy tube newly identified. Changes of COPD with chronic opacification of the right upper lobe due to known mass and right upper lobe atelectasis. Atelectasis versus infiltrate left lower lobe. Minimal left pleural effusion. Tiny left apex pneumothorax. Bones demineralized with evidence of prior cervical spine fusion.  IMPRESSION: Tiny left apex pneumothorax in patient with left thoracostomy tube. Enlargement of cardiac silhouette. Atelectasis versus consolidation left lower lobe with minimal left pleural effusion. Chronic opacification and volume loss of right upper lobe due to known mass and atelectasis.   Original Report Authenticated By: Ulyses Southward, M.D.   Dg Chest Port 1 View  08/28/2012   *RADIOLOGY REPORT*  Clinical Data: Shortness of breath, congestion.  PORTABLE CHEST - 1 VIEW  Comparison: 08/27/2012  Findings: Right apical mass/airspace density again noted, unchanged.  Small left pleural effusion, slightly enlarged.  Mild cardiomegaly.  IMPRESSION: Slight enlargement of left pleural effusion.  Otherwise no change.   Original Report Authenticated By: Charlett Nose, M.D.     Assessment/Plan: S/P Procedure(s) (LRB): SUBXYPHOID PERICARDIAL WINDOW (N/A) TRANSESOPHAGEAL ECHOCARDIOGRAM (TEE) Mobilize leave ct for now, d/c tomorrow     Daryl Beehler B 08/29/2012 9:45 AM

## 2012-08-29 NOTE — Progress Notes (Signed)
Patient ID: Melanie Liu, female   DOB: March 09, 1950, 62 y.o.   MRN: [id number] EVENING ROUNDS NOTE :     [address]       [place] [address]             [phone]                 1 Day Post-Op Procedure(s) (LRB): SUBXYPHOID PERICARDIAL WINDOW (N/A) TRANSESOPHAGEAL ECHOCARDIOGRAM (TEE)  Total Length of Stay:  LOS: 2 days  BP 95/67  Pulse 91  Temp(Src) 97.4 F (36.3 C) (Oral)  Resp 15  Ht 5\' 2"  (1.575 m)  Wt 104 lb 11.5 oz (47.5 kg)  BMI 19.15 kg/m2  SpO2 97%  .Intake/Output     06/26 0701 - 06/27 0700 06/27 0701 - 06/28 0700   P.O. 360    I.V. (mL/kg) 3013.3 (63.4) 400 (8.4)   IV Piggyback 500    Total Intake(mL/kg) 3873.3 (81.5) 400 (8.4)   Urine (mL/kg/hr) 1935 (1.7) 700 (1.3)   Blood 25 (0)    Chest Tube 160 (0.1) 140 (0.3)   Total Output 2120 840   Net +1753.3 -440          . dextrose 5 % and 0.45 % NaCl with KCl 20 mEq/L 20 mL/hr at 08/29/12 1030     Lab Results  Component Value Date   WBC 12.7* 08/29/2012   HGB 9.0* 08/29/2012   HCT 28.6* 08/29/2012   PLT 411* 08/29/2012   GLUCOSE 169* 08/29/2012   ALT <5 08/27/2012   AST 11 08/27/2012   NA 136 08/29/2012   K 4.2 08/29/2012   CL 100 08/29/2012   CREATININE 0.57 08/29/2012   BUN 9 08/29/2012   CO2 28 08/29/2012   TSH 3.323 08/27/2012   INR 1.10 08/27/2012   Stable day  Delight Ovens MD  Beeper [phone] Office [phone] 08/29/2012 6:38 PM

## 2012-08-30 ENCOUNTER — Inpatient Hospital Stay (HOSPITAL_COMMUNITY): Payer: Medicaid Other

## 2012-08-30 LAB — COMPREHENSIVE METABOLIC PANEL
Albumin: 2.1 g/dL — ABNORMAL LOW (ref 3.5–5.2)
BUN: 10 mg/dL (ref 6–23)
Calcium: 8.9 mg/dL (ref 8.4–10.5)
Creatinine, Ser: 0.63 mg/dL (ref 0.50–1.10)
Total Protein: 5.8 g/dL — ABNORMAL LOW (ref 6.0–8.3)

## 2012-08-30 LAB — CBC
HCT: 28.8 % — ABNORMAL LOW (ref 36.0–46.0)
MCHC: 30.6 g/dL (ref 30.0–36.0)
MCV: 97.3 fL (ref 78.0–100.0)
RDW: 17.9 % — ABNORMAL HIGH (ref 11.5–15.5)

## 2012-08-30 NOTE — Progress Notes (Signed)
Patient ID: Melanie Liu, female   DOB: 09/27/1950, 62 y.o.   MRN: [id number] TCTS DAILY ICU PROGRESS NOTE                   [address]            [place] [address]          [phone]   2 Days Post-Op Procedure(s) (LRB): SUBXYPHOID PERICARDIAL WINDOW (N/A) TRANSESOPHAGEAL ECHOCARDIOGRAM (TEE)  Total Length of Stay:  LOS: 3 days   Subjective: Feels better, some left back pain  Objective: Vital signs in last 24 hours: Temp:  [97.4 F (36.3 C)-98.1 F (36.7 C)] 97.9 F (36.6 C) (06/28 0734) Pulse Rate:  [90-118] 98 (06/28 0600) Cardiac Rhythm:  [-] Normal sinus rhythm (06/28 0400) Resp:  [11-26] 11 (06/28 0600) BP: (95-146)/(63-122) 101/76 mmHg (06/28 0600) SpO2:  [90 %-98 %] 94 % (06/28 0600) Arterial Line BP: (83-99)/(48-53) 83/48 mmHg (06/27 0900)  Filed Weights   08/27/12 0715  Weight: 104 lb 11.5 oz (47.5 kg)    Weight change:    Hemodynamic parameters for last 24 hours:    Intake/Output from previous day: 06/27 0701 - 06/28 0700 In: 730 [I.V.:680; IV Piggyback:50] Out: 2715 [Urine:2525; Chest Tube:190]  Intake/Output this shift:    Current Meds: Scheduled Meds: . bisacodyl  10 mg Oral Daily  . folic acid  1 mg Oral Daily  . sucralfate  1 g Oral QAC breakfast   Continuous Infusions: . dextrose 5 % and 0.45 % NaCl with KCl 20 mEq/L 20 mL/hr at 08/29/12 1030   PRN Meds:.albuterol, ipratropium, morphine injection, ondansetron (ZOFRAN) IV, oxyCODONE-acetaminophen, oxyCODONE-acetaminophen, potassium chloride, senna-docusate, traMADol  General appearance: alert and cooperative Neurologic: intact Heart: regular rate and rhythm, S1, S2 normal, no murmur, click, rub or gallop Lungs: diminished breath sounds RUL Abdomen: soft, non-tender; bowel sounds normal; no masses,  no organomegaly Extremities: extremities normal, atraumatic, no cyanosis or edema and Homans sign is negative, no sign of DVT Wound: no air leakfrom ct  Lab  Results: CBC: Recent Labs  08/29/12 0415 08/30/12 0355  WBC 12.7* 13.3*  HGB 9.0* 8.8*  HCT 28.6* 28.8*  PLT 411* 369   BMET:  Recent Labs  08/29/12 0415 08/30/12 0355  NA 136 137  K 4.2 3.7  CL 100 99  CO2 28 31  GLUCOSE 169* 93  BUN 9 10  CREATININE 0.57 0.63  CALCIUM 8.9 8.9    PT/INR:  Recent Labs  08/27/12 1615  LABPROT 14.0  INR 1.10   Radiology: Dg Chest Port 1 View  08/29/2012   *RADIOLOGY REPORT*  Clinical Data: Chest tubes.  PORTABLE CHEST - 1 VIEW  Comparison: 08/28/2012  Findings: Left chest tube remains in place.  No visible pneumothorax currently.  Continued right apical density, unchanged. Small bilateral pleural effusions.  Bibasilar atelectasis.  Heart is normal size.  IMPRESSION: Slight increase in bibasilar atelectasis and small effusions.  No visible pneumothorax currently.   Original Report Authenticated By: Charlett Nose, M.D.   Dg Chest Portable 1 View  08/28/2012   *RADIOLOGY REPORT*  Clinical Data: Postop pericardial window, chest tube, metastatic lung cancer post radiation therapy, COPD  PORTABLE CHEST - 1 VIEW  Comparison: Portable exam 0937 hours compared to 08/28/2012  Findings: Right jugular central venous catheter with tip projecting over SVC. Enlargement of cardiac silhouette. Mediastinal drain and left thoracostomy tube newly identified. Changes of COPD with chronic opacification of the right upper lobe due  to known mass and right upper lobe atelectasis. Atelectasis versus infiltrate left lower lobe. Minimal left pleural effusion. Tiny left apex pneumothorax. Bones demineralized with evidence of prior cervical spine fusion.  IMPRESSION: Tiny left apex pneumothorax in patient with left thoracostomy tube. Enlargement of cardiac silhouette. Atelectasis versus consolidation left lower lobe with minimal left pleural effusion. Chronic opacification and volume loss of right upper lobe due to known mass and atelectasis.   Original Report Authenticated By:  Ulyses Southward, M.D.     Assessment/Plan: S/P Procedure(s) (LRB): SUBXYPHOID PERICARDIAL WINDOW (N/A) TRANSESOPHAGEAL ECHOCARDIOGRAM (TEE) Mobilize Diuresis d/c tubes/lines Plan for transfer to step-down: see transfer orders     Andersen Iorio B 08/30/2012 7:39 AM

## 2012-08-30 NOTE — Progress Notes (Signed)
Transferred to 2017 via wheelchair. Portable monitor and oxygen on.  No changes. 

## 2012-08-31 LAB — TYPE AND SCREEN
ABO/RH(D): O POS
Antibody Screen: NEGATIVE
Unit division: 0
Unit division: 0

## 2012-08-31 LAB — BODY FLUID CULTURE: Culture: NO GROWTH

## 2012-08-31 LAB — TISSUE CULTURE
Culture: NO GROWTH
Gram Stain: NONE SEEN

## 2012-08-31 NOTE — Progress Notes (Signed)
Pt ambulated in hallway 350 ft with wheelchair on Room Air, oxygen sats dropped to 85 % on Room Air. Pt placed on 2 liters, sats came up to 88 %. Pt increased to 3 Liters of oxygen and sats came back up to 92-93 %. Pt assisted back to bed with call bell within reach. Will continue to monitor.

## 2012-08-31 NOTE — Progress Notes (Signed)
Pt ambulated in hallway 400 ft with wheelchair on 3 liters of oxygen and tolerated activity well. Will continue to monitor.

## 2012-08-31 NOTE — Progress Notes (Signed)
Patient refused to ambulate this pm. Stated she will walk tomorrow.

## 2012-08-31 NOTE — Progress Notes (Signed)
Pt refused to walk at this time. Pt states she is sleepy and wants to sleep. Will continue to monitor

## 2012-08-31 NOTE — Progress Notes (Addendum)
       [address]       [place] [address]             [phone]          3 Days Post-Op Procedure(s) (LRB): SUBXYPHOID PERICARDIAL WINDOW (N/A) TRANSESOPHAGEAL ECHOCARDIOGRAM (TEE)  Subjective: Comfortable, no complaints.   Objective: Vital signs in last 24 hours: Patient Vitals for the past 24 hrs:  BP Temp Temp src Pulse Resp SpO2 Weight  08/31/12 0512 97/65 mmHg 97.5 F (36.4 C) Oral 118 18 92 % 100 lb 15.5 oz (45.8 kg)  08/30/12 2142 93/58 mmHg 97.5 F (36.4 C) Oral 94 18 99 % -  08/30/12 1406 90/61 mmHg 97.8 F (36.6 C) Oral 106 18 96 % -  08/30/12 1100 106/76 mmHg 98.1 F (36.7 C) Oral 118 18 94 % -  08/30/12 1000 99/68 mmHg - - 105 18 96 % -   Current Weight  08/31/12 100 lb 15.5 oz (45.8 kg)     Intake/Output from previous day: 06/28 0701 - 06/29 0700 In: 550 [P.O.:390; I.V.:60; IV Piggyback:100] Out: 725 [Urine:725]    PHYSICAL EXAM:  Heart: RRR Lungs: Clear Wound: Clean and dry    Lab Results: CBC: Recent Labs  08/29/12 0415 08/30/12 0355  WBC 12.7* 13.3*  HGB 9.0* 8.8*  HCT 28.6* 28.8*  PLT 411* 369   BMET:  Recent Labs  08/29/12 0415 08/30/12 0355  NA 136 137  K 4.2 3.7  CL 100 99  CO2 28 31  GLUCOSE 169* 93  BUN 9 10  CREATININE 0.57 0.63  CALCIUM 8.9 8.9    PT/INR: No results found for this basename: LABPROT, INR,  in the last 72 hours    Assessment/Plan: S/P Procedure(s) (LRB): SUBXYPHOID PERICARDIAL WINDOW (N/A) TRANSESOPHAGEAL ECHOCARDIOGRAM (TEE) Stable from surgical standpoint. Path pending, cytology negative so far. Wean and d/c O2 as able. Possibly home 1-2 days if she continues to progress.   LOS: 4 days    COLLINS,GINA H 08/31/2012  Feels better today Plan d/c tomorrow I have seen and examined Melanie Liu and agree with the above assessment  and plan.  Delight Ovens MD Beeper [phone] Office [phone] 08/31/2012 10:15 AM

## 2012-08-31 NOTE — Discharge Summary (Signed)
[address]       [place] [address]             [phone]              Discharge Summary  Name: Melanie Liu DOB: May 24, 1950 62 y.o. MRN: [id number]   Admission Date: 08/27/2012 Discharge Date:     Admitting Diagnosis: Shortness of breath   Discharge Diagnosis:  Pericardial effusion  Past Medical History  Diagnosis Date  . Nodule of right lung     right upper lobe  . Anemia   . Rectovaginal fistula   . Right frontal lobe lesion 12/13/11    CT head  . History of radiation therapy 12/24/11-02/08/12    rul 66Gy/34fxs  . Dysphagia   . History of radiation therapy 12/27/11    SRS rt ant frontal 20gy  . Lung cancer 11/23/11    biopsy-right  . Brain metastases   . Chest pain at rest 08/26/2012  . COPD (chronic obstructive pulmonary disease)   . Shortness of breath      Procedures: SUBXYPHOID PERICARDIAL WINDOW - 08/28/2012   HPI:  The patient is a 62 y.o. 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  She has completed a course of radiation therapy to the chest and brain and is undergoing chemotherapy. She presented to the ER at [facility] of the day of admission with several days of progressive shortness of breath and chest pain.  She had been seen the previous week by [name] and was found to have a moderate sized pericardial effusion.  She was scheduled to see [facility] cardiology as an outpatient, but her symptoms worsened, and she came in for evaluation.  2-D echocardiogram showed an enlarged pericardial effusion. LV ejection fraction is 45%. There was no evidence of classic tamponade. She was admitted to the CCU for further management.   Hospital Course:  The patient was admitted to [facility] on 08/27/2012. Cardiology saw the patient and felt that she would require operative drainage.  A cardiac surgery consult was requested and Dr. Donata Clay saw the patient and reviewed her films.  He agreed with the need for  pericardial window. All risks, benefits and alternatives of surgery were explained in detail, and the patient agreed to proceed. The patient was taken to the operating room and underwent the above procedure.    The postoperative course has generally been uneventful.  Her chest tubes have been removed in the standard fashion, and follow up chest x-rays have remained stable.  She has improved symptomatically, and is currently being weaned from supplemental oxygen.  She is ambulating in the halls without difficulty, and is tolerating a regular diet.  Fluid cytology is negative for malignancy, and cultures have been negative to date. Final pathology remains pending.  We anticipate discharge in the next 24-48 hours provided she continues to progress.  Addendum:  The patient is reviewed on 09/01/2012 and appears stable for discharge at this time, however will require home oxygen. This will be arranged prior to discharge.   Recent vital signs:  Filed Vitals:   08/31/12 0512  BP: 97/65  Pulse: 118  Temp: 97.5 F (36.4 C)  Resp: 18    Recent laboratory studies:  CBC: Recent Labs  08/29/12 0415 08/30/12 0355  WBC 12.7* 13.3*  HGB 9.0* 8.8*  HCT 28.6* 28.8*  PLT 411* 369   BMET:  Recent Labs  08/29/12 0415  08/30/12 0355  NA 136 137  K 4.2 3.7  CL 100 99  CO2 28 31  GLUCOSE 169* 93  BUN 9 10  CREATININE 0.57 0.63  CALCIUM 8.9 8.9    PT/INR: No results found for this basename: LABPROT, INR,  in the last 72 hours   Discharge Medications:     Medication List         albuterol (5 MG/ML) 0.5% nebulizer solution  Commonly known as:  PROVENTIL  Take 0.5 mLs (2.5 mg total) by nebulization every 4 (four) hours as needed for wheezing or shortness of breath.     CARAFATE 1 G tablet  Generic drug:  sucralfate  Take 1 g by mouth daily.     dexamethasone 4 MG tablet  Commonly known as:  DECADRON  Take 4 mg by mouth 2 (two) times daily.     folic acid 1 MG tablet  Commonly known  as:  FOLVITE  Take 1 tablet (1 mg total) by mouth daily.     ipratropium 0.02 % nebulizer solution  Commonly known as:  ATROVENT  Take 2.5 mLs (0.5 mg total) by nebulization every 4 (four) hours as needed (pt mixes with albuterol).     oxyCODONE-acetaminophen 5-325 MG per tablet  Commonly known as:  PERCOCET/ROXICET  Take 1 tablet by mouth every 4 (four) hours as needed for pain.     oxyCODONE-acetaminophen 5-325 MG per tablet  Commonly known as:  PERCOCET/ROXICET  Take 1-2 tablets by mouth every 4 (four) hours as needed.         Discharge Instructions:  The patient is to refrain from driving, heavy lifting or strenuous activity.  May shower daily and clean incisions with soap and water.  May resume regular diet.   Follow Up:       Future Appointments Provider Department Dept Phone   09/01/2012 8:15 AM [facility] [phone]   09/03/2012 9:45 AM Tonny Bollman, MD [facility] Main Office [place]) [phone]   09/15/2012 10:00 AM [name], MD [facility] [phone]   09/15/2012 11:30 AM [facility] [place] [phone]      Follow-up Information   Follow up with [facility] Pa K., MD. (Follow up as directed)    Contact information:   [address] [place] [address] [phone]       Follow up with Lennette Bihari, MD. (Office will arrange follow up)    Contact information:   [address] [place] [address] [phone]       Follow up with VAN Dinah Beers, MD. (Office will call you with an appointment)    Contact information:   [address] [place] [address] [phone]        Adella Hare 08/31/2012, 10:43 AM

## 2012-08-31 NOTE — Progress Notes (Signed)
Left Central Line d/c'ed per MD order and protocol. Occlusive dressing applied. Pt tolerated procedure well. Pt reminded to keep occlusive dressing in place for 24 hours. Pt resting in bed X 1 hour with family at bedside, call bell within reach. Will continue to monitor.

## 2012-09-01 ENCOUNTER — Inpatient Hospital Stay (HOSPITAL_COMMUNITY): Payer: Medicaid Other

## 2012-09-01 ENCOUNTER — Other Ambulatory Visit: Payer: Self-pay

## 2012-09-01 MED ORDER — OXYCODONE-ACETAMINOPHEN 5-325 MG PO TABS: 1.0000 | ORAL_TABLET | ORAL | Status: DC | PRN

## 2012-09-01 NOTE — Progress Notes (Signed)
Discharge instructions given to pt along with prescription for pain. Pt verbalized her understanding. Removed chest tube sutures per MD order, placed steri strips on site with benzoin. Pt tolerated well. Pt is stable for discharge with daughter. Awaiting home O2 delivery, then will discharge pt to car.

## 2012-09-01 NOTE — Progress Notes (Addendum)
4 Days Post-Op  Procedure(s) (LRB): SUBXYPHOID PERICARDIAL WINDOW (N/A) TRANSESOPHAGEAL ECHOCARDIOGRAM (TEE) Subjective: Feels ok, no complaints, unable to wean O2 off  Objective  Telemetry sinus tachy  Temp:  [97.9 F (36.6 C)-98.2 F (36.8 C)] 98.1 F (36.7 C) (06/30 0418) Pulse Rate:  [84-109] 97 (06/30 0418) Resp:  [18-20] 18 (06/30 0418) BP: (92-99)/(62-70) 92/63 mmHg (06/30 0418) SpO2:  [96 %-98 %] 98 % (06/30 0418) Weight:  [100 lb 12.8 oz (45.723 kg)] 100 lb 12.8 oz (45.723 kg) (06/30 0418)   Intake/Output Summary (Last 24 hours) at 09/01/12 0728 Last data filed at 09/01/12 0255  Gross per 24 hour  Intake      0 ml  Output   1000 ml  Net  -1000 ml       General appearance: alert, cooperative and no distress Heart: regular rate and rhythm Lungs: dim in left base Abdomen: soft, nontender Extremities: no edema Wound: incisions healing well  Lab Results:  Recent Labs  08/30/12 0355  NA 137  K 3.7  CL 99  CO2 31  GLUCOSE 93  BUN 10  CREATININE 0.63  CALCIUM 8.9    Recent Labs  08/30/12 0355  AST 10  ALT <5  ALKPHOS 97  BILITOT 0.1*  PROT 5.8*  ALBUMIN 2.1*   No results found for this basename: LIPASE, AMYLASE,  in the last 72 hours  Recent Labs  08/30/12 0355  WBC 13.3*  HGB 8.8*  HCT 28.8*  MCV 97.3  PLT 369   No results found for this basename: CKTOTAL, CKMB, TROPONINI,  in the last 72 hours No components found with this basename: POCBNP,  No results found for this basename: DDIMER,  in the last 72 hours No results found for this basename: HGBA1C,  in the last 72 hours No results found for this basename: CHOL, HDL, LDLCALC, TRIG, CHOLHDL,  in the last 72 hours No results found for this basename: TSH, T4TOTAL, FREET3, T3FREE, THYROIDAB,  in the last 72 hours No results found for this basename: VITAMINB12, FOLATE, FERRITIN, TIBC, IRON, RETICCTPCT,  in the last 72 hours  Medications: Scheduled . bisacodyl  10 mg Oral Daily  .  folic acid  1 mg Oral Daily  . sucralfate  1 g Oral QAC breakfast     Radiology/Studies:  No results found.  INR: Will add last result for INR, ABG once components are confirmed Will add last 4 CBG results once components are confirmed  Assessment/Plan: S/P Procedure(s) (LRB): SUBXYPHOID PERICARDIAL WINDOW (N/A) TRANSESOPHAGEAL ECHOCARDIOGRAM (TEE)  1 steady progress. But not off O2 , wean as able may need short term home O2- will arrange for and if needs will be avail.  2 path - cytology non malignant    LOS: 5 days    GOLD,WAYNE E 6/30/20147:28 AM  patient examined and medical record reviewed,agree with above note. VAN TRIGT III,PETER 09/01/2012 DC home on O2 2l/min

## 2012-09-01 NOTE — Care Management Note (Signed)
    Page 1 of 1   09/01/2012     4:35:40 PM   CARE MANAGEMENT NOTE 09/01/2012  Patient:  Melanie Liu, Melanie Liu   Account Number:  [id number]  Date Initiated:  08/27/2012  Documentation initiated by:  Junius Creamer  Subjective/Objective Assessment:   adm w pericardial effusion     Action/Plan:   lives alone, pcp dr Darcel Bayley [place]   Anticipated DC Date:  09/02/2012   Anticipated DC Plan:  HOME W HOME HEALTH SERVICES      DC Planning Services  CM consult      Choice offered to / List presented to:     DME arranged  [facility]      DME agency  [facility]        Status of service:  Completed, signed off Medicare Important Message given?   (If response is "NO", the following Medicare IM given date fields will be blank) Date Medicare IM given:   Date Additional Medicare IM given:    Discharge Disposition:  HOME/SELF CARE  Per UR Regulation:  Reviewed for med. necessity/level of care/duration of stay  If discussed at Long Length of Stay Meetings, dates discussed:    Comments:  Contact:  Mabe,Nora Sister [phone]   Ellin Mayhew Daughter     [phone]   09/01/12 Sorina Derrig,RN,BSN [phone] PT FOR DC HOME WITH FAMILY TODAY.  NEEDS HOME OXYGEN SET UP, AS QUALIFIES FOR HOME O2.  REFERRAL TO [facility] FOR DME NEEDS.  PORTABLE TANK DELIVERED PRIOR TO DC.

## 2012-09-01 NOTE — Progress Notes (Signed)
SATURATION QUALIFICATIONS: (This note is used to comply with regulatory documentation for home oxygen)  Patient Saturations on Room Air at Rest = 85%  Patient Saturations on Room Air while Ambulating = 85%  Patient Saturations on 3 Liters of oxygen while Ambulating = 94%  Please briefly explain why patient needs home oxygen:

## 2012-09-02 LAB — ANAEROBIC CULTURE: Gram Stain: NONE SEEN

## 2012-09-03 ENCOUNTER — Other Ambulatory Visit: Payer: Self-pay | Admitting: *Deleted

## 2012-09-03 ENCOUNTER — Ambulatory Visit: Payer: Self-pay | Admitting: Cardiovascular Disease

## 2012-09-03 ENCOUNTER — Other Ambulatory Visit: Payer: Self-pay | Admitting: Internal Medicine

## 2012-09-03 NOTE — Progress Notes (Signed)
Per Dr Donnald Garre, pt needs lab and f/u within 1 week.  SLJ

## 2012-09-08 ENCOUNTER — Other Ambulatory Visit: Payer: Self-pay | Admitting: Medical Oncology

## 2012-09-09 ENCOUNTER — Other Ambulatory Visit: Payer: Self-pay | Admitting: *Deleted

## 2012-09-09 ENCOUNTER — Telehealth: Payer: Self-pay | Admitting: Internal Medicine

## 2012-09-09 NOTE — Discharge Summary (Signed)
patient examined and medical record reviewed,agree with above note. VAN TRIGT III,Tige Meas 09/09/2012   

## 2012-09-09 NOTE — Telephone Encounter (Signed)
lmonvm for pt re appt for 7/9 @ 9:15am lb/MM> received vm from desk nurwswe 7/7 to check on pof sent 7/2 for lb and 1wk f/u. pof was not sent to scheduling inbox.

## 2012-09-10 ENCOUNTER — Other Ambulatory Visit: Payer: Medicaid Other | Admitting: Lab

## 2012-09-10 ENCOUNTER — Ambulatory Visit: Payer: Medicaid Other | Admitting: Internal Medicine

## 2012-09-11 ENCOUNTER — Encounter: Payer: Self-pay | Admitting: Internal Medicine

## 2012-09-11 NOTE — Progress Notes (Signed)
Put disability form on nurse's desk. °

## 2012-09-12 ENCOUNTER — Encounter: Payer: Self-pay | Admitting: Internal Medicine

## 2012-09-12 ENCOUNTER — Telehealth: Payer: Self-pay | Admitting: Medical Oncology

## 2012-09-12 NOTE — Progress Notes (Signed)
Faxed disability form to [employer] @ [phone].

## 2012-09-12 NOTE — Telephone Encounter (Signed)
Pt recuperating from Pericardial window  Surgery. She is staying  at her dtrs house until late next week . That is why she did not get message about appt for this week. I sent ONC tx request to r/s.

## 2012-09-12 NOTE — Telephone Encounter (Signed)
Pt recuperating from Pericardial window  Surgery. She is staying  at her dtrs house until late next week . That is why she did not get message about appt for this week. Note to Dr Arbutus Ped.

## 2012-09-15 ENCOUNTER — Other Ambulatory Visit: Payer: Self-pay | Admitting: *Deleted

## 2012-09-15 ENCOUNTER — Ambulatory Visit
Admission: RE | Admit: 2012-09-15 | Discharge: 2012-09-15 | Disposition: A | Discharge status: Home / Self Care | Payer: Medicaid Other | Source: Ambulatory Visit | Attending: Radiation Oncology | Admitting: Radiation Oncology

## 2012-09-15 ENCOUNTER — Ambulatory Visit: Payer: Self-pay

## 2012-09-15 ENCOUNTER — Encounter: Payer: Self-pay | Admitting: Radiation Oncology

## 2012-09-15 ENCOUNTER — Telehealth: Payer: Self-pay | Admitting: Internal Medicine

## 2012-09-15 VITALS — BP 109/76 | HR 108 | Temp 97.4°F | Resp 16 | Wt 100.0 lb

## 2012-09-15 DIAGNOSIS — R918 Other nonspecific abnormal finding of lung field: Secondary | ICD-10-CM

## 2012-09-15 DIAGNOSIS — C7931 Secondary malignant neoplasm of brain: Secondary | ICD-10-CM

## 2012-09-15 NOTE — Progress Notes (Signed)
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 She received 66 Gy in 33 fractions to the primary tumor in the right upper lobe of the lung 12/24/2011-02/08/2012  3. Stereotactic radiosurgery to her new Lt Frontal 5 mm and Rt Parietal 18 mm mets 04/14/2012 4. SRS to a 4.8 mm right parietal brain metastasis to a prescription dose of 20 Gy.  Interval Since Last Radiation:  1  months  Narrative:  The patient returns today for routine follow-up.  She had pericardial window for effusion in late June with negative cytology and pericardial biopsy.  She does have two new liver lesions suspicious for metastases.                              ALLERGIES:  is allergic to carboplatin and levaquin.  Meds: Current Outpatient Prescriptions  Medication Sig Dispense Refill  . albuterol (PROVENTIL) (5 MG/ML) 0.5% nebulizer solution Take 0.5 mLs (2.5 mg total) by nebulization every 4 (four) hours as needed for wheezing or shortness of breath.  20 mL  5  . folic acid (FOLVITE) 1 MG tablet Take 1 tablet (1 mg total) by mouth daily.  30 tablet  5  . ipratropium (ATROVENT) 0.02 % nebulizer solution Take 2.5 mLs (0.5 mg total) by nebulization every 4 (four) hours as needed (pt mixes with albuterol).  75 mL  5  . oxyCODONE-acetaminophen (PERCOCET/ROXICET) 5-325 MG per tablet Take 1 tablet by mouth every 4 (four) hours as needed for pain.  60 tablet  0  . oxyCODONE-acetaminophen (PERCOCET/ROXICET) 5-325 MG per tablet Take 1-2 tablets by mouth every 4 (four) hours as needed.  30 tablet  0  . dexamethasone (DECADRON) 4 MG tablet Take 4 mg by mouth 2 (two) times daily.      .  sucralfate (CARAFATE) 1 G tablet Take 1 g by mouth daily.       No current facility-administered medications for this encounter.    Physical Findings: The patient is in no acute distress. Patient is alert and oriented.  weight is 100 lb (45.36 kg). Her oral temperature is 97.4 F (36.3 C). Her blood pressure is 109/76 and her pulse is 108. Her respiration is 16 and oxygen saturation is 100%. . On the right posterior neck she has a cutaneous nodule measuring 1.2 cm which is rounded, erythematous, and minimaly tender. No other significant changes.  Lab Findings: Lab Results  Component Value Date   WBC 13.3* 08/30/2012   HGB 8.8* 08/30/2012   HCT 28.8* 08/30/2012   MCV 97.3 08/30/2012   PLT 369 08/30/2012    @LASTCHEM @  Radiographic Findings: Dg Chest 2 View  09/01/2012   *RADIOLOGY REPORT*  Clinical Data: Postoperative chest x-ray status post pericardial window, cough  CHEST - 2 VIEW  Comparison: Prior chest x-ray 08/30/2012; CT scan 08/27/2012  Findings: The right IJ central venous catheter has been removed. The pericardial drain has also been removed.  Stable cardiopericardial silhouette.  There may be a small amount of pneumomediastinum.  Stable moderate left and small right pleural effusions with associated bibasilar atelectasis.  Right apical mass with adjacent postobstructive changes has not significantly changed in appearance compared to the recent prior radiographs. Incompletely imaged cervical spine stabilization hardware at C7-T1. No acute osseous abnormality.  IMPRESSION:  1.  Interval removal of support apparatus as above without evidence of complication. 2.  Decreasing pneumomediastinum. 3.  Stable moderate left and small right pleural effusions with associated bibasilar atelectasis. 4.  Stable right apical mass and associated postobstructive changes.   Original Report Authenticated By: Malachy Moan, M.D.   Dg Chest 2 View  08/27/2012   *RADIOLOGY REPORT*  Clinical Data:  62 year old female with chest pain.  Abnormal chest CTA. Right lung cancer.  CHEST - 2 VIEW  Comparison: Chest CTA 0459 hours the same day and earlier.  Findings: Stable lung volumes.  Bilateral pleural effusions. Stable heterogeneous opacification in the right apex.  Stable cardiac size and mediastinal contours.  No pneumothorax or pulmonary edema.  Overall stable ventilation.  IMPRESSION: Stable ventilation.  Bilateral pleural effusions and mixed density in the right apex.  See chest CTA report from earlier today.   Original Report Authenticated By: Erskine Speed, M.D.   Ct Chest W Contrast  08/22/2012   *RADIOLOGY REPORT*  Clinical Data:  Lung cancer diagnosed 11/2011, XRT complete, chemotherapy in progress, shortness of breath  CT CHEST, ABDOMEN AND PELVIS WITH CONTRAST  Technique:  Multidetector CT imaging of the chest, abdomen and pelvis was performed following the standard protocol during bolus administration of intravenous contrast.  Contrast: OMNIPAQUE IOHEXOL 300 MG/ML  SOLN  Comparison:  05/23/2012  CT CHEST  Findings:  6.1 x 6.1 cm posterior right upper lobe mass, grossly unchanged.  Associated medial right upper lobe consolidation (series 5/image 16), likely reflecting radiation changes.  4 mm subpleural nodule in the right lower lobe (series 5/image 29), new.  Underlying moderate to severe centrilobular emphysematous changes.  Small right and trace left pleural effusions.  No pneumothorax.  Heart is normal in size.  Moderate pericardial effusion, new.  No suspicious mediastinal, hilar, or axillary lymphadenopathy.  Mild degenerative changes of the thoracic spine.  IMPRESSION: 6.1 cm posterior right upper lobe mass, corresponding to known lung cancer, grossly unchanged.  Associated medial right upper lobe consolidation, likely reflecting radiation changes.  4 mm subpleural nodule in the right lower lobe, new, worrisome for metastasis.  Attention on follow-up suggested.  Moderate pericardial  effusion, new.  Correlate for tamponade physiology.  Small right and trace left pleural effusions.  CT ABDOMEN AND PELVIS  Findings:  11 x 9 mm hypoenhancing lesion in the medial segment left hepatic lobe (series 2/image 64), new.  7 x 7 mm hypoenhancing lesion in the posterior segment right hepatic lobe (series 2/image 69), previously 3 x 4 mm.  Given the interval change, both lesions are worrisome for hepatic metastases.  Spleen, pancreas, and adrenal glands are within normal limits.  Cholelithiasis.  Enlarging cystic lesion along the gallbladder fundus, likely reflecting a pseudodiverticulum (coronal image 36), less likely adenomyomatosis.  Kidneys are within normal limits.  No hydronephrosis.  No evidence of bowel obstruction.  Atherosclerotic calcifications of the abdominal aorta and branch vessels.  No abdominopelvic ascites.  No suspicious abdominopelvic lymphadenopathy.  Status post hysterectomy.  No adnexal masses.  Bladder is underdistended.  Small left and tiny right back fat-containing inguinal hernias.  Mild degenerative changes of the lumbar spine.  IMPRESSION: Two hepatic lesions measuring up to 11 mm, as described above, worrisome for metastases.  Consider MRI abdomen with/without contrast for further  evaluation.  Alternatively, this can be evaluated at follow-up.  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   Original Report Authenticated By: Charline Bills, M.D.   Ct Angio Chest Pe W/cm &/or Wo Cm  08/27/2012   *RADIOLOGY REPORT*  Clinical Data: Chest pain, shortness of breath.History of right lung cancer.  CT ANGIOGRAPHY CHEST  Technique:  Multidetector CT imaging of the chest using the standard protocol during bolus administration of intravenous contrast. Multiplanar reconstructed images including MIPs were obtained and reviewed to evaluate the vascular anatomy.  Contrast: 80mL OMNIPAQUE IOHEXOL 350 MG/ML SOLN  Comparison: Chest CT 08/22/2012   Findings: Again noted is the large right apical mass, approximately 6 cm in diameter.  This is unchanged since prior study.  Adjacent airspace disease may be related to postradiation changes or postobstructive process, unchanged as well.  Small bilateral pleural effusions, slight increased on the left since prior study. Large pericardial effusion, increased.  Heart is normal size.  No visible mediastinal, hilar or axillary adenopathy.  No filling defects in the pulmonary arteries to suggest pulmonary emboli. Moderate COPD changes.  No confluent opacities on the left.  Imaging into the upper abdomen shows no acute findings.  No acute bony abnormality.  IMPRESSION: Stable right apical mass with adjacent airspace disease.  Small bilateral pleural effusions, slightly increased since prior study, particularly on the left.  Large pericardial effusion, increased.  No evidence of pulmonary embolus.   Original Report Authenticated By: Charlett Nose, M.D.   Ct Abdomen Pelvis W Contrast  08/22/2012   *RADIOLOGY REPORT*  Clinical Data:  Lung cancer diagnosed 11/2011, XRT complete, chemotherapy in progress, shortness of breath  CT CHEST, ABDOMEN AND PELVIS WITH CONTRAST  Technique:  Multidetector CT imaging of the chest, abdomen and pelvis was performed following the standard protocol during bolus administration of intravenous contrast.  Contrast: OMNIPAQUE IOHEXOL 300 MG/ML  SOLN  Comparison:  05/23/2012  CT CHEST  Findings:  6.1 x 6.1 cm posterior right upper lobe mass, grossly unchanged.  Associated medial right upper lobe consolidation (series 5/image 16), likely reflecting radiation changes.  4 mm subpleural nodule in the right lower lobe (series 5/image 29), new.  Underlying moderate to severe centrilobular emphysematous changes.  Small right and trace left pleural effusions.  No pneumothorax.  Heart is normal in size.  Moderate pericardial effusion, new.  No suspicious mediastinal, hilar, or axillary  lymphadenopathy.  Mild degenerative changes of the thoracic spine.  IMPRESSION: 6.1 cm posterior right upper lobe mass, corresponding to known lung cancer, grossly unchanged.  Associated medial right upper lobe consolidation, likely reflecting radiation changes.  4 mm subpleural nodule in the right lower lobe, new, worrisome for metastasis.  Attention on follow-up suggested.  Moderate pericardial effusion, new.  Correlate for tamponade physiology.  Small right and trace left pleural effusions.  CT ABDOMEN AND PELVIS  Findings:  11 x 9 mm hypoenhancing lesion in the medial segment left hepatic lobe (series 2/image 64), new.  7 x 7 mm hypoenhancing lesion in the posterior segment right hepatic lobe (series 2/image 69), previously 3 x 4 mm.  Given the interval change, both lesions are worrisome for hepatic metastases.  Spleen, pancreas, and adrenal glands are within normal limits.  Cholelithiasis.  Enlarging cystic lesion along the gallbladder fundus, likely reflecting a pseudodiverticulum (coronal image 36), less likely adenomyomatosis.  Kidneys are within normal limits.  No hydronephrosis.  No evidence of bowel obstruction.  Atherosclerotic calcifications of the abdominal aorta and branch vessels.  No abdominopelvic ascites.  No suspicious abdominopelvic lymphadenopathy.  Status post hysterectomy.  No adnexal masses.  Bladder is underdistended.  Small left and tiny right back fat-containing inguinal hernias.  Mild degenerative changes of the lumbar spine.  IMPRESSION: Two hepatic lesions measuring up to 11 mm, as described above, worrisome for metastases.  Consider MRI abdomen with/without contrast for further evaluation.  Alternatively, this can be evaluated at follow-up.  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   Original Report Authenticated By: Charline Bills, M.D.   Dg Chest Port 1 View  08/30/2012   *RADIOLOGY REPORT*  Clinical Data: Chest tube  PORTABLE CHEST  - 1 VIEW  Comparison: 08/29/2012  Findings: Stable left apical chest tube.  Stable pericardial tube. Stable left pleural effusion and basilar opacity.  Stable right apical opacity with volume loss.  Normal heart size.  IMPRESSION: Stable.  No pneumothorax.   Original Report Authenticated By: Jolaine Click, M.D.   Dg Chest Port 1 View  08/29/2012   *RADIOLOGY REPORT*  Clinical Data: Chest tubes.  PORTABLE CHEST - 1 VIEW  Comparison: 08/28/2012  Findings: Left chest tube remains in place.  No visible pneumothorax currently.  Continued right apical density, unchanged. Small bilateral pleural effusions.  Bibasilar atelectasis.  Heart is normal size.  IMPRESSION: Slight increase in bibasilar atelectasis and small effusions.  No visible pneumothorax currently.   Original Report Authenticated By: Charlett Nose, M.D.   Dg Chest Portable 1 View  08/28/2012   *RADIOLOGY REPORT*  Clinical Data: Postop pericardial window, chest tube, metastatic lung cancer post radiation therapy, COPD  PORTABLE CHEST - 1 VIEW  Comparison: Portable exam 0937 hours compared to 08/28/2012  Findings: Right jugular central venous catheter with tip projecting over SVC. Enlargement of cardiac silhouette. Mediastinal drain and left thoracostomy tube newly identified. Changes of COPD with chronic opacification of the right upper lobe due to known mass and right upper lobe atelectasis. Atelectasis versus infiltrate left lower lobe. Minimal left pleural effusion. Tiny left apex pneumothorax. Bones demineralized with evidence of prior cervical spine fusion.  IMPRESSION: Tiny left apex pneumothorax in patient with left thoracostomy tube. Enlargement of cardiac silhouette. Atelectasis versus consolidation left lower lobe with minimal left pleural effusion. Chronic opacification and volume loss of right upper lobe due to known mass and atelectasis.   Original Report Authenticated By: Ulyses Southward, M.D.   Dg Chest Port 1 View  08/28/2012   *RADIOLOGY  REPORT*  Clinical Data: Shortness of breath, congestion.  PORTABLE CHEST - 1 VIEW  Comparison: 08/27/2012  Findings: Right apical mass/airspace density again noted, unchanged.  Small left pleural effusion, slightly enlarged.  Mild cardiomegaly.  IMPRESSION: Slight enlargement of left pleural effusion.  Otherwise no change.   Original Report Authenticated By: Charlett Nose, M.D.   Dg Chest Portable 1 View  08/27/2012   *RADIOLOGY REPORT*  Clinical Data: Chest pain, shortness of breath.  PORTABLE CHEST - 1 VIEW  Comparison: Chest CT 08/22/2012.  Findings: Large right apical mass again noted, grossly unchanged. Underlying COPD.  Cardiomegaly.  Small bilateral effusions.  No confluent opacity on the left.  No acute bony abnormality.  IMPRESSION: Stable large right apical mass.  Small bilateral effusions.  COPD.  Cardiomegaly.   Original Report Authenticated By: Charlett Nose, M.D.    Impression:  The patient tolerated her brain SRS.  Plan:  Repeat brain MRI in 6 weeks then follow-up.  She will discuss her neck nodule with Dr. Maren Beach.  _____________________________________  Artist Pais. Kathrynn Running,  M.D.

## 2012-09-15 NOTE — Progress Notes (Signed)
Reports a dry cough. Reports shortness of breath with exertion. Portable oxygen therapy 2 liter via nasal cannula noted. Reports legs are weak only allowing her to walk short distances. Reports pain under left breast that radiates to her left neck that began on Friday. Patient rates this pain 7 on a scale of 0-10. Reports that during the end of June she had fluid removed from around her heart. Denies nausea, vomiting, headache, dizziness, diarrhea, diplopia or ringing in the ears.

## 2012-09-15 NOTE — Telephone Encounter (Signed)
lvm for pt regarding to appt on 7.21.14.[name]

## 2012-09-17 DIAGNOSIS — Z0279 Encounter for issue of other medical certificate: Secondary | ICD-10-CM

## 2012-09-22 ENCOUNTER — Ambulatory Visit (HOSPITAL_BASED_OUTPATIENT_CLINIC_OR_DEPARTMENT_OTHER): Payer: Medicaid Other | Admitting: Internal Medicine

## 2012-09-22 ENCOUNTER — Telehealth: Payer: Self-pay | Admitting: Internal Medicine

## 2012-09-22 ENCOUNTER — Encounter: Payer: Self-pay | Admitting: Internal Medicine

## 2012-09-22 ENCOUNTER — Other Ambulatory Visit (HOSPITAL_BASED_OUTPATIENT_CLINIC_OR_DEPARTMENT_OTHER): Payer: Medicaid Other | Admitting: Lab

## 2012-09-22 ENCOUNTER — Other Ambulatory Visit: Payer: Self-pay | Admitting: *Deleted

## 2012-09-22 ENCOUNTER — Encounter: Payer: Medicaid Other | Admitting: *Deleted

## 2012-09-22 DIAGNOSIS — C3490 Malignant neoplasm of unspecified part of unspecified bronchus or lung: Secondary | ICD-10-CM

## 2012-09-22 DIAGNOSIS — I313 Pericardial effusion (noninflammatory): Secondary | ICD-10-CM

## 2012-09-22 DIAGNOSIS — J90 Pleural effusion, not elsewhere classified: Secondary | ICD-10-CM

## 2012-09-22 DIAGNOSIS — R918 Other nonspecific abnormal finding of lung field: Secondary | ICD-10-CM

## 2012-09-22 DIAGNOSIS — I319 Disease of pericardium, unspecified: Secondary | ICD-10-CM

## 2012-09-22 DIAGNOSIS — C7949 Secondary malignant neoplasm of other parts of nervous system: Secondary | ICD-10-CM

## 2012-09-22 DIAGNOSIS — C7931 Secondary malignant neoplasm of brain: Secondary | ICD-10-CM

## 2012-09-22 DIAGNOSIS — R222 Localized swelling, mass and lump, trunk: Secondary | ICD-10-CM

## 2012-09-22 LAB — CBC WITH DIFFERENTIAL/PLATELET
BASO%: 0.8 % (ref 0.0–2.0)
Basophils Absolute: 0.1 10*3/uL (ref 0.0–0.1)
EOS%: 2.6 % (ref 0.0–7.0)
Eosinophils Absolute: 0.3 10*3/uL (ref 0.0–0.5)
HCT: 29.8 % — ABNORMAL LOW (ref 34.8–46.6)
HGB: 9.7 g/dL — ABNORMAL LOW (ref 11.6–15.9)
LYMPH%: 4.3 % — ABNORMAL LOW (ref 14.0–49.7)
MCH: 30.2 pg (ref 25.1–34.0)
MCHC: 32.6 g/dL (ref 31.5–36.0)
MCV: 92.6 fL (ref 79.5–101.0)
MONO#: 0.8 10*3/uL (ref 0.1–0.9)
MONO%: 8.3 % (ref 0.0–14.0)
NEUT#: 8.5 10*3/uL — ABNORMAL HIGH (ref 1.5–6.5)
NEUT%: 84 % — ABNORMAL HIGH (ref 38.4–76.8)
Platelets: 415 10*3/uL — ABNORMAL HIGH (ref 145–400)
RBC: 3.22 10*6/uL — ABNORMAL LOW (ref 3.70–5.45)
RDW: 17.5 % — ABNORMAL HIGH (ref 11.2–14.5)
WBC: 10.1 10*3/uL (ref 3.9–10.3)
lymph#: 0.4 10*3/uL — ABNORMAL LOW (ref 0.9–3.3)

## 2012-09-22 LAB — COMPREHENSIVE METABOLIC PANEL (CC13)
ALT: 6 U/L (ref 0–55)
Albumin: 3 g/dL — ABNORMAL LOW (ref 3.5–5.0)
CO2: 28 mEq/L (ref 22–29)
Calcium: 10.5 mg/dL — ABNORMAL HIGH (ref 8.4–10.4)
Chloride: 100 mEq/L (ref 98–109)
Creatinine: 0.8 mg/dL (ref 0.6–1.1)

## 2012-09-22 LAB — RESEARCH LABS

## 2012-09-22 MED ORDER — ERLOTINIB HCL 150 MG PO TABS: 150.0000 mg | ORAL_TABLET | Freq: Every day | ORAL | Status: DC

## 2012-09-22 NOTE — Progress Notes (Signed)
09/22/2012 Patient returns to clinic today for first follow-up visit with [name], following recent hospitalization which occurred two days after study enrollment. Baseline research blood samples were collected today (first scheduled lab draw in clinic, since study enrollment). The patient completed the Patient Reported Outcomes (PROs) independently, including the EQ-5D-3L followed by the lung cancer symptom scale (LCSS), prior to the MD appointment. Following completion, patient identifiers were added to the EQ-5D-3L questionnaire.   The patient reports that she began smoking at the age of 33, at the rate of one-half pack per week. Approximately one year ago, at the age of 66, the patient began smoking one-half pack per day, and she continues to smoke that amount as of today.  Thanked patient for participating in the research study. Plans made to follow up with patient at next office visit, for completion of PROs.  Cindy S. Clelia Croft BSN, RN, CCRP 09/22/2012 11:49 AM

## 2012-09-22 NOTE — Progress Notes (Signed)
[facility] [phone]   [phone]  OFFICE PROGRESS NOTE  [name], MD [address] [place] [address]  DIAGNOSIS: Metastatic non-small cell lung cancer, adenocarcinoma with negative EGFR mutation and negative ALK gene translocation diagnosed in September of 2013 with metastatic single brain lesion.   PRIOR THERAPY:  1) Status post stereotactic radiosurgery to the single brain lesion under the care Dr. Kathrynn Running.  2) Concurrent chemoradiation with chemotherapy in the form of weekly carboplatin for an AUC of 2 and paclitaxel at 45 mg per meter squared concurrent with radiation therapy under the care Dr. Kathrynn Running with partial response in her disease.  3) Systemic chemotherapy with carboplatin for AUC of 5 and Alimta 500 mg/M2 every 3 weeks. From cycle 3 forward she is receiving carboplatin for an AUC of 4 and Alimta at 375 mg per meter squared due to thrombocytopenia. Status post a total of 6 cycles with evidence for disease progression. 4) Subxiphoid pericardial window for drainage of pericardial effusion under the care of Dr. Donata Clay on 08/28/2012 and the final pathology showed no malignant cells in the pleural fluid.   CURRENT THERAPY: Tarceva 150 mg by mouth daily expected to start in the next few days.   INTERVAL HISTORY: Melanie Liu 62 y.o. female returns to the clinic today for followup visit accompanied by a family member. The patient is feeling fine today with no specific complaints except for mild fatigue and shortness breath with exertion. She has significant improvement in her breathing after she underwent pericardial window for drainage of the large pericardial effusion under the care of Dr. Donata Clay. She is recovering well from her surgery. She denied having any significant weight loss or night sweats. The patient denied having any chest pain but continues to have shortness of breath with exertion with no cough or  hemoptysis. She is here today for evaluation and discussion of her treatment options.  MEDICAL HISTORY: Past Medical History  Diagnosis Date  . Nodule of right lung     right upper lobe  . Anemia   . Rectovaginal fistula   . Right frontal lobe lesion 12/13/11    CT head  . History of radiation therapy 12/24/11-02/08/12    rul 66Gy/40fxs  . Dysphagia   . History of radiation therapy 12/27/11    SRS rt ant frontal 20gy  . Lung cancer 11/23/11    biopsy-right  . Brain metastases   . Chest pain at rest 08/26/2012  . COPD (chronic obstructive pulmonary disease)   . Shortness of breath     ALLERGIES:  is allergic to carboplatin and levaquin.  MEDICATIONS:  Current Outpatient Prescriptions  Medication Sig Dispense Refill  . albuterol (PROVENTIL) (5 MG/ML) 0.5% nebulizer solution Take 0.5 mLs (2.5 mg total) by nebulization every 4 (four) hours as needed for wheezing or shortness of breath.  20 mL  5  . dexamethasone (DECADRON) 4 MG tablet Take 4 mg by mouth 2 (two) times daily.      . folic acid (FOLVITE) 1 MG tablet Take 1 tablet (1 mg total) by mouth daily.  30 tablet  5  . ipratropium (ATROVENT) 0.02 % nebulizer solution Take 2.5 mLs (0.5 mg total) by nebulization every 4 (four) hours as needed (pt mixes with albuterol).  75 mL  5  . oxyCODONE-acetaminophen (PERCOCET/ROXICET) 5-325 MG per tablet Take 1 tablet by mouth every 4 (four) hours as needed for pain.  60 tablet  0  .  oxyCODONE-acetaminophen (PERCOCET/ROXICET) 5-325 MG per tablet Take 1-2 tablets by mouth every 4 (four) hours as needed.  30 tablet  0  . sucralfate (CARAFATE) 1 G tablet Take 1 g by mouth daily.       No current facility-administered medications for this visit.    SURGICAL HISTORY:  Past Surgical History  Procedure Laterality Date  . Partial hysterectomy    . Hemmorhoid surgery    . Neck surgery    . Retinal detachment surgery    . Esophagogastroduodenoscopy N/A 05/01/2012    Procedure:  ESOPHAGOGASTRODUODENOSCOPY (EGD);  Surgeon: Shirley Friar, MD;  Location: [facility];  Service: Endoscopy;  Laterality: N/A;  . Subxyphoid pericardial window N/A 08/28/2012    Procedure: SUBXYPHOID PERICARDIAL WINDOW;  Surgeon: Kerin Perna, MD;  Location: [facility] OR;  Service: Thoracic;  Laterality: N/A;  . Tee without cardioversion  08/28/2012    Procedure: TRANSESOPHAGEAL ECHOCARDIOGRAM (TEE);  Surgeon: Kerin Perna, MD;  Location: [facility] OR;  Service: Thoracic;;    REVIEW OF SYSTEMS:  A comprehensive review of systems was negative except for: Constitutional: positive for fatigue Respiratory: positive for dyspnea on exertion   PHYSICAL EXAMINATION: General appearance: alert, cooperative, fatigued and no distress Head: Normocephalic, without obvious abnormality, atraumatic Neck: no adenopathy Lymph nodes: Cervical, supraclavicular, and axillary nodes normal. Resp: clear to auscultation bilaterally Cardio: regular rate and rhythm, S1, S2 normal, no murmur, click, rub or gallop GI: soft, non-tender; bowel sounds normal; no masses,  no organomegaly Extremities: extremities normal, atraumatic, no cyanosis or edema Neurologic: Alert and oriented X 3, normal strength and tone. Normal symmetric reflexes. Normal coordination and gait  ECOG PERFORMANCE STATUS: 1 - Symptomatic but completely ambulatory  Blood pressure 100/65, pulse 121, temperature 98.4 F (36.9 C), temperature source Oral, resp. rate 18, height 5\' 2"  (1.575 m), weight 101 lb 1.6 oz (45.859 kg).  LABORATORY DATA: Lab Results  Component Value Date   WBC 13.3* 08/30/2012   HGB 8.8* 08/30/2012   HCT 28.8* 08/30/2012   MCV 97.3 08/30/2012   PLT 369 08/30/2012      Chemistry      Component Value Date/Time   NA 140 09/22/2012 1036   NA 137 08/30/2012 0355   K 3.9 09/22/2012 1036   K 3.7 08/30/2012 0355   CL 99 08/30/2012 0355   CL 102 08/18/2012 0938   CO2 28 09/22/2012 1036   CO2 31 08/30/2012 0355   BUN 15.5 09/22/2012 1036    BUN 10 08/30/2012 0355   CREATININE 0.8 09/22/2012 1036   CREATININE 0.63 08/30/2012 0355      Component Value Date/Time   CALCIUM 10.5* 09/22/2012 1036   CALCIUM 8.9 08/30/2012 0355   ALKPHOS 95 09/22/2012 1036   ALKPHOS 97 08/30/2012 0355   AST 11 09/22/2012 1036   AST 10 08/30/2012 0355   ALT 6 09/22/2012 1036   ALT <5 08/30/2012 0355   BILITOT 0.22 09/22/2012 1036   BILITOT 0.1* 08/30/2012 0355       RADIOGRAPHIC STUDIES: Dg Chest 2 View  09/01/2012   *RADIOLOGY REPORT*  Clinical Data: Postoperative chest x-ray status post pericardial window, cough  CHEST - 2 VIEW  Comparison: Prior chest x-ray 08/30/2012; CT scan 08/27/2012  Findings: The right IJ central venous catheter has been removed. The pericardial drain has also been removed.  Stable cardiopericardial silhouette.  There may be a small amount of pneumomediastinum.  Stable moderate left and small right pleural effusions with associated bibasilar atelectasis.  Right apical mass  with adjacent postobstructive changes has not significantly changed in appearance compared to the recent prior radiographs. Incompletely imaged cervical spine stabilization hardware at C7-T1. No acute osseous abnormality.  IMPRESSION:  1.  Interval removal of support apparatus as above without evidence of complication. 2.  Decreasing pneumomediastinum. 3.  Stable moderate left and small right pleural effusions with associated bibasilar atelectasis. 4.  Stable right apical mass and associated postobstructive changes.   Original Report Authenticated By: Malachy Moan, M.D.   Dg Chest 2 View  08/27/2012   *RADIOLOGY REPORT*  Clinical Data: 62 year old female with chest pain.  Abnormal chest CTA. Right lung cancer.  CHEST - 2 VIEW  Comparison: Chest CTA 0459 hours the same day and earlier.  Findings: Stable lung volumes.  Bilateral pleural effusions. Stable heterogeneous opacification in the right apex.  Stable cardiac size and mediastinal contours.  No pneumothorax or  pulmonary edema.  Overall stable ventilation.  IMPRESSION: Stable ventilation.  Bilateral pleural effusions and mixed density in the right apex.  See chest CTA report from earlier today.   Original Report Authenticated By: Erskine Speed, M.D.   Ct Angio Chest Pe W/cm &/or Wo Cm  08/27/2012   *RADIOLOGY REPORT*  Clinical Data: Chest pain, shortness of breath.History of right lung cancer.  CT ANGIOGRAPHY CHEST  Technique:  Multidetector CT imaging of the chest using the standard protocol during bolus administration of intravenous contrast. Multiplanar reconstructed images including MIPs were obtained and reviewed to evaluate the vascular anatomy.  Contrast: 80mL OMNIPAQUE IOHEXOL 350 MG/ML SOLN  Comparison: Chest CT 08/22/2012  Findings: Again noted is the large right apical mass, approximately 6 cm in diameter.  This is unchanged since prior study.  Adjacent airspace disease may be related to postradiation changes or postobstructive process, unchanged as well.  Small bilateral pleural effusions, slight increased on the left since prior study. Large pericardial effusion, increased.  Heart is normal size.  No visible mediastinal, hilar or axillary adenopathy.  No filling defects in the pulmonary arteries to suggest pulmonary emboli. Moderate COPD changes.  No confluent opacities on the left.  Imaging into the upper abdomen shows no acute findings.  No acute bony abnormality.  IMPRESSION: Stable right apical mass with adjacent airspace disease.  Small bilateral pleural effusions, slightly increased since prior study, particularly on the left.  Large pericardial effusion, increased.  No evidence of pulmonary embolus.   Original Report Authenticated By: Charlett Nose, M.D.   Dg Chest Port 1 View  08/30/2012   *RADIOLOGY REPORT*  Clinical Data: Chest tube  PORTABLE CHEST - 1 VIEW  Comparison: 08/29/2012  Findings: Stable left apical chest tube.  Stable pericardial tube. Stable left pleural effusion and basilar opacity.   Stable right apical opacity with volume loss.  Normal heart size.  IMPRESSION: Stable.  No pneumothorax.   Original Report Authenticated By: Jolaine Click, M.D.   Dg Chest Port 1 View  08/29/2012   *RADIOLOGY REPORT*  Clinical Data: Chest tubes.  PORTABLE CHEST - 1 VIEW  Comparison: 08/28/2012  Findings: Left chest tube remains in place.  No visible pneumothorax currently.  Continued right apical density, unchanged. Small bilateral pleural effusions.  Bibasilar atelectasis.  Heart is normal size.  IMPRESSION: Slight increase in bibasilar atelectasis and small effusions.  No visible pneumothorax currently.   Original Report Authenticated By: Charlett Nose, M.D.   Dg Chest Portable 1 View  08/28/2012   *RADIOLOGY REPORT*  Clinical Data: Postop pericardial window, chest tube, metastatic lung cancer post radiation therapy,  COPD  PORTABLE CHEST - 1 VIEW  Comparison: Portable exam 0937 hours compared to 08/28/2012  Findings: Right jugular central venous catheter with tip projecting over SVC. Enlargement of cardiac silhouette. Mediastinal drain and left thoracostomy tube newly identified. Changes of COPD with chronic opacification of the right upper lobe due to known mass and right upper lobe atelectasis. Atelectasis versus infiltrate left lower lobe. Minimal left pleural effusion. Tiny left apex pneumothorax. Bones demineralized with evidence of prior cervical spine fusion.  IMPRESSION: Tiny left apex pneumothorax in patient with left thoracostomy tube. Enlargement of cardiac silhouette. Atelectasis versus consolidation left lower lobe with minimal left pleural effusion. Chronic opacification and volume loss of right upper lobe due to known mass and atelectasis.   Original Report Authenticated By: Ulyses Southward, M.D.   Dg Chest Port 1 View  08/28/2012   *RADIOLOGY REPORT*  Clinical Data: Shortness of breath, congestion.  PORTABLE CHEST - 1 VIEW  Comparison: 08/27/2012  Findings: Right apical mass/airspace density again  noted, unchanged.  Small left pleural effusion, slightly enlarged.  Mild cardiomegaly.  IMPRESSION: Slight enlargement of left pleural effusion.  Otherwise no change.   Original Report Authenticated By: Charlett Nose, M.D.   Dg Chest Portable 1 View  08/27/2012   *RADIOLOGY REPORT*  Clinical Data: Chest pain, shortness of breath.  PORTABLE CHEST - 1 VIEW  Comparison: Chest CT 08/22/2012.  Findings: Large right apical mass again noted, grossly unchanged. Underlying COPD.  Cardiomegaly.  Small bilateral effusions.  No confluent opacity on the left.  No acute bony abnormality.  IMPRESSION: Stable large right apical mass.  Small bilateral effusions.  COPD.  Cardiomegaly.   Original Report Authenticated By: Charlett Nose, M.D.    ASSESSMENT AND PLAN: This is a very pleasant 62 years old white female with metastatic non-small cell lung cancer, adenocarcinoma status post 6 cycles of systemic chemotherapy was carboplatin and Alimta was evidence for disease progression after cycle #6. I have a lengthy discussion with the patient today about her current disease status and treatment options. I offered the patient the options of treatment including oral Tarceva versus systemic chemotherapy with docetaxel. After discussion of the 2 options the patient would like to proceed with oral Tarceva at this point. She will receive Tarceva 150 mg by mouth daily. I will send her a prescription to is doing outpatient pharmacy for refill.  I discussed with the patient adverse effect of Tarceva including but not limited to skin rash, diarrhea, interstitial lung disease, liver or renal dysfunction. The patient signed consent for the treatment and she would like to proceed with treatment as planned. She was also given handout and the starter kit for Tarceva. She would come back for followup visit in 2 weeks for evaluation and management any adverse effect of her therapy. The patient was advised to call immediately if she has any  concerning symptoms in the interval. All questions were answered. The patient knows to call the clinic with any problems, questions or concerns. We can certainly see the patient much sooner if necessary.  I spent 15 minutes counseling the patient face to face. The total time spent in the appointment was 25 minutes.

## 2012-09-22 NOTE — Patient Instructions (Signed)
We discussed treatment options today including oral Tarceva. Followup visit in 2 weeks

## 2012-09-22 NOTE — Telephone Encounter (Signed)
gv and printed appt sched and avs for pt  °

## 2012-09-24 ENCOUNTER — Other Ambulatory Visit: Payer: Self-pay | Admitting: *Deleted

## 2012-09-24 ENCOUNTER — Ambulatory Visit
Admission: RE | Admit: 2012-09-24 | Discharge: 2012-09-24 | Disposition: A | Discharge status: Home / Self Care | Payer: Medicaid Other | Source: Ambulatory Visit | Attending: Cardiothoracic Surgery | Admitting: Cardiothoracic Surgery

## 2012-09-24 ENCOUNTER — Ambulatory Visit (INDEPENDENT_AMBULATORY_CARE_PROVIDER_SITE_OTHER): Payer: Self-pay | Admitting: Cardiothoracic Surgery

## 2012-09-24 ENCOUNTER — Encounter (HOSPITAL_COMMUNITY): Payer: Self-pay | Admitting: Pharmacy Technician

## 2012-09-24 ENCOUNTER — Encounter: Payer: Self-pay | Admitting: Cardiothoracic Surgery

## 2012-09-24 VITALS — BP 88/57 | HR 118 | Resp 16 | Ht 62.0 in | Wt 101.0 lb

## 2012-09-24 DIAGNOSIS — R221 Localized swelling, mass and lump, neck: Secondary | ICD-10-CM

## 2012-09-24 DIAGNOSIS — I313 Pericardial effusion (noninflammatory): Secondary | ICD-10-CM

## 2012-09-24 DIAGNOSIS — J90 Pleural effusion, not elsewhere classified: Secondary | ICD-10-CM

## 2012-09-24 DIAGNOSIS — I319 Disease of pericardium, unspecified: Secondary | ICD-10-CM

## 2012-09-24 DIAGNOSIS — Z09 Encounter for follow-up examination after completed treatment for conditions other than malignant neoplasm: Secondary | ICD-10-CM

## 2012-09-24 DIAGNOSIS — C3490 Malignant neoplasm of unspecified part of unspecified bronchus or lung: Secondary | ICD-10-CM

## 2012-09-24 NOTE — Progress Notes (Signed)
PCP is [name], MD Referring Provider is [name], MD  Chief Complaint  Patient presents with  . Routine Post Op    3 wk f/u s/p subxyphoid window 08/28/12    HPI: One month followup after subxiphoid pericardial window for pericardial effusion with tamponade. History of advanced stage carcinoma right lung -on-oral chemotherapy with Tarceva. Feels much better after drainage of pericardial fluid. Both cytology and tissue pathology were negative for malignancy and the pericarditis is probably from radiation changes and/or chemotherapy.   no symptoms orthopnea. Chest x-ray shows no enlargement of cardiac silhouette.   Patient does have a enlarged tender mass on the posterior right neck. This may be a cyst versus metastatic nodule.   Past Medical History  Diagnosis Date  . Nodule of right lung     right upper lobe  . Anemia   . Rectovaginal fistula   . Right frontal lobe lesion 12/13/11    CT head  . History of radiation therapy 12/24/11-02/08/12    rul 66Gy/28fxs  . Dysphagia   . History of radiation therapy 12/27/11    SRS rt ant frontal 20gy  . Lung cancer 11/23/11    biopsy-right  . Brain metastases   . Chest pain at rest 08/26/2012  . COPD (chronic obstructive pulmonary disease)   . Shortness of breath     Past Surgical History  Procedure Laterality Date  . Partial hysterectomy    . Hemmorhoid surgery    . Neck surgery    . Retinal detachment surgery    . Esophagogastroduodenoscopy N/A 05/01/2012    Procedure: ESOPHAGOGASTRODUODENOSCOPY (EGD);  Surgeon: Shirley Friar, MD;  Location: [facility];  Service: Endoscopy;  Laterality: N/A;  . Subxyphoid pericardial window N/A 08/28/2012    Procedure: SUBXYPHOID PERICARDIAL WINDOW;  Surgeon: Kerin Perna, MD;  Location[facility] OR;  Service: Thoracic;  Laterality: N/A;  . Tee without cardioversion  08/28/2012    Procedure: TRANSESOPHAGEAL ECHOCARDIOGRAM (TEE);  Surgeon: Kerin Perna, MD;  Location: [facility] OR;   Service: Thoracic;;    Family History  Problem Relation Age of Onset  . Heart disease Father   . Kidney disease Mother   . Alzheimer's disease Sister   . Alzheimer's disease Brother     Social History History  Substance Use Topics  . Smoking status: Current Every Day Smoker -- 1.00 packs/day for 43 years    Types: Cigarettes  . Smokeless tobacco: Never Used  . Alcohol Use: No    Current Outpatient Prescriptions  Medication Sig Dispense Refill  . albuterol (PROVENTIL) (5 MG/ML) 0.5% nebulizer solution Take 0.5 mLs (2.5 mg total) by nebulization every 4 (four) hours as needed for wheezing or shortness of breath.  20 mL  5  . erlotinib (TARCEVA) 150 MG tablet Take 1 tablet (150 mg total) by mouth daily. Take on an empty stomach 1 hour before meals or 2 hours after.  30 tablet  2  . folic acid (FOLVITE) 1 MG tablet Take 1 tablet (1 mg total) by mouth daily.  30 tablet  5  . ipratropium (ATROVENT) 0.02 % nebulizer solution Take 2.5 mLs (0.5 mg total) by nebulization every 4 (four) hours as needed (pt mixes with albuterol).  75 mL  5  . oxyCODONE-acetaminophen (PERCOCET/ROXICET) 5-325 MG per tablet Take 1-2 tablets by mouth every 4 (four) hours as needed.  30 tablet  0  . sucralfate (CARAFATE) 1 G tablet Take 1 g by mouth daily.      [name]  dexamethasone (DECADRON) 4 MG tablet Take 4 mg by mouth 2 (two) times daily.       No current facility-administered medications for this visit.    Allergies  Allergen Reactions  . Carboplatin Shortness Of Breath  . Levaquin (Levofloxacin In D5w) Rash    itching    Review of Systems no fever improved strength   chest x-ray clear except for scarring at right apex Previous echo last month shows EF 50% BP 88/57  Pulse 118  Resp 16  Ht 5\' 2"  (1.575 m)  Wt 101 lb (45.813 kg)  BMI 18.47 kg/m2  SpO2 99% Physical Exam  alert and comfortable   no JVD Firm tender red 1 x 1 cm nodule right posterior neck Lungs diminished breath sounds at the right  upper lung field Heart rate tachycardia without murmur or gallop Abdomen soft No peripheral edema Neuro intact  Diagnostic Tests:  chest x-ray shows scarring right apex from previous lung cancer. No evidence of cardiac enlargement. No evidence of pleural effusion.   ImpressionResolved pericarditis and no evidence recurrent pericardial effusion. Nodular mass in the right posterior neck could be infected cyst versus metastatic nodule. Excision of right neck mass in OR under MAC anesthesia on July 25l

## 2012-09-25 ENCOUNTER — Encounter: Payer: Self-pay | Admitting: *Deleted

## 2012-09-25 MED ORDER — VANCOMYCIN HCL IN DEXTROSE 1-5 GM/200ML-% IV SOLN
1000.0000 mg | INTRAVENOUS | Status: AC
  Administered 2012-09-26: 1000 mg via INTRAVENOUS
  Filled 2012-09-25: qty 200

## 2012-09-26 ENCOUNTER — Ambulatory Visit (HOSPITAL_COMMUNITY): Payer: Medicaid Other | Admitting: Critical Care Medicine

## 2012-09-26 ENCOUNTER — Encounter (HOSPITAL_COMMUNITY)
Admission: RE | Disposition: A | Discharge status: Home / Self Care | Payer: Self-pay | Source: Ambulatory Visit | Attending: Cardiothoracic Surgery

## 2012-09-26 ENCOUNTER — Ambulatory Visit (HOSPITAL_COMMUNITY)
Admission: RE | Admit: 2012-09-26 | Discharge: 2012-09-26 | Disposition: A | Discharge status: Home / Self Care | Payer: Medicaid Other | Source: Ambulatory Visit | Attending: Cardiothoracic Surgery | Admitting: Cardiothoracic Surgery

## 2012-09-26 ENCOUNTER — Encounter (HOSPITAL_COMMUNITY): Payer: Self-pay | Admitting: *Deleted

## 2012-09-26 ENCOUNTER — Encounter (HOSPITAL_COMMUNITY): Payer: Self-pay | Admitting: Critical Care Medicine

## 2012-09-26 DIAGNOSIS — J449 Chronic obstructive pulmonary disease, unspecified: Secondary | ICD-10-CM | POA: Insufficient documentation

## 2012-09-26 DIAGNOSIS — L723 Sebaceous cyst: Secondary | ICD-10-CM | POA: Insufficient documentation

## 2012-09-26 DIAGNOSIS — D649 Anemia, unspecified: Secondary | ICD-10-CM | POA: Insufficient documentation

## 2012-09-26 DIAGNOSIS — D485 Neoplasm of uncertain behavior of skin: Secondary | ICD-10-CM

## 2012-09-26 DIAGNOSIS — F17200 Nicotine dependence, unspecified, uncomplicated: Secondary | ICD-10-CM | POA: Insufficient documentation

## 2012-09-26 DIAGNOSIS — R221 Localized swelling, mass and lump, neck: Secondary | ICD-10-CM

## 2012-09-26 DIAGNOSIS — J4489 Other specified chronic obstructive pulmonary disease: Secondary | ICD-10-CM | POA: Insufficient documentation

## 2012-09-26 HISTORY — PX: MASS EXCISION: SHX2000

## 2012-09-26 LAB — URINALYSIS, ROUTINE W REFLEX MICROSCOPIC
Bilirubin Urine: NEGATIVE
Glucose, UA: NEGATIVE mg/dL
Hgb urine dipstick: NEGATIVE
Ketones, ur: NEGATIVE mg/dL
Leukocytes, UA: NEGATIVE
Nitrite: NEGATIVE
Protein, ur: NEGATIVE mg/dL
Specific Gravity, Urine: 1.026 (ref 1.005–1.030)
Urobilinogen, UA: 0.2 mg/dL (ref 0.0–1.0)
pH: 5 (ref 5.0–8.0)

## 2012-09-26 LAB — TYPE AND SCREEN
ABO/RH(D): O POS
Antibody Screen: NEGATIVE

## 2012-09-26 LAB — PROTIME-INR
INR: 0.95 (ref 0.00–1.49)
Prothrombin Time: 12.5 seconds (ref 11.6–15.2)

## 2012-09-26 LAB — APTT: aPTT: 33 seconds (ref 24–37)

## 2012-09-26 SURGERY — EXCISION MASS
Anesthesia: General | Site: Neck | Laterality: Right | Wound class: Dirty or Infected

## 2012-09-26 MED ORDER — LIDOCAINE HCL (PF) 1 % IJ SOLN
INTRAMUSCULAR | Status: AC
  Filled 2012-09-26: qty 30

## 2012-09-26 MED ORDER — OXYCODONE HCL 5 MG/5ML PO SOLN: 5.0000 mg | Freq: Once | ORAL | Status: DC | PRN

## 2012-09-26 MED ORDER — LIDOCAINE HCL (PF) 1 % IJ SOLN
INTRAMUSCULAR | Status: DC | PRN
  Administered 2012-09-26: 7.5 mL

## 2012-09-26 MED ORDER — FENTANYL CITRATE 0.05 MG/ML IJ SOLN
INTRAMUSCULAR | Status: DC | PRN
  Administered 2012-09-26 (×2): 25 ug via INTRAVENOUS

## 2012-09-26 MED ORDER — LACTATED RINGERS IV SOLN
INTRAVENOUS | Status: DC
  Administered 2012-09-26: 07:00:00 via INTRAVENOUS

## 2012-09-26 MED ORDER — PROPOFOL 10 MG/ML IV BOLUS
INTRAVENOUS | Status: DC | PRN
  Administered 2012-09-26: 30 mg via INTRAVENOUS

## 2012-09-26 MED ORDER — BACITRACIN ZINC 500 UNIT/GM EX OINT
TOPICAL_OINTMENT | CUTANEOUS | Status: AC
  Filled 2012-09-26: qty 15

## 2012-09-26 MED ORDER — 0.9 % SODIUM CHLORIDE (POUR BTL) OPTIME
TOPICAL | Status: DC | PRN
  Administered 2012-09-26: 1000 mL

## 2012-09-26 MED ORDER — LIDOCAINE-EPINEPHRINE 1 %-1:100000 IJ SOLN
INTRAMUSCULAR | Status: AC
  Filled 2012-09-26: qty 1

## 2012-09-26 MED ORDER — ONDANSETRON HCL 4 MG/2ML IJ SOLN: 4.0000 mg | Freq: Four times a day (QID) | INTRAMUSCULAR | Status: DC | PRN

## 2012-09-26 MED ORDER — ONDANSETRON HCL 4 MG/2ML IJ SOLN
INTRAMUSCULAR | Status: DC | PRN
  Administered 2012-09-26: 4 mg via INTRAVENOUS

## 2012-09-26 MED ORDER — FENTANYL CITRATE 0.05 MG/ML IJ SOLN: 25.0000 ug | INTRAMUSCULAR | Status: DC | PRN

## 2012-09-26 MED ORDER — MIDAZOLAM HCL 5 MG/5ML IJ SOLN
INTRAMUSCULAR | Status: DC | PRN
  Administered 2012-09-26: 2 mg via INTRAVENOUS

## 2012-09-26 MED ORDER — OXYCODONE HCL 5 MG PO TABS: 5.0000 mg | ORAL_TABLET | Freq: Once | ORAL | Status: DC | PRN

## 2012-09-26 SURGICAL SUPPLY — 34 items
BLADE SURG CLIPPER 3M 9600 (MISCELLANEOUS) ×3 IMPLANT
CANISTER SUCTION 2500CC (MISCELLANEOUS) ×3 IMPLANT
CLOTH BEACON ORANGE TIMEOUT ST (SAFETY) ×3 IMPLANT
CONT SPEC 4OZ CLIKSEAL STRL BL (MISCELLANEOUS) ×6 IMPLANT
COVER SURGICAL LIGHT HANDLE (MISCELLANEOUS) ×3 IMPLANT
DRAIN JACKSON PRT FLT 10 (DRAIN) IMPLANT
ELECT REM PT RETURN 9FT ADLT (ELECTROSURGICAL) ×3
ELECTRODE REM PT RTRN 9FT ADLT (ELECTROSURGICAL) ×1 IMPLANT
EVACUATOR SILICONE 100CC (DRAIN) IMPLANT
GLOVE BIOGEL PI IND STRL 6.5 (GLOVE) ×2 IMPLANT
GLOVE BIOGEL PI INDICATOR 6.5 (GLOVE) ×4
GLOVE ECLIPSE 6.5 STRL STRAW (GLOVE) ×3 IMPLANT
GLOVE ORTHO TXT STRL SZ7.5 (GLOVE) ×6 IMPLANT
GOWN STRL NON-REIN LRG LVL3 (GOWN DISPOSABLE) ×6 IMPLANT
KIT BASIN OR (CUSTOM PROCEDURE TRAY) ×3 IMPLANT
KIT ROOM TURNOVER OR (KITS) ×3 IMPLANT
NEEDLE HYPO 25GX1X1/2 BEV (NEEDLE) ×3 IMPLANT
NS IRRIG 1000ML POUR BTL (IV SOLUTION) ×3 IMPLANT
PACK CAROTID (CUSTOM PROCEDURE TRAY) ×3 IMPLANT
PAD ARMBOARD 7.5X6 YLW CONV (MISCELLANEOUS) ×6 IMPLANT
PENCIL BUTTON HOLSTER BLD 10FT (ELECTRODE) ×3 IMPLANT
SPONGE GAUZE 4X4 12PLY (GAUZE/BANDAGES/DRESSINGS) ×3 IMPLANT
STAPLER VISISTAT 35W (STAPLE) IMPLANT
SUT SILK 2 0 FS (SUTURE) IMPLANT
SUT VIC AB 3-0 PS2 18 (SUTURE)
SUT VIC AB 3-0 PS2 18XBRD (SUTURE) IMPLANT
SUT VIC AB 3-0 SH 8-18 (SUTURE) ×3 IMPLANT
SWAB COLLECTION DEVICE MRSA (MISCELLANEOUS) ×3 IMPLANT
SYR CONTROL 10ML LL (SYRINGE) ×3 IMPLANT
TAPE CLOTH SURG 4X10 WHT LF (GAUZE/BANDAGES/DRESSINGS) ×3 IMPLANT
TOWEL OR 17X24 6PK STRL BLUE (TOWEL DISPOSABLE) ×3 IMPLANT
TOWEL OR 17X26 10 PK STRL BLUE (TOWEL DISPOSABLE) ×3 IMPLANT
TUBE ANAEROBIC SPECIMEN COL (MISCELLANEOUS) IMPLANT
WATER STERILE IRR 1000ML POUR (IV SOLUTION) ×3 IMPLANT

## 2012-09-26 NOTE — Progress Notes (Signed)
report given to Janann August

## 2012-09-26 NOTE — Brief Op Note (Signed)
09/26/2012  10:04 AM  PATIENT:  [name]  62 y.o. female  PRE-OPERATIVE DIAGNOSIS:  RIGHT NECK MASS HX: LUNG CANCER  POST-OPERATIVE DIAGNOSIS:  RIGHT NECK MASS HX: LUNG CANCER  PROCEDURE:  Procedure(s) with comments: EXCISION MASS (Right) - EXCISION OF RIGHT NECK  MASS  SURGEON:  Surgeon(s) and Role:    * Kerin Perna, MD - Primary  PHYSICIAN ASSISTANT  0   ASSISTANTS: none   ANESTHESIA:   local and IV sedation  EBL:  Total I/O In: 600 [I.V.:600] Out: 20 [Blood:20]  BLOOD ADMINISTERED:none  DRAINS: none   LOCAL MEDICATIONS USED:  LIDOCAINE  and Amount: 7.5 ml  SPECIMEN:  Excision  DISPOSITION OF SPECIMEN:  PATHOLOGY  COUNTS:  YES  TOURNIQUET:    DICTATION: .Dragon Dictation  PLAN OF CARE: Discharge to home after PACU  PATIENT DISPOSITION:  Short Stay   Delay start of Pharmacological VTE agent (>24hrs) due to surgical blood loss or risk of bleeding: no

## 2012-09-26 NOTE — Preoperative (Signed)
Beta Blockers   Reason not to administer Beta Blockers:Not Applicable 

## 2012-09-26 NOTE — Anesthesia Postprocedure Evaluation (Signed)
Anesthesia Post Note  Patient: Melanie Liu  Procedure(s) Performed: Procedure(s) (LRB): EXCISION MASS (Right)  Anesthesia type: MAC  Patient location: PACU  Post pain: Pain level controlled and Adequate analgesia  Post assessment: Post-op Vital signs reviewed, Patient's Cardiovascular Status Stable and Respiratory Function Stable  Last Vitals:  Filed Vitals:   09/26/12 1015  BP: 102/59  Pulse: 93  Temp:   Resp: 22    Post vital signs: Reviewed and stable  Level of consciousness: awake, alert  and oriented  Complications: No apparent anesthesia complications

## 2012-09-26 NOTE — Progress Notes (Signed)
Dr. Donata Clay visited pt at bedsode discussed surgery and meds for pt to take at home

## 2012-09-26 NOTE — Anesthesia Preprocedure Evaluation (Addendum)
Anesthesia Evaluation  Patient identified by MRN, date of birth, ID band Patient awake    Reviewed: Allergy & Precautions, H&P , NPO status , Patient's Chart, lab work & pertinent test results  Airway Mallampati: II  Neck ROM: full    Dental   Pulmonary shortness of breath, COPDCurrent Smoker,  Lung CA s/p radiation         Cardiovascular     Neuro/Psych    GI/Hepatic   Endo/Other    Renal/GU      Musculoskeletal   Abdominal   Peds  Hematology  (+) Blood dyscrasia, anemia ,   Anesthesia Other Findings   Reproductive/Obstetrics                          Anesthesia Physical Anesthesia Plan  ASA: III  Anesthesia Plan: General   Post-op Pain Management:    Induction: Intravenous  Airway Management Planned: Oral ETT  Additional Equipment:   Intra-op Plan:   Post-operative Plan: Extubation in OR  Informed Consent: I have reviewed the patients History and Physical, chart, labs and discussed the procedure including the risks, benefits and alternatives for the proposed anesthesia with the patient or authorized representative who has indicated his/her understanding and acceptance.     Plan Discussed with: CRNA, Anesthesiologist and Surgeon  Anesthesia Plan Comments:         Anesthesia Quick Evaluation

## 2012-09-26 NOTE — Progress Notes (Signed)
The patient was examined and preop studies reviewed. There has been no change from the prior exam and the patient is ready for surgery.  Plan excision of R neck mass on M [place]

## 2012-09-26 NOTE — Transfer of Care (Signed)
Immediate Anesthesia Transfer of Care Note  Patient: Melanie Liu  Procedure(s) Performed: Procedure(s) with comments: EXCISION MASS (Right) - EXCISION OF RIGHT NECK  MASS  Patient Location: PACU  Anesthesia Type:MAC  Level of Consciousness: awake, alert  and oriented  Airway & Oxygen Therapy: Patient Spontanous Breathing and Patient connected to nasal cannula oxygen  Post-op Assessment: Report given to PACU RN, Post -op Vital signs reviewed and stable and Patient moving all extremities X 4  Post vital signs: Reviewed and stable  Complications: No apparent anesthesia complications

## 2012-09-26 NOTE — Op Note (Signed)
[name], Melanie Liu NO.:  [id number]  MEDICAL RECORD NO.:  [id number]  LOCATION:  [facility]                         FACILITY:  [facility]  PHYSICIAN:  Kerin Perna, M.D.  DATE OF BIRTH:  09/30/1950  DATE OF PROCEDURE:  09/26/2012 DATE OF DISCHARGE:  09/26/2012                              OPERATIVE REPORT   OPERATION:  Excision of right neck subcutaneous mass.  SURGEON:  Kerin Perna, M.D.  ANESTHESIA:  Local with IV conscious sedation, monitored by anesthesia.  PREOPERATIVE DIAGNOSIS:  History of advanced stage lung cancer with new onset right neck subcutaneous mass.  POSTOPERATIVE DIAGNOSIS:  History of advanced stage lung cancer with new onset right neck subcutaneous mass.  OPERATIVE PROCEDURE:  The patient was brought to the operating room after informed consent had been obtained.  She was placed supine on the operative table and positioned to expose the right neck.  She was given intravenous conscious sedation and monitored by anesthesia.  The neck was prepped and draped as a sterile field.  A proper time-out was performed.  Local 1% lidocaine was infiltrated around the mass.  An elliptical incision was used and the mass was excised from the subcutaneous tissue. It had some drainage of sebaceous type material and this was sent for culture.  The mass itself was sent for pathology.  The wound was irrigated.  Hemostasis was achieved.  The subcutaneous layer was closed with interrupted 3-0 Vicryl.  The skin was closed with interrupted 3-0 nylon.  A sterile dressing was applied.  The patient then returned to recovery room to go to short stay for outpatient care.     Kerin Perna, M.D.     PV/MEDQ  D:  09/26/2012  T:  09/26/2012  Job:  [id number]

## 2012-09-29 LAB — TISSUE CULTURE
Culture: NO GROWTH
Gram Stain: NONE SEEN

## 2012-09-30 ENCOUNTER — Encounter (HOSPITAL_COMMUNITY): Payer: Self-pay | Admitting: Cardiothoracic Surgery

## 2012-09-30 ENCOUNTER — Ambulatory Visit (INDEPENDENT_AMBULATORY_CARE_PROVIDER_SITE_OTHER): Payer: Self-pay | Admitting: Cardiovascular Disease

## 2012-09-30 ENCOUNTER — Other Ambulatory Visit: Payer: Self-pay | Admitting: *Deleted

## 2012-09-30 VITALS — BP 92/70 | HR 108 | Ht 62.0 in | Wt 103.9 lb

## 2012-09-30 DIAGNOSIS — Z720 Tobacco use: Secondary | ICD-10-CM

## 2012-09-30 DIAGNOSIS — R221 Localized swelling, mass and lump, neck: Secondary | ICD-10-CM

## 2012-09-30 DIAGNOSIS — I313 Pericardial effusion (noninflammatory): Secondary | ICD-10-CM

## 2012-09-30 DIAGNOSIS — F17200 Nicotine dependence, unspecified, uncomplicated: Secondary | ICD-10-CM

## 2012-09-30 DIAGNOSIS — I319 Disease of pericardium, unspecified: Secondary | ICD-10-CM

## 2012-09-30 DIAGNOSIS — J449 Chronic obstructive pulmonary disease, unspecified: Secondary | ICD-10-CM

## 2012-09-30 DIAGNOSIS — I3139 Other pericardial effusion (noninflammatory): Secondary | ICD-10-CM

## 2012-09-30 DIAGNOSIS — R000 Tachycardia, unspecified: Secondary | ICD-10-CM

## 2012-09-30 NOTE — Patient Instructions (Addendum)
Your physician has requested that you have an echocardiogram. Echocardiography is a painless test that uses sound waves to create images of your heart. It provides your doctor with information about the size and shape of your heart and how well your heart's chambers and valves are working. This procedure takes approximately one hour. There are no restrictions for this procedure.  Your physician recommends that you schedule a follow-up appointment in: 3 MONTHS 

## 2012-09-30 NOTE — Progress Notes (Signed)
Patient ID: Melanie Liu, female   DOB: [birth year], 62 y.o.   MRN: [id number]     HPI: Melanie Liu, is a 62 y.o. female who presents to the office in followup of her recent pericardial window surgery for pericardial effusion.  This Melanie Liu is a 62 year old female who has a long-standing history of tobacco abuse in September 2013 was diagnosed with lung cancer. She is followed by Dr. 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 She was hospitalized in late June with moderate pericardial effusion. At that time, she had developed worsening pleuritic chest pain and shortness of breath for non-9 days leading to her hospitalization. I've seen her for initial Cardiologic evaluation. The following day, she underwent a subxiphoid pericardial window surgery by Dr. Morton Peters. Subsequent cytology and distal tissue pathology was negative for malignancy. Apparently she saw Dr. Guadalupe Maple in followup and was noted to have a right posterior neck mass. She underwent excision of this neck mass on 09/26/2012 as scheduled see Dr. Illene Bolus tomorrow for followup evaluation. She does not know the pathology as of yet. [name] has been on for stable for repair she continues to smoke cigarettes. She denies recurrent pleuritic chest pain. Her blood pressure tends to be low. Her pulse typically is running around 100. She denies dizziness. She denies presyncope or syncope.  Past Medical History  Diagnosis Date  . Nodule of right lung     right upper lobe  . Anemia   . Rectovaginal fistula   . Right frontal lobe lesion 12/13/11    CT head  . History of radiation therapy 12/24/11-02/08/12    rul 66Gy/68fxs  . Dysphagia   . History of radiation therapy 12/27/11    SRS rt ant frontal 20gy  . Lung cancer 11/23/11    biopsy-right  . Brain metastases   . Chest pain at rest 08/26/2012  . COPD (chronic obstructive pulmonary disease)   . Shortness of breath     Past Surgical History  Procedure  Laterality Date  . Partial hysterectomy    . Hemmorhoid surgery    . Neck surgery    . Retinal detachment surgery    . Esophagogastroduodenoscopy N/A 05/01/2012    Procedure: ESOPHAGOGASTRODUODENOSCOPY (EGD);  Surgeon: Shirley Friar, MD;  Location: [facility];  Service: Endoscopy;  Laterality: N/A;  . Subxyphoid pericardial window N/A 08/28/2012    Procedure: SUBXYPHOID PERICARDIAL WINDOW;  Surgeon: Kerin Perna, MD;  Location: [facility] OR;  Service: Thoracic;  Laterality: N/A;  . Tee without cardioversion  08/28/2012    Procedure: TRANSESOPHAGEAL ECHOCARDIOGRAM (TEE);  Surgeon: Kerin Perna, MD;  Location: [facility] OR;  Service: Thoracic;;  . Eye surgery    . Tubal ligation    . Mass excision Right 09/26/2012    Procedure: EXCISION MASS;  Surgeon: Kerin Perna, MD;  Location: [facility] OR;  Service: Vascular;  Laterality: Right;  EXCISION OF RIGHT NECK  MASS    Allergies  Allergen Reactions  . Carboplatin Shortness Of Breath  . Levaquin (Levofloxacin In D5w) Rash    itching    Current Outpatient Prescriptions  Medication Sig Dispense Refill  . albuterol (PROVENTIL) (5 MG/ML) 0.5% nebulizer solution Take 2.5 mg by nebulization every 6 (six) hours as needed for wheezing.      . cephALEXin (KEFLEX) 500 MG capsule Take 500 mg by mouth 4 (four) times daily.      [name] erlotinib (TARCEVA) 150 MG tablet Take 150 mg by  mouth daily. Take on an empty stomach 1 hour before meals or 2 hours after.      . folic acid (FOLVITE) 1 MG tablet Take 1 mg by mouth daily.      [name] ipratropium (ATROVENT) 0.02 % nebulizer solution Take 500 mcg by nebulization every 4 (four) hours as needed for wheezing.      [name] oxyCODONE-acetaminophen (PERCOCET/ROXICET) 5-325 MG per tablet Take 1 tablet by mouth every 4 (four) hours as needed for pain.      [name] sucralfate (CARAFATE) 1 G tablet Take 1 g by mouth daily.       No current facility-administered medications for this visit.    Socially she is single she has 2 children 4  grandchildren 1 great-grandchild.  ROS is negative for fevers, chills or night sweats.  She denies bleeding. She denies cough the denies pleuritic chest pain. She denies wheezing. She apparently has metastases to her brain and liver. She does complain of right shoulder discomfort which has been persistent. She denies myalgias. She denies significant edema. Other system review is negative.  PE BP 92/70  Pulse 108  Ht 5\' 2"  (1.575 m)  Wt 103 lb 14.4 oz (47.129 kg)  BMI 19 kg/m2  General: Alert, oriented, no distress.  Skin: normal turgor, no rashes HEENT: Normocephalic, atraumatic. Pupils round and reactive; sclera anicteric;no lid lag.  Nose without nasal septal hypertrophy Mouth/Parynx benign; Mallinpatti scale 2 Neck: No JVD, no carotid briuts; bandage over right posterior neck mass excision. Lungs: clear to ausculatation and percussion; no wheezing or rales Heart: Regular heart rate but tachycardic at approximately 100-105 beats per minute, s1 s2 normal; no friction rub. 1/6 systolic murmur Abdomen: soft, nontender; no hepatosplenomehaly, BS+; abdominal aorta nontender and not dilated by palpation. Pulses 2+ Extremities: no clubbing cyanosis or edema, Homan's sign negative  Neurologic: grossly nonfocal  ECG: Sinus tachycardia 108 beats per minute. No significant ST elevation. Mild PR downsloping.  LABS:  BMET    Component Value Date/Time   NA 140 09/22/2012 1036   NA 137 08/30/2012 0355   K 3.9 09/22/2012 1036   K 3.7 08/30/2012 0355   CL 99 08/30/2012 0355   CL 102 08/18/2012 0938   CO2 28 09/22/2012 1036   CO2 31 08/30/2012 0355   GLUCOSE 155* 09/22/2012 1036   GLUCOSE 93 08/30/2012 0355   GLUCOSE 114* 08/18/2012 0938   BUN 15.5 09/22/2012 1036   BUN 10 08/30/2012 0355   CREATININE 0.8 09/22/2012 1036   CREATININE 0.63 08/30/2012 0355   CALCIUM 10.5* 09/22/2012 1036   CALCIUM 8.9 08/30/2012 0355   GFRNONAA >90 08/30/2012 0355   GFRAA >90 08/30/2012 0355     Hepatic Function Panel      Component Value Date/Time   PROT 7.7 09/22/2012 1036   PROT 5.8* 08/30/2012 0355   ALBUMIN 3.0* 09/22/2012 1036   ALBUMIN 2.1* 08/30/2012 0355   AST 11 09/22/2012 1036   AST 10 08/30/2012 0355   ALT 6 09/22/2012 1036   ALT <5 08/30/2012 0355   ALKPHOS 95 09/22/2012 1036   ALKPHOS 97 08/30/2012 0355   BILITOT 0.22 09/22/2012 1036   BILITOT 0.1* 08/30/2012 0355     CBC    Component Value Date/Time   WBC 10.1 09/22/2012 1036   WBC 13.3* 08/30/2012 0355   RBC 3.22* 09/22/2012 1036   RBC 2.96* 08/30/2012 0355   HGB 9.7* 09/22/2012 1036   HGB 8.8* 08/30/2012 0355   HCT 29.8* 09/22/2012 1036   HCT  28.8* 08/30/2012 0355   PLT 415* 09/22/2012 1036   PLT 369 08/30/2012 0355   MCV 92.6 09/22/2012 1036   MCV 97.3 08/30/2012 0355   MCH 30.2 09/22/2012 1036   MCH 29.7 08/30/2012 0355   MCHC 32.6 09/22/2012 1036   MCHC 30.6 08/30/2012 0355   RDW 17.5* 09/22/2012 1036   RDW 17.9* 08/30/2012 0355   LYMPHSABS 0.4* 09/22/2012 1036   LYMPHSABS 0.7 04/30/2012 1240   MONOABS 0.8 09/22/2012 1036   MONOABS 1.1* 04/30/2012 1240   EOSABS 0.3 09/22/2012 1036   EOSABS 0.0 04/30/2012 1240   BASOSABS 0.1 09/22/2012 1036   BASOSABS 0.0 04/30/2012 1240     BNP    Component Value Date/Time   PROBNP 635.4* 08/27/2012 0200    Lipid Panel  No results found for this basename: chol, trig, hdl, cholhdl, vldl, ldlcalc     RADIOLOGY: Dg Chest 2 View  09/24/2012   *RADIOLOGY REPORT*  Clinical Data: Follow-up pericardial effusion status post sub xyphoid pericardial window.  CHEST - 2 VIEW  Comparison: Radiographs 09/01/2012.  CT 08/27/2012.  Findings: The heart size is stable without evidence of recurrent pericardial effusion.  There is stable mediastinal shift to the right and right apical pleural parenchymal density.  The left lung is clear.  There is no significant residual pleural effusion.  A retrosternal lucency on the lateral view has no definite correlative finding on the frontal examination and is probably related to  rotation.  I do not see definite findings to suggest an anterior pneumothorax or pneumomediastinum.  IMPRESSION:  1.  Stable heart size without evidence of recurrent pericardial effusion. 2.  Resolved pleural effusions.   Original Report Authenticated By: Carey Bullocks, M.D.   Dg Chest 2 View  09/01/2012   *RADIOLOGY REPORT*  Clinical Data: Postoperative chest x-ray status post pericardial window, cough  CHEST - 2 VIEW  Comparison: Prior chest x-ray 08/30/2012; CT scan 08/27/2012  Findings: The right IJ central venous catheter has been removed. The pericardial drain has also been removed.  Stable cardiopericardial silhouette.  There may be a small amount of pneumomediastinum.  Stable moderate left and small right pleural effusions with associated bibasilar atelectasis.  Right apical mass with adjacent postobstructive changes has not significantly changed in appearance compared to the recent prior radiographs. Incompletely imaged cervical spine stabilization hardware at C7-T1. No acute osseous abnormality.  IMPRESSION:  1.  Interval removal of support apparatus as above without evidence of complication. 2.  Decreasing pneumomediastinum. 3.  Stable moderate left and small right pleural effusions with associated bibasilar atelectasis. 4.  Stable right apical mass and associated postobstructive changes.   Original Report Authenticated By: Malachy Moan, M.D.      ASSESSMENT AND PLAN: Ms. Senat is now approximately one month following surgery with a pericardial window for moderate pericardial effusion. She has metastatic lung disease with documented metastases to her brain and liver. Her pericardial fluid cytology and tissue pathology have been negative. She underwent excision of a right posterior neck mass last week. At present, she seems to have stable hemodynamics although she is tachycardic. She did not have any pulsus paradox at this period her blood pressure is somewhat limiting and consequently I do  not feel I can initiate low-dose beta blocker therapy for improved heart rate control presently. I am recommending that she undergo a followup 2-D echo Doppler study one month after her pericardial window for effusion. We will contact her regarding the results. I will see her in 3 months  for followup evaluation. I did discuss the importance of smoking cessation but she is not interested.     Lennette Bihari, MD, [facility]  09/30/2012 12:30 PM

## 2012-10-01 ENCOUNTER — Ambulatory Visit (INDEPENDENT_AMBULATORY_CARE_PROVIDER_SITE_OTHER): Payer: Self-pay | Admitting: Cardiothoracic Surgery

## 2012-10-01 ENCOUNTER — Encounter: Payer: Self-pay | Admitting: Cardiothoracic Surgery

## 2012-10-01 ENCOUNTER — Other Ambulatory Visit: Payer: Self-pay | Admitting: Radiation Therapy

## 2012-10-01 VITALS — BP 99/70 | HR 116 | Resp 16 | Ht 62.0 in | Wt 101.0 lb

## 2012-10-01 DIAGNOSIS — C7949 Secondary malignant neoplasm of other parts of nervous system: Secondary | ICD-10-CM

## 2012-10-01 DIAGNOSIS — Z09 Encounter for follow-up examination after completed treatment for conditions other than malignant neoplasm: Secondary | ICD-10-CM

## 2012-10-01 DIAGNOSIS — R221 Localized swelling, mass and lump, neck: Secondary | ICD-10-CM

## 2012-10-01 DIAGNOSIS — R220 Localized swelling, mass and lump, head: Secondary | ICD-10-CM

## 2012-10-02 NOTE — Progress Notes (Signed)
PCP is [name], MD Referring Provider is [name], MD  Chief Complaint  Patient presents with  . Routine Post Op    1 wk f/u with suture removal of excision of neck mass    HPI: Returns for wound check after excision of right neck subcutaneous mass and history of stage IV lung cancer. Pathology shows this to be a inclusion cyst no malignancy noted. Incision healing well. 2 of the 4 sutures are removed.  Past Medical History  Diagnosis Date  . Nodule of right lung     right upper lobe  . Anemia   . Rectovaginal fistula   . Right frontal lobe lesion 12/13/11    CT head  . History of radiation therapy 12/24/11-02/08/12    rul 66Gy/57fxs  . Dysphagia   . History of radiation therapy 12/27/11    SRS rt ant frontal 20gy  . Lung cancer 11/23/11    biopsy-right  . Brain metastases   . Chest pain at rest 08/26/2012  . COPD (chronic obstructive pulmonary disease)   . Shortness of breath     Past Surgical History  Procedure Laterality Date  . Partial hysterectomy    . Hemmorhoid surgery    . Neck surgery    . Retinal detachment surgery    . Esophagogastroduodenoscopy N/A 05/01/2012    Procedure: ESOPHAGOGASTRODUODENOSCOPY (EGD);  Surgeon: Shirley Friar, MD;  Location: [facility];  Service: Endoscopy;  Laterality: N/A;  . Subxyphoid pericardial window N/A 08/28/2012    Procedure: SUBXYPHOID PERICARDIAL WINDOW;  Surgeon: Kerin Perna, MD;  Location: [facility] OR;  Service: Thoracic;  Laterality: N/A;  . Tee without cardioversion  08/28/2012    Procedure: TRANSESOPHAGEAL ECHOCARDIOGRAM (TEE);  Surgeon: Kerin Perna, MD;  Location: [facility] OR;  Service: Thoracic;;  . Eye surgery    . Tubal ligation    . Mass excision Right 09/26/2012    Procedure: EXCISION MASS;  Surgeon: Kerin Perna, MD;  Location: [facility] OR;  Service: Vascular;  Laterality: Right;  EXCISION OF RIGHT NECK  MASS    Family History  Problem Relation Age of Onset  . Heart disease Father   . Kidney  disease Mother   . Alzheimer's disease Sister   . Alzheimer's disease Brother     Social History History  Substance Use Topics  . Smoking status: Current Every Day Smoker -- 0.50 packs/day for 43 years    Types: Cigarettes  . Smokeless tobacco: Never Used  . Alcohol Use: No    Current Outpatient Prescriptions  Medication Sig Dispense Refill  . albuterol (PROVENTIL) (5 MG/ML) 0.5% nebulizer solution Take 2.5 mg by nebulization every 6 (six) hours as needed for wheezing.      . erlotinib (TARCEVA) 150 MG tablet Take 150 mg by mouth daily. Take on an empty stomach 1 hour before meals or 2 hours after.      . folic acid (FOLVITE) 1 MG tablet Take 1 mg by mouth daily.      [name] ipratropium (ATROVENT) 0.02 % nebulizer solution Take 500 mcg by nebulization every 4 (four) hours as needed for wheezing.      [name] oxyCODONE-acetaminophen (PERCOCET/ROXICET) 5-325 MG per tablet Take 1 tablet by mouth every 4 (four) hours as needed for pain.      [name] sucralfate (CARAFATE) 1 G tablet Take 1 g by mouth daily.       No current facility-administered medications for this visit.    Allergies  Allergen Reactions  . Carboplatin  Shortness Of Breath  . Levaquin (Levofloxacin In D5w) Rash    itching    Review of Systems doing well without complaint  BP 99/70  Pulse 116  Resp 16  Ht 5\' 2"  (1.575 m)  Wt 101 lb (45.813 kg)  BMI 18.47 kg/m2  SpO2 98% Physical Exam Alert and responsive Right neck incision healing well no drainage or erythema  Diagnostic Tests: Not today  Impression: Doing well postop excision of neck cyst return for  Plan: Return for removal of the remaining sutures in 5 days

## 2012-10-03 ENCOUNTER — Other Ambulatory Visit: Payer: Self-pay | Admitting: *Deleted

## 2012-10-06 ENCOUNTER — Ambulatory Visit (HOSPITAL_BASED_OUTPATIENT_CLINIC_OR_DEPARTMENT_OTHER): Payer: Medicaid Other | Admitting: Physician Assistant

## 2012-10-06 ENCOUNTER — Encounter: Payer: Self-pay | Admitting: Physician Assistant

## 2012-10-06 ENCOUNTER — Telehealth: Payer: Self-pay | Admitting: Internal Medicine

## 2012-10-06 ENCOUNTER — Encounter: Payer: Self-pay | Admitting: *Deleted

## 2012-10-06 ENCOUNTER — Other Ambulatory Visit (HOSPITAL_BASED_OUTPATIENT_CLINIC_OR_DEPARTMENT_OTHER): Payer: Medicaid Other | Admitting: Lab

## 2012-10-06 ENCOUNTER — Ambulatory Visit (INDEPENDENT_AMBULATORY_CARE_PROVIDER_SITE_OTHER): Payer: Self-pay | Admitting: *Deleted

## 2012-10-06 DIAGNOSIS — R220 Localized swelling, mass and lump, head: Secondary | ICD-10-CM

## 2012-10-06 DIAGNOSIS — F17200 Nicotine dependence, unspecified, uncomplicated: Secondary | ICD-10-CM

## 2012-10-06 DIAGNOSIS — C7931 Secondary malignant neoplasm of brain: Secondary | ICD-10-CM

## 2012-10-06 DIAGNOSIS — Z9981 Dependence on supplemental oxygen: Secondary | ICD-10-CM

## 2012-10-06 DIAGNOSIS — Z4802 Encounter for removal of sutures: Secondary | ICD-10-CM

## 2012-10-06 DIAGNOSIS — R221 Localized swelling, mass and lump, neck: Secondary | ICD-10-CM

## 2012-10-06 DIAGNOSIS — C3410 Malignant neoplasm of upper lobe, unspecified bronchus or lung: Secondary | ICD-10-CM

## 2012-10-06 DIAGNOSIS — Z716 Tobacco abuse counseling: Secondary | ICD-10-CM

## 2012-10-06 LAB — COMPREHENSIVE METABOLIC PANEL (CC13)
ALT: <6 U/L (ref 0–55)
AST: 14 U/L (ref 5–34)
Alkaline Phosphatase: 98 U/L (ref 40–150)
BUN: 14.9 mg/dL (ref 7.0–26.0)
Calcium: 10.4 mg/dL (ref 8.4–10.4)
Chloride: 102 mEq/L (ref 98–109)
Creatinine: 0.9 mg/dL (ref 0.6–1.1)
Total Bilirubin: 0.2 mg/dL (ref 0.20–1.20)

## 2012-10-06 LAB — CBC WITH DIFFERENTIAL/PLATELET
BASO%: 0.9 % (ref 0.0–2.0)
Basophils Absolute: 0.1 10*3/uL (ref 0.0–0.1)
EOS%: 3.2 % (ref 0.0–7.0)
HCT: 30.6 % — ABNORMAL LOW (ref 34.8–46.6)
MCH: 29 pg (ref 25.1–34.0)
MCHC: 32.4 g/dL (ref 31.5–36.0)
MCV: 89.5 fL (ref 79.5–101.0)
MONO%: 9.4 % (ref 0.0–14.0)
NEUT%: 77.4 % — ABNORMAL HIGH (ref 38.4–76.8)
lymph#: 0.7 10*3/uL — ABNORMAL LOW (ref 0.9–3.3)

## 2012-10-06 NOTE — Progress Notes (Signed)
Melanie Liu returns for suture removal x 2 from the previous site of a small mass on her right neck near her hairline.  This area looks good and the site is healing well without sign of infection.  I applied benzoin and 2 steri-strips to the incision with instructions to not remove until this weekend.  She will return prn.

## 2012-10-06 NOTE — Progress Notes (Signed)
Mailed smoking cessation class information at [facility] to Ms. Hart [place].

## 2012-10-06 NOTE — Progress Notes (Addendum)
[facility] Telephone:(336) [phone]   Fax:([phone]  SHARED VISIT PROGRESS NOTE  Melanie Hector, MD [address] [place] [address]  DIAGNOSIS: Metastatic non-small cell lung cancer, adenocarcinoma with negative EGFR mutation and negative ALK gene translocation diagnosed in September of 2013 with metastatic single brain lesion.   PRIOR THERAPY:  1) Status post stereotactic radiosurgery to the single brain lesion under the care Dr. Kathrynn Running.  2) Concurrent chemoradiation with chemotherapy in the form of weekly carboplatin for an AUC of 2 and paclitaxel at 45 mg per meter squared concurrent with radiation therapy under the care Dr. Kathrynn Running with partial response in her disease.  3) Systemic chemotherapy with carboplatin for AUC of 5 and Alimta 500 mg/M2 every 3 weeks. From cycle 3 forward she is receiving carboplatin for an AUC of 4 and Alimta at 375 mg per meter squared due to thrombocytopenia. Status post a total of 6 cycles with evidence for disease progression. 4) Subxiphoid pericardial window for drainage of pericardial effusion under the care of Dr. Donata Clay on 08/28/2012 and the final pathology showed no malignant cells in the pleural fluid.   CURRENT THERAPY: Tarceva 150 mg by mouth daily. Therapy starting 09/30/2012  DISEASE STAGE: Stage IV  CHEMOTHERAPY INTENT: Palliative  CURRENT # OF CHEMOTHERAPY CYCLES: 1 week of Tarceva  CURRENT ANTIEMETICS:  None   CURRENT SMOKING STATUS: Current smoker  ORAL CHEMOTHERAPY AND CONSENT: yes  CURRENT BISPHOSPHONATES USE:  none  PAIN MANAGEMENT:  Percocet  NARCOTICS INDUCED CONSTIPATION: none  LIVING WILL AND CODE STATUS:    INTERVAL HISTORY: Melanie Liu 62 y.o. female returns to the clinic today for followup visit accompanied by a family member. The patient is feeling fine today with no specific complaints except for mild fatigue and shortness breath with exertion. She has significant  improvement in her breathing after she underwent pericardial window for drainage of the large pericardial effusion under the care of Dr. Donata Clay.  Unfortunately she continues to smoke. She reports that she is down to a half pack of cigarettes daily. She continues to use home oxygen at night and on an as needed basis during the day.  She denied having any significant weight loss or night sweats. The patient denied having any chest pain but continues to have shortness of breath with exertion with no cough or hemoptysis. She started therapy with Tarceva approximately one week ago. Overall she is tolerating the Tarceva without difficulty. She has some increased dry skin related tot he Tarceva but has not developed an acneform rash or diarrhea.  MEDICAL HISTORY: Past Medical History  Diagnosis Date  . Nodule of right lung     right upper lobe  . Anemia   . Rectovaginal fistula   . Right frontal lobe lesion 12/13/11    CT head  . History of radiation therapy 12/24/11-02/08/12    rul 66Gy/58fxs  . Dysphagia   . History of radiation therapy 12/27/11    SRS rt ant frontal 20gy  . Lung cancer 11/23/11    biopsy-right  . Brain metastases   . Chest pain at rest 08/26/2012  . COPD (chronic obstructive pulmonary disease)   . Shortness of breath     ALLERGIES:  is allergic to carboplatin and levaquin.  MEDICATIONS:  Current Outpatient Prescriptions  Medication Sig Dispense Refill  . albuterol (PROVENTIL) (5 MG/ML) 0.5% nebulizer solution Take 2.5 mg by nebulization every 6 (six) hours as needed for wheezing.      [name]  erlotinib (TARCEVA) 150 MG tablet Take 150 mg by mouth daily. Take on an empty stomach 1 hour before meals or 2 hours after.      . folic acid (FOLVITE) 1 MG tablet Take 1 mg by mouth daily.      [name] ipratropium (ATROVENT) 0.02 % nebulizer solution Take 500 mcg by nebulization every 4 (four) hours as needed for wheezing.      [name] oxyCODONE-acetaminophen (PERCOCET/ROXICET) 5-325 MG per tablet  Take 1 tablet by mouth every 4 (four) hours as needed for pain.      [name] sucralfate (CARAFATE) 1 G tablet Take 1 g by mouth daily.       No current facility-administered medications for this visit.    SURGICAL HISTORY:  Past Surgical History  Procedure Laterality Date  . Partial hysterectomy    . Hemmorhoid surgery    . Neck surgery    . Retinal detachment surgery    . Esophagogastroduodenoscopy N/A 05/01/2012    Procedure: ESOPHAGOGASTRODUODENOSCOPY (EGD);  Surgeon: Shirley Friar, MD;  Location: [facility];  Service: Endoscopy;  Laterality: N/A;  . Subxyphoid pericardial window N/A 08/28/2012    Procedure: SUBXYPHOID PERICARDIAL WINDOW;  Surgeon: Kerin Perna, MD;  Location: [facility] OR;  Service: Thoracic;  Laterality: N/A;  . Tee without cardioversion  08/28/2012    Procedure: TRANSESOPHAGEAL ECHOCARDIOGRAM (TEE);  Surgeon: Kerin Perna, MD;  Location: [facility] OR;  Service: Thoracic;;  . Eye surgery    . Tubal ligation    . Mass excision Right 09/26/2012    Procedure: EXCISION MASS;  Surgeon: Kerin Perna, MD;  Location: [facility] OR;  Service: Vascular;  Laterality: Right;  EXCISION OF RIGHT NECK  MASS    REVIEW OF SYSTEMS:  A comprehensive review of systems was negative except for: Constitutional: positive for fatigue Respiratory: positive for dyspnea on exertion dry skin   PHYSICAL EXAMINATION: General appearance: alert, cooperative, fatigued and no distress Head: Normocephalic, without obvious abnormality, atraumatic Neck: no adenopathy Lymph nodes: Cervical, supraclavicular, and axillary nodes normal. Resp: clear to auscultation bilaterally Cardio: regular rate and rhythm, S1, S2 normal, no murmur, click, rub or gallop GI: soft, non-tender; bowel sounds normal; no masses,  no organomegaly Extremities: extremities normal, atraumatic, no cyanosis or edema Neurologic: Alert and oriented X 3, normal strength and tone. Normal symmetric reflexes. Normal coordination and gait Skin:  generally dry, no rash or areas of skin breakdown.  ECOG PERFORMANCE STATUS: 1 - Symptomatic but completely ambulatory  Blood pressure 100/56, pulse 125, temperature 98.1 F (36.7 C), temperature source Oral, resp. rate 18, height 5\' 2"  (1.575 m), weight 99 lb 4.8 oz (45.042 kg).  LABORATORY DATA: Lab Results  Component Value Date   WBC 8.0 10/06/2012   HGB 9.9* 10/06/2012   HCT 30.6* 10/06/2012   MCV 89.5 10/06/2012   PLT 455* 10/06/2012      Chemistry      Component Value Date/Time   NA 142 10/06/2012 1055   NA 137 08/30/2012 0355   K 3.8 10/06/2012 1055   K 3.7 08/30/2012 0355   CL 99 08/30/2012 0355   CL 102 08/18/2012 0938   CO2 28 10/06/2012 1055   CO2 31 08/30/2012 0355   BUN 14.9 10/06/2012 1055   BUN 10 08/30/2012 0355   CREATININE 0.9 10/06/2012 1055   CREATININE 0.63 08/30/2012 0355      Component Value Date/Time   CALCIUM 10.4 10/06/2012 1055   CALCIUM 8.9 08/30/2012 0355   ALKPHOS 98 10/06/2012 1055  ALKPHOS 97 08/30/2012 0355   AST 14 10/06/2012 1055   AST 10 08/30/2012 0355   ALT <6 Repeated and Verified 10/06/2012 1055   ALT <5 08/30/2012 0355   BILITOT 0.20 10/06/2012 1055   BILITOT 0.1* 08/30/2012 0355       RADIOGRAPHIC STUDIES: Dg Chest 2 View  09/01/2012   *RADIOLOGY REPORT*  Clinical Data: Postoperative chest x-ray status post pericardial window, cough  CHEST - 2 VIEW  Comparison: Prior chest x-ray 08/30/2012; CT scan 08/27/2012  Findings: The right IJ central venous catheter has been removed. The pericardial drain has also been removed.  Stable cardiopericardial silhouette.  There may be a small amount of pneumomediastinum.  Stable moderate left and small right pleural effusions with associated bibasilar atelectasis.  Right apical mass with adjacent postobstructive changes has not significantly changed in appearance compared to the recent prior radiographs. Incompletely imaged cervical spine stabilization hardware at C7-T1. No acute osseous abnormality.  IMPRESSION:  1.  Interval  removal of support apparatus as above without evidence of complication. 2.  Decreasing pneumomediastinum. 3.  Stable moderate left and small right pleural effusions with associated bibasilar atelectasis. 4.  Stable right apical mass and associated postobstructive changes.   Original Report Authenticated By: Malachy Moan, M.D.   Dg Chest 2 View  08/27/2012   *RADIOLOGY REPORT*  Clinical Data: 62 year old female with chest pain.  Abnormal chest CTA. Right lung cancer.  CHEST - 2 VIEW  Comparison: Chest CTA 0459 hours the same day and earlier.  Findings: Stable lung volumes.  Bilateral pleural effusions. Stable heterogeneous opacification in the right apex.  Stable cardiac size and mediastinal contours.  No pneumothorax or pulmonary edema.  Overall stable ventilation.  IMPRESSION: Stable ventilation.  Bilateral pleural effusions and mixed density in the right apex.  See chest CTA report from earlier today.   Original Report Authenticated By: Erskine Speed, M.D.   Ct Angio Chest Pe W/cm &/or Wo Cm  08/27/2012   *RADIOLOGY REPORT*  Clinical Data: Chest pain, shortness of breath.History of right lung cancer.  CT ANGIOGRAPHY CHEST  Technique:  Multidetector CT imaging of the chest using the standard protocol during bolus administration of intravenous contrast. Multiplanar reconstructed images including MIPs were obtained and reviewed to evaluate the vascular anatomy.  Contrast: 80mL OMNIPAQUE IOHEXOL 350 MG/ML SOLN  Comparison: Chest CT 08/22/2012  Findings: Again noted is the large right apical mass, approximately 6 cm in diameter.  This is unchanged since prior study.  Adjacent airspace disease may be related to postradiation changes or postobstructive process, unchanged as well.  Small bilateral pleural effusions, slight increased on the left since prior study. Large pericardial effusion, increased.  Heart is normal size.  No visible mediastinal, hilar or axillary adenopathy.  No filling defects in the pulmonary  arteries to suggest pulmonary emboli. Moderate COPD changes.  No confluent opacities on the left.  Imaging into the upper abdomen shows no acute findings.  No acute bony abnormality.  IMPRESSION: Stable right apical mass with adjacent airspace disease.  Small bilateral pleural effusions, slightly increased since prior study, particularly on the left.  Large pericardial effusion, increased.  No evidence of pulmonary embolus.   Original Report Authenticated By: Charlett Nose, M.D.   Dg Chest Port 1 View  08/30/2012   *RADIOLOGY REPORT*  Clinical Data: Chest tube  PORTABLE CHEST - 1 VIEW  Comparison: 08/29/2012  Findings: Stable left apical chest tube.  Stable pericardial tube. Stable left pleural effusion and basilar opacity.  Stable  right apical opacity with volume loss.  Normal heart size.  IMPRESSION: Stable.  No pneumothorax.   Original Report Authenticated By: Jolaine Click, M.D.   Dg Chest Port 1 View  08/29/2012   *RADIOLOGY REPORT*  Clinical Data: Chest tubes.  PORTABLE CHEST - 1 VIEW  Comparison: 08/28/2012  Findings: Left chest tube remains in place.  No visible pneumothorax currently.  Continued right apical density, unchanged. Small bilateral pleural effusions.  Bibasilar atelectasis.  Heart is normal size.  IMPRESSION: Slight increase in bibasilar atelectasis and small effusions.  No visible pneumothorax currently.   Original Report Authenticated By: Charlett Nose, M.D.   Dg Chest Portable 1 View  08/28/2012   *RADIOLOGY REPORT*  Clinical Data: Postop pericardial window, chest tube, metastatic lung cancer post radiation therapy, COPD  PORTABLE CHEST - 1 VIEW  Comparison: Portable exam 0937 hours compared to 08/28/2012  Findings: Right jugular central venous catheter with tip projecting over SVC. Enlargement of cardiac silhouette. Mediastinal drain and left thoracostomy tube newly identified. Changes of COPD with chronic opacification of the right upper lobe due to known mass and right upper lobe  atelectasis. Atelectasis versus infiltrate left lower lobe. Minimal left pleural effusion. Tiny left apex pneumothorax. Bones demineralized with evidence of prior cervical spine fusion.  IMPRESSION: Tiny left apex pneumothorax in patient with left thoracostomy tube. Enlargement of cardiac silhouette. Atelectasis versus consolidation left lower lobe with minimal left pleural effusion. Chronic opacification and volume loss of right upper lobe due to known mass and atelectasis.   Original Report Authenticated By: Ulyses Southward, M.D.   Dg Chest Port 1 View  08/28/2012   *RADIOLOGY REPORT*  Clinical Data: Shortness of breath, congestion.  PORTABLE CHEST - 1 VIEW  Comparison: 08/27/2012  Findings: Right apical mass/airspace density again noted, unchanged.  Small left pleural effusion, slightly enlarged.  Mild cardiomegaly.  IMPRESSION: Slight enlargement of left pleural effusion.  Otherwise no change.   Original Report Authenticated By: Charlett Nose, M.D.   Dg Chest Portable 1 View  08/27/2012   *RADIOLOGY REPORT*  Clinical Data: Chest pain, shortness of breath.  PORTABLE CHEST - 1 VIEW  Comparison: Chest CT 08/22/2012.  Findings: Large right apical mass again noted, grossly unchanged. Underlying COPD.  Cardiomegaly.  Small bilateral effusions.  No confluent opacity on the left.  No acute bony abnormality.  IMPRESSION: Stable large right apical mass.  Small bilateral effusions.  COPD.  Cardiomegaly.   Original Report Authenticated By: Charlett Nose, M.D.    ASSESSMENT AND PLAN: This is a very pleasant 62 years old white female with metastatic non-small cell lung cancer, adenocarcinoma status post 6 cycles of systemic chemotherapy was carboplatin and Alimta was evidence for disease progression after cycle #6. She is currently being treated with Tarceva 150 mg by mouth daily, status post approximately one week of therapy. The patient was discussed with and seen by [name]. She will continue taking Tarceva 150 mg by  mouth daily. She was advised to discontiue smoking and was given information on smoking cessation. She will follow up in 2 weeks for another symptom management visit with a repeat CBC Differential and CMET.  Laural Benes, Alecxander Mainwaring E, PA-C   The patient was advised to call immediately if she has any concerning symptoms in the interval. All questions were answered. The patient knows to call the clinic with any problems, questions or concerns. We can certainly see the patient much sooner if necessary.  ADDENDUM: Hematology/Oncology Attending: I have a face to face encounter with  the patient today. I recommended her care plan. The patient has metastatic non-small cell lung cancer, adenocarcinoma and she is currently undergoing second line chemotherapy with oral Tarceva 150 mg by mouth daily started one week ago. She is tolerating her treatment fairly well with no significant adverse effects. She has no significant skin rash or diarrhea. The patient continues to smoke and I strongly encouraged her to quit smoking and offered her smoke cessation program. She would come back for follow up visit in 2 weeks for reevaluation and management any adverse effect of her treatment. Lajuana Matte., MD 10/06/2012

## 2012-10-06 NOTE — Telephone Encounter (Signed)
gv and printed appt sched and avs for pt  °

## 2012-10-06 NOTE — Patient Instructions (Addendum)
Continue taking Taceva 150 mg by mouth daily Follow up in 2 weeks for another symptom management visit

## 2012-10-07 ENCOUNTER — Telehealth: Payer: Self-pay | Admitting: *Deleted

## 2012-10-07 ENCOUNTER — Other Ambulatory Visit: Payer: Self-pay | Admitting: *Deleted

## 2012-10-07 DIAGNOSIS — Z716 Tobacco abuse counseling: Secondary | ICD-10-CM | POA: Insufficient documentation

## 2012-10-07 NOTE — Telephone Encounter (Signed)
Spoke with pt today regarding smoking cessation.  Gave information on tips and tricks to quit smoking.

## 2012-10-09 ENCOUNTER — Telehealth: Payer: Self-pay | Admitting: Internal Medicine

## 2012-10-22 ENCOUNTER — Telehealth: Payer: Self-pay | Admitting: Internal Medicine

## 2012-10-22 ENCOUNTER — Encounter: Payer: Self-pay | Admitting: *Deleted

## 2012-10-22 ENCOUNTER — Ambulatory Visit (HOSPITAL_BASED_OUTPATIENT_CLINIC_OR_DEPARTMENT_OTHER): Payer: Medicaid Other | Admitting: Internal Medicine

## 2012-10-22 ENCOUNTER — Ambulatory Visit (HOSPITAL_COMMUNITY)
Admission: RE | Admit: 2012-10-22 | Discharge: 2012-10-22 | Disposition: A | Discharge status: Home / Self Care | Payer: Medicaid Other | Source: Ambulatory Visit | Attending: Cardiovascular Disease | Admitting: Cardiovascular Disease

## 2012-10-22 ENCOUNTER — Encounter: Payer: Self-pay | Admitting: Internal Medicine

## 2012-10-22 ENCOUNTER — Other Ambulatory Visit (HOSPITAL_BASED_OUTPATIENT_CLINIC_OR_DEPARTMENT_OTHER): Payer: Medicaid Other | Admitting: Lab

## 2012-10-22 DIAGNOSIS — R0989 Other specified symptoms and signs involving the circulatory and respiratory systems: Secondary | ICD-10-CM | POA: Insufficient documentation

## 2012-10-22 DIAGNOSIS — R0609 Other forms of dyspnea: Secondary | ICD-10-CM | POA: Insufficient documentation

## 2012-10-22 DIAGNOSIS — F17200 Nicotine dependence, unspecified, uncomplicated: Secondary | ICD-10-CM

## 2012-10-22 DIAGNOSIS — R079 Chest pain, unspecified: Secondary | ICD-10-CM | POA: Insufficient documentation

## 2012-10-22 DIAGNOSIS — C3410 Malignant neoplasm of upper lobe, unspecified bronchus or lung: Secondary | ICD-10-CM

## 2012-10-22 DIAGNOSIS — J449 Chronic obstructive pulmonary disease, unspecified: Secondary | ICD-10-CM | POA: Insufficient documentation

## 2012-10-22 DIAGNOSIS — J4489 Other specified chronic obstructive pulmonary disease: Secondary | ICD-10-CM | POA: Insufficient documentation

## 2012-10-22 DIAGNOSIS — M25519 Pain in unspecified shoulder: Secondary | ICD-10-CM

## 2012-10-22 DIAGNOSIS — I079 Rheumatic tricuspid valve disease, unspecified: Secondary | ICD-10-CM | POA: Insufficient documentation

## 2012-10-22 DIAGNOSIS — I319 Disease of pericardium, unspecified: Secondary | ICD-10-CM | POA: Insufficient documentation

## 2012-10-22 DIAGNOSIS — C7931 Secondary malignant neoplasm of brain: Secondary | ICD-10-CM

## 2012-10-22 DIAGNOSIS — R000 Tachycardia, unspecified: Secondary | ICD-10-CM

## 2012-10-22 LAB — COMPREHENSIVE METABOLIC PANEL (CC13)
ALT: <6 U/L (ref 0–55)
AST: 10 U/L (ref 5–34)
BUN: 11.7 mg/dL (ref 7.0–26.0)
Calcium: 10.4 mg/dL (ref 8.4–10.4)
Chloride: 103 mEq/L (ref 98–109)
Creatinine: 0.7 mg/dL (ref 0.6–1.1)
Total Bilirubin: 0.27 mg/dL (ref 0.20–1.20)

## 2012-10-22 LAB — CBC WITH DIFFERENTIAL/PLATELET
BASO%: 1 % (ref 0.0–2.0)
Basophils Absolute: 0.1 10*3/uL (ref 0.0–0.1)
EOS%: 2.5 % (ref 0.0–7.0)
HCT: 31.9 % — ABNORMAL LOW (ref 34.8–46.6)
HGB: 10.4 g/dL — ABNORMAL LOW (ref 11.6–15.9)
LYMPH%: 8.3 % — ABNORMAL LOW (ref 14.0–49.7)
MCH: 28.6 pg (ref 25.1–34.0)
MCHC: 32.5 g/dL (ref 31.5–36.0)
MCV: 88 fL (ref 79.5–101.0)
MONO%: 7.4 % (ref 0.0–14.0)
NEUT%: 80.8 % — ABNORMAL HIGH (ref 38.4–76.8)
Platelets: 402 10*3/uL — ABNORMAL HIGH (ref 145–400)
lymph#: 0.7 10*3/uL — ABNORMAL LOW (ref 0.9–3.3)

## 2012-10-22 MED ORDER — ERLOTINIB HCL 150 MG PO TABS: 150.0000 mg | ORAL_TABLET | Freq: Every day | ORAL | Status: DC

## 2012-10-22 NOTE — Progress Notes (Signed)
[facility] Telephone:(336) [phone]   Fax:(336) [phone]  OFFICE PROGRESS NOTE  [name], MD [address] [place] [address]  DIAGNOSIS: Metastatic non-small cell lung cancer, adenocarcinoma with negative EGFR mutation and negative ALK gene translocation diagnosed in September of 2013 with metastatic single brain lesion.   PRIOR THERAPY:  1) Status post stereotactic radiosurgery to the single brain lesion under the care Dr. Kathrynn Running.  2) Concurrent chemoradiation with chemotherapy in the form of weekly carboplatin for an AUC of 2 and paclitaxel at 45 mg per meter squared concurrent with radiation therapy under the care Dr. Kathrynn Running with partial response in her disease.  3) Systemic chemotherapy with carboplatin for AUC of 5 and Alimta 500 mg/M2 every 3 weeks. From cycle 3 forward she is receiving carboplatin for an AUC of 4 and Alimta at 375 mg per meter squared due to thrombocytopenia. Status post a total of 6 cycles with evidence for disease progression.  4) Subxiphoid pericardial window for drainage of pericardial effusion under the care of Dr. Donata Clay on 08/28/2012 and the final pathology showed no malignant cells in the pleural fluid.   CURRENT THERAPY: Tarceva 150 mg by mouth daily. Therapy starting 09/30/2012   CHEMOTHERAPY INTENT: Palliative  CURRENT # OF CHEMOTHERAPY CYCLES: 3 week of Tarceva  CURRENT ANTIEMETICS: None  CURRENT SMOKING STATUS: Current smoker  ORAL CHEMOTHERAPY AND CONSENT: yes  CURRENT BISPHOSPHONATES USE: none  PAIN MANAGEMENT: Percocet  NARCOTICS INDUCED CONSTIPATION: none  LIVING WILL AND CODE STATUS: No CODE BLUE.   INTERVAL HISTORY: Melanie Liu 62 y.o. female returns to the clinic today for followup visit accompanied her daughter. The patient is tolerating her treatment with Tarceva fairly well except for mild skin rash. The patient denied having any significant weight loss or night sweats. She denied having any  nausea or vomiting. She has no chest pain except for right shoulder pain, shortness of breath, cough or hemoptysis. She rates her pain as 8/10 but improved with oxycodone down to 3/10. Unfortunately the patient continues to smoke and I strongly encouraged her to quit smoking.   MEDICAL HISTORY: Past Medical History  Diagnosis Date  . Nodule of right lung     right upper lobe  . Anemia   . Rectovaginal fistula   . Right frontal lobe lesion 12/13/11    CT head  . History of radiation therapy 12/24/11-02/08/12    rul 66Gy/3fxs  . Dysphagia   . History of radiation therapy 12/27/11    SRS rt ant frontal 20gy  . Lung cancer 11/23/11    biopsy-right  . Brain metastases   . Chest pain at rest 08/26/2012  . COPD (chronic obstructive pulmonary disease)   . Shortness of breath     ALLERGIES:  is allergic to carboplatin and levaquin.  MEDICATIONS:  Current Outpatient Prescriptions  Medication Sig Dispense Refill  . albuterol (PROVENTIL) (5 MG/ML) 0.5% nebulizer solution Take 2.5 mg by nebulization every 6 (six) hours as needed for wheezing.      . erlotinib (TARCEVA) 150 MG tablet Take 150 mg by mouth daily. Take on an empty stomach 1 hour before meals or 2 hours after.      . folic acid (FOLVITE) 1 MG tablet Take 1 mg by mouth daily.      [name] ipratropium (ATROVENT) 0.02 % nebulizer solution Take 500 mcg by nebulization every 4 (four) hours as needed for wheezing.      [name] oxyCODONE-acetaminophen (PERCOCET/ROXICET) 5-325  MG per tablet Take 1 tablet by mouth every 4 (four) hours as needed for pain.      [name] sucralfate (CARAFATE) 1 G tablet Take 1 g by mouth daily.       No current facility-administered medications for this visit.    SURGICAL HISTORY:  Past Surgical History  Procedure Laterality Date  . Partial hysterectomy    . Hemmorhoid surgery    . Neck surgery    . Retinal detachment surgery    . Esophagogastroduodenoscopy N/A 05/01/2012    Procedure: ESOPHAGOGASTRODUODENOSCOPY (EGD);   Surgeon: Shirley Friar, MD;  Location: [facility];  Service: Endoscopy;  Laterality: N/A;  . Subxyphoid pericardial window N/A 08/28/2012    Procedure: SUBXYPHOID PERICARDIAL WINDOW;  Surgeon: Kerin Perna, MD;  Location: [facility] OR;  Service: Thoracic;  Laterality: N/A;  . Tee without cardioversion  08/28/2012    Procedure: TRANSESOPHAGEAL ECHOCARDIOGRAM (TEE);  Surgeon: Kerin Perna, MD;  Location: [facility] OR;  Service: Thoracic;;  . Eye surgery    . Tubal ligation    . Mass excision Right 09/26/2012    Procedure: EXCISION MASS;  Surgeon: Kerin Perna, MD;  Location: [facility] OR;  Service: Vascular;  Laterality: Right;  EXCISION OF RIGHT NECK  MASS    REVIEW OF SYSTEMS:  A comprehensive review of systems was negative except for: Constitutional: positive for fatigue Musculoskeletal: positive for Right shoulder pain   PHYSICAL EXAMINATION: General appearance: alert, cooperative, fatigued and no distress Head: Normocephalic, without obvious abnormality, atraumatic Neck: no adenopathy Lymph nodes: Cervical, supraclavicular, and axillary nodes normal. Resp: clear to auscultation bilaterally Cardio: regular rate and rhythm, S1, S2 normal, no murmur, click, rub or gallop GI: soft, non-tender; bowel sounds normal; no masses,  no organomegaly Extremities: extremities normal, atraumatic, no cyanosis or edema  ECOG PERFORMANCE STATUS: 1 - Symptomatic but completely ambulatory  Blood pressure 99/63, pulse 117, temperature 98.3 F (36.8 C), temperature source Oral, resp. rate 20, height 5\' 2"  (1.575 m), weight 102 lb 6.4 oz (46.448 kg).  LABORATORY DATA: Lab Results  Component Value Date   WBC 8.3 10/22/2012   HGB 10.4* 10/22/2012   HCT 31.9* 10/22/2012   MCV 88.0 10/22/2012   PLT 402* 10/22/2012      Chemistry      Component Value Date/Time   NA 142 10/06/2012 1055   NA 137 08/30/2012 0355   K 3.8 10/06/2012 1055   K 3.7 08/30/2012 0355   CL 99 08/30/2012 0355   CL 102 08/18/2012 0938   CO2 28  10/06/2012 1055   CO2 31 08/30/2012 0355   BUN 14.9 10/06/2012 1055   BUN 10 08/30/2012 0355   CREATININE 0.9 10/06/2012 1055   CREATININE 0.63 08/30/2012 0355      Component Value Date/Time   CALCIUM 10.4 10/06/2012 1055   CALCIUM 8.9 08/30/2012 0355   ALKPHOS 98 10/06/2012 1055   ALKPHOS 97 08/30/2012 0355   AST 14 10/06/2012 1055   AST 10 08/30/2012 0355   ALT <6 Repeated and Verified 10/06/2012 1055   ALT <5 08/30/2012 0355   BILITOT 0.20 10/06/2012 1055   BILITOT 0.1* 08/30/2012 0355       RADIOGRAPHIC STUDIES: Dg Chest 2 View  09/24/2012   *RADIOLOGY REPORT*  Clinical Data: Follow-up pericardial effusion status post sub xyphoid pericardial window.  CHEST - 2 VIEW  Comparison: Radiographs 09/01/2012.  CT 08/27/2012.  Findings: The heart size is stable without evidence of recurrent pericardial effusion.  There is stable mediastinal  shift to the right and right apical pleural parenchymal density.  The left lung is clear.  There is no significant residual pleural effusion.  A retrosternal lucency on the lateral view has no definite correlative finding on the frontal examination and is probably related to rotation.  I do not see definite findings to suggest an anterior pneumothorax or pneumomediastinum.  IMPRESSION:  1.  Stable heart size without evidence of recurrent pericardial effusion. 2.  Resolved pleural effusions.   Original Report Authenticated By: Carey Bullocks, M.D.    ASSESSMENT AND PLAN: This is a very pleasant 62 years old white female with metastatic non-small cell lung cancer, adenocarcinoma currently undergoing treatment with oral Tarceva status post 3 weeks of treatment and tolerating her treatment fairly well. She has no significant skin rash or diarrhea. I recommend for the patient to continue her current treatment with Tarceva with the same dose. For the right shoulder pain the patient will continue on her current pain medication with oxycodone and she was advised to call immediately if  her pain is getting worse or not controlled with her current pain medications. She would come back for followup visit in one month. The patient voices understanding of current disease status and treatment options and is in agreement with the current care plan.  All questions were answered. The patient knows to call the clinic with any problems, questions or concerns. We can certainly see the patient much sooner if necessary.

## 2012-10-22 NOTE — Progress Notes (Signed)
10/22/2012 Patient in to clinic today for routine follow-up visit, currently on oral therapy with Tarceva. Upon arrival the patient completed the Patient Reported Outcomes (PROs) independently, including the EQ-5D-3L followed by the Lung Cancer Symptom Scale (LCSS), prior to her lab and MD appointments. Following completion, patient identifiers were added to the EQ-5D-3L questionnaire. Thanked patient for her participation. Noted that research blood samples will be collected today, in addition to her standard of care lab samples, and that her next anticipated questionnaires will be completed in approximately one month. Cindy S. Clelia Croft BSN, RN, CCRP 10/22/2012 11:07 AM

## 2012-10-22 NOTE — Telephone Encounter (Signed)
Gave pt appt for lab and ML for August and September 2014

## 2012-10-22 NOTE — Progress Notes (Signed)
2D Echo Performed 10/22/2012    Ralynn San, RCS  

## 2012-10-22 NOTE — Patient Instructions (Signed)
CURRENT THERAPY: Tarceva 150 mg by mouth daily. Therapy starting 09/30/2012  CHEMOTHERAPY INTENT: Palliative  CURRENT # OF CHEMOTHERAPY CYCLES: 3 week of Tarceva  CURRENT ANTIEMETICS: None  CURRENT SMOKING STATUS: Current smoker  ORAL CHEMOTHERAPY AND CONSENT: yes  CURRENT BISPHOSPHONATES USE: none  PAIN MANAGEMENT: Percocet  NARCOTICS INDUCED CONSTIPATION: none  LIVING WILL AND CODE STATUS: No CODE BLUE.

## 2012-10-28 ENCOUNTER — Other Ambulatory Visit: Payer: Medicaid Other | Admitting: Lab

## 2012-10-28 ENCOUNTER — Ambulatory Visit: Payer: Medicaid Other | Admitting: Internal Medicine

## 2012-10-28 ENCOUNTER — Ambulatory Visit
Admission: RE | Admit: 2012-10-28 | Discharge: 2012-10-28 | Disposition: A | Discharge status: Home / Self Care | Payer: Medicaid Other | Source: Ambulatory Visit | Attending: Radiation Oncology | Admitting: Radiation Oncology

## 2012-10-28 ENCOUNTER — Encounter: Payer: Self-pay | Admitting: Radiation Oncology

## 2012-10-28 DIAGNOSIS — C7931 Secondary malignant neoplasm of brain: Secondary | ICD-10-CM

## 2012-10-28 MED ORDER — GADOBENATE DIMEGLUMINE 529 MG/ML IV SOLN
10.0000 mL | Freq: Once | INTRAVENOUS | Status: AC | PRN
  Administered 2012-10-28: 10 mL via INTRAVENOUS

## 2012-10-28 NOTE — Progress Notes (Signed)
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 She received 66 Gy in 33 fractions to the primary tumor in the right upper lobe of the lung 12/24/2011-02/08/2012  3. Stereotactic radiosurgery to her new Lt Frontal 5 mm and Rt Parietal 18 mm mets 04/14/2012  4. SRS to a 4.8 mm right parietal brain metastasis to a prescription dose of 20 Gy.   Interval Since Last Radiation:  3  months  Narrative:  The patient returns today for routine follow-up with myself and Dr. Venetia Maxon from neurosurgery.  The recent films were presented in our multidisciplinary conference with neuroradiology just prior to the clinic.  She has ongoing pain in right arm after right chest radiation.                              ALLERGIES:  is allergic to carboplatin and levaquin.  Meds: Current Outpatient Prescriptions  Medication Sig Dispense Refill  . albuterol (PROVENTIL) (5 MG/ML) 0.5% nebulizer solution Take 2.5 mg by nebulization every 6 (six) hours as needed for wheezing.      . erlotinib (TARCEVA) 150 MG tablet Take 1 tablet (150 mg total) by mouth daily. Take on an empty stomach 1 hour before meals or 2 hours after.  30 tablet  1  . folic acid (FOLVITE) 1 MG tablet Take 1 mg by mouth daily.      [name] ipratropium (ATROVENT) 0.02 % nebulizer solution Take 500 mcg by nebulization every 4 (four) hours as needed for wheezing.      [name] oxyCODONE-acetaminophen (PERCOCET/ROXICET) 5-325 MG per tablet Take 1 tablet by mouth every 4 (four) hours as needed for pain.      [name] sucralfate (CARAFATE) 1 G tablet Take 1 g by mouth daily.       No current  facility-administered medications for this encounter.    Physical Findings: The patient is in no acute distress. Patient is alert and oriented.  vitals were not taken for this visit..  No significant changes.  Lab Findings: Lab Results  Component Value Date   WBC 8.3 10/22/2012   HGB 10.4* 10/22/2012   HCT 31.9* 10/22/2012   MCV 88.0 10/22/2012   PLT 402* 10/22/2012    @LASTCHEM @  Radiographic Findings: Mr Lodema Pilot Contrast  10/28/2012   CLINICAL DATA:  62 year old female with metastatic lung cancer. Stereotactic radiosurgery treatment, restaging.  EXAM: MRI HEAD WITHOUT AND WITH CONTRAST  TECHNIQUE: Multiplanar, multiecho pulse sequences of the brain and surrounding structures were obtained according to standard protocol without and with intravenous contrast  CONTRAST:  10mL MULTIHANCE GADOBENATE DIMEGLUMINE 529 MG/ML IV SOLN  COMPARISON:  07/18/2012 and earlier.  FINDINGS: Progression of size, enhancement, and surrounding edema at the right parietal and posterior frontal/ temporal metastases. The left parietal lesion area of enhancement is increased to 14 x 15 mm (previously 6 x 9 mm), while the left posterior frontal/ temporal lesion enhancement is increased to 12 x 13 mm (previously 5 mm diameter. Surrounding edema at both levels has significantly progressed and is moderate to severe. Notably,  both lesions now also show central restricted diffusion.  There is associated mass effect on the right lateral ventricle. No ventriculomegaly or midline shift.  At the same time they small left superior frontal cortically based metastasis is no longer visible (series 10, image 122). And a and inferior right frontal lobe enhancing metastasis is stable measuring approximately 6 mm (image 70).  No new cerebral metastasis identified.  No restricted diffusion or evidence of acute infarction. Ventricular size and configuration are within normal limits. No acute intracranial hemorrhage identified. Negative  pituitary, cervicomedullary junction and visualized cervical spinal cord. Major intracranial vascular flow voids are stable. Normal bone marrow signal. Stable orbits soft tissues, paranasal sinuses and mastoids. Negative scalp soft tissues.  IMPRESSION: 1. Progressed enhancement of the right parietal and right posterior frontal/ temporal metastases, each with moderate to severely increased cerebral edema. MRI characteristics are most suggestive of post radiation treatment effect. True progression of disease is less likely.  2. Stable smaller right anterior inferior frontal metastasis. Small left superior frontal gyrus metastasis is no longer enhancing.  3.  No new metastasis.  These results will be called to the ordering clinician or representative by the Radiologist Assistant, and communication documented in the PACS Dashboard.   Electronically Signed   By: Augusto Gamble   On: 10/28/2012 11:07    Impression:  The patient is recovering from the effects of radiation.  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  Plan:  Today, she was given refill of Percocet, new Lyrica sample, and Trental with Vitamin E.  Repeat MRI in 3 months.  _____________________________________  Artist Pais. Kathrynn Running, M.D. and  Maeola Harman, M.D.

## 2012-10-29 ENCOUNTER — Ambulatory Visit
Admission: RE | Admit: 2012-10-29 | Discharge: 2012-10-29 | Disposition: A | Discharge status: Home / Self Care | Payer: Medicaid Other | Source: Ambulatory Visit | Attending: Radiation Oncology | Admitting: Radiation Oncology

## 2012-10-29 DIAGNOSIS — C7931 Secondary malignant neoplasm of brain: Secondary | ICD-10-CM

## 2012-10-29 MED ORDER — OXYCODONE-ACETAMINOPHEN 5-325 MG PO TABS: 1.0000 | ORAL_TABLET | ORAL | Status: DC | PRN

## 2012-10-29 MED ORDER — VITAMIN E 180 MG (400 UNIT) PO CAPS: 400.0000 [IU] | ORAL_CAPSULE | Freq: Two times a day (BID) | ORAL | Status: AC

## 2012-10-29 MED ORDER — PENTOXIFYLLINE ER 400 MG PO TBCR: 400.0000 mg | EXTENDED_RELEASE_TABLET | Freq: Two times a day (BID) | ORAL | Status: DC

## 2012-11-06 ENCOUNTER — Encounter: Payer: Self-pay | Admitting: *Deleted

## 2012-11-06 NOTE — Progress Notes (Signed)
Quick Note:    Letter sent to patient.  ______

## 2012-11-07 ENCOUNTER — Other Ambulatory Visit: Payer: Self-pay | Admitting: Radiation Therapy

## 2012-11-07 DIAGNOSIS — C7931 Secondary malignant neoplasm of brain: Secondary | ICD-10-CM

## 2012-11-20 ENCOUNTER — Inpatient Hospital Stay (HOSPITAL_COMMUNITY)
Admission: EM | Admit: 2012-11-20 | Discharge: 2012-11-21 | DRG: 054 | Disposition: A | Discharge status: Home / Self Care | Payer: Medicaid Other | Attending: Family Medicine | Admitting: Family Medicine

## 2012-11-20 ENCOUNTER — Telehealth: Payer: Self-pay | Admitting: Internal Medicine

## 2012-11-20 ENCOUNTER — Emergency Department (HOSPITAL_COMMUNITY): Payer: Medicaid Other

## 2012-11-20 ENCOUNTER — Inpatient Hospital Stay (HOSPITAL_COMMUNITY): Payer: Medicaid Other

## 2012-11-20 ENCOUNTER — Other Ambulatory Visit: Payer: Medicaid Other

## 2012-11-20 ENCOUNTER — Ambulatory Visit: Payer: Medicaid Other | Admitting: Physician Assistant

## 2012-11-20 ENCOUNTER — Emergency Department (HOSPITAL_COMMUNITY): Admission: EM | Admit: 2012-11-20 | Payer: Medicaid Other | Source: Home / Self Care

## 2012-11-20 ENCOUNTER — Encounter (HOSPITAL_COMMUNITY): Payer: Self-pay | Admitting: Radiology

## 2012-11-20 DIAGNOSIS — M6281 Muscle weakness (generalized): Secondary | ICD-10-CM

## 2012-11-20 DIAGNOSIS — K222 Esophageal obstruction: Secondary | ICD-10-CM

## 2012-11-20 DIAGNOSIS — I3139 Other pericardial effusion (noninflammatory): Secondary | ICD-10-CM

## 2012-11-20 DIAGNOSIS — G8194 Hemiplegia, unspecified affecting left nondominant side: Secondary | ICD-10-CM

## 2012-11-20 DIAGNOSIS — J4489 Other specified chronic obstructive pulmonary disease: Secondary | ICD-10-CM | POA: Diagnosis present

## 2012-11-20 DIAGNOSIS — J189 Pneumonia, unspecified organism: Secondary | ICD-10-CM

## 2012-11-20 DIAGNOSIS — R1310 Dysphagia, unspecified: Secondary | ICD-10-CM

## 2012-11-20 DIAGNOSIS — D696 Thrombocytopenia, unspecified: Secondary | ICD-10-CM

## 2012-11-20 DIAGNOSIS — F17200 Nicotine dependence, unspecified, uncomplicated: Secondary | ICD-10-CM | POA: Diagnosis present

## 2012-11-20 DIAGNOSIS — C3490 Malignant neoplasm of unspecified part of unspecified bronchus or lung: Secondary | ICD-10-CM | POA: Diagnosis present

## 2012-11-20 DIAGNOSIS — E440 Moderate protein-calorie malnutrition: Secondary | ICD-10-CM | POA: Diagnosis present

## 2012-11-20 DIAGNOSIS — J441 Chronic obstructive pulmonary disease with (acute) exacerbation: Secondary | ICD-10-CM

## 2012-11-20 DIAGNOSIS — Z720 Tobacco use: Secondary | ICD-10-CM

## 2012-11-20 DIAGNOSIS — Z716 Tobacco abuse counseling: Secondary | ICD-10-CM

## 2012-11-20 DIAGNOSIS — K209 Esophagitis, unspecified without bleeding: Secondary | ICD-10-CM

## 2012-11-20 DIAGNOSIS — G8389 Other specified paralytic syndromes: Secondary | ICD-10-CM | POA: Diagnosis present

## 2012-11-20 DIAGNOSIS — R569 Unspecified convulsions: Secondary | ICD-10-CM

## 2012-11-20 DIAGNOSIS — G939 Disorder of brain, unspecified: Secondary | ICD-10-CM

## 2012-11-20 DIAGNOSIS — Z681 Body mass index (BMI) 19 or less, adult: Secondary | ICD-10-CM

## 2012-11-20 DIAGNOSIS — J449 Chronic obstructive pulmonary disease, unspecified: Secondary | ICD-10-CM

## 2012-11-20 DIAGNOSIS — G936 Cerebral edema: Secondary | ICD-10-CM | POA: Diagnosis present

## 2012-11-20 DIAGNOSIS — C7931 Secondary malignant neoplasm of brain: Principal | ICD-10-CM | POA: Diagnosis present

## 2012-11-20 DIAGNOSIS — C3410 Malignant neoplasm of upper lobe, unspecified bronchus or lung: Secondary | ICD-10-CM | POA: Diagnosis present

## 2012-11-20 DIAGNOSIS — Z923 Personal history of irradiation: Secondary | ICD-10-CM

## 2012-11-20 DIAGNOSIS — R531 Weakness: Secondary | ICD-10-CM | POA: Diagnosis present

## 2012-11-20 DIAGNOSIS — R918 Other nonspecific abnormal finding of lung field: Secondary | ICD-10-CM

## 2012-11-20 DIAGNOSIS — Z79899 Other long term (current) drug therapy: Secondary | ICD-10-CM

## 2012-11-20 DIAGNOSIS — R079 Chest pain, unspecified: Secondary | ICD-10-CM

## 2012-11-20 DIAGNOSIS — I313 Pericardial effusion (noninflammatory): Secondary | ICD-10-CM

## 2012-11-20 LAB — COMPREHENSIVE METABOLIC PANEL
AST: 15 U/L (ref 0–37)
BUN: 17 mg/dL (ref 6–23)
CO2: 24 mEq/L (ref 19–32)
Calcium: 10.2 mg/dL (ref 8.4–10.5)
Chloride: 100 mEq/L (ref 96–112)
Creatinine, Ser: 0.89 mg/dL (ref 0.50–1.10)
GFR calc Af Amer: 80 mL/min — ABNORMAL LOW (ref >90)
GFR calc non Af Amer: 69 mL/min — ABNORMAL LOW (ref >90)
Glucose, Bld: 100 mg/dL — ABNORMAL HIGH (ref 70–99)
Total Bilirubin: 0.2 mg/dL — ABNORMAL LOW (ref 0.3–1.2)

## 2012-11-20 LAB — DIFFERENTIAL
Basophils Absolute: 0 10*3/uL (ref 0.0–0.1)
Eosinophils Relative: 5 % (ref 0–5)
Lymphocytes Relative: 9 % — ABNORMAL LOW (ref 12–46)
Monocytes Absolute: 0.8 10*3/uL (ref 0.1–1.0)
Monocytes Relative: 10 % (ref 3–12)

## 2012-11-20 LAB — GLUCOSE, CAPILLARY: Glucose-Capillary: 107 mg/dL — ABNORMAL HIGH (ref 70–99)

## 2012-11-20 LAB — POCT I-STAT, CHEM 8
BUN: 18 mg/dL (ref 6–23)
Creatinine, Ser: 1 mg/dL (ref 0.50–1.10)
Glucose, Bld: 108 mg/dL — ABNORMAL HIGH (ref 70–99)
Hemoglobin: 11.9 g/dL — ABNORMAL LOW (ref 12.0–15.0)
Potassium: 3.8 mEq/L (ref 3.5–5.1)

## 2012-11-20 LAB — BASIC METABOLIC PANEL
BUN: 16 mg/dL (ref 6–23)
GFR calc Af Amer: >90 mL/min (ref >90)
GFR calc non Af Amer: >90 mL/min (ref >90)
Potassium: 4 mEq/L (ref 3.5–5.1)
Sodium: 135 mEq/L (ref 135–145)

## 2012-11-20 LAB — URINALYSIS, ROUTINE W REFLEX MICROSCOPIC
Bilirubin Urine: NEGATIVE
Hgb urine dipstick: NEGATIVE
Specific Gravity, Urine: 1.024 (ref 1.005–1.030)
pH: 6 (ref 5.0–8.0)

## 2012-11-20 LAB — CBC
HCT: 34.8 % — ABNORMAL LOW (ref 36.0–46.0)
Hemoglobin: 11.1 g/dL — ABNORMAL LOW (ref 12.0–15.0)
MCV: 88.8 fL (ref 78.0–100.0)
RDW: 16.5 % — ABNORMAL HIGH (ref 11.5–15.5)
WBC: 8.7 10*3/uL (ref 4.0–10.5)

## 2012-11-20 LAB — RAPID URINE DRUG SCREEN, HOSP PERFORMED
Cocaine: NOT DETECTED
Opiates: NOT DETECTED

## 2012-11-20 LAB — CBC WITH DIFFERENTIAL/PLATELET
Basophils Absolute: 0 10*3/uL (ref 0.0–0.1)
Basophils Relative: 0 % (ref 0–1)
Hemoglobin: 11.1 g/dL — ABNORMAL LOW (ref 12.0–15.0)
MCHC: 31.4 g/dL (ref 30.0–36.0)
Monocytes Relative: 1 % — ABNORMAL LOW (ref 3–12)
Neutro Abs: 8 10*3/uL — ABNORMAL HIGH (ref 1.7–7.7)
Neutrophils Relative %: 94 % — ABNORMAL HIGH (ref 43–77)
Platelets: 336 10*3/uL (ref 150–400)

## 2012-11-20 LAB — TROPONIN I: Troponin I: <0.3 ng/mL (ref <0.30)

## 2012-11-20 LAB — ETHANOL: Alcohol, Ethyl (B): <11 mg/dL (ref 0–11)

## 2012-11-20 LAB — HEMOGLOBIN A1C: Hgb A1c MFr Bld: 5.2 % (ref <5.7)

## 2012-11-20 MED ORDER — IPRATROPIUM BROMIDE 0.02 % IN SOLN: 0.5000 mg | RESPIRATORY_TRACT | Status: DC | PRN

## 2012-11-20 MED ORDER — ALBUTEROL SULFATE (5 MG/ML) 0.5% IN NEBU: 2.5000 mg | INHALATION_SOLUTION | Freq: Four times a day (QID) | RESPIRATORY_TRACT | Status: DC | PRN

## 2012-11-20 MED ORDER — ERLOTINIB HCL 150 MG PO TABS
150.0000 mg | ORAL_TABLET | Freq: Every day | ORAL | Status: DC
  Administered 2012-11-20 – 2012-11-21 (×2): 150 mg via ORAL
  Filled 2012-11-20 (×2): qty 1

## 2012-11-20 MED ORDER — NICOTINE 14 MG/24HR TD PT24
14.0000 mg | MEDICATED_PATCH | Freq: Every day | TRANSDERMAL | Status: DC
  Administered 2012-11-20 – 2012-11-21 (×2): 14 mg via TRANSDERMAL
  Filled 2012-11-20 (×2): qty 1

## 2012-11-20 MED ORDER — LEVETIRACETAM 500 MG PO TABS
500.0000 mg | ORAL_TABLET | Freq: Two times a day (BID) | ORAL | Status: DC
  Administered 2012-11-20 – 2012-11-21 (×3): 500 mg via ORAL
  Filled 2012-11-20 (×5): qty 1

## 2012-11-20 MED ORDER — IPRATROPIUM BROMIDE 0.02 % IN SOLN: 500.0000 ug | RESPIRATORY_TRACT | Status: DC | PRN

## 2012-11-20 MED ORDER — PROCHLORPERAZINE MALEATE 10 MG PO TABS
10.0000 mg | ORAL_TABLET | Freq: Four times a day (QID) | ORAL | Status: DC | PRN
  Filled 2012-11-20: qty 1

## 2012-11-20 MED ORDER — DEXAMETHASONE SODIUM PHOSPHATE 4 MG/ML IJ SOLN
4.0000 mg | Freq: Two times a day (BID) | INTRAMUSCULAR | Status: DC
  Administered 2012-11-20 – 2012-11-21 (×3): 4 mg via INTRAVENOUS
  Filled 2012-11-20 (×4): qty 1

## 2012-11-20 MED ORDER — ENOXAPARIN SODIUM 40 MG/0.4ML ~~LOC~~ SOLN
40.0000 mg | SUBCUTANEOUS | Status: DC
  Administered 2012-11-20 – 2012-11-21 (×2): 40 mg via SUBCUTANEOUS
  Filled 2012-11-20 (×3): qty 0.4

## 2012-11-20 MED ORDER — PANTOPRAZOLE SODIUM 40 MG IV SOLR: 40.0000 mg | INTRAVENOUS | Status: DC

## 2012-11-20 MED ORDER — OXYCODONE-ACETAMINOPHEN 5-325 MG PO TABS
1.0000 | ORAL_TABLET | ORAL | Status: DC | PRN
  Administered 2012-11-20 (×3): 1 via ORAL
  Filled 2012-11-20 (×3): qty 1

## 2012-11-20 MED ORDER — LEVETIRACETAM 500 MG/5ML IV SOLN
1000.0000 mg | Freq: Once | INTRAVENOUS | Status: AC
  Administered 2012-11-20: 1000 mg via INTRAVENOUS
  Filled 2012-11-20: qty 10

## 2012-11-20 MED ORDER — SUCRALFATE 1 G PO TABS
1.0000 g | ORAL_TABLET | Freq: Every day | ORAL | Status: DC
  Administered 2012-11-20 – 2012-11-21 (×2): 1 g via ORAL
  Filled 2012-11-20 (×2): qty 1

## 2012-11-20 MED ORDER — PROMETHAZINE HCL 25 MG PO TABS: 25.0000 mg | ORAL_TABLET | Freq: Four times a day (QID) | ORAL | Status: DC | PRN

## 2012-11-20 MED ORDER — DEXAMETHASONE SODIUM PHOSPHATE 4 MG/ML IJ SOLN
4.0000 mg | Freq: Once | INTRAMUSCULAR | Status: AC
  Administered 2012-11-20: 4 mg via INTRAVENOUS
  Filled 2012-11-20: qty 1

## 2012-11-20 MED ORDER — FOLIC ACID 1 MG PO TABS
1.0000 mg | ORAL_TABLET | Freq: Every day | ORAL | Status: DC
  Administered 2012-11-20 – 2012-11-21 (×2): 1 mg via ORAL
  Filled 2012-11-20 (×2): qty 1

## 2012-11-20 MED ORDER — ACETAMINOPHEN 325 MG PO TABS: 650.0000 mg | ORAL_TABLET | ORAL | Status: DC | PRN

## 2012-11-20 MED ORDER — SODIUM CHLORIDE 0.9 % IV SOLN: INTRAVENOUS | Status: DC

## 2012-11-20 MED ORDER — FAMOTIDINE 20 MG PO TABS
20.0000 mg | ORAL_TABLET | Freq: Every day | ORAL | Status: DC
  Administered 2012-11-20 – 2012-11-21 (×2): 20 mg via ORAL
  Filled 2012-11-20 (×2): qty 1

## 2012-11-20 MED ORDER — PENTOXIFYLLINE ER 400 MG PO TBCR
400.0000 mg | EXTENDED_RELEASE_TABLET | Freq: Two times a day (BID) | ORAL | Status: DC
  Administered 2012-11-20 – 2012-11-21 (×3): 400 mg via ORAL
  Filled 2012-11-20 (×5): qty 1

## 2012-11-20 NOTE — ED Provider Notes (Signed)
CSN: [id number]     Arrival date & time 11/20/12  0411 History   First MD Initiated Contact with Patient 11/20/12 [phone]     Chief Complaint  Patient presents with  . Code Stroke   (Consider location/radiation/quality/duration/timing/severity/associated sxs/prior Treatment) The history is provided by the patient and the EMS personnel.   62 year old female was brought in as a code stroke. She went to sleep at about 8:30 PM and was fine and woke up at about 3 AM and noted a shaking in her left arm, left side of her abdomen, and left leg. She called EMS who noted ongoing shaking, gave her a dose of lorazepam and the shaking stopped but she had residual weakness. She denies headache, visual change, chest pain, nausea, vomiting. She denies any arthralgias or myalgias. She is being treated for metastatic lung cancer and has had radiation treatment to brain metastases. Of note, she is still smoking.  Past Medical History  Diagnosis Date  . Nodule of right lung     right upper lobe  . Anemia   . Rectovaginal fistula   . Right frontal lobe lesion 12/13/11    CT head  . History of radiation therapy 12/24/11-02/08/12    rul 66Gy/56fxs  . Dysphagia   . History of radiation therapy 12/27/11    SRS rt ant frontal 20gy  . Lung cancer 11/23/11    biopsy-right  . Brain metastases   . Chest pain at rest 08/26/2012  . COPD (chronic obstructive pulmonary disease)   . Shortness of breath    Past Surgical History  Procedure Laterality Date  . Partial hysterectomy    . Hemmorhoid surgery    . Neck surgery    . Retinal detachment surgery    . Esophagogastroduodenoscopy N/A 05/01/2012    Procedure: ESOPHAGOGASTRODUODENOSCOPY (EGD);  Surgeon: Shirley Friar, MD;  Location: [facility];  Service: Endoscopy;  Laterality: N/A;  . Subxyphoid pericardial window N/A 08/28/2012    Procedure: SUBXYPHOID PERICARDIAL WINDOW;  Surgeon: Kerin Perna, MD;  Location: [facility] OR;  Service: Thoracic;  Laterality: N/A;   . Tee without cardioversion  08/28/2012    Procedure: TRANSESOPHAGEAL ECHOCARDIOGRAM (TEE);  Surgeon: Kerin Perna, MD;  Location: [facility] OR;  Service: Thoracic;;  . Eye surgery    . Tubal ligation    . Mass excision Right 09/26/2012    Procedure: EXCISION MASS;  Surgeon: Kerin Perna, MD;  Location[facility] OR;  Service: Vascular;  Laterality: Right;  EXCISION OF RIGHT NECK  MASS   Family History  Problem Relation Age of Onset  . Heart disease Father   . Kidney disease Mother   . Alzheimer's disease Sister   . Alzheimer's disease Brother    History  Substance Use Topics  . Smoking status: Current Every Day Smoker -- 0.50 packs/day for 43 years    Types: Cigarettes  . Smokeless tobacco: Never Used  . Alcohol Use: No   OB History   Grav Para Term Preterm Abortions TAB SAB Ect Mult Living                 Review of Systems  All other systems reviewed and are negative.    Allergies  Carboplatin and Levaquin  Home Medications   Current Outpatient Rx  Name  Route  Sig  Dispense  Refill  . albuterol (PROVENTIL) (5 MG/ML) 0.5% nebulizer solution   Nebulization   Take 2.5 mg by nebulization every 6 (six) hours as needed for wheezing.         [name]  erlotinib (TARCEVA) 150 MG tablet   Oral   Take 1 tablet (150 mg total) by mouth daily. Take on an empty stomach 1 hour before meals or 2 hours after.   30 tablet   1   . folic acid (FOLVITE) 1 MG tablet   Oral   Take 1 mg by mouth daily.         [name] ipratropium (ATROVENT) 0.02 % nebulizer solution   Nebulization   Take 500 mcg by nebulization every 4 (four) hours as needed for wheezing.         [name] oxyCODONE-acetaminophen (PERCOCET/ROXICET) 5-325 MG per tablet   Oral   Take 1 tablet by mouth every 4 (four) hours as needed for pain.   90 tablet   0   . pentoxifylline (TRENTAL) 400 MG CR tablet   Oral   Take 1 tablet (400 mg total) by mouth 2 (two) times daily with a meal.   60 tablet   5   . prochlorperazine (COMPAZINE)  10 MG tablet   Oral   Take 10 mg by mouth every 6 (six) hours as needed.         . promethazine (PHENERGAN) 25 MG tablet   Oral   Take 25 mg by mouth every 6 (six) hours as needed for nausea.         . sucralfate (CARAFATE) 1 G tablet   Oral   Take 1 g by mouth daily.         . vitamin E (VITAMIN E) 400 UNIT capsule   Oral   Take 1 capsule (400 Units total) by mouth 2 (two) times daily.   60 capsule   5    BP 106/82  Pulse 123  Temp(Src) 97.9 F (36.6 C) (Oral)  Resp 29  SpO2 100% Physical Exam  Nursing note and vitals reviewed.  62 year old female, resting comfortably and in no acute distress. Vital signs are significant for tachycardia with heart rate 123, and tachypnea with respiratory rate of 20. Oxygen saturation is 100%, which is normal. Head is normocephalic and atraumatic. PERRLA, EOMI. Oropharynx is clear. Neck is nontender and supple without adenopathy or JVD. There no carotid bruits. Back is nontender and there is no CVA tenderness. Lungs have faint expiratory wheezes but no rales or rhonchi. Chest is nontender. Heart has regular rate and rhythm without murmur. Abdomen is soft, flat, nontender without masses or hepatosplenomegaly and peristalsis is normoactive. Extremities have no cyanosis or edema, full range of motion is present. Skin is warm and dry without rash. Neurologic: Mental status is normal, cranial nerves are intact there is moderate left hemiparesis with left arm strength and 3-4/5 and left leg strength 3/5.  ED Course  Procedures (including critical care time) Labs Review Results for orders placed during the hospital encounter of 11/20/12  [facility]      Result Value Range   Prothrombin Time 12.8  11.6 - 15.2 seconds   INR 0.98  0.00 - 1.49  APTT      Result Value Range   aPTT 25  24 - 37 seconds  CBC      Result Value Range   WBC 8.7  4.0 - 10.5 K/uL   RBC 3.92  3.87 - 5.11 MIL/uL   Hemoglobin 11.1 (*) 12.0 - 15.0 g/dL   HCT  40.9 (*) 81.1 - 46.0 %   MCV 88.8  78.0 - 100.0 fL   MCH 28.3  26.0 - 34.0 pg  MCHC 31.9  30.0 - 36.0 g/dL   RDW 16.1 (*) 09.6 - 04.5 %   Platelets 359  150 - 400 K/uL  DIFFERENTIAL      Result Value Range   Neutrophils Relative % 77  43 - 77 %   Neutro Abs 6.7  1.7 - 7.7 K/uL   Lymphocytes Relative 9 (*) 12 - 46 %   Lymphs Abs 0.8  0.7 - 4.0 K/uL   Monocytes Relative 10  3 - 12 %   Monocytes Absolute 0.8  0.1 - 1.0 K/uL   Eosinophils Relative 5  0 - 5 %   Eosinophils Absolute 0.4  0.0 - 0.7 K/uL   Basophils Relative 1  0 - 1 %   Basophils Absolute 0.0  0.0 - 0.1 K/uL  GLUCOSE, CAPILLARY      Result Value Range   Glucose-Capillary 107 (*) 70 - 99 mg/dL   Comment 1 Documented in Chart     Comment 2 Notify RN     Comment 3 Confirm Test in Lab    POCT I-STAT, CHEM 8      Result Value Range   Sodium 141  135 - 145 mEq/L   Potassium 3.8  3.5 - 5.1 mEq/L   Chloride 105  96 - 112 mEq/L   BUN 18  6 - 23 mg/dL   Creatinine, Ser 4.09  0.50 - 1.10 mg/dL   Glucose, Bld 811 (*) 70 - 99 mg/dL   Calcium, Ion 9.14  7.82 - 1.30 mmol/L   TCO2 29  0 - 100 mmol/L   Hemoglobin 11.9 (*) 12.0 - 15.0 g/dL   HCT 95.6 (*) 21.3 - 08.6 %  POCT I-STAT TROPONIN I      Result Value Range   Troponin i, poc 0.00  0.00 - 0.08 ng/mL   Comment 3             Imaging Review Ct Head Wo Contrast  11/20/2012   CLINICAL DATA:  Code stroke.  EXAM: CT HEAD WITHOUT CONTRAST  TECHNIQUE: Contiguous axial images were obtained from the base of the skull through the vertex without intravenous contrast.  COMPARISON:  Brain MRI 10/28/2012.  FINDINGS: Skull:No acute osseous abnormality. No lytic or blastic lesion.  Orbits: Left cataract resection. Right lens present based on previous MRI.  Brain: White matter low-attenuation centered in the right posterior frontal and parietal lobes consistent with vasogenic edema related to treated metastases in this region. The periatrial metastasis is faintly visible as a peripheral  high-density rim. The extent of edema is similar to prior, as is the sulcal effacement. There is a small amount of low attenuation in the inferior right frontal lobe, which may be a combination of edema and gliosis based on previous MRI. No evidence of cortical or deep gray nuclei ischemia. No shift or hydrocephalus.  These results were called by telephone at the time of interpretation on 11/20/2012 at 4:27 AMto the code stroke physician.  IMPRESSION: 1. No evidence of acute ischemia. 2. Given cross modality differences, similar vasogenic edema in the posterior right cerebrum, surrounding treated cerebral metastases. 3. No evident increase in the edema or treatment change in the inferior right frontal lobe, also related to treated metastasis.   Electronically Signed   By: Tiburcio Pea   On: 11/20/2012 04:30    Date: 11/20/2012  Rate: 124  Rhythm: sinus tachycardia  QRS Axis: normal  Intervals: PR prolonged  ST/T Wave abnormalities: normal  Conduction Disutrbances:none  Narrative  Interpretation: Atrial hypertrophy, sinus tachycardia. When compared with ECG of 09/30/2012, no significant changes are seen.  Old EKG Reviewed: unchanged   MDM   1. Left-sided weakness   2. 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 Since this does not appear to be a stroke, code stroke was canceled. Patient was seen in conjunction with Dr. Petra Kuba of [facility] who agreed that she did meet code stroke criteria. Because the likelihood of seizure being due to cerebral metastases, she was given levetiracetam and dexamethasone. CT has come back with no acute changes. She will need to be admitted on the medical service.    Dione Booze, MD 11/20/12 [phone]

## 2012-11-20 NOTE — ED Notes (Signed)
Admitting MD at bedside.

## 2012-11-20 NOTE — ED Notes (Addendum)
Per EMS pt woke up at 0300 this morning with her left arm jerking, left sided weakness and numbness in both her arm and her leg. Per EMS pt went to bed last night around 1930/2000 and did not have any sx. Pt is A&OX4. No facial asymmetry noted. Pt was still jerking upon EMS arrival. EMS adm 1 mg of ativan to the pt to help with the jerking.

## 2012-11-20 NOTE — Telephone Encounter (Signed)
Pt's daughter called and left message regarding pt having a stroke and admitted to [facility], Judithann Graves also notified

## 2012-11-20 NOTE — Code Documentation (Signed)
62 yo wf brought in via [place] [facility] for Lt side weakness.  Per pt she was resting in bed after waking and noticed her Lt side was weak.  EMS was called & during transport treated her with Ativan for seizure like activity.  On arrival in [facility] pt was alert & responsive with Lt weakness.  Pt states she is no longer taking her decadron.  Code stroke called 0356, pt arrival 78, LKW 1930, EDP exam 0409, stroke team arrival 0404, neurologist arrival 0411, pt arrival in CT 0413, phlebotomist arrival 0410, code stroke canceled 0411.  NIH 6 for left weakness, neglect, & sensory loss.

## 2012-11-20 NOTE — H&P (Addendum)
[facility] History and Physical  Melanie Liu NWG:[id number] DOB: 01/16/1951 DOA: 11/20/2012  Referring physician: ER physician. PCP: [name], MD  Specialists:  [name]. Oncologist.  Chief Complaint:  Left-sided weakness and abnormal movements.  HPI: Melanie Liu is a 62 y.o. female  with known history of metastatic non-small cell lung cancer was found to have left-sided weakness and abnormal movements at around 3 AM by patient's daughter. Patient asked patient's daughter to help her to the bathroom this morning when patient's daughter found her to have abnormal movements and following which she had some weakness in the left upper and lower extremities. Patient did not lose consciousness. Patient was brought to the ER as a code stroke and was seen by Dr. Amada Jupiter of neurology and at this time Dr. 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 Patient has been given Decadron and Keppra and has been admitted for further management. Patient did receive Ativan given by the EMS. As per the daughter patient has left-sided weakness seems to be improving at this time. Patient otherwise did not have any chest pain or shortness of breath. Has been having some cough since start of Tarceva as per the patient's daughter.  Review of Systems: As presented in the history of presenting illness, rest negative.  Past Medical History  Diagnosis Date  . Nodule of right lung     right upper lobe  . Anemia   . Rectovaginal fistula   . Right frontal lobe lesion 12/13/11    CT head  . History of radiation therapy 12/24/11-02/08/12    rul 66Gy/51fxs  . Dysphagia   . History of radiation therapy 12/27/11    SRS rt ant frontal 20gy  . Lung cancer 11/23/11    biopsy-right  . Brain metastases   . Chest pain at rest 08/26/2012  . COPD (chronic obstructive pulmonary disease)   . Shortness of breath    Past Surgical History  Procedure  Laterality Date  . Partial hysterectomy    . Hemmorhoid surgery    . Neck surgery    . Retinal detachment surgery    . Esophagogastroduodenoscopy N/A 05/01/2012    Procedure: ESOPHAGOGASTRODUODENOSCOPY (EGD);  Surgeon: Shirley Friar, MD;  Location: [facility];  Service: Endoscopy;  Laterality: N/A;  . Subxyphoid pericardial window N/A 08/28/2012    Procedure: SUBXYPHOID PERICARDIAL WINDOW;  Surgeon: Kerin Perna, MD;  Location[facility] OR;  Service: Thoracic;  Laterality: N/A;  . Tee without cardioversion  08/28/2012    Procedure: TRANSESOPHAGEAL ECHOCARDIOGRAM (TEE);  Surgeon: Kerin Perna, MD;  Location: [facility] OR;  Service: Thoracic;;  . Eye surgery    . Tubal ligation    . Mass excision Right 09/26/2012    Procedure: EXCISION MASS;  Surgeon: Kerin Perna, MD;  Location: [facility] OR;  Service: Vascular;  Laterality: Right;  EXCISION OF RIGHT NECK  MASS   Social History:  reports that she has been smoking Cigarettes.  She has a 21.5 pack-year smoking history. She has never used smokeless tobacco. She reports that she does not drink alcohol or use illicit drugs.  home. where does patient live-- not sure. Can patient participate in ADLs?  Allergies  Allergen Reactions  . Carboplatin Shortness Of Breath  . Levaquin [Levofloxacin In D5w] Rash    itching    Family History  Problem Relation Age of Onset  . Heart disease Father   . Kidney disease Mother   . Alzheimer's disease Sister   .  Alzheimer's disease Brother       Prior to Admission medications   Medication Sig Start Date End Date Taking? Authorizing Provider  albuterol (PROVENTIL) (5 MG/ML) 0.5% nebulizer solution Take 2.5 mg by nebulization every 6 (six) hours as needed for wheezing.   Yes Historical Provider, MD  erlotinib (TARCEVA) 150 MG tablet Take 1 tablet (150 mg total) by mouth daily. Take on an empty stomach 1 hour before meals or 2 hours after. 10/22/12  Yes [name], MD  folic acid (FOLVITE) 1 MG tablet Take 1  mg by mouth daily.   Yes Historical Provider, MD  ipratropium (ATROVENT) 0.02 % nebulizer solution Take 500 mcg by nebulization every 4 (four) hours as needed for wheezing.   Yes Historical Provider, MD  oxyCODONE-acetaminophen (PERCOCET/ROXICET) 5-325 MG per tablet Take 1 tablet by mouth every 4 (four) hours as needed for pain. 10/29/12  Yes [name], MD  pentoxifylline (TRENTAL) 400 MG CR tablet Take 1 tablet (400 mg total) by mouth 2 (two) times daily with a meal. 10/29/12  Yes [name], MD  prochlorperazine (COMPAZINE) 10 MG tablet Take 10 mg by mouth every 6 (six) hours as needed.   Yes Historical Provider, MD  promethazine (PHENERGAN) 25 MG tablet Take 25 mg by mouth every 6 (six) hours as needed for nausea.   Yes Historical Provider, MD  sucralfate (CARAFATE) 1 G tablet Take 1 g by mouth daily.   Yes Historical Provider, MD  vitamin E (VITAMIN E) 400 UNIT capsule Take 1 capsule (400 Units total) by mouth 2 (two) times daily. 10/29/12  Yes [name], MD   Physical Exam: Filed Vitals:   11/20/12 0445 11/20/12 0500 11/20/12 0522 11/20/12 0630  BP: 106/82 117/76  110/74  Pulse: 123 121  110  Temp: 98.1 F (36.7 C)  97.9 F (36.6 C)   TempSrc: Oral     Resp: 29 23  20   SpO2: 100% 100%  100%     General:  Well over well-nourished.  Eyes:  Anicteric no pallor.  ENT:  No discharge from ears eyes nose mouth.  Neck:  No mass felt.  Cardiovascular:  S1-S2 heard.  Respiratory:  No rhonchi or crepitations.  Abdomen:  Soft nontender bowel sounds present.  Skin:  No rash.  Musculoskeletal:  No edema.  Psychiatric:  Patient is mildly lethargic.  Neurologic:  Patient is lethargic but arousable and follows commands. There is left upper and lower extremity weakness 3 x 5. No facial asymmetry. PERRLA positive.  Labs on Admission:  Basic Metabolic Panel:  Recent Labs Lab 11/20/12 0415 11/20/12 0419  NA 137 141  K 3.6 3.8  CL 100 105  CO2 24  --    GLUCOSE 100* 108*  BUN 17 18  CREATININE 0.89 1.00  CALCIUM 10.2  --    Liver Function Tests:  Recent Labs Lab 11/20/12 0415  AST 15  ALT <5  ALKPHOS 103  BILITOT 0.2*  PROT 7.4  ALBUMIN 3.3*   No results found for this basename: LIPASE, AMYLASE,  in the last 168 hours No results found for this basename: AMMONIA,  in the last 168 hours CBC:  Recent Labs Lab 11/20/12 0415 11/20/12 0419  WBC 8.7  --   NEUTROABS 6.7  --   HGB 11.1* 11.9*  HCT 34.8* 35.0*  MCV 88.8  --   PLT 359  --    Cardiac Enzymes:  Recent Labs Lab 11/20/12 0414  TROPONINI <0.30  BNP (last 3 results)  Recent Labs  08/27/12 0200  PROBNP 635.4*   CBG:  Recent Labs Lab 11/20/12 0433  GLUCAP 107*    Radiological Exams on Admission: Ct Head Wo Contrast  11/20/2012   CLINICAL DATA:  Code stroke.  EXAM: CT HEAD WITHOUT CONTRAST  TECHNIQUE: Contiguous axial images were obtained from the base of the skull through the vertex without intravenous contrast.  COMPARISON:  Brain MRI 10/28/2012.  FINDINGS: Skull:No acute osseous abnormality. No lytic or blastic lesion.  Orbits: Left cataract resection. Right lens present based on previous MRI.  Brain: White matter low-attenuation centered in the right posterior frontal and parietal lobes consistent with vasogenic edema related to treated metastases in this region. The periatrial metastasis is faintly visible as a peripheral high-density rim. The extent of edema is similar to prior, as is the sulcal effacement. There is a small amount of low attenuation in the inferior right frontal lobe, which may be a combination of edema and gliosis based on previous MRI. No evidence of cortical or deep gray nuclei ischemia. No shift or hydrocephalus.  These results were called by telephone at the time of interpretation on 11/20/2012 at 4:27 AMto the code stroke physician.  IMPRESSION: 1. No evidence of acute ischemia. 2. Given cross modality differences, similar  vasogenic edema in the posterior right cerebrum, surrounding treated cerebral metastases. 3. No evident increase in the edema or treatment change in the inferior right frontal lobe, also related to treated metastasis.   Electronically Signed   By: Tiburcio Pea   On: 11/20/2012 04:30     Assessment/Plan Principal Problem:   Left-sided weakness Active Problems:   Lung cancer   Brain Mets - 17 mm Rt Frontal s/p SRS, then Lt Frontal 5 mm and Rt Parietal 18 mm mets s/p SRS, now with new 5 mm Rt Parietal met   1. Left-sided weakness with possible seizures and has paralysis in a patient with known history of metastatic non-small cell lung cancer - per neurology recommendations patient will be continued on Keppra and IV Decadron. Closely monitor. Further recommendations per neurologist. 2. Non-small cell lung cancer metastatic - per oncologist. 3. COPD - presently not wheezing. 4. Recent pericardial window for pericardial effusion.  Chest x-ray is pending.    Code Status:  Full code.  Family Communication:  Patient's daughter at the bedside.  Disposition Plan:  Admit to inpatient.    Theordore Cisnero N. [facility] Pager [phone].  If 7PM-7AM, please contact night-coverage [email or web address] Password [email or web address] 11/20/2012, 6:51 AM

## 2012-11-20 NOTE — ED Notes (Signed)
Code stroke cancelled at this time per Dr Amada Jupiter.

## 2012-11-20 NOTE — Evaluation (Signed)
Occupational Therapy Evaluation Patient Details Name: Melanie Liu MRN: [id number] DOB: 04/01/1950 Today's Date: 11/20/2012 Time: 1478-2956 OT Time Calculation (min): 19 min  OT Assessment / Plan / Recommendation History of present illness 62 y.o. female with known history of metastatic non-small cell lung cancer was found to have left-sided weakness and abnormal movements at around 3 AM by patient's daughter. Patient was brought to the ER as a code stroke and was seen by Dr. Amada Jupiter of neurology and at this time Dr. 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 Patient has been given Decadron and Keppra and has been admitted for further management. MRI (+) Progressed enhancement of the right parietal and right posterior frontal/ temporal metastases, each with moderate to severely increased cerebral edema. MRI characteristics are most suggestive of post radiation treatment effect. True progression of disease is less likely.Stable smaller right anterior inferior frontal metastasis. Small left superior frontal gyrus metastasis is no longer enhancing.    Clinical Impression   PT admitted with new onset of abnormal movements admitted to r/o seizures. Pt currently with functional limitiations due to the deficits listed below (see OT problem list).  Pt will benefit from skilled OT to increase their independence and safety with adls and balance to allow discharge HHOT and RW. Pt is medicaid pending. OT to follow acutely and provide HEP for LT pending d/c    OT Assessment  Patient needs continued OT Services    Follow Up Recommendations  Home health OT;Supervision - Intermittent (needs (A) for ambulation)    Barriers to Discharge      Equipment Recommendations  3 in 1 bedside comode;Other (comment) (RW)    Recommendations for Other Services    Frequency  Min 2X/week    Precautions / Restrictions Precautions Precautions: Fall;Other (comment)  (seizure)   Pertinent Vitals/Pain No pain    ADL  Grooming: Wash/dry hands;Min guard Where Assessed - Grooming: Unsupported standing Lower Body Dressing: Supervision/safety (incr time to don socks, decr coordination) Where Assessed - Lower Body Dressing: Unsupported sitting    OT Diagnosis: Generalized weakness  OT Problem List: Decreased strength;Decreased activity tolerance;Impaired balance (sitting and/or standing);Decreased safety awareness;Decreased knowledge of use of DME or AE;Decreased knowledge of precautions OT Treatment Interventions: Self-care/ADL training;Therapeutic exercise;Neuromuscular education;Therapeutic activities;Patient/family education;Balance training   OT Goals(Current goals can be found in the care plan section) Acute Rehab OT Goals Patient Stated Goal: to return home. I will do therapy if they can get me my Medicaid to have it. I can't afford it otherwise OT Goal Formulation: With patient Time For Goal Achievement: 12/04/12 Potential to Achieve Goals: Good ADL Goals Pt Will Perform Upper Body Bathing: with modified independence;standing Pt Will Perform Lower Body Bathing: with modified independence;sit to/from stand Pt Will Transfer to Toilet: with modified independence;regular height toilet Additional ADL Goal #1: Pt will complete 30 minutes of activity without rest break Supervision level  Visit Information  Last OT Received On: 11/20/12 Assistance Needed: +1 History of Present Illness: 62 y.o. female with known history of metastatic non-small cell lung cancer was found to have left-sided weakness and abnormal movements at around 3 AM by patient's daughter. Patient was brought to the ER as a code stroke and was seen by Dr. Amada Jupiter of neurology and at this time Dr. 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 Patient has been given Decadron and Keppra and has been admitted for further management. MRI (+)  Progressed enhancement of the right  parietal and right posterior frontal/ temporal metastases, each with moderate to severely increased cerebral edema. MRI characteristics are most suggestive of post radiation treatment effect. True progression of disease is less likely.Stable smaller right anterior inferior frontal metastasis. Small left superior frontal gyrus metastasis is no longer enhancing.        Prior Functioning     Home Living Family/patient expects to be discharged to:: Private residence Living Arrangements: Children (daughter) Available Help at Discharge: Available PRN/intermittently Type of Home: House Home Access: Stairs to enter Secretary/administrator of Steps: 3 Entrance Stairs-Rails: None Home Layout: One level Home Equipment: None Additional Comments: pt lives in [place], son in [place], Daughter in [place]. sister lives nearby patient (does not drive) Prior Function Level of Independence: Independent Communication Communication: No difficulties Dominant Hand: Right         Vision/Perception Vision - History Baseline Vision: Wears contacts (wears only in right eye) Patient Visual Report: No change from baseline Vision - Assessment Vision Assessment: Vision not tested   Cognition  Cognition Arousal/Alertness: Awake/alert Behavior During Therapy: WFL for tasks assessed/performed Overall Cognitive Status: Within Functional Limits for tasks assessed    Extremity/Trunk Assessment Upper Extremity Assessment Upper Extremity Assessment: LUE deficits/detail LUE Deficits / Details: 4 out 5 , reports "feels weaker" LUE Coordination: decreased fine motor Lower Extremity Assessment Lower Extremity Assessment: LLE deficits/detail LLE Deficits / Details: see pt eval Cervical / Trunk Assessment Cervical / Trunk Assessment: Kyphotic     Mobility Bed Mobility Bed Mobility: Supine to Sit;Sitting - Scoot to Edge of Bed;Sit to Supine Supine to Sit: 5:  Supervision Sitting - Scoot to Edge of Bed: 5: Supervision Sit to Supine: 5: Supervision Transfers Transfers: Sit to Stand;Stand to Sit Sit to Stand: 4: Min assist;With upper extremity assist;From bed Stand to Sit: With upper extremity assist;4: Min assist;To bed Details for Transfer Assistance: pt unsteady and LOB with static standing initially. Pt needs hand held (A) or RT UE grasp on IV pole for stability     Exercise     Balance     End of Session OT - End of Session Activity Tolerance: Patient tolerated treatment well Patient left: in bed;with call bell/phone within reach Nurse Communication: Mobility status;Precautions  GO     Harolyn Rutherford 11/20/2012, 3:44 PM Pager: [phone]

## 2012-11-20 NOTE — Consult Note (Signed)
Neurology Consultation Reason for Consult: Left Sided weakness Referring Physician: Preston Fleeting, D  CC: Left Sided Weakness  History is obtained from:patient, family  HPI: Melanie Liu is a 62 y.o. female who awoke around 3 am and had left sided jerking. Her daughter who witnessed it describes a tense left arm with clenched fist. She was given 1mg  ativan by the paramedics with cessation of this activity.  She has received a radiation to multiple brain metastasis. She states that she has had left arm shaking several times before, but never as bad as this so she has not mentioned it to anybody before.   ROS: A 14 point ROS was performed and is negative except as noted in the HPI.  Past Medical History  Diagnosis Date  . Nodule of right lung     right upper lobe  . Anemia   . Rectovaginal fistula   . Right frontal lobe lesion 12/13/11    CT head  . History of radiation therapy 12/24/11-02/08/12    rul 66Gy/21fxs  . Dysphagia   . History of radiation therapy 12/27/11    SRS rt ant frontal 20gy  . Lung cancer 11/23/11    biopsy-right  . Brain metastases   . Chest pain at rest 08/26/2012  . COPD (chronic obstructive pulmonary disease)   . Shortness of breath     Family History: No hx sz  Social History: Tob: continues to smokes  Exam: Current vital signs: SpO2 98% Vital signs in last 24 hours: SpO2:  [98 %] 98 % (09/18 0424) General: In bed, NAd CV: regular, tachycardic Mental Status: Patient is awake, alert, oriented to person, place, month, year, and situation. Immediate and remote memory are intact. Patient is able to give a clear and coherent history. No signs of aphasia. She does extinguish to double simultaneous stimulation.  Cranial Nerves: II: Visual Fields with a mild left field cut. Pupils are equal, round, and reactive to light.  Discs are difficult to visualize. III,IV, VI: EOMI without ptosis or diploplia.  V: Facial sensation is diminished on the left.  VII:  Facial movement is symmetric.  VIII: hearing is intact to voice X: Uvula elevates symmetrically XI: Shoulder shrug is symmetric. XII: tongue is midline without atrophy or fasciculations.  Motor: Tone is normal. Bulk is normal. 5/5 strength was present on the right, on the left she has 3/5 weakness of both the arm and leg.  Sensory: Sensation is absent on the left leg, decreased on the left arm. Intact to LT on right.  Deep Tendon Reflexes: 2+ and symmetric in the biceps and patellae.  Cerebellar: FNF and HKS are intact on the left Gait: Not tested due to weakness.   I have reviewed labs in epic and the results pertinent to this consultation are: Chem 8 - unremarkable.   I have reviewed the images obtained:CT head- known metastasis with edema.   Impression: 62 yo F with metastatic lung adenocarcinoma. I suspect her left sided weakness is a todd's phenomenon. Stroke is a small possilbility but she would not be a candidate for lytics given her mets. She will need to be on an AED from this point forward.   Recommendations: 1) If left sided weakness does not continue to improve, would pursue EEG and could consider MRI.  2) Keppra 1000mg  once, 500mg  BID.  3) would start decadron 4mg  q12h for cerebral edema. 4) PT if left sided weakness does not improve.    Ritta Slot, MD [facility] [phone]  If 7pm- 7am, please page neurology on call at [phone].

## 2012-11-20 NOTE — ED Notes (Signed)
Admitting MD at the bedside.  

## 2012-11-20 NOTE — Progress Notes (Signed)
Utilization review completed. Lety Cullens, RN, BSN. 

## 2012-11-20 NOTE — Progress Notes (Signed)
Pt was seen and examined.  H&P and orders reviewed.  New orders placed.   Maryln Manuel, MD

## 2012-11-20 NOTE — Progress Notes (Signed)
INITIAL NUTRITION ASSESSMENT  DOCUMENTATION CODES Per approved criteria  -Non-severe (moderate) malnutrition in the context of chronic illness -Underweight   INTERVENTION: No nutrition intervention at this time ---> patient declined RD to follow for nutrition care plan  NUTRITION DIAGNOSIS: Increased nutrient needs related to catabolic illness as evidenced by estimated nutrition needs  Goal: Pt to meet >/= 90% of their estimated nutrition needs   Monitor:  PO intake, weight, labs, I/O's   Reason for Assessment: Health History  62 y.o. female  Admitting Dx: Left-sided weakness  ASSESSMENT: Patient with PMH of metastatic non-small cell lung cancer who was found to have left-sided weakness and abnormal movements by patient's daughter; brought to ER as a code stroke and was seen by Neurology; was given Decadron and Keppra and admitted for further management.   Patient reports her appetite is "pretty good;" per weight readings, her weight has been stable; patient with mild muscle & fat loss to upper body; doesn't care for Ensure supplements as it causes her to be constipated; stated she didn't like Resource Breeze supplements either.  Patient meets criteria for non-severe (moderate) malnutrition in the context of chronic illness as evidenced mild muscle loss (clavicles, acromion bone region) and mild subcutaneous fat loss (upper arm region).  Height: Ht Readings from Last 1 Encounters:  11/20/12 5\' 3"  (1.6 m)    Weight: Wt Readings from Last 1 Encounters:  11/20/12 101 lb 11.2 oz (46.131 kg)    Ideal Body Weight: 115 lb  % Ideal Body Weight: 87%  Wt Readings from Last 30 Encounters:  11/20/12 101 lb 11.2 oz (46.131 kg)  10/22/12 102 lb 6.4 oz (46.448 kg)  10/06/12 99 lb 4.8 oz (45.042 kg)  10/01/12 101 lb (45.813 kg)  09/30/12 103 lb 14.4 oz (47.129 kg)  09/26/12 102 lb 11.8 oz (46.6 kg)  09/26/12 102 lb 11.8 oz (46.6 kg)  09/24/12 101 lb (45.813 kg)  09/22/12 101  lb 1.6 oz (45.859 kg)  09/15/12 100 lb (45.36 kg)  09/01/12 100 lb 12.8 oz (45.723 kg)  09/01/12 100 lb 12.8 oz (45.723 kg)  08/25/12 103 lb 6.4 oz (46.902 kg)  08/05/12 101 lb (45.813 kg)  07/21/12 101 lb 1.6 oz (45.859 kg)  07/07/12 100 lb 1.6 oz (45.405 kg)  06/16/12 101 lb (45.813 kg)  05/26/12 106 lb 4.8 oz (48.217 kg)  05/26/12 106 lb 6.4 oz (48.263 kg)  04/30/12 102 lb (46.267 kg)  04/30/12 102 lb (46.267 kg)  04/28/12 102 lb 3.2 oz (46.358 kg)  04/15/12 122 lb 12.7 oz (55.7 kg)  04/07/12 99 lb 6.4 oz (45.088 kg)  04/04/12 99 lb 14.4 oz (45.314 kg)  03/10/12 101 lb 3.2 oz (45.904 kg)  02/08/12 103 lb (46.72 kg)  01/30/12 106 lb 9.6 oz (48.353 kg)  01/28/12 103 lb 3.2 oz (46.811 kg)  01/25/12 108 lb 8 oz (49.215 kg)    Usual Body Weight: 103 lb  % Usual Body Weight: 98%  BMI:  Body mass index is 18.02 kg/(m^2).  Estimated Nutritional Needs: Kcal: 1400-1600 Protein: 75-85 gm Fluid: </= 1.5 L  Skin: Intact  Diet Order: General  EDUCATION NEEDS: -No education needs identified at this time   Intake/Output Summary (Last 24 hours) at 11/20/12 1208 Last data filed at 11/20/12 0517  Gross per 24 hour  Intake    100 ml  Output      0 ml  Net    100 ml    Labs:   Recent Labs Lab  11/20/12 0415 11/20/12 0419 11/20/12 0917  NA 137 141 135  K 3.6 3.8 4.0  CL 100 105 101  CO2 24  --  22  BUN 17 18 16   CREATININE 0.89 1.00 0.61  CALCIUM 10.2  --  9.8  GLUCOSE 100* 108* 109*    CBG (last 3)   Recent Labs  11/20/12 0433  GLUCAP 107*    Scheduled Meds: . dexamethasone  4 mg Intravenous Q12H  . enoxaparin (LOVENOX) injection  40 mg Subcutaneous Q24H  . erlotinib  150 mg Oral Daily  . famotidine  20 mg Oral Daily  . folic acid  1 mg Oral Daily  . levETIRAcetam  500 mg Oral BID  . nicotine  14 mg Transdermal Daily  . pentoxifylline  400 mg Oral BID WC  . sucralfate  1 g Oral Daily    Continuous Infusions: . sodium chloride 20 mL/hr (11/20/12  0745)    Past Medical History  Diagnosis Date  . Nodule of right lung     right upper lobe  . Anemia   . Rectovaginal fistula   . Right frontal lobe lesion 12/13/11    CT head  . History of radiation therapy 12/24/11-02/08/12    rul 66Gy/20fxs  . Dysphagia   . History of radiation therapy 12/27/11    SRS rt ant frontal 20gy  . Lung cancer 11/23/11    biopsy-right  . Brain metastases   . Chest pain at rest 08/26/2012  . COPD (chronic obstructive pulmonary disease)   . Shortness of breath     Past Surgical History  Procedure Laterality Date  . Partial hysterectomy    . Hemmorhoid surgery    . Neck surgery    . Retinal detachment surgery    . Esophagogastroduodenoscopy N/A 05/01/2012    Procedure: ESOPHAGOGASTRODUODENOSCOPY (EGD);  Surgeon: Shirley Friar, MD;  Location: [facility];  Service: Endoscopy;  Laterality: N/A;  . Subxyphoid pericardial window N/A 08/28/2012    Procedure: SUBXYPHOID PERICARDIAL WINDOW;  Surgeon: Kerin Perna, MD;  Location: [facility] OR;  Service: Thoracic;  Laterality: N/A;  . Tee without cardioversion  08/28/2012    Procedure: TRANSESOPHAGEAL ECHOCARDIOGRAM (TEE);  Surgeon: Kerin Perna, MD;  Location: [facility] OR;  Service: Thoracic;;  . Eye surgery    . Tubal ligation    . Mass excision Right 09/26/2012    Procedure: EXCISION MASS;  Surgeon: Kerin Perna, MD;  Location: [facility] OR;  Service: Vascular;  Laterality: Right;  EXCISION OF RIGHT NECK  MASS    Maureen Chatters, RD, LDN Pager #: [phone] After-Hours Pager #: [phone]

## 2012-11-21 ENCOUNTER — Telehealth: Payer: Self-pay | Admitting: Radiation Oncology

## 2012-11-21 DIAGNOSIS — F17200 Nicotine dependence, unspecified, uncomplicated: Secondary | ICD-10-CM

## 2012-11-21 LAB — LIPID PANEL
Cholesterol: 192 mg/dL (ref 0–200)
HDL: 41 mg/dL
LDL Cholesterol: 113 mg/dL — ABNORMAL HIGH (ref 0–99)
Total CHOL/HDL Ratio: 4.7 ratio
Triglycerides: 189 mg/dL — ABNORMAL HIGH
VLDL: 38 mg/dL (ref 0–40)

## 2012-11-21 MED ORDER — OXYCODONE-ACETAMINOPHEN 5-325 MG PO TABS: 1.0000 | ORAL_TABLET | ORAL | Status: DC | PRN

## 2012-11-21 MED ORDER — LEVETIRACETAM 750 MG PO TABS
750.0000 mg | ORAL_TABLET | Freq: Two times a day (BID) | ORAL | Status: DC
  Filled 2012-11-21: qty 1

## 2012-11-21 MED ORDER — DEXAMETHASONE 1 MG PO TABS: 1.0000 mg | ORAL_TABLET | Freq: Two times a day (BID) | ORAL | Status: DC

## 2012-11-21 MED ORDER — LEVETIRACETAM 250 MG PO TABS
250.0000 mg | ORAL_TABLET | Freq: Once | ORAL | Status: DC
  Filled 2012-11-21: qty 1

## 2012-11-21 MED ORDER — LEVETIRACETAM 750 MG PO TABS: 750.0000 mg | ORAL_TABLET | Freq: Two times a day (BID) | ORAL | Status: DC

## 2012-11-21 NOTE — Telephone Encounter (Signed)
Phoned patient's daughter, Toniann Fail. She reports that he mother is much stronger today. She explains plans are being made for possible discharge home today. Also, she reports the neurologist just left the room and explained that the edema in her mother's brain is the cause of her seizures. Also, she confirms that he increased her Keppra dose and wrote a prescription for po decadron. She questions if Dr. Kathrynn Running would consider changing her mother's percocet 5/325 two tablets every four hours to something stronger "because the percocet isn't controlling her pain. Confirmed pain she was referring to was in the right shoulder. Drishti reports her mother is completely out of pain medication at home. Encouraged her to obtain a script prior to discharge for pain and explained this writer would inform Dr. Kathrynn Running of this request. She verbalized understanding and expressed appreciation for the call.

## 2012-11-21 NOTE — Discharge Summary (Addendum)
Physician Discharge Summary  Melanie Liu JXB:[id number] DOB: 1950-06-04 DOA: 11/20/2012  PCP: [name], MD  Admit date: 11/20/2012 Discharge date: 11/21/2012  Recommendations for Outpatient Follow-up:  1. Stop smoking 2. Follow up with oncology and neurology in 1 week   Discharge Diagnoses:  Principal Problem:   Left-sided weakness Active Problems:   Lung cancer   Brain Mets - 17 mm Rt Frontal s/p SRS, then Lt Frontal 5 mm and Rt Parietal 18 mm mets s/p SRS, now with new 5 mm Rt Parietal met   Malnutrition of moderate degree   Cerebral Edema   Discharge Condition: stable  Diet recommendation: resume previous home diet   Filed Weights   11/20/12 0746  Weight: 46.131 kg (101 lb 11.2 oz)    History of present illness:  Melanie Liu is a 62 y.o. female with known history of metastatic non-small cell lung cancer was found to have left-sided weakness and abnormal movements at around 3 AM by patient's daughter. Patient asked patient's daughter to help her to the bathroom this morning when patient's daughter found her to have abnormal movements and following which she had some weakness in the left upper and lower extremities. Patient did not lose consciousness. Patient was brought to the ER as a code stroke and was seen by Dr. Amada Jupiter of neurology and at this time Dr. 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 Patient has been given Decadron and Keppra and has been admitted for further management. Patient did receive Ativan given by the EMS. As per the daughter patient has left-sided weakness seems to be improving at this time. Patient otherwise did not have any chest pain or shortness of breath. Has been having some cough since start of Tarceva as per the patient's daughter.  Hospital Course:  1. Left-sided weakness with possible seizures and has paralysis in a patient with known history of metastatic non-small cell lung  cancer - symptoms have improved.  Pt was seen by PT and no further PT follow up recommended.  per neurology recommendations patient can be discharged today, will be continued on Keppra and dose was increased to 750 mg bid. 5 more days of oral decadron. Follow up with oncologist and neurologist outpatient.  Closely monitor. Further recommendations per neurologist. 2. Non-small cell lung cancer metastatic - per oncologist. 3. COPD - presently not wheezing. 4. Recent pericardial window for pericardial effusion.  Procedures:  CXR - no acute  Consultations: Neurology  Discharge Exam: Pt is anxious to go home.  Weakness has resolved. No seizure activity.  Filed Vitals:   11/21/12 1008  BP: 108/67  Pulse: 98  Temp: 97.3 F (36.3 C)  Resp: 18    General: Well over well-nourished.  Eyes: Anicteric no pallor.  ENT: No discharge from ears eyes nose mouth.  Neck: No mass felt.  Cardiovascular: S1-S2 heard.  Respiratory: No rhonchi or crepitations.  Abdomen: Soft nontender bowel sounds present.  Skin: No rash.  Musculoskeletal: No edema.  Psychiatric: Patient is mildly lethargic.  Neurologic: Patient is lethargic but arousable and follows commands. There is left upper and lower extremity weakness 4/5 improved from prior exam. No facial asymmetry. PERRLA positive.  Discharge Instructions  Discharge Orders   Future Appointments Provider Department Dept Phone   01/27/2013 10:00 AM [facility] AT [address] [phone]   Patient to arrive 15 minutes prior to appointment time.   01/28/2013 8:30 AM [name], MD [facility] [phone]   Future Orders Complete By Expires   Diet - low sodium heart healthy  As directed    Discharge instructions  As directed    Comments:     Stop smoking Follow up with oncologist in 1 week Follow up with neurologist in 2 weeks Return if symptoms recur, worsen or new problems develop.    Discontinue IV  As directed    Increase activity slowly  As directed        Medication List         albuterol (5 MG/ML) 0.5% nebulizer solution  Commonly known as:  PROVENTIL  Take 2.5 mg by nebulization every 6 (six) hours as needed for wheezing.     CARAFATE 1 G tablet  Generic drug:  sucralfate  Take 1 g by mouth daily.     dexamethasone 1 MG tablet  Commonly known as:  DECADRON  Take 1 tablet (1 mg total) by mouth 2 (two) times daily with a meal.     erlotinib 150 MG tablet  Commonly known as:  TARCEVA  Take 1 tablet (150 mg total) by mouth daily. Take on an empty stomach 1 hour before meals or 2 hours after.     folic acid 1 MG tablet  Commonly known as:  FOLVITE  Take 1 mg by mouth daily.     ipratropium 0.02 % nebulizer solution  Commonly known as:  ATROVENT  Take 500 mcg by nebulization every 4 (four) hours as needed for wheezing.     levETIRAcetam 750 MG tablet  Commonly known as:  KEPPRA  Take 1 tablet (750 mg total) by mouth 2 (two) times daily.     oxyCODONE-acetaminophen 5-325 MG per tablet  Commonly known as:  PERCOCET/ROXICET  Take 1 tablet by mouth every 4 (four) hours as needed for pain.     pentoxifylline 400 MG CR tablet  Commonly known as:  TRENTAL  Take 1 tablet (400 mg total) by mouth 2 (two) times daily with a meal.     prochlorperazine 10 MG tablet  Commonly known as:  COMPAZINE  Take 10 mg by mouth every 6 (six) hours as needed.     promethazine 25 MG tablet  Commonly known as:  PHENERGAN  Take 25 mg by mouth every 6 (six) hours as needed for nausea.     vitamin E 400 UNIT capsule  Commonly known as:  vitamin E  Take 1 capsule (400 Units total) by mouth 2 (two) times daily.       Allergies  Allergen Reactions  . Carboplatin Shortness Of Breath  . Levaquin [Levofloxacin In D5w] Rash    itching       Follow-up Information   Follow up with [name], MD. Schedule an appointment as soon as possible for a visit in 2  weeks.   Specialty:  Family Medicine   Contact information:   [address] [place] [address] [phone]       Follow up with Lajuana Matte., MD. Schedule an appointment as soon as possible for a visit in 1 week.   Specialty:  Oncology   Contact information:   [address] [place] [address] [phone]       Follow up with [facility]. Schedule an appointment as soon as possible for a visit in 2 weeks. (follow up seizures and medication)        The results of significant diagnostics from this hospitalization (including imaging, microbiology,  ancillary and laboratory) are listed below for reference.    Significant Diagnostic Studies: Ct Head Wo Contrast  11/20/2012   CLINICAL DATA:  Code stroke.  EXAM: CT HEAD WITHOUT CONTRAST  TECHNIQUE: Contiguous axial images were obtained from the base of the skull through the vertex without intravenous contrast.  COMPARISON:  Brain MRI 10/28/2012.  FINDINGS: Skull:No acute osseous abnormality. No lytic or blastic lesion.  Orbits: Left cataract resection. Right lens present based on previous MRI.  Brain: White matter low-attenuation centered in the right posterior frontal and parietal lobes consistent with vasogenic edema related to treated metastases in this region. The periatrial metastasis is faintly visible as a peripheral high-density rim. The extent of edema is similar to prior, as is the sulcal effacement. There is a small amount of low attenuation in the inferior right frontal lobe, which may be a combination of edema and gliosis based on previous MRI. No evidence of cortical or deep gray nuclei ischemia. No shift or hydrocephalus.  These results were called by telephone at the time of interpretation on 11/20/2012 at 4:27 AMto the code stroke physician.  IMPRESSION: 1. No evidence of acute ischemia. 2. Given cross modality differences, similar vasogenic edema in the posterior right cerebrum, surrounding treated cerebral  metastases. 3. No evident increase in the edema or treatment change in the inferior right frontal lobe, also related to treated metastasis.   Electronically Signed   By: Tiburcio Pea   On: 11/20/2012 04:30   Mr Brain W Wo Contrast  10/28/2012   CLINICAL DATA:  62 year old female with metastatic lung cancer. Stereotactic radiosurgery treatment, restaging.  EXAM: MRI HEAD WITHOUT AND WITH CONTRAST  TECHNIQUE: Multiplanar, multiecho pulse sequences of the brain and surrounding structures were obtained according to standard protocol without and with intravenous contrast  CONTRAST:  10mL MULTIHANCE GADOBENATE DIMEGLUMINE 529 MG/ML IV SOLN  COMPARISON:  07/18/2012 and earlier.  FINDINGS: Progression of size, enhancement, and surrounding edema at the right parietal and posterior frontal/ temporal metastases. The left parietal lesion area of enhancement is increased to 14 x 15 mm (previously 6 x 9 mm), while the left posterior frontal/ temporal lesion enhancement is increased to 12 x 13 mm (previously 5 mm diameter. Surrounding edema at both levels has significantly progressed and is moderate to severe. Notably, both lesions now also show central restricted diffusion.  There is associated mass effect on the right lateral ventricle. No ventriculomegaly or midline shift.  At the same time they small left superior frontal cortically based metastasis is no longer visible (series 10, image 122). And a and inferior right frontal lobe enhancing metastasis is stable measuring approximately 6 mm (image 70).  No new cerebral metastasis identified.  No restricted diffusion or evidence of acute infarction. Ventricular size and configuration are within normal limits. No acute intracranial hemorrhage identified. Negative pituitary, cervicomedullary junction and visualized cervical spinal cord. Major intracranial vascular flow voids are stable. Normal bone marrow signal. Stable orbits soft tissues, paranasal sinuses and mastoids.  Negative scalp soft tissues.  IMPRESSION: 1. Progressed enhancement of the right parietal and right posterior frontal/ temporal metastases, each with moderate to severely increased cerebral edema. MRI characteristics are most suggestive of post radiation treatment effect. True progression of disease is less likely.  2. Stable smaller right anterior inferior frontal metastasis. Small left superior frontal gyrus metastasis is no longer enhancing.  3.  No new metastasis.  These results will be called to the ordering clinician or representative by the Radiologist Assistant, and  communication documented in the PACS Dashboard.   Electronically Signed   By: Augusto Gamble   On: 10/28/2012 11:07   Dg Chest Port 1 View  11/20/2012   *RADIOLOGY REPORT*  Clinical Data: Left-sided weakness.  History of right lung cancer.  PORTABLE CHEST - 1 VIEW  Comparison: 09/24/2012  Findings: There is chronic opacification of the right upper lung zone, slightly increased laterally.  There is right upper lobe volume loss with shift of the mediastinum to the right.  There are no new infiltrates or effusions.  Pulmonary vascularity is normal. Emphysematous changes are apparent in the left lung.  No acute osseous abnormality.  IMPRESSION: Slight increased opacification of the contracted right upper lobe. No other change.   Original Report Authenticated By: Francene Boyers, M.D.    Microbiology: No results found for this or any previous visit (from the past 240 hour(s)).   Labs: Basic Metabolic Panel:  Recent Labs Lab 11/20/12 0415 11/20/12 0419 11/20/12 0917  NA 137 141 135  K 3.6 3.8 4.0  CL 100 105 101  CO2 24  --  22  GLUCOSE 100* 108* 109*  BUN 17 18 16   CREATININE 0.89 1.00 0.61  CALCIUM 10.2  --  9.8   Liver Function Tests:  Recent Labs Lab 11/20/12 0415  AST 15  ALT <5  ALKPHOS 103  BILITOT 0.2*  PROT 7.4  ALBUMIN 3.3*   No results found for this basename: LIPASE, AMYLASE,  in the last 168 hours No results  found for this basename: AMMONIA,  in the last 168 hours CBC:  Recent Labs Lab 11/20/12 0415 11/20/12 0419 11/20/12 0917  WBC 8.7  --  8.5  NEUTROABS 6.7  --  8.0*  HGB 11.1* 11.9* 11.1*  HCT 34.8* 35.0* 35.4*  MCV 88.8  --  88.3  PLT 359  --  336   Cardiac Enzymes:  Recent Labs Lab 11/20/12 0414  TROPONINI <0.30   BNP: BNP (last 3 results)  Recent Labs  08/27/12 0200  PROBNP 635.4*   CBG:  Recent Labs Lab 11/20/12 0433  GLUCAP 107*   Signed:  Clanford Johnson  [facility] 11/21/2012, 1:06 PM

## 2012-11-21 NOTE — Progress Notes (Signed)
Patient mentioned about an episode of quivering movements on to the left side of her abdomen last night, which lasted for about an hour. Pt states, it gradually resolved after she received the scheduled dose of Keppra which was due around that time. No other complaints, weakness on left side is resolving. Will keep monitoring. Danne Harbor

## 2012-11-21 NOTE — Telephone Encounter (Signed)
Message copied by Agnes Lawrence on Fri Nov 21, 2012 10:17 AM ------      Message from: Darryl Nestle R      Created: Thu Nov 20, 2012  3:06 PM      Regarding: PATIENT       Hi Dr. Kathrynn Running,             Elita Quick daughter Toniann Fail) called and asked me to tell you that her mom is an in-patient at [facility] room 19 , she had a seizure this morning.  Wendy's phone number is [phone].            Talbert Forest ------

## 2012-11-21 NOTE — Evaluation (Signed)
Physical Therapy Evaluation Patient Details Name: Melanie Liu MRN: [id number] DOB: Jun 10, 1950 Today's Date: 11/21/2012 Time: 1030-1100 PT Time Calculation (min): 30 min  PT Assessment / Plan / Recommendation History of Present Illness  62 y.o. female with known history of metastatic non-small cell lung cancer was found to have left-sided weakness and abnormal movements at around 3 AM by patient's daughter. Patient was brought to the ER as a code stroke and was seen by Dr. Amada Jupiter of neurology and at this time Dr. 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 Patient has been given Decadron and Keppra and has been admitted for further management. MRI (+) Progressed enhancement of the right parietal and right posterior frontal/ temporal metastases, each with moderate to severely increased cerebral edema. MRI characteristics are most suggestive of post radiation treatment effect. True progression of disease is less likely.Stable smaller right anterior inferior frontal metastasis. Small left superior frontal gyrus metastasis is no longer enhancing.   Clinical Impression  Patient presents with decreased independence with mobility due to deficits listed below.  She will benefit from skilled OPT int he acute setting to maximize independence and allow return home with family assist after d/c.  Would benefit from follow up HHPT, but patient with no insurance and cannot afford.    PT Assessment  Patient needs continued PT services    Follow Up Recommendations  No PT follow up;Supervision/Assistance - 24 hour       Barriers to Discharge Decreased caregiver support currently daughter works, but states she is planning to stay with pt 24 hours very soon    Equipment Recommendations  None recommended by PT       Frequency Min 3X/week    Precautions / Restrictions Precautions Precautions: Fall;Other (comment) (seizure)   Pertinent Vitals/Pain C/o  right arm pain, RN aware      Mobility  Bed Mobility Supine to Sit: HOB elevated;6: Modified independent (Device/Increase time) Sitting - Scoot to Edge of Bed: 6: Modified independent (Device/Increase time) Sit to Supine: 6: Modified independent (Device/Increase time) Transfers Sit to Stand: 5: Supervision;From bed Stand to Sit: 5: Supervision;To bed Details for Transfer Assistance: holding onto bed in standing for balance Ambulation/Gait Ambulation/Gait Assistance: 5: Supervision Ambulation Distance (Feet): 300 Feet Assistive device: Rolling walker Ambulation/Gait Assistance Details: did not notice and left LE fatigue today.  Able to stay upright, clear left foot and not demonstrate any dragging of left foot.  Still slightly posterior left pelvis Gait Pattern: Step-through pattern;Decreased stride length;Trunk rotated posteriorly on left    Exercises General Exercises - Lower Extremity Ankle Circles/Pumps: AROM;Both;5 reps;Supine Straight Leg Raises: AROM;Both;Other reps (comment);Supine Low Level/ICU Exercises Stabilized Bridging: AROM;Both;Strengthening;5 reps;Supine   PT Diagnosis: Abnormality of gait  PT Problem List: Decreased strength;Decreased balance;Decreased safety awareness;Decreased mobility PT Treatment Interventions: DME instruction;Balance training;Gait training;Neuromuscular re-education;Functional mobility training;Therapeutic activities;Patient/family education;Therapeutic exercise     PT Goals(Current goals can be found in the care plan section) Acute Rehab PT Goals Patient Stated Goal: to go home. PT Goal Formulation: With patient Time For Goal Achievement: 11/28/12 Potential to Achieve Goals: Good  Visit Information  Last PT Received On: 11/21/12 Assistance Needed: +1 History of Present Illness: 62 y.o. female with known history of metastatic non-small cell lung cancer was found to have left-sided weakness and abnormal movements at around 3 AM by  patient's daughter. Patient was brought to the ER as a code stroke and was seen by Dr. Amada Jupiter of neurology and at this time Dr.  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 Patient has been given Decadron and Keppra and has been admitted for further management. MRI (+) Progressed enhancement of the right parietal and right posterior frontal/ temporal metastases, each with moderate to severely increased cerebral edema. MRI characteristics are most suggestive of post radiation treatment effect. True progression of disease is less likely.Stable smaller right anterior inferior frontal metastasis. Small left superior frontal gyrus metastasis is no longer enhancing.        Prior Functioning  Home Living Family/patient expects to be discharged to:: Private residence Living Arrangements: Children Available Help at Discharge: Available 24 hours/day;Available PRN/intermittently Type of Home: House Home Access: Stairs to enter;Ramped entrance Entrance Stairs-Number of Steps: 3 Entrance Stairs-Rails: None Home Layout: One level Home Equipment: Walker - 2 wheels;Shower seat Additional Comments: pt lives in [place], son in [place], Daughter in [place]. sister lives nearby patient (tub shower) Daughter moving in to stay with pt and will be in and out due to work, but planning to change so can be with her all the time Prior Function Level of Independence: Independent Communication Communication: No difficulties Dominant Hand: Right    Cognition  Cognition Arousal/Alertness: Awake/alert Behavior During Therapy: WFL for tasks assessed/performed Overall Cognitive Status: Within Functional Limits for tasks assessed    Extremity/Trunk Assessment Upper Extremity Assessment Upper Extremity Assessment: Defer to OT evaluation Lower Extremity Assessment Lower Extremity Assessment: RLE deficits/detail;LLE deficits/detail RLE Deficits / Details: AROM WFL,  strength hip flexion 4-/5, knee extension 4+/5, ankle DF 4+/5 LLE Deficits / Details: AROM WFL, strength hip flexion 3+/5, knee extension 4/5, ankle DF 4/5 Cervical / Trunk Assessment Cervical / Trunk Assessment: Kyphotic   Balance Balance Balance Assessed: Yes Static Standing Balance Static Standing - Balance Support: Left upper extremity supported Static Standing - Level of Assistance: 5: Stand by assistance Static Standing - Comment/# of Minutes: standing beside bed about 45 seconds prior to walking  End of Session PT - End of Session Equipment Utilized During Treatment: Gait belt Activity Tolerance: Patient tolerated treatment well Patient left: in bed;with call bell/phone within reach;with family/visitor present Nurse Communication: Mobility status  GP     [facility] 11/21/2012, 11:17 AM Sheran Lawless, PT [phone] 11/21/2012

## 2012-11-27 ENCOUNTER — Telehealth: Payer: Self-pay | Admitting: *Deleted

## 2012-11-27 ENCOUNTER — Other Ambulatory Visit: Payer: Self-pay | Admitting: *Deleted

## 2012-11-27 ENCOUNTER — Telehealth: Payer: Self-pay | Admitting: Internal Medicine

## 2012-11-27 DIAGNOSIS — G936 Cerebral edema: Secondary | ICD-10-CM | POA: Diagnosis present

## 2012-11-27 NOTE — Telephone Encounter (Signed)
Pt FTKA for lab and f/u with AJ due to being in the hospital.  Onc tx schedule filled out to r/s appt with AJ within 1 week.  SLJ

## 2012-11-27 NOTE — Telephone Encounter (Signed)
s.w. pt and advised on 10.2.14 appts...pt ok and awre

## 2012-11-28 ENCOUNTER — Other Ambulatory Visit: Payer: Self-pay | Admitting: *Deleted

## 2012-12-04 ENCOUNTER — Encounter: Payer: Self-pay | Admitting: *Deleted

## 2012-12-04 ENCOUNTER — Encounter: Payer: Self-pay | Admitting: Physician Assistant

## 2012-12-04 ENCOUNTER — Telehealth: Payer: Self-pay | Admitting: Internal Medicine

## 2012-12-04 ENCOUNTER — Other Ambulatory Visit (HOSPITAL_BASED_OUTPATIENT_CLINIC_OR_DEPARTMENT_OTHER): Payer: Self-pay | Admitting: Lab

## 2012-12-04 ENCOUNTER — Ambulatory Visit (HOSPITAL_BASED_OUTPATIENT_CLINIC_OR_DEPARTMENT_OTHER): Payer: Self-pay | Admitting: Physician Assistant

## 2012-12-04 DIAGNOSIS — C3410 Malignant neoplasm of upper lobe, unspecified bronchus or lung: Secondary | ICD-10-CM

## 2012-12-04 DIAGNOSIS — M25519 Pain in unspecified shoulder: Secondary | ICD-10-CM

## 2012-12-04 DIAGNOSIS — F17200 Nicotine dependence, unspecified, uncomplicated: Secondary | ICD-10-CM

## 2012-12-04 LAB — CBC WITH DIFFERENTIAL/PLATELET
Basophils Absolute: 0.1 10*3/uL (ref 0.0–0.1)
Eosinophils Absolute: 0.1 10*3/uL (ref 0.0–0.5)
HCT: 34 % — ABNORMAL LOW (ref 34.8–46.6)
HGB: 11.2 g/dL — ABNORMAL LOW (ref 11.6–15.9)
LYMPH%: 3 % — ABNORMAL LOW (ref 14.0–49.7)
MONO#: 0.8 10*3/uL (ref 0.1–0.9)
NEUT#: 16.8 10*3/uL — ABNORMAL HIGH (ref 1.5–6.5)
NEUT%: 92.1 % — ABNORMAL HIGH (ref 38.4–76.8)
Platelets: 324 10*3/uL (ref 145–400)
WBC: 18.3 10*3/uL — ABNORMAL HIGH (ref 3.9–10.3)

## 2012-12-04 LAB — COMPREHENSIVE METABOLIC PANEL (CC13)
CO2: 26 mEq/L (ref 22–29)
Calcium: 9.7 mg/dL (ref 8.4–10.4)
Creatinine: 0.8 mg/dL (ref 0.6–1.1)
Glucose: 105 mg/dl (ref 70–140)
Total Bilirubin: 0.25 mg/dL (ref 0.20–1.20)

## 2012-12-04 MED ORDER — OXYCODONE-ACETAMINOPHEN 5-325 MG PO TABS: 1.0000 | ORAL_TABLET | ORAL | Status: DC | PRN

## 2012-12-04 NOTE — Patient Instructions (Addendum)
Resume and continue Tarceva 150 mg by mouth daily Follow up in 1 month

## 2012-12-04 NOTE — Telephone Encounter (Signed)
gv and printed appt sched and avs for pt for OCT...gv pt barium °

## 2012-12-04 NOTE — Progress Notes (Addendum)
[facility] [phone]   [phone]  OFFICE PROGRESS NOTE  [name], MD [address] [place] [address]  DIAGNOSIS: Metastatic non-small cell lung cancer, adenocarcinoma with negative EGFR mutation and negative ALK gene translocation diagnosed in September of 2013 with metastatic single brain lesion.   PRIOR THERAPY:  1) Status post stereotactic radiosurgery to the single brain lesion under the care Dr. Kathrynn Running.  2) Concurrent chemoradiation with chemotherapy in the form of weekly carboplatin for an AUC of 2 and paclitaxel at 45 mg per meter squared concurrent with radiation therapy under the care Dr. Kathrynn Running with partial response in her disease.  3) Systemic chemotherapy with carboplatin for AUC of 5 and Alimta 500 mg/M2 every 3 weeks. From cycle 3 forward she is receiving carboplatin for an AUC of 4 and Alimta at 375 mg per meter squared due to thrombocytopenia. Status post a total of 6 cycles with evidence for disease progression.  4) Subxiphoid pericardial window for drainage of pericardial effusion under the care of Dr. Donata Clay on 08/28/2012 and the final pathology showed no malignant cells in the pleural fluid.   CURRENT THERAPY: Tarceva 150 mg by mouth daily. Therapy starting 09/30/2012   CHEMOTHERAPY INTENT: Palliative  CURRENT # OF CHEMOTHERAPY CYCLES: Approximately 2 monthsof Tarceva  CURRENT ANTIEMETICS: None  CURRENT SMOKING STATUS: Current smoker  ORAL CHEMOTHERAPY AND CONSENT: yes  CURRENT BISPHOSPHONATES USE: none  PAIN MANAGEMENT: Percocet  NARCOTICS INDUCED CONSTIPATION: none  LIVING WILL AND CODE STATUS: No CODE BLUE.   INTERVAL HISTORY: Melanie Liu 62 y.o. female returns to the clinic today for followup visit accompanied her daughter and son. The patient is tolerating her treatment with Tarceva fairly well except for mild skin rash. The patient denied having any significant weight loss or night sweats. She  denied having any nausea or vomiting. She has no chest pain except for right shoulder pain, shortness of breath, cough or hemoptysis. She rates her pain as 8/10 but improved with oxycodone down to 3/10. She was admitted 11/20/2012 with a seizure. She was discharged on Keppra and is to followup with neurology. She states that she ran out of her Tarceva on 11/23/2012 and due to insurance issues has been unable to get a refill. She has since been in touch with [facility] and they are sending her her Tarceva overnight to arrive on 12/05/2012. She requests a refill for her Percocet 5/325 mg tablets that she takes 3-4 times daily for pain management, primarily of the right shoulder. Unfortunately the patient continues to smoke and she is again strongly encouraged  to quit smoking.   MEDICAL HISTORY: Past Medical History  Diagnosis Date  . Nodule of right lung     right upper lobe  . Anemia   . Rectovaginal fistula   . Right frontal lobe lesion 12/13/11    CT head  . History of radiation therapy 12/24/11-02/08/12    rul 66Gy/53fxs  . Dysphagia   . History of radiation therapy 12/27/11    SRS rt ant frontal 20gy  . Lung cancer 11/23/11    biopsy-right  . Brain metastases   . Chest pain at rest 08/26/2012  . COPD (chronic obstructive pulmonary disease)   . Shortness of breath     ALLERGIES:  is allergic to carboplatin and levaquin.  MEDICATIONS:  Current Outpatient Prescriptions  Medication Sig Dispense Refill  . albuterol (PROVENTIL) (5 MG/ML) 0.5% nebulizer solution Take 2.5 mg by nebulization  every 6 (six) hours as needed for wheezing.      [name] dexamethasone (DECADRON) 1 MG tablet Take 1 tablet (1 mg total) by mouth 2 (two) times daily with a meal.  10 tablet  0  . erlotinib (TARCEVA) 150 MG tablet Take 1 tablet (150 mg total) by mouth daily. Take on an empty stomach 1 hour before meals or 2 hours after.  30 tablet  1  . folic acid (FOLVITE) 1 MG tablet Take 1 mg by mouth daily.      [name]  ipratropium (ATROVENT) 0.02 % nebulizer solution Take 500 mcg by nebulization every 4 (four) hours as needed for wheezing.      . levETIRAcetam (KEPPRA) 750 MG tablet Take 1 tablet (750 mg total) by mouth 2 (two) times daily.  60 tablet  0  . oxyCODONE-acetaminophen (PERCOCET/ROXICET) 5-325 MG per tablet Take 1 tablet by mouth every 4 (four) hours as needed for pain.  60 tablet  0  . pentoxifylline (TRENTAL) 400 MG CR tablet Take 1 tablet (400 mg total) by mouth 2 (two) times daily with a meal.  60 tablet  5  . prochlorperazine (COMPAZINE) 10 MG tablet Take 10 mg by mouth every 6 (six) hours as needed.      . promethazine (PHENERGAN) 25 MG tablet Take 25 mg by mouth every 6 (six) hours as needed for nausea.      . sucralfate (CARAFATE) 1 G tablet Take 1 g by mouth daily.      . vitamin E (VITAMIN E) 400 UNIT capsule Take 1 capsule (400 Units total) by mouth 2 (two) times daily.  60 capsule  5   No current facility-administered medications for this visit.    SURGICAL HISTORY:  Past Surgical History  Procedure Laterality Date  . Partial hysterectomy    . Hemmorhoid surgery    . Neck surgery    . Retinal detachment surgery    . Esophagogastroduodenoscopy N/A 05/01/2012    Procedure: ESOPHAGOGASTRODUODENOSCOPY (EGD);  Surgeon: Shirley Friar, MD;  Location: [facility];  Service: Endoscopy;  Laterality: N/A;  . Subxyphoid pericardial window N/A 08/28/2012    Procedure: SUBXYPHOID PERICARDIAL WINDOW;  Surgeon: Kerin Perna, MD;  Location: [facility] OR;  Service: Thoracic;  Laterality: N/A;  . Tee without cardioversion  08/28/2012    Procedure: TRANSESOPHAGEAL ECHOCARDIOGRAM (TEE);  Surgeon: Kerin Perna, MD;  Location: [facility] OR;  Service: Thoracic;;  . Eye surgery    . Tubal ligation    . Mass excision Right 09/26/2012    Procedure: EXCISION MASS;  Surgeon: Kerin Perna, MD;  Location: [facility] OR;  Service: Vascular;  Laterality: Right;  EXCISION OF RIGHT NECK  MASS    REVIEW OF SYSTEMS:  A  comprehensive review of systems was negative except for: Constitutional: positive for fatigue Musculoskeletal: positive for Right shoulder pain   PHYSICAL EXAMINATION: General appearance: alert, cooperative, fatigued and no distress Head: Normocephalic, without obvious abnormality, atraumatic Neck: no adenopathy Lymph nodes: Cervical, supraclavicular, and axillary nodes normal. Resp: clear to auscultation bilaterally Cardio: regular rate and rhythm, S1, S2 normal, no murmur, click, rub or gallop GI: soft, non-tender; bowel sounds normal; no masses,  no organomegaly Extremities: extremities normal, atraumatic, no cyanosis or edema  ECOG PERFORMANCE STATUS: 1 - Symptomatic but completely ambulatory  Blood pressure 119/71, pulse 97, temperature 97.5 F (36.4 C), temperature source Oral, resp. rate 18, height 5\' 3"  (1.6 m), weight 98 lb 3.2 oz (44.543 kg).  LABORATORY DATA: Lab Results  Component Value Date   WBC 18.3* 12/04/2012   HGB 11.2* 12/04/2012   HCT 34.0* 12/04/2012   MCV 86.2 12/04/2012   PLT 324 12/04/2012      Chemistry      Component Value Date/Time   NA 143 12/04/2012 1032   NA 135 11/20/2012 0917   K 3.3* 12/04/2012 1032   K 4.0 11/20/2012 0917   CL 101 11/20/2012 0917   CL 102 08/18/2012 0938   CO2 26 12/04/2012 1032   CO2 22 11/20/2012 0917   BUN 19.2 12/04/2012 1032   BUN 16 11/20/2012 0917   CREATININE 0.8 12/04/2012 1032   CREATININE 0.61 11/20/2012 0917      Component Value Date/Time   CALCIUM 9.7 12/04/2012 1032   CALCIUM 9.8 11/20/2012 0917   ALKPHOS 111 12/04/2012 1032   ALKPHOS 103 11/20/2012 0415   AST 11 12/04/2012 1032   AST 15 11/20/2012 0415   ALT 7 12/04/2012 1032   ALT <5 11/20/2012 0415   BILITOT 0.25 12/04/2012 1032   BILITOT 0.2* 11/20/2012 0415       RADIOGRAPHIC STUDIES: Dg Chest 2 View  09/24/2012   *RADIOLOGY REPORT*  Clinical Data: Follow-up pericardial effusion status post sub xyphoid pericardial window.  CHEST - 2 VIEW  Comparison: Radiographs  09/01/2012.  CT 08/27/2012.  Findings: The heart size is stable without evidence of recurrent pericardial effusion.  There is stable mediastinal shift to the right and right apical pleural parenchymal density.  The left lung is clear.  There is no significant residual pleural effusion.  A retrosternal lucency on the lateral view has no definite correlative finding on the frontal examination and is probably related to rotation.  I do not see definite findings to suggest an anterior pneumothorax or pneumomediastinum.  IMPRESSION:  1.  Stable heart size without evidence of recurrent pericardial effusion. 2.  Resolved pleural effusions.   Original Report Authenticated By: Carey Bullocks, M.D.    ASSESSMENT AND PLAN: This is a very pleasant 62 years old white female with metastatic non-small cell lung cancer, adenocarcinoma currently undergoing treatment with oral Tarceva status post 3 weeks of treatment and tolerating her treatment fairly well. She has no significant skin rash or diarrhea. Patient was discussed with also seen by [name]. When she receives her new prescription of Tarceva on 12/05/2012 she is to continue at 150 mg by mouth daily. She'll return in one month with a repeat CBC differential, C. met and CT of the chest, abdomen and pelvis with contrast to reevaluate her disease. She is given a refill for Percocet 5/325 mg tablets a total of 60 tablets with no refill. Should she continue to have issues with her Tarceva supply she is to notify us immediately. Patient and her family members voiced understanding.  Conni Slipper, PA-C   The patient voices understanding of current disease status and treatment options and is in agreement with the current care plan.  All questions were answered. The patient knows to call the clinic with any problems, questions or concerns. We can certainly see the patient much sooner if necessary.  ADDENDUM: Hematology/Oncology Attending: I had a face to face  encounter with the patient. I recommended her care plan. This is a very pleasant 62 years old white female with metastatic non-small cell lung cancer, adenocarcinoma currently on treatment with Tarceva 150 mg by mouth daily status post 2 months of treatment. She is tolerating her treatment fairly well with no significant adverse effects. She was recently  admitted to [facility] with seizure activity and followed by neurology.  She is currently on Keppra. I recommended for the patient to continue her current treatment with Tarceva. I would see her back for follow up visit in one month with repeat CT scan of the chest, abdomen and pelvis for restaging of her disease. For smoke cessation, I strongly advise the patient to quit smoking and offered to smoke cessation program. Lajuana Matte., MD 12/07/2012

## 2012-12-04 NOTE — Progress Notes (Signed)
Quick Note:  Call patient with the result and encourage K rich diet ______ 

## 2012-12-04 NOTE — Progress Notes (Signed)
12/04/2012 Patient in to clinic today for regularly scheduled appointment. Note that 11/20/2012 appointment was rescheduled due to patient's recent hospitalization. Upon arrival to the clinic, the patient completed the Patient Reported Outcomes (PROs) independently, including the EQ-5D-3L followed by the Lung Cancer Symptom Scale (LCSS), prior to her lab and MD appointments. Following completion, patient identifiers were added to the EQ-5D-3L questionnaire. Patient is aware that research samples will be collected at today's lab visit. Cindy S. Clelia Croft BSN, RN, CCRP 12/05/2012 8:27 AM

## 2012-12-05 ENCOUNTER — Telehealth: Payer: Self-pay | Admitting: *Deleted

## 2012-12-05 NOTE — Telephone Encounter (Signed)
Message copied by Caren Griffins on Fri Dec 05, 2012  2:55 PM ------      Message from: Melanie Liu      Created: Thu Dec 04, 2012  5:05 PM       Call patient with the result and encourage K rich diet ------

## 2012-12-05 NOTE — Telephone Encounter (Signed)
Pt verbalized understanding.  SLJ 

## 2012-12-09 ENCOUNTER — Encounter: Payer: Self-pay | Admitting: Internal Medicine

## 2012-12-09 NOTE — Progress Notes (Signed)
Put [employer] disability form on nurse's desk.

## 2012-12-11 ENCOUNTER — Encounter: Payer: Self-pay | Admitting: Internal Medicine

## 2012-12-11 NOTE — Progress Notes (Signed)
Faxed disability form to [facility] @ [phone].

## 2012-12-17 ENCOUNTER — Other Ambulatory Visit: Payer: Self-pay | Admitting: *Deleted

## 2012-12-23 ENCOUNTER — Other Ambulatory Visit: Payer: Self-pay | Admitting: Internal Medicine

## 2012-12-23 DIAGNOSIS — C3490 Malignant neoplasm of unspecified part of unspecified bronchus or lung: Secondary | ICD-10-CM

## 2012-12-24 ENCOUNTER — Telehealth: Payer: Self-pay | Admitting: *Deleted

## 2012-12-24 NOTE — Telephone Encounter (Signed)
Keppra refill request received from [facility].  Refill request denied per [name] with instructions for request to go to PCP.  Dr. Fidel Levy is PCP per our records.

## 2012-12-25 ENCOUNTER — Other Ambulatory Visit: Payer: Self-pay | Admitting: *Deleted

## 2012-12-30 ENCOUNTER — Other Ambulatory Visit (HOSPITAL_BASED_OUTPATIENT_CLINIC_OR_DEPARTMENT_OTHER): Payer: Self-pay | Admitting: Lab

## 2012-12-30 ENCOUNTER — Ambulatory Visit (HOSPITAL_COMMUNITY)
Admission: RE | Admit: 2012-12-30 | Discharge: 2012-12-30 | Disposition: A | Discharge status: Home / Self Care | Payer: Self-pay | Source: Ambulatory Visit | Attending: Physician Assistant | Admitting: Physician Assistant

## 2012-12-30 ENCOUNTER — Encounter (HOSPITAL_COMMUNITY): Payer: Self-pay

## 2012-12-30 DIAGNOSIS — R160 Hepatomegaly, not elsewhere classified: Secondary | ICD-10-CM | POA: Insufficient documentation

## 2012-12-30 DIAGNOSIS — K4090 Unilateral inguinal hernia, without obstruction or gangrene, not specified as recurrent: Secondary | ICD-10-CM | POA: Insufficient documentation

## 2012-12-30 DIAGNOSIS — Z923 Personal history of irradiation: Secondary | ICD-10-CM | POA: Insufficient documentation

## 2012-12-30 DIAGNOSIS — C3490 Malignant neoplasm of unspecified part of unspecified bronchus or lung: Secondary | ICD-10-CM | POA: Insufficient documentation

## 2012-12-30 DIAGNOSIS — K7689 Other specified diseases of liver: Secondary | ICD-10-CM | POA: Insufficient documentation

## 2012-12-30 DIAGNOSIS — N289 Disorder of kidney and ureter, unspecified: Secondary | ICD-10-CM | POA: Insufficient documentation

## 2012-12-30 DIAGNOSIS — C7931 Secondary malignant neoplasm of brain: Secondary | ICD-10-CM | POA: Insufficient documentation

## 2012-12-30 DIAGNOSIS — C3410 Malignant neoplasm of upper lobe, unspecified bronchus or lung: Secondary | ICD-10-CM

## 2012-12-30 DIAGNOSIS — K8020 Calculus of gallbladder without cholecystitis without obstruction: Secondary | ICD-10-CM | POA: Insufficient documentation

## 2012-12-30 DIAGNOSIS — J984 Other disorders of lung: Secondary | ICD-10-CM | POA: Insufficient documentation

## 2012-12-30 DIAGNOSIS — I700 Atherosclerosis of aorta: Secondary | ICD-10-CM | POA: Insufficient documentation

## 2012-12-30 DIAGNOSIS — J438 Other emphysema: Secondary | ICD-10-CM | POA: Insufficient documentation

## 2012-12-30 DIAGNOSIS — M899 Disorder of bone, unspecified: Secondary | ICD-10-CM | POA: Insufficient documentation

## 2012-12-30 LAB — COMPREHENSIVE METABOLIC PANEL (CC13)
ALT: 10 U/L (ref 0–55)
AST: 15 U/L (ref 5–34)
Albumin: 3 g/dL — ABNORMAL LOW (ref 3.5–5.0)
Anion Gap: 9 mEq/L (ref 3–11)
CO2: 27 mEq/L (ref 22–29)
Calcium: 9.9 mg/dL (ref 8.4–10.4)
Chloride: 103 mEq/L (ref 98–109)
Creatinine: 0.7 mg/dL (ref 0.6–1.1)
Potassium: 3.9 mEq/L (ref 3.5–5.1)

## 2012-12-30 LAB — CBC WITH DIFFERENTIAL/PLATELET
BASO%: 0.7 % (ref 0.0–2.0)
Basophils Absolute: 0.1 10*3/uL (ref 0.0–0.1)
HCT: 39.7 % (ref 34.8–46.6)
HGB: 12.9 g/dL (ref 11.6–15.9)
MCHC: 32.6 g/dL (ref 31.5–36.0)
MONO#: 0.7 10*3/uL (ref 0.1–0.9)
NEUT%: 79.8 % — ABNORMAL HIGH (ref 38.4–76.8)
RDW: 21.8 % — ABNORMAL HIGH (ref 11.2–14.5)
WBC: 7.9 10*3/uL (ref 3.9–10.3)
lymph#: 0.8 10*3/uL — ABNORMAL LOW (ref 0.9–3.3)

## 2012-12-30 LAB — RESEARCH LABS

## 2012-12-30 MED ORDER — IOHEXOL 300 MG/ML  SOLN
80.0000 mL | Freq: Once | INTRAMUSCULAR | Status: AC | PRN
  Administered 2012-12-30: 80 mL via INTRAVENOUS

## 2013-01-01 ENCOUNTER — Encounter: Payer: Self-pay | Admitting: Internal Medicine

## 2013-01-01 ENCOUNTER — Ambulatory Visit (HOSPITAL_BASED_OUTPATIENT_CLINIC_OR_DEPARTMENT_OTHER): Payer: Self-pay | Admitting: Internal Medicine

## 2013-01-01 ENCOUNTER — Encounter (INDEPENDENT_AMBULATORY_CARE_PROVIDER_SITE_OTHER): Payer: Self-pay

## 2013-01-01 ENCOUNTER — Encounter: Payer: Self-pay | Admitting: *Deleted

## 2013-01-01 DIAGNOSIS — C3410 Malignant neoplasm of upper lobe, unspecified bronchus or lung: Secondary | ICD-10-CM

## 2013-01-01 DIAGNOSIS — C7931 Secondary malignant neoplasm of brain: Secondary | ICD-10-CM

## 2013-01-01 DIAGNOSIS — R911 Solitary pulmonary nodule: Secondary | ICD-10-CM

## 2013-01-01 DIAGNOSIS — F17200 Nicotine dependence, unspecified, uncomplicated: Secondary | ICD-10-CM

## 2013-01-01 DIAGNOSIS — K7689 Other specified diseases of liver: Secondary | ICD-10-CM

## 2013-01-01 NOTE — Progress Notes (Signed)
The pt was in the Cancer Center this am for her md appt with [name]. I, the Arts development officer, met with the pt and gave her the questionnaires (PRO's) upon arrival at the cancer center. The pt completed the PRO's (first the EQ-5D-3L then the LCSS booklet). I then reviewed and checked the PRO's for completeness. No research blood was drawn today. The pt was thanked for her willingness to meet with me and for her continued participation in the clinical trial.

## 2013-01-01 NOTE — Progress Notes (Signed)
[facility] [phone]   [phone]  OFFICE PROGRESS NOTE  [name], MD [address] [place] [address]  DIAGNOSIS: Metastatic non-small cell lung cancer, adenocarcinoma with negative EGFR mutation and negative ALK gene translocation diagnosed in September of 2013 with metastatic single brain lesion.   PRIOR THERAPY:  1) Status post stereotactic radiosurgery to the single brain lesion under the care Dr. Kathrynn Running.  2) Concurrent chemoradiation with chemotherapy in the form of weekly carboplatin for an AUC of 2 and paclitaxel at 45 mg per meter squared concurrent with radiation therapy under the care Dr. Kathrynn Running with partial response in her disease.  3) Systemic chemotherapy with carboplatin for AUC of 5 and Alimta 500 mg/M2 every 3 weeks. From cycle 3 forward she is receiving carboplatin for an AUC of 4 and Alimta at 375 mg per meter squared due to thrombocytopenia. Status post a total of 6 cycles with evidence for disease progression.  4) Subxiphoid pericardial window for drainage of pericardial effusion under the care of Dr. Donata Clay on 08/28/2012 and the final pathology showed no malignant cells in the pleural fluid.   CURRENT THERAPY: Tarceva 150 mg by mouth daily. Therapy started 09/30/2012.   CHEMOTHERAPY INTENT: Palliative  CURRENT # OF CHEMOTHERAPY CYCLES: Approximately 3 monthsof Tarceva  CURRENT ANTIEMETICS: None  CURRENT SMOKING STATUS: Current smoker  ORAL CHEMOTHERAPY AND CONSENT: yes  CURRENT BISPHOSPHONATES USE: none  PAIN MANAGEMENT: Percocet  NARCOTICS INDUCED CONSTIPATION: none  LIVING WILL AND CODE STATUS: No CODE BLUE.   INTERVAL HISTORY: Melanie Liu 62 y.o. female returns to the clinic today for followup visit accompanied by her daughter and son. The patient is feeling fine today with no specific complaints. She is tolerating her current treatment was Tarceva fairly well with no significant adverse effects.  The patient denied having any chest pain, shortness breath, cough or hemoptysis. She denied having any weight loss or night sweats. She has no skin rash or diarrhea. The patient had repeat CT scan of the chest, abdomen and pelvis performed recently and she is here for evaluation and discussion of her scan results.  MEDICAL HISTORY: Past Medical History  Diagnosis Date  . Nodule of right lung     right upper lobe  . Anemia   . Rectovaginal fistula   . Right frontal lobe lesion 12/13/11    CT head  . History of radiation therapy 12/24/11-02/08/12    rul 66Gy/43fxs  . Dysphagia   . History of radiation therapy 12/27/11    SRS rt ant frontal 20gy  . Lung cancer 11/23/11    biopsy-right  . Brain metastases   . Chest pain at rest 08/26/2012  . COPD (chronic obstructive pulmonary disease)   . Shortness of breath     ALLERGIES:  is allergic to carboplatin and levaquin.  MEDICATIONS:  Current Outpatient Prescriptions  Medication Sig Dispense Refill  . dexamethasone (DECADRON) 1 MG tablet Take 1 tablet (1 mg total) by mouth 2 (two) times daily with a meal.  10 tablet  0  . erlotinib (TARCEVA) 150 MG tablet Take 1 tablet (150 mg total) by mouth daily. Take on an empty stomach 1 hour before meals or 2 hours after.  30 tablet  1  . ipratropium (ATROVENT) 0.02 % nebulizer solution Take 500 mcg by nebulization every 4 (four) hours as needed for wheezing.      . levETIRAcetam (KEPPRA) 750 MG tablet Take 1 tablet (750 mg total)  by mouth 2 (two) times daily.  60 tablet  0  . nicotine (NICODERM CQ - DOSED IN MG/24 HOURS) 21 mg/24hr patch Place 1 patch onto the skin daily.      [name] oxyCODONE-acetaminophen (PERCOCET/ROXICET) 5-325 MG per tablet Take 1 tablet by mouth every 4 (four) hours as needed for pain.  60 tablet  0  . pentoxifylline (TRENTAL) 400 MG CR tablet Take 1 tablet (400 mg total) by mouth 2 (two) times daily with a meal.  60 tablet  5  . sucralfate (CARAFATE) 1 G tablet Take 1 g by mouth  daily.      . vitamin E (VITAMIN E) 400 UNIT capsule Take 1 capsule (400 Units total) by mouth 2 (two) times daily.  60 capsule  5  . albuterol (PROVENTIL) (5 MG/ML) 0.5% nebulizer solution Take 2.5 mg by nebulization every 6 (six) hours as needed for wheezing.      . prochlorperazine (COMPAZINE) 10 MG tablet Take 10 mg by mouth every 6 (six) hours as needed.      . promethazine (PHENERGAN) 25 MG tablet Take 25 mg by mouth every 6 (six) hours as needed for nausea.       No current facility-administered medications for this visit.    SURGICAL HISTORY:  Past Surgical History  Procedure Laterality Date  . Partial hysterectomy    . Hemmorhoid surgery    . Neck surgery    . Retinal detachment surgery    . Esophagogastroduodenoscopy N/A 05/01/2012    Procedure: ESOPHAGOGASTRODUODENOSCOPY (EGD);  Surgeon: Shirley Friar, MD;  Location: [facility];  Service: Endoscopy;  Laterality: N/A;  . Subxyphoid pericardial window N/A 08/28/2012    Procedure: SUBXYPHOID PERICARDIAL WINDOW;  Surgeon: Kerin Perna, MD;  Location: [facility] OR;  Service: Thoracic;  Laterality: N/A;  . Tee without cardioversion  08/28/2012    Procedure: TRANSESOPHAGEAL ECHOCARDIOGRAM (TEE);  Surgeon: Kerin Perna, MD;  Location: [facility] OR;  Service: Thoracic;;  . Eye surgery    . Tubal ligation    . Mass excision Right 09/26/2012    Procedure: EXCISION MASS;  Surgeon: Kerin Perna, MD;  Location: [facility] OR;  Service: Vascular;  Laterality: Right;  EXCISION OF RIGHT NECK  MASS    REVIEW OF SYSTEMS:  Constitutional: negative Eyes: negative Ears, nose, mouth, throat, and face: negative Respiratory: negative Cardiovascular: negative Gastrointestinal: negative Genitourinary:negative Integument/breast: negative Hematologic/lymphatic: negative Musculoskeletal:negative Neurological: negative Behavioral/Psych: negative Endocrine: negative Allergic/Immunologic: negative   PHYSICAL EXAMINATION: General appearance: alert,  cooperative and no distress Head: Normocephalic, without obvious abnormality, atraumatic Neck: no adenopathy, no JVD, supple, symmetrical, trachea midline and thyroid not enlarged, symmetric, no tenderness/mass/nodules Lymph nodes: Cervical, supraclavicular, and axillary nodes normal. Resp: clear to auscultation bilaterally Back: symmetric, no curvature. ROM normal. No CVA tenderness. Cardio: regular rate and rhythm, S1, S2 normal, no murmur, click, rub or gallop GI: soft, non-tender; bowel sounds normal; no masses,  no organomegaly Extremities: extremities normal, atraumatic, no cyanosis or edema Neurologic: Alert and oriented X 3, normal strength and tone. Normal symmetric reflexes. Normal coordination and gait  ECOG PERFORMANCE STATUS: 1 - Symptomatic but completely ambulatory  Blood pressure 112/73, pulse 120, temperature 96.2 F (35.7 C), temperature source Oral, resp. rate 19, height 5\' 3"  (1.6 m), weight 104 lb 14.4 oz (47.582 kg).  LABORATORY DATA: Lab Results  Component Value Date   WBC 7.9 12/30/2012   HGB 12.9 12/30/2012   HCT 39.7 12/30/2012   MCV 89.8 12/30/2012   PLT 318 12/30/2012  Chemistry      Component Value Date/Time   NA 139 12/30/2012 0823   NA 135 11/20/2012 0917   K 3.9 12/30/2012 0823   K 4.0 11/20/2012 0917   CL 101 11/20/2012 0917   CL 102 08/18/2012 0938   CO2 27 12/30/2012 0823   CO2 22 11/20/2012 0917   BUN 17.7 12/30/2012 0823   BUN 16 11/20/2012 0917   CREATININE 0.7 12/30/2012 0823   CREATININE 0.61 11/20/2012 0917      Component Value Date/Time   CALCIUM 9.9 12/30/2012 0823   CALCIUM 9.8 11/20/2012 0917   ALKPHOS 141 12/30/2012 0823   ALKPHOS 103 11/20/2012 0415   AST 15 12/30/2012 0823   AST 15 11/20/2012 0415   ALT 10 12/30/2012 0823   ALT <5 11/20/2012 0415   BILITOT <0.20 12/30/2012 0823   BILITOT 0.2* 11/20/2012 0415       RADIOGRAPHIC STUDIES: Ct Chest W Contrast  12/30/2012   CLINICAL DATA:  Right-sided lung cancer.  Restaging of metastatic non-small-cell lung cancer. Radiation therapy finishing 12/13. Brain metastasis.  EXAM: CT CHEST, ABDOMEN, AND PELVIS WITH CONTRAST  TECHNIQUE: Multidetector CT imaging of the chest, abdomen and pelvis was performed following the standard protocol during bolus administration of intravenous contrast.  CONTRAST:  80mL OMNIPAQUE IOHEXOL 300 MG/ML  SOLN  COMPARISON:    Chest CT 08/27/2012. Abdominal pelvic CT most recent 08/22/2012.  FINDINGS: CT CHEST FINDINGS  Lungs/Pleura: Moderate centrilobular emphysema.  Marked enlargement of a right lower lobe subpleural nodule. This measures 1.3 cm on image 31 versus 4 mm on the prior exam.  Posterior right upper lobe lung mass is difficult to differentiate from surrounding airspace disease. On the order of 5.5 x 5.9 cm on image 13/series 5. Similar to 6.1 x 6.1 cm at the same level on the prior exam. Surrounding airspace disease which is minimally increased and likely due to a combination of infection and postobstructive pneumonitis.  Loculated circumferential right-sided pleural fluid suspected and not significantly changed. No left-sided pleural effusion  Heart/Mediastinum: No supraclavicular adenopathy. Normal heart size. Resolution of previously described large pericardial effusion. No central pulmonary embolism, on this non-dedicated study. No mediastinal adenopathy.    CT ABDOMEN AND PELVIS FINDINGS  Abdomen/Pelvis: An inferior right liver lobe lesion measures 4 mm on image 73/ series 2 and is less conspicuous than 7 mm on the prior exam. Low-density adjacent to falciform ligament is also less conspicuous today and measures 7 mm on image 64. Hepatomegaly, with liver measuring 20.6 cm craniocaudal. No new liver lesions are seen. Normal spleen, stomach, pancreas. Gallstones without acute cholecystitis or biliary ductal dilatation. Normal adrenal glands and right kidney. Too small to characterize left renal lesion.  Advanced aortic  atherosclerosis. No retroperitoneal or retrocrural adenopathy. Normal colon and terminal ileum. Normal small bowel without abdominal ascites. Question surgical changes in the right common iliac artery. No pelvic adenopathy. Tiny fat containing left inguinal hernia. Hysterectomy. Normal urinary bladder. No adnexal mass or significant free fluid.  Mild osteopenia. Lower cervical spine fixation. Disc bulges at L4-5 and L5-S1.    IMPRESSION: CT CHEST IMPRESSION  1. Interval enlargement of a right lower lobe lung nodule, consistent with metastatic disease. 2. Similar right upper lobe lung mass with slight increase in surrounding airspace disease. Infection and/or postobstructive pneumonitis. 3. Interval resolution of pericardial effusion.  CT ABDOMEN AND PELVIS IMPRESSION  1. Apparent slight decrease conspicuity of 2 liver lesions. The more inferior is favored to represent a tiny cyst and  was readily apparent on 11/14/2011 PET. The left liver lesion is suspicious for focal steatosis and warrants followup attention. 2. No new or progressive disease within the abdomen/ pelvis. 3. Cholelithiasis.   Electronically Signed   By: Jeronimo Greaves M.D.   On: 12/30/2012 10:50    ASSESSMENT AND PLAN:  This is a very pleasant 62 years old white female with metastatic non-small cell lung cancer, adenocarcinoma currently on treatment with Tarceva 150 mg by mouth daily status post 3 months of treatment. She is tolerating her treatment fairly well with no significant adverse effects.  She is currently on Keppra for history of seizure.  Her CT scan of the chest, abdomen and pelvis showed mixed response with improvement in the liver lesion but progression of the right lower lobe nodule as well as a slight increase in the right upper lobe mass. I discussed the scan results with the patient and her family. I also discussed treatment options with the patient including enrollment in the immunotherapy clinical trial with Nivolumab  versus continuous treatment with Tarceva and consideration of palliative radiotherapy to the right upper lobe lung nodule versus systemic chemotherapy. The patient would like to consider the immunotherapy and she would be seen by the clinical research nurse later today for evaluation of her eligibility. For smoke cessation, I strongly advise the patient to quit smoking and offered to smoke cessation program. The patient voices understanding of current disease status and treatment options and is in agreement with the current care plan.  All questions were answered. The patient knows to call the clinic with any problems, questions or concerns. We can certainly see the patient much sooner if necessary.  I spent 15 minutes counseling the patient face to face. The total time spent in the appointment was 25 minutes.

## 2013-01-02 ENCOUNTER — Encounter: Payer: Self-pay | Admitting: *Deleted

## 2013-01-02 ENCOUNTER — Telehealth: Payer: Self-pay | Admitting: *Deleted

## 2013-01-02 NOTE — Telephone Encounter (Signed)
01/02/2013 Spoke with patient by phone today to give her an update on screening status for the research study. Patient is aware that I will follow up with Dr. Kathrynn Running to reschedule the brain MRI to an earlier date than currently scheduled, if possible. Patient confirms that she has been taking Decadron 1mg  per day as prescribed by Dr. Kathrynn Running. She has two remaining doses of Tarceva and this will be finished on Sunday 11/2.  Patient states that she had her hysterectomy in 1988 at [facility], by Dr. Christie Beckers.  Explained to patient that the remaining study procedures will be scheduled following discussion with Dr. Kathrynn Running on Monday: brain MR, EKG and additional labs at [facility].  Cindy S. Clelia Croft BSN, RN, CCRP 01/02/2013 10:58 AM

## 2013-01-05 ENCOUNTER — Other Ambulatory Visit: Payer: Self-pay | Admitting: *Deleted

## 2013-01-06 ENCOUNTER — Telehealth: Payer: Self-pay | Admitting: Internal Medicine

## 2013-01-06 ENCOUNTER — Telehealth: Payer: Self-pay | Admitting: *Deleted

## 2013-01-06 NOTE — Telephone Encounter (Signed)
Per staff message and POF I have scheduled appts.  [name]  

## 2013-01-06 NOTE — Telephone Encounter (Signed)
s.w. pt and advisedo n 11.6.14 and 11.11.14 appts.

## 2013-01-07 ENCOUNTER — Encounter: Payer: Self-pay | Admitting: Internal Medicine

## 2013-01-07 ENCOUNTER — Ambulatory Visit
Admission: RE | Admit: 2013-01-07 | Discharge: 2013-01-07 | Disposition: A | Discharge status: Home / Self Care | Payer: Medicaid Other | Source: Ambulatory Visit | Attending: Radiation Oncology | Admitting: Radiation Oncology

## 2013-01-07 DIAGNOSIS — C7931 Secondary malignant neoplasm of brain: Secondary | ICD-10-CM

## 2013-01-07 MED ORDER — GADOBENATE DIMEGLUMINE 529 MG/ML IV SOLN
9.0000 mL | Freq: Once | INTRAVENOUS | Status: AC | PRN
  Administered 2013-01-07: 9 mL via INTRAVENOUS

## 2013-01-07 NOTE — Progress Notes (Signed)
[place], [phone], states the patient informed them that she will no longer take tarceva so they are closing out her case.

## 2013-01-08 ENCOUNTER — Emergency Department (HOSPITAL_COMMUNITY): Payer: Medicaid Other

## 2013-01-08 ENCOUNTER — Ambulatory Visit
Admission: RE | Admit: 2013-01-08 | Discharge: 2013-01-08 | Disposition: A | Discharge status: Home / Self Care | Payer: Medicaid Other | Source: Ambulatory Visit | Attending: Radiation Oncology | Admitting: Radiation Oncology

## 2013-01-08 ENCOUNTER — Other Ambulatory Visit: Payer: Self-pay

## 2013-01-08 ENCOUNTER — Encounter (HOSPITAL_COMMUNITY): Payer: Self-pay | Admitting: Emergency Medicine

## 2013-01-08 ENCOUNTER — Encounter: Payer: Medicaid Other | Admitting: *Deleted

## 2013-01-08 ENCOUNTER — Inpatient Hospital Stay (HOSPITAL_COMMUNITY)
Admission: EM | Admit: 2013-01-08 | Discharge: 2013-01-10 | DRG: 054 | Disposition: A | Discharge status: Home / Self Care | Payer: Medicaid Other | Attending: Internal Medicine | Admitting: Internal Medicine

## 2013-01-08 ENCOUNTER — Other Ambulatory Visit (HOSPITAL_BASED_OUTPATIENT_CLINIC_OR_DEPARTMENT_OTHER): Payer: Medicaid Other | Admitting: Lab

## 2013-01-08 DIAGNOSIS — K222 Esophageal obstruction: Secondary | ICD-10-CM

## 2013-01-08 DIAGNOSIS — G936 Cerebral edema: Secondary | ICD-10-CM | POA: Diagnosis present

## 2013-01-08 DIAGNOSIS — C3490 Malignant neoplasm of unspecified part of unspecified bronchus or lung: Secondary | ICD-10-CM | POA: Diagnosis present

## 2013-01-08 DIAGNOSIS — D6481 Anemia due to antineoplastic chemotherapy: Secondary | ICD-10-CM | POA: Diagnosis present

## 2013-01-08 DIAGNOSIS — C7931 Secondary malignant neoplasm of brain: Secondary | ICD-10-CM

## 2013-01-08 DIAGNOSIS — Z720 Tobacco use: Secondary | ICD-10-CM

## 2013-01-08 DIAGNOSIS — IMO0002 Reserved for concepts with insufficient information to code with codable children: Secondary | ICD-10-CM

## 2013-01-08 DIAGNOSIS — R918 Other nonspecific abnormal finding of lung field: Secondary | ICD-10-CM

## 2013-01-08 DIAGNOSIS — J4489 Other specified chronic obstructive pulmonary disease: Secondary | ICD-10-CM | POA: Diagnosis present

## 2013-01-08 DIAGNOSIS — E440 Moderate protein-calorie malnutrition: Secondary | ICD-10-CM | POA: Diagnosis present

## 2013-01-08 DIAGNOSIS — Z888 Allergy status to other drugs, medicaments and biological substances status: Secondary | ICD-10-CM

## 2013-01-08 DIAGNOSIS — Z716 Tobacco abuse counseling: Secondary | ICD-10-CM

## 2013-01-08 DIAGNOSIS — E876 Hypokalemia: Secondary | ICD-10-CM | POA: Diagnosis present

## 2013-01-08 DIAGNOSIS — Z79899 Other long term (current) drug therapy: Secondary | ICD-10-CM

## 2013-01-08 DIAGNOSIS — F17200 Nicotine dependence, unspecified, uncomplicated: Secondary | ICD-10-CM | POA: Diagnosis present

## 2013-01-08 DIAGNOSIS — I313 Pericardial effusion (noninflammatory): Secondary | ICD-10-CM | POA: Diagnosis present

## 2013-01-08 DIAGNOSIS — Z923 Personal history of irradiation: Secondary | ICD-10-CM

## 2013-01-08 DIAGNOSIS — J441 Chronic obstructive pulmonary disease with (acute) exacerbation: Secondary | ICD-10-CM

## 2013-01-08 DIAGNOSIS — R569 Unspecified convulsions: Secondary | ICD-10-CM | POA: Diagnosis present

## 2013-01-08 DIAGNOSIS — R222 Localized swelling, mass and lump, trunk: Secondary | ICD-10-CM

## 2013-01-08 DIAGNOSIS — G9389 Other specified disorders of brain: Secondary | ICD-10-CM

## 2013-01-08 DIAGNOSIS — T451X5A Adverse effect of antineoplastic and immunosuppressive drugs, initial encounter: Secondary | ICD-10-CM | POA: Diagnosis present

## 2013-01-08 DIAGNOSIS — J449 Chronic obstructive pulmonary disease, unspecified: Secondary | ICD-10-CM | POA: Diagnosis present

## 2013-01-08 DIAGNOSIS — G939 Disorder of brain, unspecified: Secondary | ICD-10-CM

## 2013-01-08 DIAGNOSIS — J189 Pneumonia, unspecified organism: Secondary | ICD-10-CM

## 2013-01-08 DIAGNOSIS — R1310 Dysphagia, unspecified: Secondary | ICD-10-CM

## 2013-01-08 DIAGNOSIS — R079 Chest pain, unspecified: Secondary | ICD-10-CM

## 2013-01-08 DIAGNOSIS — R29898 Other symptoms and signs involving the musculoskeletal system: Secondary | ICD-10-CM | POA: Diagnosis present

## 2013-01-08 DIAGNOSIS — R531 Weakness: Secondary | ICD-10-CM

## 2013-01-08 DIAGNOSIS — D696 Thrombocytopenia, unspecified: Secondary | ICD-10-CM

## 2013-01-08 LAB — COMPREHENSIVE METABOLIC PANEL (CC13)
Albumin: 3.1 g/dL — ABNORMAL LOW (ref 3.5–5.0)
Alkaline Phosphatase: 134 U/L (ref 40–150)
Anion Gap: 12 mEq/L — ABNORMAL HIGH (ref 3–11)
CO2: 25 mEq/L (ref 22–29)
Creatinine: 0.7 mg/dL (ref 0.6–1.1)
Glucose: 100 mg/dl (ref 70–140)
Potassium: 3.9 mEq/L (ref 3.5–5.1)
Sodium: 142 mEq/L (ref 136–145)
Total Bilirubin: 0.31 mg/dL (ref 0.20–1.20)
Total Protein: 6.9 g/dL (ref 6.4–8.3)

## 2013-01-08 LAB — URINALYSIS, ROUTINE W REFLEX MICROSCOPIC
Glucose, UA: NEGATIVE mg/dL
Hgb urine dipstick: NEGATIVE
Leukocytes, UA: NEGATIVE
Nitrite: NEGATIVE
Protein, ur: NEGATIVE mg/dL
Specific Gravity, Urine: 1.026 (ref 1.005–1.030)
pH: 6.5 (ref 5.0–8.0)

## 2013-01-08 LAB — CBC WITH DIFFERENTIAL/PLATELET
BASO%: 0.8 % (ref 0.0–2.0)
Basophils Absolute: 0.1 10*3/uL (ref 0.0–0.1)
Basophils Absolute: 0 10*3/uL (ref 0.0–0.1)
Basophils Relative: 0 % (ref 0–1)
Eosinophils Absolute: 0.1 10*3/uL (ref 0.0–0.5)
Eosinophils Relative: 1 % (ref 0–5)
HCT: 35.2 % (ref 34.8–46.6)
HCT: 34.4 % — ABNORMAL LOW (ref 36.0–46.0)
HGB: 11.5 g/dL — ABNORMAL LOW (ref 11.6–15.9)
Hemoglobin: 11 g/dL — ABNORMAL LOW (ref 12.0–15.0)
LYMPH%: 8.4 % — ABNORMAL LOW (ref 14.0–49.7)
MCH: 29.2 pg (ref 26.0–34.0)
MCHC: 32.8 g/dL (ref 31.5–36.0)
MCHC: 32 g/dL (ref 30.0–36.0)
MCV: 91.2 fL (ref 78.0–100.0)
MONO#: 1 10*3/uL — ABNORMAL HIGH (ref 0.1–0.9)
Monocytes Absolute: 0.9 10*3/uL (ref 0.1–1.0)
NEUT%: 78 % — ABNORMAL HIGH (ref 38.4–76.8)
Neutro Abs: 6.3 10*3/uL (ref 1.7–7.7)
Platelets: 341 10*3/uL (ref 145–400)
RDW: 19.6 % — ABNORMAL HIGH (ref 11.5–15.5)
WBC: 8.3 10*3/uL (ref 3.9–10.3)
lymph#: 0.7 10*3/uL — ABNORMAL LOW (ref 0.9–3.3)

## 2013-01-08 LAB — LACTATE DEHYDROGENASE (CC13): LDH: 163 U/L (ref 125–245)

## 2013-01-08 LAB — POCT I-STAT, CHEM 8
BUN: 10 mg/dL (ref 6–23)
Calcium, Ion: 1.22 mmol/L (ref 1.13–1.30)
Chloride: 101 mEq/L (ref 96–112)
Glucose, Bld: 95 mg/dL (ref 70–99)
HCT: 35 % — ABNORMAL LOW (ref 36.0–46.0)
Potassium: 3.4 mEq/L — ABNORMAL LOW (ref 3.5–5.1)

## 2013-01-08 LAB — POCT I-STAT TROPONIN I: Troponin i, poc: 0 ng/mL (ref 0.00–0.08)

## 2013-01-08 LAB — BASIC METABOLIC PANEL
BUN: 10 mg/dL (ref 6–23)
CO2: 27 mEq/L (ref 19–32)
Chloride: 101 mEq/L (ref 96–112)
GFR calc non Af Amer: >90 mL/min (ref >90)
Glucose, Bld: 102 mg/dL — ABNORMAL HIGH (ref 70–99)
Potassium: 3.3 mEq/L — ABNORMAL LOW (ref 3.5–5.1)

## 2013-01-08 LAB — RAPID URINE DRUG SCREEN, HOSP PERFORMED
Amphetamines: NOT DETECTED
Barbiturates: NOT DETECTED
Opiates: NOT DETECTED

## 2013-01-08 LAB — GLUCOSE, CAPILLARY: Glucose-Capillary: 89 mg/dL (ref 70–99)

## 2013-01-08 LAB — ETHANOL: Alcohol, Ethyl (B): <11 mg/dL (ref 0–11)

## 2013-01-08 LAB — PROTIME-INR
INR: 0.95 (ref 0.00–1.49)
Prothrombin Time: 12.5 seconds (ref 11.6–15.2)

## 2013-01-08 LAB — TROPONIN I: Troponin I: <0.3 ng/mL (ref <0.30)

## 2013-01-08 LAB — RESEARCH LABS

## 2013-01-08 LAB — TSH CHCC: TSH: 4.672 m[IU]/L — ABNORMAL HIGH (ref 0.308–3.960)

## 2013-01-08 LAB — APTT: aPTT: 30 seconds (ref 24–37)

## 2013-01-08 MED ORDER — SODIUM CHLORIDE 0.9 % IJ SOLN
3.0000 mL | Freq: Two times a day (BID) | INTRAMUSCULAR | Status: DC
  Administered 2013-01-08: 3 mL via INTRAVENOUS

## 2013-01-08 MED ORDER — PENTOXIFYLLINE ER 400 MG PO TBCR
400.0000 mg | EXTENDED_RELEASE_TABLET | Freq: Two times a day (BID) | ORAL | Status: DC
  Administered 2013-01-09 – 2013-01-10 (×3): 400 mg via ORAL
  Filled 2013-01-08 (×5): qty 1

## 2013-01-08 MED ORDER — LORAZEPAM 1 MG PO TABS
1.0000 mg | ORAL_TABLET | Freq: Once | ORAL | Status: AC
  Administered 2013-01-08: 1 mg via ORAL
  Filled 2013-01-08: qty 1

## 2013-01-08 MED ORDER — IPRATROPIUM BROMIDE 0.02 % IN SOLN
500.0000 ug | RESPIRATORY_TRACT | Status: DC | PRN
  Administered 2013-01-09 – 2013-01-10 (×2): 500 ug via RESPIRATORY_TRACT
  Filled 2013-01-08 (×2): qty 2.5

## 2013-01-08 MED ORDER — OXYCODONE-ACETAMINOPHEN 5-325 MG PO TABS
1.0000 | ORAL_TABLET | ORAL | Status: DC | PRN
  Administered 2013-01-10: 1 via ORAL
  Filled 2013-01-08: qty 1

## 2013-01-08 MED ORDER — PROMETHAZINE HCL 25 MG PO TABS: 25.0000 mg | ORAL_TABLET | Freq: Four times a day (QID) | ORAL | Status: DC | PRN

## 2013-01-08 MED ORDER — SODIUM CHLORIDE 0.9 % IV SOLN
1000.0000 mg | Freq: Once | INTRAVENOUS | Status: AC
  Administered 2013-01-09: 1000 mg via INTRAVENOUS
  Filled 2013-01-08: qty 10

## 2013-01-08 MED ORDER — SODIUM CHLORIDE 0.9 % IV SOLN
250.0000 mL | INTRAVENOUS | Status: DC | PRN
  Administered 2013-01-09: 250 mL via INTRAVENOUS

## 2013-01-08 MED ORDER — VITAMIN E 180 MG (400 UNIT) PO CAPS
400.0000 [IU] | ORAL_CAPSULE | Freq: Two times a day (BID) | ORAL | Status: DC
  Administered 2013-01-09 – 2013-01-10 (×4): 400 [IU] via ORAL
  Filled 2013-01-08 (×5): qty 1

## 2013-01-08 MED ORDER — SUCRALFATE 1 G PO TABS
1.0000 g | ORAL_TABLET | Freq: Every day | ORAL | Status: DC
  Administered 2013-01-09 – 2013-01-10 (×2): 1 g via ORAL
  Filled 2013-01-08 (×2): qty 1

## 2013-01-08 MED ORDER — SODIUM CHLORIDE 0.9 % IJ SOLN
3.0000 mL | Freq: Two times a day (BID) | INTRAMUSCULAR | Status: DC
  Administered 2013-01-09 (×2): 3 mL via INTRAVENOUS

## 2013-01-08 MED ORDER — PROCHLORPERAZINE MALEATE 10 MG PO TABS
10.0000 mg | ORAL_TABLET | Freq: Four times a day (QID) | ORAL | Status: DC | PRN
  Filled 2013-01-08: qty 1

## 2013-01-08 MED ORDER — DEXAMETHASONE 4 MG PO TABS
8.0000 mg | ORAL_TABLET | Freq: Three times a day (TID) | ORAL | Status: DC
  Administered 2013-01-08 – 2013-01-10 (×6): 8 mg via ORAL
  Filled 2013-01-08 (×8): qty 2

## 2013-01-08 MED ORDER — SODIUM CHLORIDE 0.9 % IJ SOLN: 3.0000 mL | INTRAMUSCULAR | Status: DC | PRN

## 2013-01-08 MED ORDER — LEVETIRACETAM 750 MG PO TABS: 750.0000 mg | ORAL_TABLET | Freq: Two times a day (BID) | ORAL | Status: DC

## 2013-01-08 MED ORDER — ALBUTEROL SULFATE (5 MG/ML) 0.5% IN NEBU
2.5000 mg | INHALATION_SOLUTION | Freq: Four times a day (QID) | RESPIRATORY_TRACT | Status: DC | PRN
  Administered 2013-01-09 – 2013-01-10 (×2): 2.5 mg via RESPIRATORY_TRACT
  Filled 2013-01-08 (×2): qty 0.5

## 2013-01-08 MED ORDER — MORPHINE SULFATE 4 MG/ML IJ SOLN
4.0000 mg | Freq: Once | INTRAMUSCULAR | Status: AC
  Administered 2013-01-08: 4 mg via INTRAVENOUS
  Filled 2013-01-08: qty 1

## 2013-01-08 NOTE — ED Provider Notes (Signed)
CSN: [id number]     Arrival date & time 01/08/13  1707 History   First MD Initiated Contact with Patient 01/08/13 1713     Chief Complaint  Patient presents with  . Seizures   (Consider location/radiation/quality/duration/timing/severity/associated sxs/prior Treatment) HPI Comments: Patient presents emergency department with chief complaint of weakness. She states that her left arm has been weak since last night. Last normal last night. She states that she was coming to the hospital today, when she reports having a seizure. She states that she has not been taking her seizure medication today, because she was running late period. She had 2 seizures while in route to the hospital. She is received 1 mg of Ativan. She has an extensive and complicated medical history, including lung cancer, liver cancer, and brain cancer. She is followed by Dr. Gwenyth Bouillon. She is receiving chemotherapy. She also endorses chest pain, but denies shortness of breath.  The history is provided by the patient. No language interpreter was used.    Past Medical History  Diagnosis Date  . Nodule of right lung     right upper lobe  . Anemia   . Rectovaginal fistula   . Right frontal lobe lesion 12/13/11    CT head  . History of radiation therapy 12/24/11-02/08/12    rul 66Gy/45fxs  . Dysphagia   . History of radiation therapy 12/27/11    SRS rt ant frontal 20gy  . Lung cancer 11/23/11    biopsy-right  . Brain metastases   . Chest pain at rest 08/26/2012  . COPD (chronic obstructive pulmonary disease)   . Shortness of breath    Past Surgical History  Procedure Laterality Date  . Partial hysterectomy    . Hemmorhoid surgery    . Neck surgery    . Retinal detachment surgery    . Esophagogastroduodenoscopy N/A 05/01/2012    Procedure: ESOPHAGOGASTRODUODENOSCOPY (EGD);  Surgeon: Shirley Friar, MD;  Location: [facility];  Service: Endoscopy;  Laterality: N/A;  . Subxyphoid pericardial window N/A 08/28/2012     Procedure: SUBXYPHOID PERICARDIAL WINDOW;  Surgeon: Kerin Perna, MD;  Location: [facility] OR;  Service: Thoracic;  Laterality: N/A;  . Tee without cardioversion  08/28/2012    Procedure: TRANSESOPHAGEAL ECHOCARDIOGRAM (TEE);  Surgeon: Kerin Perna, MD;  Location: [facility] OR;  Service: Thoracic;;  . Eye surgery    . Tubal ligation    . Mass excision Right 09/26/2012    Procedure: EXCISION MASS;  Surgeon: Kerin Perna, MD;  Location: [facility] OR;  Service: Vascular;  Laterality: Right;  EXCISION OF RIGHT NECK  MASS  . Abdominal hysterectomy  1988    [facility] - Dr. Christie Beckers   Family History  Problem Relation Age of Onset  . Heart disease Father   . Kidney disease Mother   . Alzheimer's disease Sister   . Alzheimer's disease Brother    History  Substance Use Topics  . Smoking status: Current Every Day Smoker -- 0.50 packs/day for 43 years    Types: Cigarettes  . Smokeless tobacco: Never Used  . Alcohol Use: No   OB History   Grav Para Term Preterm Abortions TAB SAB Ect Mult Living                 Review of Systems  All other systems reviewed and are negative.    Allergies  Carboplatin and Levaquin  Home Medications   Current Outpatient Rx  Name  Route  Sig  Dispense  Refill  .  albuterol (PROVENTIL) (5 MG/ML) 0.5% nebulizer solution   Nebulization   Take 2.5 mg by nebulization every 6 (six) hours as needed for wheezing.         [name] dexamethasone (DECADRON) 4 MG tablet   Oral   Take 4 mg by mouth daily. Patient takes 1 mg (1/4 of a tablet) daily; dose resumed following 11/21/2012 hospital discharge dose of 1mg  BID x 5 days.         [name] ipratropium (ATROVENT) 0.02 % nebulizer solution   Nebulization   Take 500 mcg by nebulization every 4 (four) hours as needed for wheezing.         . levETIRAcetam (KEPPRA) 750 MG tablet   Oral   Take 1 tablet (750 mg total) by mouth 2 (two) times daily.   60 tablet   0   . nicotine (NICODERM CQ - DOSED IN MG/24 HOURS) 21 mg/24hr patch    Transdermal   Place 1 patch onto the skin daily.         [name] oxyCODONE-acetaminophen (PERCOCET/ROXICET) 5-325 MG per tablet   Oral   Take 1 tablet by mouth every 4 (four) hours as needed for pain.   60 tablet   0   . pentoxifylline (TRENTAL) 400 MG CR tablet   Oral   Take 1 tablet (400 mg total) by mouth 2 (two) times daily with a meal.   60 tablet   5   . prochlorperazine (COMPAZINE) 10 MG tablet   Oral   Take 10 mg by mouth every 6 (six) hours as needed (nausea).          . promethazine (PHENERGAN) 25 MG tablet   Oral   Take 25 mg by mouth every 6 (six) hours as needed for nausea.         . sucralfate (CARAFATE) 1 G tablet   Oral   Take 1 g by mouth daily.         . vitamin E (VITAMIN E) 400 UNIT capsule   Oral   Take 1 capsule (400 Units total) by mouth 2 (two) times daily.   60 capsule   5    BP 116/72  Pulse 102  Temp(Src) 98.8 F (37.1 C) (Oral)  Resp 16  SpO2 100% Physical Exam  Nursing note and vitals reviewed. Constitutional: She is oriented to person, place, and time. She appears well-developed and well-nourished.  HENT:  Head: Normocephalic and atraumatic.  Eyes: Conjunctivae and EOM are normal. Pupils are equal, round, and reactive to light.  Neck: Normal range of motion. Neck supple.  Cardiovascular: Normal rate and regular rhythm.  Exam reveals no gallop and no friction rub.   No murmur heard. Pulmonary/Chest: Effort normal and breath sounds normal. No respiratory distress. She has no wheezes. She has no rales. She exhibits no tenderness.  Abdominal: Soft. Bowel sounds are normal. She exhibits no distension and no mass. There is no tenderness. There is no rebound and no guarding.  Musculoskeletal: Normal range of motion. She exhibits no edema and no tenderness.  Neurological: She is alert and oriented to person, place, and time.  CN 3-12 intact, sensation intact, upper left extremity strength is reduced.  Skin: Skin is warm and dry.   Psychiatric: She has a normal mood and affect. Her behavior is normal. Judgment and thought content normal.    ED Course  Procedures (including critical care time) Labs Review Labs Reviewed  CBC WITH DIFFERENTIAL  BASIC METABOLIC PANEL  LEVETIRACETAM LEVEL  ETHANOL  PROTIME-INR  APTT  TROPONIN I  URINE RAPID DRUG SCREEN (HOSP PERFORMED)  URINALYSIS, ROUTINE W REFLEX MICROSCOPIC   Imaging Review Mr Lodema Pilot Contrast  01/07/2013   CLINICAL DATA:  Metastatic lung cancer. Stereotactic radiosurgery, restaging.  EXAM: MRI HEAD WITHOUT AND WITH CONTRAST  TECHNIQUE: Multiplanar, multiecho pulse sequences of the brain and surrounding structures were obtained according to standard protocol without and with intravenous contrast  CONTRAST:  9mL MULTIHANCE GADOBENATE DIMEGLUMINE 529 MG/ML IV SOLN  COMPARISON:  MR head 10/28/2012 most recent. CT head 11/20/2012 also recent.  FINDINGS: Interval growth of 3 treated intracranial lesions is accompanied by a increasing edema and central restricted diffusion of the 2 largest lesions:  Right inferior frontal lobe now 6 x 9 mm as compared with 5 x 7 previously, with moderate surrounding edema.  Right parietal periatrial lesion now 16 x 17 mm as compared with 12 x 13 mm previously. Increased echogenic edema.  Right anterior parietal convexity near parasagittal metastasis now 20 x 23 mm as compared with 15 x 14 mm, also with significant vasogenic edema worsening.  No enhancing left hemisphere lesions observed.  No new metastatic deposits are observed. Unchanged atrophy and small vessel disease. No osseous lesions. 1-2 mm midline shift.  IMPRESSION: Interval worsening particularly of the two right parietal lesions. Correlate clinically for post treatment change versus progression of metastatic disease.   Electronically Signed   By: Davonna Belling M.D.   On: 01/07/2013 16:14    EKG Interpretation   None       MDM   1. Seizures      Patient discussed with  Dr. Effie Shy, who will see the patient.  Patient with metastatic disease. She has lung cancer, liver cancer, and brain cancer. Recently underwent stereotactic radiosurgery for her metastases on her brain. Today she has left-sided seizures, as well as left-sided numbness and weakness.  Discussed patient with oncology, who recommends increasing the Decadron to 8 mg 3 times a day, and observing the patient overnight with hospitalist admission. Oncology also recommends neurology consult.  9:59 PM Discussed the patient with Dr. Onalee Hua, from [facility], who will admit the patient.  Discussed the patient with Dr. Thad Ranger, who will consult.  Roxy Horseman, PA-C 01/08/13 2159

## 2013-01-08 NOTE — ED Notes (Signed)
Bed: WA07 Expected date:  Expected time:  Means of arrival:  Comments: EMS seizure 

## 2013-01-08 NOTE — Progress Notes (Signed)
01/08/2013 Patient in to clinic today for Radiation Oncology evaluation, labwork and EKG for protocol screening. Patient states that she overslept, and Radiation Oncology appointment had to be rescheduled, per RO staff. Spoke with Sheldon Silvan RN who states that Dr. Kathrynn Running is unable to see patient today due to scheduling, however, she will request feedback from materials sent to and reviewed by Dr. Kathrynn Running this morning, including review and evaluation of MRI results from 11/5, and confirmation of continued Decadron dosing at 1mg  daily.  Patient proceeded to lab for collection of screening bloods tests as well as research PGx testing to be performed during screening per protocol Patient then proceeded to exam room area for completion of O2 saturation, done at rest on room air, and EKG.  Patient aware that Cycle 1 study evaluations and treatment are scheduled for next Tuesday, November 11th, unless new information is obtained indicating that she is not eligible to participate.  Cindy S. Clelia Croft BSN, RN, CCRP 01/08/2013 10:55 AM

## 2013-01-08 NOTE — ED Provider Notes (Signed)
  Face-to-face evaluation   History: She had insidious onset of headache and numbness of the left. After that she had a seizure while riding in a car. It was characterized by jerking of the left arm and leg. The jerking lasted 5 minutes and was followed by a minute and a half of confusion. This process recurred once. She was treated by EMS and transferred. She received Ativan. In emergency department. She is alert, calm, and cooperative. She complains of abdominal pain since eating sausage this morning. She is on Decadron for intracranial swelling  Physical exam: Alert, elderly, appears older than stated age. Neurologic is grossly nonfocal.  Medical screening examination/treatment/procedure(s) were conducted as a shared visit with non-physician practitioner(s) and myself.  I personally evaluated the patient during the encounter  Flint Melter, MD 01/08/13 2258

## 2013-01-08 NOTE — Progress Notes (Signed)
  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 She received 66 Gy in 33 fractions to the primary tumor in the right upper lobe of the lung 12/24/2011-02/08/2012  3. Stereotactic radiosurgery to her new Lt Frontal 5 mm and Rt Parietal 18 mm mets 04/14/2012  4. SRS to a 4.8 mm right parietal brain metastasis to a prescription dose of 20 Gy.  Interval Since Last Radiation:  5  months  Narrative:  The patient was scheduled today for routine follow-up.  But, she arrived to the clinic late, and after I had scrubbed in for an operative brachytherapy procedure, so, I was unable to see her.  Her MRI shows some enlargement of treated brain metastases, suggestive of radiation effect or subclinical radionecrosis which is currently asymptomatic.  She denies headache nausea and vomiting. She is taking 1 mg dexamethasone daily.                   Radiographic Findings: I personally reviewed her MRI  Mr Lodema Pilot Contrast  01/07/2013   CLINICAL DATA:  Metastatic lung cancer. Stereotactic radiosurgery, restaging.  EXAM: MRI HEAD WITHOUT AND WITH CONTRAST  TECHNIQUE: Multiplanar, multiecho pulse sequences of the brain and surrounding structures were obtained according to standard protocol without and with intravenous contrast  CONTRAST:  9mL MULTIHANCE GADOBENATE DIMEGLUMINE 529 MG/ML IV SOLN  COMPARISON:  MR head 10/28/2012 most recent. CT head 11/20/2012 also recent.  FINDINGS: Interval growth of 3 treated intracranial lesions is accompanied by a increasing edema and central restricted diffusion of the 2 largest lesions:  Right inferior frontal lobe now 6 x 9 mm as  compared with 5 x 7 previously, with moderate surrounding edema.  Right parietal periatrial lesion now 16 x 17 mm as compared with 12 x 13 mm previously. Increased echogenic edema.  Right anterior parietal convexity near parasagittal metastasis now 20 x 23 mm as compared with 15 x 14 mm, also with significant vasogenic edema worsening.  No enhancing left hemisphere lesions observed.  No new metastatic deposits are observed. Unchanged atrophy and small vessel disease. No osseous lesions. 1-2 mm midline shift.  IMPRESSION: Interval worsening particularly of the two right parietal lesions. Correlate clinically for post treatment change versus progression of metastatic disease.   Electronically Signed   By: Davonna Belling M.D.   On: 01/07/2013 16:14   Impression:  The patient has no evidence of active intracranial brain metastases.  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  Plan:  Re-schedule follow-up to review brain MRI.  _____________________________________  Artist Pais. Kathrynn Running, M.D.

## 2013-01-08 NOTE — ED Notes (Signed)
Pt's son states large amount of black mold found in pt's bathroom at home yesterday

## 2013-01-08 NOTE — ED Notes (Signed)
Per EMS: Pt was at cancer center earlier today for check-up.  Pt has spots on liver and lungs. Pt did not take seizure medication today because was running behind. Pt wasn't feeling well, son started driving her to hospital. Pt had 1 seizure in car with son, son called EMS. Pt had another seizure en route with EMS, given 1mg  Ativan.

## 2013-01-08 NOTE — H&P (Signed)
PCP:   [name], MD   Chief Complaint:  seizure  HPI: 62 yo female with lung cancer mets to brain s/p Stereotactic radiosurgery to her 19.2 mm rt frontal brain metastasis on 12/27/2011 already on keppra for one seizure associated with her brain met comes in with 2 left sided tonic/clonic type seizures that lasted about 4 minutes each.  No fevers.  Has been feeling well today.  No n/v/d.  No headache.  She started with some left sided weakness first then had sz soon after.  No sz since arrival to ED.  Denies any pain at this time.  No rashes.  No neck pain.   Review of Systems:  Positive and negative as per HPI otherwise all other systems are negative  Past Medical History: Past Medical History  Diagnosis Date  . Nodule of right lung     right upper lobe  . Anemia   . Rectovaginal fistula   . Right frontal lobe lesion 12/13/11    CT head  . History of radiation therapy 12/24/11-02/08/12    rul 66Gy/39fxs  . Dysphagia   . History of radiation therapy 12/27/11    SRS rt ant frontal 20gy  . Lung cancer 11/23/11    biopsy-right  . Brain metastases   . Chest pain at rest 08/26/2012  . COPD (chronic obstructive pulmonary disease)   . Shortness of breath    Past Surgical History  Procedure Laterality Date  . Partial hysterectomy    . Hemmorhoid surgery    . Neck surgery    . Retinal detachment surgery    . Esophagogastroduodenoscopy N/A 05/01/2012    Procedure: ESOPHAGOGASTRODUODENOSCOPY (EGD);  Surgeon: Shirley Friar, MD;  Location: [facility];  Service: Endoscopy;  Laterality: N/A;  . Subxyphoid pericardial window N/A 08/28/2012    Procedure: SUBXYPHOID PERICARDIAL WINDOW;  Surgeon: Kerin Perna, MD;  Location: [facility] OR;  Service: Thoracic;  Laterality: N/A;  . Tee without cardioversion  08/28/2012    Procedure: TRANSESOPHAGEAL ECHOCARDIOGRAM (TEE);  Surgeon: Kerin Perna, MD;  Location: [facility] OR;  Service: Thoracic;;  . Eye surgery    . Tubal ligation    .  Mass excision Right 09/26/2012    Procedure: EXCISION MASS;  Surgeon: Kerin Perna, MD;  Location: [facility] OR;  Service: Vascular;  Laterality: Right;  EXCISION OF RIGHT NECK  MASS  . Abdominal hysterectomy  1988    [facility] - Dr. Christie Beckers    Medications: Prior to Admission medications   Medication Sig Start Date End Date Taking? Authorizing Provider  albuterol (PROVENTIL) (5 MG/ML) 0.5% nebulizer solution Take 2.5 mg by nebulization every 6 (six) hours as needed for wheezing.   Yes Historical Provider, MD  dexamethasone (DECADRON) 4 MG tablet Take 4 mg by mouth daily. Patient takes 1 mg (1/4 of a tablet) daily; dose resumed following 11/21/2012 hospital discharge dose of 1mg  BID x 5 days. 11/27/12  Yes [name], MD  ipratropium (ATROVENT) 0.02 % nebulizer solution Take 500 mcg by nebulization every 4 (four) hours as needed for wheezing.   Yes Historical Provider, MD  levETIRAcetam (KEPPRA) 750 MG tablet Take 1 tablet (750 mg total) by mouth 2 (two) times daily. 11/21/12  Yes Clanford Cyndie Mull, MD  nicotine (NICODERM CQ - DOSED IN MG/24 HOURS) 21 mg/24hr patch Place 1 patch onto the skin daily.   Yes Historical Provider, MD  oxyCODONE-acetaminophen (PERCOCET/ROXICET) 5-325 MG per tablet Take 1 tablet by mouth every 4 (four) hours as  needed for pain. 12/04/12  Yes Conni Slipper, PA-C  pentoxifylline (TRENTAL) 400 MG CR tablet Take 1 tablet (400 mg total) by mouth 2 (two) times daily with a meal. 10/29/12  Yes [name], MD  prochlorperazine (COMPAZINE) 10 MG tablet Take 10 mg by mouth every 6 (six) hours as needed (nausea).    Yes Historical Provider, MD  promethazine (PHENERGAN) 25 MG tablet Take 25 mg by mouth every 6 (six) hours as needed for nausea.   Yes Historical Provider, MD  sucralfate (CARAFATE) 1 G tablet Take 1 g by mouth daily.   Yes Historical Provider, MD  vitamin E (VITAMIN E) 400 UNIT capsule Take 1 capsule (400 Units total) by mouth 2 (two) times daily. 10/29/12   Yes [name], MD    Allergies:   Allergies  Allergen Reactions  . Carboplatin Shortness Of Breath  . Levaquin [Levofloxacin In D5w] Rash    itching    Social History:  reports that she has been smoking Cigarettes.  She has a 21.5 pack-year smoking history. She has never used smokeless tobacco. She reports that she does not drink alcohol or use illicit drugs.  Family History: Family History  Problem Relation Age of Onset  . Heart disease Father   . Kidney disease Mother   . Alzheimer's disease Sister   . Alzheimer's disease Brother     Physical Exam: Filed Vitals:   01/08/13 1707 01/08/13 1710 01/08/13 1849 01/08/13 1953  BP: 116/72   117/75  Pulse: 102   104  Temp: 98.8 F (37.1 C)  98.1 F (36.7 C) 99.1 F (37.3 C)  TempSrc: Oral   Oral  Resp: 16   18  SpO2: 100% 100%  96%   General appearance: alert, cooperative and no distress Head: Normocephalic, without obvious abnormality, atraumatic Eyes: negative Nose: Nares normal. Septum midline. Mucosa normal. No drainage or sinus tenderness. Neck: no JVD and supple, symmetrical, trachea midline Lungs: clear to auscultation bilaterally Heart: regular rate and rhythm, S1, S2 normal, no murmur, click, rub or gallop Abdomen: soft, non-tender; bowel sounds normal; no masses,  no organomegaly Extremities: extremities normal, atraumatic, no cyanosis or edema Pulses: 2+ and symmetric Skin: Skin color, texture, turgor normal. No rashes or lesions Neurologic: Grossly normal    Labs on Admission:   Recent Labs  01/08/13 0946 01/08/13 0946 01/08/13 1820 01/08/13 1824  NA 142  --  136 139  K 3.9  --  3.3* 3.4*  CL  --   --  101 101  CO2 25  --  27  --   GLUCOSE 100  --  102* 95  BUN 11.9  --  10 10  CREATININE 0.7  --  0.58 0.80  CALCIUM 10.2  --  9.4  --   MG 1.9  --   --   --   PHOS  --  3.4  --   --     Recent Labs  01/08/13 0946  AST 14  ALT 10  ALKPHOS 134  BILITOT 0.31  PROT 6.9  ALBUMIN  3.1*    Recent Labs  01/08/13 0946 01/08/13 1820 01/08/13 1824  WBC 8.3 7.9  --   NEUTROABS 6.4 6.3  --   HGB 11.5* 11.0* 11.9*  HCT 35.2 34.4* 35.0*  MCV 90.6 91.2  --   PLT 341 296  --     Recent Labs  01/08/13 1820  TROPONINI <0.30    Recent Labs  01/08/13 0946  TSH 4.672*  T3FREE 2.9   Radiological Exams on Admission: Dg Chest 2 View  01/08/2013   CLINICAL DATA:  Chest pain. Patient where the metastatic lung cancer.  EXAM: CHEST  2 VIEW  COMPARISON:  The chest CT 12/30/2012 and chest radiograph 11/20/2012  FINDINGS: The heart size is within normal limits and stable. A large opacity at the right lung apex with associated volume loss appears similar to the scout view of the chest CT performed 12/30/2012. The right hilum is elevated, stable. There are emphysematous changes bilaterally. No visible pleural effusion. The bones are osteopenic.  IMPRESSION: Large opacity at the right lung apex is unchanged compared to recent CT performed 12/30/2012. When correlated with recent CT, this likely reflects a combination of the patient's known right apical malignancy, and postobstructive pneumonitis/infection.  Emphysema.   Electronically Signed   By: Britta Mccreedy M.D.   On: 01/08/2013 18:48   Ct Head Wo Contrast  01/08/2013   CLINICAL DATA:  Left-sided arm and leg weakness. Seizure. History of brain cancer with external beam radiation therapy. Confusion. New onset seizures.  EXAM: CT HEAD WITHOUT CONTRAST  TECHNIQUE: Contiguous axial images were obtained from the base of the skull through the vertex without intravenous contrast.  COMPARISON:  MRI 01/07/2013.  FINDINGS: There is no hemorrhage. Vasogenic edema in the right parietal and posterior right frontal lobe appears similar to the prior MRI allowing for differences in technique. There is no midline shift. No hydrocephalus. Vasogenic edema is also present in the inferior right frontal lobe associated with metastatic disease. Calvarium  appears within normal limits.  IMPRESSION: No intracranial hemorrhage or interval change compared to MRI yesterday. Findings compatible with cerebral metastatic disease with associated vasogenic edema the.   Electronically Signed   By: Andreas Newport M.D.   On: 01/08/2013 18:29   Mr [name] ZO Contrast  01/07/2013   CLINICAL DATA:  Metastatic lung cancer. Stereotactic radiosurgery, restaging.  EXAM: MRI HEAD WITHOUT AND WITH CONTRAST  TECHNIQUE: Multiplanar, multiecho pulse sequences of the brain and surrounding structures were obtained according to standard protocol without and with intravenous contrast  CONTRAST:  9mL MULTIHANCE GADOBENATE DIMEGLUMINE 529 MG/ML IV SOLN  COMPARISON:  MR head 10/28/2012 most recent. CT head 11/20/2012 also recent.  FINDINGS: Interval growth of 3 treated intracranial lesions is accompanied by a increasing edema and central restricted diffusion of the 2 largest lesions:  Right inferior frontal lobe now 6 x 9 mm as compared with 5 x 7 previously, with moderate surrounding edema.  Right parietal periatrial lesion now 16 x 17 mm as compared with 12 x 13 mm previously. Increased echogenic edema.  Right anterior parietal convexity near parasagittal metastasis now 20 x 23 mm as compared with 15 x 14 mm, also with significant vasogenic edema worsening.  No enhancing left hemisphere lesions observed.  No new metastatic deposits are observed. Unchanged atrophy and small vessel disease. No osseous lesions. 1-2 mm midline shift.  IMPRESSION: Interval worsening particularly of the two right parietal lesions. Correlate clinically for post treatment change versus progression of metastatic disease.   Electronically Signed   By: Davonna Belling M.D.   On: 01/07/2013 16:14   Assessment/Plan 62 yo female with lung cancer with mets to brain with breakthrough seizures on keppra and decadron  Principal Problem:   Seizure-  Could be related to her recent procedure to frontal lesion.  Cerebral edema  not significantly worse.  No fevers.  Oncology recommended decadron 8mg  iv q8 hour.  Neurology also called for consultation and will also see.  Await further recs concerning keppra dosing from neurology team.    Active Problems:   Brain Mets - 17 mm Rt Frontal s/p SRS, then Lt Frontal 5 mm and Rt Parietal 18 mm mets s/p SRS, now with new 5 mm Rt Parietal met   COPD (chronic obstructive pulmonary disease)   Pericardial effusion- s/p subxiphoid pericardial window 08/28/12   Left-sided weakness   Lung cancer  Pt wishes to be DNR, does not want intubation or cpr in the future, discussed in front of her son and dtr in law.    Aceton Kinnear A 01/08/2013, 9:47 PM

## 2013-01-08 NOTE — ED Notes (Signed)
Pt and family is aware of the need of a urine sample.

## 2013-01-09 DIAGNOSIS — E440 Moderate protein-calorie malnutrition: Secondary | ICD-10-CM | POA: Diagnosis present

## 2013-01-09 DIAGNOSIS — G936 Cerebral edema: Secondary | ICD-10-CM

## 2013-01-09 DIAGNOSIS — M6281 Muscle weakness (generalized): Secondary | ICD-10-CM

## 2013-01-09 DIAGNOSIS — C7931 Secondary malignant neoplasm of brain: Principal | ICD-10-CM

## 2013-01-09 LAB — CBC
HCT: 36.3 % (ref 36.0–46.0)
Hemoglobin: 11.8 g/dL — ABNORMAL LOW (ref 12.0–15.0)
MCH: 29.6 pg (ref 26.0–34.0)
MCHC: 32.5 g/dL (ref 30.0–36.0)
MCV: 91 fL (ref 78.0–100.0)
RBC: 3.99 MIL/uL (ref 3.87–5.11)
RDW: 19.2 % — ABNORMAL HIGH (ref 11.5–15.5)

## 2013-01-09 LAB — BASIC METABOLIC PANEL
BUN: 11 mg/dL (ref 6–23)
CO2: 25 mEq/L (ref 19–32)
Chloride: 101 mEq/L (ref 96–112)
GFR calc Af Amer: >90 mL/min (ref >90)
Glucose, Bld: 106 mg/dL — ABNORMAL HIGH (ref 70–99)
Potassium: 3.8 mEq/L (ref 3.5–5.1)

## 2013-01-09 LAB — T3, FREE: T3, Free: 2.9 pg/mL (ref 2.3–4.2)

## 2013-01-09 LAB — T4, FREE: Free T4: 1.13 ng/dL (ref 0.80–1.80)

## 2013-01-09 LAB — LEVETIRACETAM LEVEL: Levetiracetam Lvl: 8.6 ug/mL (ref 5.0–30.0)

## 2013-01-09 LAB — HEPATITIS C ANTIBODY: HCV Ab: NEGATIVE

## 2013-01-09 MED ORDER — LEVETIRACETAM 500 MG PO TABS
1000.0000 mg | ORAL_TABLET | Freq: Two times a day (BID) | ORAL | Status: DC
  Administered 2013-01-09 – 2013-01-10 (×3): 1000 mg via ORAL
  Filled 2013-01-09 (×4): qty 2

## 2013-01-09 MED ORDER — BOOST PLUS PO LIQD
237.0000 mL | ORAL | Status: DC
  Filled 2013-01-09 (×2): qty 237

## 2013-01-09 MED ORDER — ADULT MULTIVITAMIN W/MINERALS CH
1.0000 | ORAL_TABLET | Freq: Every day | ORAL | Status: DC
  Administered 2013-01-09 – 2013-01-10 (×2): 1 via ORAL
  Filled 2013-01-09 (×2): qty 1

## 2013-01-09 MED ORDER — PRO-STAT SUGAR FREE PO LIQD
30.0000 mL | ORAL | Status: DC
  Administered 2013-01-09: 17:00:00 30 mL via ORAL
  Filled 2013-01-09 (×2): qty 30

## 2013-01-09 NOTE — Progress Notes (Addendum)
Given COPD GOLD card # [phone]

## 2013-01-09 NOTE — Progress Notes (Signed)
[facility] PROGRESS NOTE  Melanie Liu ZOX:[id number] DOB: 1950-04-01 DOA: 01/08/2013 PCP: [name], MD  Brief narrative: 62 year-old female with past medical history of 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 Kathrynn Running, concurrent chemoradiation with chemotherapy, status post total of 6 cycles with evidence for disease progression. Patient presented to [facility] ED 01/08/2013 with onset of seizures and left sided weakness and numbness. Based on MRI brain 01/07/2013 there is an evidence of interval worsening of the two right parietal lesions worrisome for progression of metastatic disease. In ED, vitals were stable with blood pressure 114/71, heart rate 102 and Tmax 99.1 F. CBC revealed hemoglobin of 11. BMP revealed potassium of 3.3. X-ray revealed large opacity at the right lung apex unchanged compared to recent CAT scan on 12/30/2012. CT head revealed no intracranial hemorrhage or interval change compared to MRI done 01/07/2013. Findings are compatible with cerebral metastatic disease with vasogenic edema.  Assessment and Plan:  Principal Problem:   Seizure in the setting of metastatic lung cancer, metastasis to brain - Interval worsening of particularly right to provide the lesions worrisome for progression of disease - Appreciate neurology consult and recommendations - Patient to receive Keppra 1,000 mg  IV one dose and then put on by mouth twice a day regimen - Continue Decadron 8 mg by mouth every 8 hours Active Problems:   Non-small cell lung adenocarcinoma with brain metastasis - management per oncology - will notify [name] of patient's admission   COPD (chronic obstructive pulmonary disease) - stable, continue albuterol and Atrovent nebulizers every 4 hours as needed   Left-sided weakness - likely related to  brain metastases - appreciate neurology following - continue keppra and decadron   Anemia of chronic disease - Secondary to history of malignancy and sequela of chemotherapy - Hemoglobin 11 on admission - No indications for transfusion   Hypokalemia - Initial potassium 3.3 but subsequent potassium level within normal limits   Moderate protein calorie malnutrition - Secondary to history of malignancy - Nutrition consulted  Consultants:  Oncology ([name])  Procedures/Studies: Dg Chest 2 View 01/08/2013    IMPRESSION: Large opacity at the right lung apex is unchanged compared to recent CT performed 12/30/2012. When correlated with recent CT, this likely reflects a combination of the patient's known right apical malignancy, and postobstructive pneumonitis/infection.  Emphysema.     Ct Head Wo Contrast 01/08/2013    IMPRESSION: No intracranial hemorrhage or interval change compared to MRI yesterday. Findings compatible with cerebral metastatic disease with associated vasogenic edema.  Mr [name] Wo Contrast 01/07/2013     IMPRESSION: Interval worsening particularly of the two right parietal lesions. Correlate clinically for post treatment change versus progression of metastatic disease.      Antibiotics:  none  Code Status: DNR/DNI Family Communication: Pt at bedside Disposition Plan: Home when medically stable  HPI/Subjective: No events overnight.   Objective: Filed Vitals:   01/08/13 1953 01/08/13 2310 01/09/13 0544 01/09/13 0839  BP: 117/75 114/71 114/76 118/88  Pulse: 104 105 114 108  Temp: 99.1 F (37.3 C) 96.9 F (36.1 C) 98.3 F (36.8 C)   TempSrc: Oral Axillary Oral   Resp: 18 16 18 23   Height:  5\' 1"  (1.549 m)    Weight:  46.6 kg (102 lb 11.8 oz)    SpO2: 96% 95% 91% 93%    Intake/Output Summary (  Last 24 hours) at 01/09/13 0852 Last data filed at 01/09/13 0640  Gross per 24 hour  Intake    110 ml  Output    275 ml  Net   -165 ml     Exam:   General:  Pt is alert, follows commands appropriately, not in acute distress  Cardiovascular: Regular rate and rhythm, S1/S2, no murmurs, no rubs, no gallops  Respiratory: Clear to auscultation bilaterally, no wheezing, no crackles, no rhonchi  Abdomen: Soft, non tender, non distended, bowel sounds present, no guarding  Extremities: pulses DP and PT palpable bilaterally  Neuro: left side weakness with decreased sensation; otherwise no other neuro deficits  Data Reviewed: Basic Metabolic Panel:  Recent Labs Lab 01/08/13 0946 01/08/13 0946 01/08/13 1820 01/08/13 1824 01/09/13 0452  NA 142  --  136 139 135  K 3.9  --  3.3* 3.4* 3.8  CL  --   --  101 101 101  CO2 25  --  27  --  25  GLUCOSE 100  --  102* 95 106*  BUN 11.9  --  10 10 11   CREATININE 0.7  --  0.58 0.80 0.58  CALCIUM 10.2  --  9.4  --  9.8  MG 1.9  --   --   --   --   PHOS  --  3.4  --   --   --    Liver Function Tests:  Recent Labs Lab 01/08/13 0946  AST 14  ALT 10  ALKPHOS 134  BILITOT 0.31  PROT 6.9  ALBUMIN 3.1*   No results found for this basename: LIPASE, AMYLASE,  in the last 168 hours No results found for this basename: AMMONIA,  in the last 168 hours CBC:  Recent Labs Lab 01/08/13 0946 01/08/13 1820 01/08/13 1824 01/09/13 0452  WBC 8.3 7.9  --  10.3  NEUTROABS 6.4 6.3  --   --   HGB 11.5* 11.0* 11.9* 11.8*  HCT 35.2 34.4* 35.0* 36.3  MCV 90.6 91.2  --  91.0  PLT 341 296  --  342   Cardiac Enzymes:  Recent Labs Lab 01/08/13 1820  TROPONINI <0.30   BNP: No components found with this basename: POCBNP,  CBG:  Recent Labs Lab 01/08/13 1812  GLUCAP 89    No results found for this or any previous visit (from the past 240 hour(s)).   Scheduled Meds: . dexamethasone  8 mg Oral Q8H  . levETIRAcetam  1,000 mg Oral BID  . pentoxifylline  400 mg Oral BID WC    Debbora Presto, MD  [facility] Pager [phone]  If 7PM-7AM, please contact  night-coverage [email or web address] Password [email or web address] 01/09/2013, 8:52 AM   LOS: 1 day

## 2013-01-09 NOTE — Care Management Note (Signed)
    Page 1 of 1   01/09/2013     10:53:51 AM   CARE MANAGEMENT NOTE 01/09/2013  Patient:  Melanie Liu, Melanie Liu   Account Number:  [id number]  Date Initiated:  01/09/2013  Documentation initiated by:  Lanier Clam  Subjective/Objective Assessment:   62 Y/O F ADMITTED W/SEIZURE.     Action/Plan:   FROM HOME.HAS PCP,PHARMACY.HAS APPLIED FOR MEDICAID.   Anticipated DC Date:  01/12/2013   Anticipated DC Plan:  HOME/SELF CARE  In-house referral  Financial Counselor      DC Planning Services  CM consult      Choice offered to / List presented to:             Status of service:  In process, will continue to follow Medicare Important Message given?   (If response is "NO", the following Medicare IM given date fields will be blank) Date Medicare IM given:   Date Additional Medicare IM given:    Discharge Disposition:    Per UR Regulation:  Reviewed for med. necessity/level of care/duration of stay  If discussed at Long Length of Stay Meetings, dates discussed:    Comments:  01/09/13 [facility] Lunabella Badgett RN,BSN [phone]

## 2013-01-09 NOTE — Progress Notes (Signed)
INITIAL NUTRITION ASSESSMENT  DOCUMENTATION CODES Per approved criteria  -Not Applicable   INTERVENTION: Encourage PO intake Provide Multivitamin with minerals daily Provide Boost Plus and Pro-stat once daily each RD to continue to monitor and provide further intervention as needed  NUTRITION DIAGNOSIS: Malnutrition related to history of molignancy as evidenced by pt's chart.   Goal: Pt to meet >/= 90% of their estimated nutrition needs   Monitor:  PO intake Weight Labs  Reason for Assessment: Consult (poor PO intake)  62 y.o. 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 Based on MRI brain 01/07/2013 there is an evidence of interval worsening of the two right parietal lesions worrisome for progression of metastatic disease.  Pt asleep at time of visit. Discussed pt with pt's family at bedside who state pt has been eating really well and maintaining her weight. They reports pt's usual body weight is 104 lbs and she has lost 1-2 lbs despite eating well. They deny any use of or need for nutritional supplements. Per MD note, pt has moderate protein calorie malnutrition secondary to history of malignancy. Weight history shows pt's weight has been stable over the past 3 months.   Height: Ht Readings from Last 1 Encounters:  01/08/13 5\' 1"  (1.549 m)    Weight: Wt Readings from Last 1 Encounters:  01/08/13 102 lb 11.8 oz (46.6 kg)    Ideal Body Weight: 105 lbs  % Ideal Body Weight: 97%  Wt Readings from Last 10 Encounters:  01/08/13 102 lb 11.8 oz (46.6 kg)  01/01/13 104 lb 14.4 oz (47.582 kg)  12/04/12 98 lb 3.2 oz (44.543 kg)  11/20/12 101 lb 11.2 oz (46.131 kg)  10/22/12 102 lb 6.4 oz (46.448 kg)   10/06/12 99 lb 4.8 oz (45.042 kg)  10/01/12 101 lb (45.813 kg)  09/30/12 103 lb 14.4 oz (47.129 kg)  09/26/12 102 lb 11.8 oz (46.6 kg)  09/26/12 102 lb 11.8 oz (46.6 kg)    Usual Body Weight: 104 lbs  % Usual Body Weight: 98%  BMI:  Body mass index is 19.42 kg/(m^2).  Estimated Nutritional Needs: Kcal: 1500-1700 Protein: 65-75 grams Fluid: 1.4-1.6 L/day  Skin: intact  Diet Order: General  EDUCATION NEEDS: -No education needs identified at this time   Intake/Output Summary (Last 24 hours) at 01/09/13 1410 Last data filed at 01/09/13 0640  Gross per 24 hour  Intake    110 ml  Output    275 ml  Net   -165 ml    Last BM: 11/5  Labs:   Recent Labs Lab 01/08/13 0946 01/08/13 0946 01/08/13 1820 01/08/13 1824 01/09/13 0452  NA 142  --  136 139 135  K 3.9  --  3.3* 3.4* 3.8  CL  --   --  101 101 101  CO2 25  --  27  --  25  BUN 11.9  --  10 10 11   CREATININE 0.7  --  0.58 0.80 0.58  CALCIUM 10.2  --  9.4  --  9.8  MG 1.9  --   --   --   --   PHOS  --  3.4  --   --   --   GLUCOSE 100  --  102* 95 106*    CBG (last 3)   Recent Labs  01/08/13 1812  GLUCAP 89    Scheduled Meds: . dexamethasone  8 mg Oral Q8H  . levETIRAcetam  1,000 mg Oral BID  . pentoxifylline  400 mg Oral BID WC  . sodium chloride  3 mL Intravenous Q12H  . sodium chloride  3 mL Intravenous Q12H  . sucralfate  1 g Oral Daily  . vitamin E  400 Units Oral BID    Continuous Infusions:   Past Medical History  Diagnosis Date  . Nodule of right lung     right upper lobe  . Anemia   . Rectovaginal fistula   . Right frontal lobe lesion 12/13/11    CT head  . History of radiation therapy 12/24/11-02/08/12    rul 66Gy/24fxs  . Dysphagia   . History of radiation therapy 12/27/11    SRS rt ant frontal 20gy  . Lung cancer 11/23/11    biopsy-right  . Brain metastases   . Chest pain at rest 08/26/2012  . COPD (chronic obstructive pulmonary disease)   . Shortness of breath      Past Surgical History  Procedure Laterality Date  . Partial hysterectomy    . Hemmorhoid surgery    . Neck surgery    . Retinal detachment surgery    . Esophagogastroduodenoscopy N/A 05/01/2012    Procedure: ESOPHAGOGASTRODUODENOSCOPY (EGD);  Surgeon: Shirley Friar, MD;  Location: [facility];  Service: Endoscopy;  Laterality: N/A;  . Subxyphoid pericardial window N/A 08/28/2012    Procedure: SUBXYPHOID PERICARDIAL WINDOW;  Surgeon: Kerin Perna, MD;  Location: [facility] OR;  Service: Thoracic;  Laterality: N/A;  . Tee without cardioversion  08/28/2012    Procedure: TRANSESOPHAGEAL ECHOCARDIOGRAM (TEE);  Surgeon: Kerin Perna, MD;  Location: [facility] OR;  Service: Thoracic;;  . Eye surgery    . Tubal ligation    . Mass excision Right 09/26/2012    Procedure: EXCISION MASS;  Surgeon: Kerin Perna, MD;  Location: [facility] OR;  Service: Vascular;  Laterality: Right;  EXCISION OF RIGHT NECK  MASS  . Abdominal hysterectomy  1988    [facility] - Dr. Christie Beckers    Ian Malkin RD, LDN Inpatient Clinical Dietitian Pager: [phone] After Hours Pager: [phone]

## 2013-01-09 NOTE — Consult Note (Signed)
Reason for Consult:Seizures Referring Physician: Onalee Hua  CC: Seizures  HPI: Melanie Liu is an 62 y.o. female with a history of metastatic lung cancer to the brain s/p stereotactic radiosurgery who today was noted to have 2 seizures.  The patient remembers some shaking but nothing else.  The patient also reports that on 11/5 she noted weakness in her left arm and leg.  There is some numbness in her left arm as well.  The patient did have a MRI of the brain on 11/5 that has been reviewed and shows interval worsening of her right parietal metastatic lesions.  Patient also reports that she forgot to take her Keppra today.  She has had seizures before with her last being in September.    Past Medical History  Diagnosis Date  . Nodule of right lung     right upper lobe  . Anemia   . Rectovaginal fistula   . Right frontal lobe lesion 12/13/11    CT head  . History of radiation therapy 12/24/11-02/08/12    rul 66Gy/97fxs  . Dysphagia   . History of radiation therapy 12/27/11    SRS rt ant frontal 20gy  . Lung cancer 11/23/11    biopsy-right  . Brain metastases   . Chest pain at rest 08/26/2012  . COPD (chronic obstructive pulmonary disease)   . Shortness of breath     Past Surgical History  Procedure Laterality Date  . Partial hysterectomy    . Hemmorhoid surgery    . Neck surgery    . Retinal detachment surgery    . Esophagogastroduodenoscopy N/A 05/01/2012    Procedure: ESOPHAGOGASTRODUODENOSCOPY (EGD);  Surgeon: Shirley Friar, MD;  Location: [facility];  Service: Endoscopy;  Laterality: N/A;  . Subxyphoid pericardial window N/A 08/28/2012    Procedure: SUBXYPHOID PERICARDIAL WINDOW;  Surgeon: Kerin Perna, MD;  Location: [facility] OR;  Service: Thoracic;  Laterality: N/A;  . Tee without cardioversion  08/28/2012    Procedure: TRANSESOPHAGEAL ECHOCARDIOGRAM (TEE);  Surgeon: Kerin Perna, MD;  Location: [facility] OR;  Service: Thoracic;;  . Eye surgery    . Tubal ligation    . Mass  excision Right 09/26/2012    Procedure: EXCISION MASS;  Surgeon: Kerin Perna, MD;  Location: [facility] OR;  Service: Vascular;  Laterality: Right;  EXCISION OF RIGHT NECK  MASS  . Abdominal hysterectomy  1988    [facility] - Dr. Christie Beckers    Family History  Problem Relation Age of Onset  . Heart disease Father   . Kidney disease Mother   . Alzheimer's disease Sister   . Alzheimer's disease Brother     Social History:  reports that she has been smoking Cigarettes.  She has a 21.5 pack-year smoking history. She has never used smokeless tobacco. She reports that she does not drink alcohol or use illicit drugs.  Allergies  Allergen Reactions  . Carboplatin Shortness Of Breath  . Levaquin [Levofloxacin In D5w] Rash    itching    Medications:  I have reviewed the patient's current medications. Prior to Admission:  Prescriptions prior to admission  Medication Sig Dispense Refill  . albuterol (PROVENTIL) (5 MG/ML) 0.5% nebulizer solution Take 2.5 mg by nebulization every 6 (six) hours as needed for wheezing.      [name] dexamethasone (DECADRON) 4 MG tablet Take 4 mg by mouth daily. Patient takes 1 mg (1/4 of a tablet) daily; dose resumed following 11/21/2012 hospital discharge dose of 1mg  BID x 5 days.      [name]  ipratropium (ATROVENT) 0.02 % nebulizer solution Take 500 mcg by nebulization every 4 (four) hours as needed for wheezing.      . levETIRAcetam (KEPPRA) 750 MG tablet Take 1 tablet (750 mg total) by mouth 2 (two) times daily.  60 tablet  0  . nicotine (NICODERM CQ - DOSED IN MG/24 HOURS) 21 mg/24hr patch Place 1 patch onto the skin daily.      [name] oxyCODONE-acetaminophen (PERCOCET/ROXICET) 5-325 MG per tablet Take 1 tablet by mouth every 4 (four) hours as needed for pain.  60 tablet  0  . pentoxifylline (TRENTAL) 400 MG CR tablet Take 1 tablet (400 mg total) by mouth 2 (two) times daily with a meal.  60 tablet  5  . prochlorperazine (COMPAZINE) 10 MG tablet Take 10 mg by mouth every 6 (six) hours  as needed (nausea).       . promethazine (PHENERGAN) 25 MG tablet Take 25 mg by mouth every 6 (six) hours as needed for nausea.      . sucralfate (CARAFATE) 1 G tablet Take 1 g by mouth daily.      . vitamin E (VITAMIN E) 400 UNIT capsule Take 1 capsule (400 Units total) by mouth 2 (two) times daily.  60 capsule  5   Scheduled: . dexamethasone  8 mg Oral Q8H  . levETIRAcetam  1,000 mg Oral BID  . pentoxifylline  400 mg Oral BID WC  . sodium chloride  3 mL Intravenous Q12H  . sodium chloride  3 mL Intravenous Q12H  . sucralfate  1 g Oral Daily  . vitamin E  400 Units Oral BID    ROS: History obtained from the patient  General ROS: negative for - chills, fatigue, fever, night sweats, weight gain or weight loss Psychological ROS: negative for - behavioral disorder, hallucinations, memory difficulties, mood swings or suicidal ideation Ophthalmic ROS: negative for - blurry vision, double vision, eye pain or loss of vision ENT ROS: negative for - epistaxis, nasal discharge, oral lesions, sore throat, tinnitus or vertigo Allergy and Immunology ROS: negative for - hives or itchy/watery eyes Hematological and Lymphatic ROS: negative for - bleeding problems, bruising or swollen lymph nodes Endocrine ROS: negative for - galactorrhea, hair pattern changes, polydipsia/polyuria or temperature intolerance Respiratory ROS: negative for - cough, hemoptysis, shortness of breath or wheezing Cardiovascular ROS: negative for - chest pain, dyspnea on exertion, edema or irregular heartbeat Gastrointestinal ROS: negative for - abdominal pain, diarrhea, hematemesis, nausea/vomiting or stool incontinence Genito-Urinary ROS: negative for - dysuria, hematuria, incontinence or urinary frequency/urgency Musculoskeletal ROS: negative for - joint swelling or muscular weakness Neurological ROS: as noted in HPI Dermatological ROS: negative for rash and skin lesion changes  Physical Examination: Blood pressure  114/71, pulse 105, temperature 96.9 F (36.1 C), temperature source Axillary, resp. rate 16, height 5\' 1"  (1.549 m), weight 46.6 kg (102 lb 11.8 oz), SpO2 95.00%.  Neurologic Examination Mental Status: Lethargic but oriented.  Thought content appropriate.  Speech fluent without evidence of aphasia.  Able to follow 3 step commands without difficulty. Cranial Nerves: II: Discs flat bilaterally; Visual fields grossly normal, pupils equal, round, reactive to light and accommodation III,IV, VI: ptosis not present, extra-ocular motions intact bilaterally V,VII: smile symmetric, facial light touch sensation normal bilaterally VIII: hearing normal bilaterally IX,X: gag reflex present XI: bilateral shoulder shrug XII: midline tongue extension Motor: Right : Upper extremity   5/5    Left:     Upper extremity   4-/5  Lower extremity  5/5     Lower extremity   4-/5 Tone and bulk:normal tone throughout; no atrophy noted Sensory: Pinprick and light touch decreased in the left upper extremity Deep Tendon Reflexes: 2+ in the upper extremities, 1+ in the right KJ and 2+ in the left KJ.  Plantars absent bilaterally.  Plantars: Right: equivocal   Left: downgoing Cerebellar: normal finger-to-nose and normal heel-to-shin test as limited by strength Gait: Unable to test CV: pulses palpable throughout     Laboratory Studies:   Basic Metabolic Panel:  Recent Labs Lab 01/08/13 0946 01/08/13 0946 01/08/13 1820 01/08/13 1824  NA 142  --  136 139  K 3.9  --  3.3* 3.4*  CL  --   --  101 101  CO2 25  --  27  --   GLUCOSE 100  --  102* 95  BUN 11.9  --  10 10  CREATININE 0.7  --  0.58 0.80  CALCIUM 10.2  --  9.4  --   MG 1.9  --   --   --   PHOS  --  3.4  --   --     Liver Function Tests:  Recent Labs Lab 01/08/13 0946  AST 14  ALT 10  ALKPHOS 134  BILITOT 0.31  PROT 6.9  ALBUMIN 3.1*   No results found for this basename: LIPASE, AMYLASE,  in the last 168 hours No results found for  this basename: AMMONIA,  in the last 168 hours  CBC:  Recent Labs Lab 01/08/13 0946 01/08/13 1820 01/08/13 1824  WBC 8.3 7.9  --   NEUTROABS 6.4 6.3  --   HGB 11.5* 11.0* 11.9*  HCT 35.2 34.4* 35.0*  MCV 90.6 91.2  --   PLT 341 296  --     Cardiac Enzymes:  Recent Labs Lab 01/08/13 1820  TROPONINI <0.30    BNP: No components found with this basename: POCBNP,   CBG:  Recent Labs Lab 01/08/13 1812  GLUCAP 89    Microbiology: Results for orders placed in visit on 12/30/12  TECHNOLOGIST REVIEW     Status: None   Collection Time    12/30/12  8:23 AM      Result Value Range Status   Technologist Review 2% myelocytes   Final    Coagulation Studies:  Recent Labs  01/08/13 1820  LABPROT 12.5  INR 0.95    Urinalysis:  Recent Labs Lab 01/08/13 2002  COLORURINE YELLOW  LABSPEC 1.026  PHURINE 6.5  GLUCOSEU NEGATIVE  HGBUR NEGATIVE  BILIRUBINUR NEGATIVE  KETONESUR NEGATIVE  PROTEINUR NEGATIVE  UROBILINOGEN 0.2  NITRITE NEGATIVE  LEUKOCYTESUR NEGATIVE    Lipid Panel:     Component Value Date/Time   CHOL 192 11/21/2012 0705   TRIG 189* 11/21/2012 0705   HDL 41 11/21/2012 0705   CHOLHDL 4.7 11/21/2012 0705   VLDL 38 11/21/2012 0705   LDLCALC 113* 11/21/2012 0705    HgbA1C:  Lab Results  Component Value Date   HGBA1C 5.2 11/20/2012    Urine Drug Screen:     Component Value Date/Time   LABOPIA NONE DETECTED 01/08/2013 2001   COCAINSCRNUR NONE DETECTED 01/08/2013 2001   LABBENZ NONE DETECTED 01/08/2013 2001   AMPHETMU NONE DETECTED 01/08/2013 2001   THCU POSITIVE* 01/08/2013 2001   LABBARB NONE DETECTED 01/08/2013 2001    Alcohol Level:  Recent Labs Lab 01/08/13 1820  ETH <11    Other results: EKG: sinus tachycardia at 100 bpm.  Imaging: Dg Chest  2 View  01/08/2013   CLINICAL DATA:  Chest pain. Patient where the metastatic lung cancer.  EXAM: CHEST  2 VIEW  COMPARISON:  The chest CT 12/30/2012 and chest radiograph 11/20/2012  FINDINGS: The  heart size is within normal limits and stable. A large opacity at the right lung apex with associated volume loss appears similar to the scout view of the chest CT performed 12/30/2012. The right hilum is elevated, stable. There are emphysematous changes bilaterally. No visible pleural effusion. The bones are osteopenic.  IMPRESSION: Large opacity at the right lung apex is unchanged compared to recent CT performed 12/30/2012. When correlated with recent CT, this likely reflects a combination of the patient's known right apical malignancy, and postobstructive pneumonitis/infection.  Emphysema.   Electronically Signed   By: Britta Mccreedy M.D.   On: 01/08/2013 18:48   Ct Head Wo Contrast  01/08/2013   CLINICAL DATA:  Left-sided arm and leg weakness. Seizure. History of brain cancer with external beam radiation therapy. Confusion. New onset seizures.  EXAM: CT HEAD WITHOUT CONTRAST  TECHNIQUE: Contiguous axial images were obtained from the base of the skull through the vertex without intravenous contrast.  COMPARISON:  MRI 01/07/2013.  FINDINGS: There is no hemorrhage. Vasogenic edema in the right parietal and posterior right frontal lobe appears similar to the prior MRI allowing for differences in technique. There is no midline shift. No hydrocephalus. Vasogenic edema is also present in the inferior right frontal lobe associated with metastatic disease. Calvarium appears within normal limits.  IMPRESSION: No intracranial hemorrhage or interval change compared to MRI yesterday. Findings compatible with cerebral metastatic disease with associated vasogenic edema the.   Electronically Signed   By: Andreas Newport M.D.   On: 01/08/2013 18:29   Mr [name] ZO Contrast  01/07/2013   CLINICAL DATA:  Metastatic lung cancer. Stereotactic radiosurgery, restaging.  EXAM: MRI HEAD WITHOUT AND WITH CONTRAST  TECHNIQUE: Multiplanar, multiecho pulse sequences of the brain and surrounding structures were obtained according to  standard protocol without and with intravenous contrast  CONTRAST:  9mL MULTIHANCE GADOBENATE DIMEGLUMINE 529 MG/ML IV SOLN  COMPARISON:  MR head 10/28/2012 most recent. CT head 11/20/2012 also recent.  FINDINGS: Interval growth of 3 treated intracranial lesions is accompanied by a increasing edema and central restricted diffusion of the 2 largest lesions:  Right inferior frontal lobe now 6 x 9 mm as compared with 5 x 7 previously, with moderate surrounding edema.  Right parietal periatrial lesion now 16 x 17 mm as compared with 12 x 13 mm previously. Increased echogenic edema.  Right anterior parietal convexity near parasagittal metastasis now 20 x 23 mm as compared with 15 x 14 mm, also with significant vasogenic edema worsening.  No enhancing left hemisphere lesions observed.  No new metastatic deposits are observed. Unchanged atrophy and small vessel disease. No osseous lesions. 1-2 mm midline shift.  IMPRESSION: Interval worsening particularly of the two right parietal lesions. Correlate clinically for post treatment change versus progression of metastatic disease.   Electronically Signed   By: Davonna Belling M.D.   On: 01/07/2013 16:14     Assessment/Plan: 62 year old female with metastatic lung cancer presenting with breakthrough seizures.  CT of the head was performed and reviewed.  CT is consistent with the findings of progression seen on MRI of 11/5.  Patient also missed her medication.  It is likely that hteh combination of these two factors led to her breakthrough seizures.  Recommendations: 1.  Seizure  precautions 2.  Keppra 1000mg  IV now 3.  Would increase maintenance to 1000mg  Q12  4.  Oncology to see patient] 5.  Agree with continuation of Decadron  Thana Farr, MD [facility] [phone] 01/09/2013, 1:37 AM

## 2013-01-09 NOTE — Progress Notes (Signed)
Met with Melanie Liu at bedside to explain and offer [facility] Care Management services on behalf of the COPD [facility] program. Consents signed and explained that she will receive post hospital discharge call and will be evaluated for monthly home visits. Confirmed best contact number for her.  Raiford Noble, MSN-Ed, RN,BSN-- [facility] [phone]

## 2013-01-10 DIAGNOSIS — J189 Pneumonia, unspecified organism: Secondary | ICD-10-CM

## 2013-01-10 MED ORDER — DEXAMETHASONE 4 MG PO TABS: 4.0000 mg | ORAL_TABLET | Freq: Three times a day (TID) | ORAL | Status: DC

## 2013-01-10 MED ORDER — LEVETIRACETAM 1000 MG PO TABS: 1000.0000 mg | ORAL_TABLET | Freq: Two times a day (BID) | ORAL | Status: DC

## 2013-01-10 NOTE — Discharge Summary (Signed)
Physician Discharge Summary  Melanie Liu ZOX:[id number] DOB: 07-13-1950 DOA: 01/08/2013  PCP: [name], MD  Admit date: 01/08/2013 Discharge date: 01/10/2013  Recommendations for Outpatient Follow-up:  1. Pt will need to follow up with PCP in 2-3 weeks post discharge 2. Please obtain BMP to evaluate electrolytes and kidney function 3. 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, then Lt Frontal 5 mm and Rt Parietal 18 mm mets s/p SRS, now with new 5 mm Rt Parietal met   COPD (chronic obstructive pulmonary disease)   Pericardial effusion- s/p subxiphoid pericardial window 08/28/12   Left-sided weakness   Lung cancer  Discharge Condition: Stable  Diet recommendation: Heart healthy diet discussed in details   Brief narrative:  62 year-old female with past medical history of 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 Kathrynn Running, concurrent chemoradiation with chemotherapy, status post total of 6 cycles with evidence for disease progression. Patient presented to [facility] ED 01/08/2013 with onset of seizures and left sided weakness and numbness. Based on MRI brain 01/07/2013 there is an evidence of interval worsening of the two right parietal lesions worrisome for progression of metastatic disease.   In ED, vitals were stable with blood pressure 114/71, heart rate 102 and Tmax 99.1 F. CBC revealed hemoglobin of 11. BMP revealed potassium of 3.3. X-ray revealed large opacity at the right lung apex unchanged compared to recent CAT scan on 12/30/2012. CT head revealed no intracranial hemorrhage or interval change compared to MRI done 01/07/2013.  Findings are compatible with cerebral metastatic disease with vasogenic edema.   Assessment and Plan:  Principal Problem:  Seizure in the setting of metastatic lung cancer, metastasis to brain  - Interval worsening of particularly right to provide the lesions worrisome for progression of disease  - Appreciate neurology consult and recommendations  - Patient received Keppra 1,000 mg IV one dose and then put on by mouth twice a day regimen  - no seizures while inpatient  - Continue Decadron PO  Active Problems:  Non-small cell lung adenocarcinoma with brain metastasis  - management per oncology  - will notify [name] of patient's admission  COPD (chronic obstructive pulmonary disease)  - stable, continue albuterol and Atrovent nebulizers every 4 hours as needed  Left-sided weakness  - likely related to brain metastases  - appreciate neurology following  - continue keppra and decadron  Anemia of chronic disease  - Secondary to history of malignancy and sequela of chemotherapy  - Hemoglobin 11 on admission  - No indications for transfusion  Hypokalemia  - Initial potassium 3.3 but subsequent potassium level within normal limits  Moderate protein calorie malnutrition  - Secondary to history of malignancy  - Nutrition consulted   Consultants:  Neurology Procedures/Studies:  Dg Chest 2 View 01/08/2013 IMPRESSION: Large opacity at the right lung apex is unchanged compared to recent CT performed 12/30/2012. When correlated with recent CT, this likely reflects a combination of the patient's known right apical malignancy, and postobstructive pneumonitis/infection. Emphysema.   Ct Head Wo Contrast  01/08/2013 IMPRESSION: No intracranial hemorrhage or interval change compared to MRI yesterday. Findings compatible with cerebral metastatic disease with associated vasogenic edema.  Mr [name] Wo Contrast  01/07/2013 IMPRESSION: Interval worsening particularly of the two right parietal  lesions. Correlate clinically for post treatment change versus progression of metastatic disease.  Antibiotics:  None  Code Status: DNR/DNI  Family Communication: Pt at bedside  Disposition Plan: Home discharge today    Discharge Exam: Filed Vitals:   01/10/13 0547  BP: 112/72  Pulse: 105  Temp: 98.3 F (36.8 C)  Resp: 19   Filed Vitals:   01/09/13 2100 01/09/13 2125 01/10/13 0517 01/10/13 0547  BP: 94/65 105/65  112/72  Pulse: 110   105  Temp: 98.3 F (36.8 C)   98.3 F (36.8 C)  TempSrc: Oral   Oral  Resp: 18   19  Height:      Weight:      SpO2: 95%  94% 94%    General: Pt is alert, follows commands appropriately, not in acute distress Cardiovascular: Regular rate and rhythm, S1/S2 +, no murmurs, no rubs, no gallops Respiratory: Clear to auscultation bilaterally, no wheezing, no crackles, no rhonchi Abdominal: Soft, non tender, non distended, bowel sounds +, no guarding Extremities: no edema, no cyanosis, pulses palpable bilaterally DP and PT Neuro: Grossly nonfocal  Discharge Instructions  Discharge Orders   Future Appointments Provider Department Dept Phone   01/13/2013 10:15 AM Dava Najjar [facility] [phone]   01/13/2013 10:45 AM [facility] [phone]   01/13/2013 11:30 AM [facility] [phone]   02/12/2013 8:45 AM [name], MD [facility] [phone]   Future Orders Complete By Expires   Diet - low sodium heart healthy  As directed    Increase activity slowly  As directed        Medication List         albuterol (5 MG/ML) 0.5% nebulizer solution  Commonly known as:  PROVENTIL  Take 2.5 mg by nebulization every 6 (six) hours as needed for wheezing.     CARAFATE 1 G tablet  Generic drug:  sucralfate  Take 1 g by mouth daily.     dexamethasone 4 MG tablet   Commonly known as:  DECADRON  Take 1 tablet (4 mg total) by mouth 3 (three) times daily.     ipratropium 0.02 % nebulizer solution  Commonly known as:  ATROVENT  Take 500 mcg by nebulization every 4 (four) hours as needed for wheezing.     levETIRAcetam 1000 MG tablet  Commonly known as:  KEPPRA  Take 1 tablet (1,000 mg total) by mouth 2 (two) times daily.     nicotine 21 mg/24hr patch  Commonly known as:  NICODERM CQ - dosed in mg/24 hours  Place 1 patch onto the skin daily.     oxyCODONE-acetaminophen 5-325 MG per tablet  Commonly known as:  PERCOCET/ROXICET  Take 1 tablet by mouth every 4 (four) hours as needed for pain.     pentoxifylline 400 MG CR tablet  Commonly known as:  TRENTAL  Take 1 tablet (400 mg total) by mouth 2 (two) times daily with a meal.     prochlorperazine 10 MG tablet  Commonly known as:  COMPAZINE  Take 10 mg by mouth every 6 (six) hours as needed (nausea).     promethazine 25 MG tablet  Commonly known as:  PHENERGAN  Take 25 mg by mouth every 6 (six) hours as needed for nausea.  vitamin E 400 UNIT capsule  Commonly known as:  vitamin E  Take 1 capsule (400 Units total) by mouth 2 (two) times daily.           Follow-up Information   Follow up with [name], MD In 2 weeks.   Specialty:  Family Medicine   Contact information:   [address] [place] [address] [phone]       Follow up with Lajuana Matte., MD. (as scheduled )    Specialty:  Oncology   Contact information:   [address] [place] [address] [phone]       Follow up with Debbora Presto, MD. (As needed if symptoms worsen [phone])    Specialty:  Internal Medicine   Contact information:   [address] [place] [address] [phone]        The results of significant diagnostics from this hospitalization (including imaging, microbiology, ancillary and laboratory) are listed below for reference.      Microbiology: No results found for this or any previous visit (from the past 240 hour(s)).   Labs: Basic Metabolic Panel:  Recent Labs Lab 01/08/13 0946 01/08/13 0946 01/08/13 1820 01/08/13 1824 01/09/13 0452  NA 142  --  136 139 135  K 3.9  --  3.3* 3.4* 3.8  CL  --   --  101 101 101  CO2 25  --  27  --  25  GLUCOSE 100  --  102* 95 106*  BUN 11.9  --  10 10 11   CREATININE 0.7  --  0.58 0.80 0.58  CALCIUM 10.2  --  9.4  --  9.8  MG 1.9  --   --   --   --   PHOS  --  3.4  --   --   --    Liver Function Tests:  Recent Labs Lab 01/08/13 0946  AST 14  ALT 10  ALKPHOS 134  BILITOT 0.31  PROT 6.9  ALBUMIN 3.1*   CBC:  Recent Labs Lab 01/08/13 0946 01/08/13 1820 01/08/13 1824 01/09/13 0452  WBC 8.3 7.9  --  10.3  NEUTROABS 6.4 6.3  --   --   HGB 11.5* 11.0* 11.9* 11.8*  HCT 35.2 34.4* 35.0* 36.3  MCV 90.6 91.2  --  91.0  PLT 341 296  --  342   Cardiac Enzymes:  Recent Labs Lab 01/08/13 1820  TROPONINI <0.30   BNP: BNP (last 3 results)  Recent Labs  08/27/12 0200  PROBNP 635.4*   CBG:  Recent Labs Lab 01/08/13 1812  GLUCAP 89   SIGNED: Time coordinating discharge: Over 30 minutes  Debbora Presto, MD  [facility] 01/10/2013, 12:17 PM Pager [phone]  If 7PM-7AM, please contact night-coverage [email or web address] Password [email or web address]

## 2013-01-13 ENCOUNTER — Encounter: Payer: Self-pay | Admitting: Internal Medicine

## 2013-01-13 ENCOUNTER — Telehealth: Payer: Self-pay | Admitting: Internal Medicine

## 2013-01-13 ENCOUNTER — Other Ambulatory Visit (HOSPITAL_BASED_OUTPATIENT_CLINIC_OR_DEPARTMENT_OTHER): Payer: Medicaid Other | Admitting: Lab

## 2013-01-13 ENCOUNTER — Ambulatory Visit (HOSPITAL_BASED_OUTPATIENT_CLINIC_OR_DEPARTMENT_OTHER): Payer: Medicaid Other | Admitting: Physician Assistant

## 2013-01-13 ENCOUNTER — Ambulatory Visit: Payer: Self-pay

## 2013-01-13 ENCOUNTER — Encounter: Payer: Self-pay | Admitting: Physician Assistant

## 2013-01-13 DIAGNOSIS — C7931 Secondary malignant neoplasm of brain: Secondary | ICD-10-CM

## 2013-01-13 DIAGNOSIS — C3410 Malignant neoplasm of upper lobe, unspecified bronchus or lung: Secondary | ICD-10-CM

## 2013-01-13 DIAGNOSIS — G40802 Other epilepsy, not intractable, without status epilepticus: Secondary | ICD-10-CM

## 2013-01-13 LAB — CBC WITH DIFFERENTIAL/PLATELET
EOS%: 0.3 % (ref 0.0–7.0)
Eosinophils Absolute: 0 10*3/uL (ref 0.0–0.5)
HCT: 34.3 % — ABNORMAL LOW (ref 34.8–46.6)
LYMPH%: 2.8 % — ABNORMAL LOW (ref 14.0–49.7)
MCH: 29.4 pg (ref 25.1–34.0)
MCHC: 32.3 g/dL (ref 31.5–36.0)
MCV: 91.2 fL (ref 79.5–101.0)
MONO#: 0.7 10*3/uL (ref 0.1–0.9)
MONO%: 5.1 % (ref 0.0–14.0)
NEUT#: 12 10*3/uL — ABNORMAL HIGH (ref 1.5–6.5)
NEUT%: 91.4 % — ABNORMAL HIGH (ref 38.4–76.8)
Platelets: 376 10*3/uL (ref 145–400)
RBC: 3.77 10*6/uL (ref 3.70–5.45)
RDW: 20.5 % — ABNORMAL HIGH (ref 11.2–14.5)

## 2013-01-13 LAB — COMPREHENSIVE METABOLIC PANEL (CC13)
Alkaline Phosphatase: 117 U/L (ref 40–150)
Anion Gap: 10 mEq/L (ref 3–11)
CO2: 26 mEq/L (ref 22–29)
Creatinine: 0.7 mg/dL (ref 0.6–1.1)
Glucose: 99 mg/dl (ref 70–140)
Sodium: 141 mEq/L (ref 136–145)
Total Bilirubin: <0.2 mg/dL (ref 0.20–1.20)
Total Protein: 6.3 g/dL — ABNORMAL LOW (ref 6.4–8.3)

## 2013-01-13 MED ORDER — OXYCODONE-ACETAMINOPHEN 5-325 MG PO TABS: 1.0000 | ORAL_TABLET | ORAL | Status: DC | PRN

## 2013-01-13 MED ORDER — LEVETIRACETAM 1000 MG PO TABS: 1000.0000 mg | ORAL_TABLET | Freq: Two times a day (BID) | ORAL | Status: DC

## 2013-01-13 MED ORDER — PROMETHAZINE HCL 25 MG PO TABS: 25.0000 mg | ORAL_TABLET | Freq: Four times a day (QID) | ORAL | Status: AC | PRN

## 2013-01-13 NOTE — Progress Notes (Signed)
Need to see if she has applied for insurance.

## 2013-01-13 NOTE — Telephone Encounter (Signed)
Gave pt appt for lab and MD december 2014

## 2013-01-13 NOTE — Progress Notes (Signed)
I spoke with patient about her medicaid. She said app was done 12/03/12 and last name of case worker was [place] [employer]. I advised still self pay until approved and wanted to make sure she checks on status. 1st of year could get penalty for no insurance.

## 2013-01-13 NOTE — Progress Notes (Addendum)
[facility] [phone]   [phone]  SHARED VISIT PROGRESS NOTE  Melanie Hector, MD [address] [place] [address]  DIAGNOSIS: Metastatic non-small cell lung cancer, adenocarcinoma with negative EGFR mutation and negative ALK gene translocation diagnosed in September of 2013 with metastatic single brain lesion.   PRIOR THERAPY:  1) Status post stereotactic radiosurgery to the single brain lesion under the care Dr. Kathrynn Running.  2) Concurrent chemoradiation with chemotherapy in the form of weekly carboplatin for an AUC of 2 and paclitaxel at 45 mg per meter squared concurrent with radiation therapy under the care Dr. Kathrynn Running with partial response in her disease.  3) Systemic chemotherapy with carboplatin for AUC of 5 and Alimta 500 mg/M2 every 3 weeks. From cycle 3 forward she is receiving carboplatin for an AUC of 4 and Alimta at 375 mg per meter squared due to thrombocytopenia. Status post a total of 6 cycles with evidence for disease progression.  4) Subxiphoid pericardial window for drainage of pericardial effusion under the care of Dr. Donata Clay on 08/28/2012 and the final pathology showed no malignant cells in the pleural fluid.  5) Tarceva 150 mg by mouth daily. Therapy started 09/30/2012. Status post approximately 3 months of therapy  CURRENT THERAPY:  Patient currently being considered for the immunotherapy with Nivolumab clinical trial  CHEMOTHERAPY INTENT: Palliative  CURRENT # OF CHEMOTHERAPY CYCLES: Approximately 3 monthsof Tarceva  CURRENT ANTIEMETICS: None  CURRENT SMOKING STATUS: Current smoker  ORAL CHEMOTHERAPY AND CONSENT: yes  CURRENT BISPHOSPHONATES USE: none  PAIN MANAGEMENT: Percocet  NARCOTICS INDUCED CONSTIPATION: none  LIVING WILL AND CODE STATUS: No CODE BLUE.   INTERVAL HISTORY: Melanie Liu 62 y.o. female returns to the clinic today for followup visit accompanied by her daughter. She was seen in the  emergency department on 01/09/2013 for a seizure. Her Keppra dose was increased to 1000 mg by mouth twice daily. Patient reports that she only has 750 mg tablets at home and was not given a prescription for the 1000 mg tablets when she was discharged from the emergency department. Her dexamethasone was increased to 4 mg by mouth 3 times daily. She's had no further seizures. The patient is feeling fine today with no specific complaints. The patient denied having any chest pain, shortness breath, cough or hemoptysis. She denied having any weight loss or night sweats. She has no skin rash or diarrhea.    MEDICAL HISTORY: Past Medical History  Diagnosis Date  . Nodule of right lung     right upper lobe  . Anemia   . Rectovaginal fistula   . Right frontal lobe lesion 12/13/11    CT head  . History of radiation therapy 12/24/11-02/08/12    rul 66Gy/36fxs  . Dysphagia   . History of radiation therapy 12/27/11    SRS rt ant frontal 20gy  . Lung cancer 11/23/11    biopsy-right  . Brain metastases   . Chest pain at rest 08/26/2012  . COPD (chronic obstructive pulmonary disease)   . Shortness of breath     ALLERGIES:  is allergic to carboplatin and levaquin.  MEDICATIONS:  Current Outpatient Prescriptions  Medication Sig Dispense Refill  . albuterol (PROVENTIL) (5 MG/ML) 0.5% nebulizer solution Take 2.5 mg by nebulization every 6 (six) hours as needed for wheezing.      [name] dexamethasone (DECADRON) 4 MG tablet Take 1 tablet (4 mg total) by mouth 3 (three) times daily.  90 tablet  1  . ipratropium (ATROVENT) 0.02 % nebulizer solution Take 500 mcg by nebulization every 4 (four) hours as needed for wheezing.      . levETIRAcetam (KEPPRA) 1000 MG tablet Take 1 tablet (1,000 mg total) by mouth 2 (two) times daily.  60 tablet  0  . nicotine (NICODERM CQ - DOSED IN MG/24 HOURS) 21 mg/24hr patch Place 1 patch onto the skin daily.      . pentoxifylline (TRENTAL) 400 MG CR tablet Take 1 tablet (400 mg  total) by mouth 2 (two) times daily with a meal.  60 tablet  5  . prochlorperazine (COMPAZINE) 10 MG tablet Take 10 mg by mouth every 6 (six) hours as needed (nausea).       . promethazine (PHENERGAN) 25 MG tablet Take 1 tablet (25 mg total) by mouth every 6 (six) hours as needed for nausea.  30 tablet  1  . sucralfate (CARAFATE) 1 G tablet Take 1 g by mouth daily.      . vitamin E (VITAMIN E) 400 UNIT capsule Take 1 capsule (400 Units total) by mouth 2 (two) times daily.  60 capsule  5  . levETIRAcetam (KEPPRA) 1000 MG tablet Take 1 tablet (1,000 mg total) by mouth 2 (two) times daily.  60 tablet  0  . oxyCODONE-acetaminophen (PERCOCET/ROXICET) 5-325 MG per tablet Take 1 tablet by mouth every 4 (four) hours as needed.  60 tablet  0   No current facility-administered medications for this visit.    SURGICAL HISTORY:  Past Surgical History  Procedure Laterality Date  . Partial hysterectomy    . Hemmorhoid surgery    . Neck surgery    . Retinal detachment surgery    . Esophagogastroduodenoscopy N/A 05/01/2012    Procedure: ESOPHAGOGASTRODUODENOSCOPY (EGD);  Surgeon: Shirley Friar, MD;  Location: [facility];  Service: Endoscopy;  Laterality: N/A;  . Subxyphoid pericardial window N/A 08/28/2012    Procedure: SUBXYPHOID PERICARDIAL WINDOW;  Surgeon: Kerin Perna, MD;  Location: [facility] OR;  Service: Thoracic;  Laterality: N/A;  . Tee without cardioversion  08/28/2012    Procedure: TRANSESOPHAGEAL ECHOCARDIOGRAM (TEE);  Surgeon: Kerin Perna, MD;  Location: [facility] OR;  Service: Thoracic;;  . Eye surgery    . Tubal ligation    . Mass excision Right 09/26/2012    Procedure: EXCISION MASS;  Surgeon: Kerin Perna, MD;  Location: [facility] OR;  Service: Vascular;  Laterality: Right;  EXCISION OF RIGHT NECK  MASS  . Abdominal hysterectomy  1988    [facility] - Dr. Christie Beckers    REVIEW OF SYSTEMS:  Constitutional: negative Eyes: negative Ears, nose, mouth, throat, and face: negative Respiratory:  negative Cardiovascular: negative Gastrointestinal: negative Genitourinary:negative Integument/breast: negative Hematologic/lymphatic: negative Musculoskeletal:negative Neurological: negative Behavioral/Psych: negative Endocrine: negative Allergic/Immunologic: negative   PHYSICAL EXAMINATION: General appearance: alert, cooperative and no distress Head: Normocephalic, without obvious abnormality, atraumatic Neck: no adenopathy, no JVD, supple, symmetrical, trachea midline and thyroid not enlarged, symmetric, no tenderness/mass/nodules Lymph nodes: Cervical, supraclavicular, and axillary nodes normal. Resp: clear to auscultation bilaterally Back: symmetric, no curvature. ROM normal. No CVA tenderness. Cardio: regular rate and rhythm, S1, S2 normal, no murmur, click, rub or gallop GI: soft, non-tender; bowel sounds normal; no masses,  no organomegaly Extremities: extremities normal, atraumatic, no cyanosis or edema Neurologic: Alert and oriented X 3, normal strength and tone. Normal symmetric reflexes. Normal coordination and gait  ECOG PERFORMANCE STATUS: 1 - Symptomatic but completely ambulatory  Blood pressure 117/79, pulse 81, temperature 98  F (36.7 C), temperature source Oral, resp. rate 18, height 5\' 1"  (1.549 m), weight 105 lb 1.6 oz (47.673 kg).  LABORATORY DATA: Lab Results  Component Value Date   WBC 13.2* 01/13/2013   HGB 11.1* 01/13/2013   HCT 34.3* 01/13/2013   MCV 91.2 01/13/2013   PLT 376 01/13/2013      Chemistry      Component Value Date/Time   NA 141 01/13/2013 1036   NA 135 01/09/2013 0452   K 4.3 01/13/2013 1036   K 3.8 01/09/2013 0452   CL 101 01/09/2013 0452   CL 102 08/18/2012 0938   CO2 26 01/13/2013 1036   CO2 25 01/09/2013 0452   BUN 18.6 01/13/2013 1036   BUN 11 01/09/2013 0452   CREATININE 0.7 01/13/2013 1036   CREATININE 0.58 01/09/2013 0452      Component Value Date/Time   CALCIUM 9.8 01/13/2013 1036   CALCIUM 9.8 01/09/2013 0452   ALKPHOS  117 01/13/2013 1036   ALKPHOS 103 11/20/2012 0415   AST 13 01/13/2013 1036   AST 15 11/20/2012 0415   ALT 9 01/13/2013 1036   ALT <5 11/20/2012 0415   BILITOT <0.20 01/13/2013 1036   BILITOT 0.2* 11/20/2012 0415       RADIOGRAPHIC STUDIES: Dg Chest 2 View  01/08/2013   CLINICAL DATA:  Chest pain. Patient where the metastatic lung cancer.  EXAM: CHEST  2 VIEW  COMPARISON:  The chest CT 12/30/2012 and chest radiograph 11/20/2012  FINDINGS: The heart size is within normal limits and stable. A large opacity at the right lung apex with associated volume loss appears similar to the scout view of the chest CT performed 12/30/2012. The right hilum is elevated, stable. There are emphysematous changes bilaterally. No visible pleural effusion. The bones are osteopenic.  IMPRESSION: Large opacity at the right lung apex is unchanged compared to recent CT performed 12/30/2012. When correlated with recent CT, this likely reflects a combination of the patient's known right apical malignancy, and postobstructive pneumonitis/infection.  Emphysema.   Electronically Signed   By: Britta Mccreedy M.D.   On: 01/08/2013 18:48   Ct Head Wo Contrast  01/08/2013   CLINICAL DATA:  Left-sided arm and leg weakness. Seizure. History of brain cancer with external beam radiation therapy. Confusion. New onset seizures.  EXAM: CT HEAD WITHOUT CONTRAST  TECHNIQUE: Contiguous axial images were obtained from the base of the skull through the vertex without intravenous contrast.  COMPARISON:  MRI 01/07/2013.  FINDINGS: There is no hemorrhage. Vasogenic edema in the right parietal and posterior right frontal lobe appears similar to the prior MRI allowing for differences in technique. There is no midline shift. No hydrocephalus. Vasogenic edema is also present in the inferior right frontal lobe associated with metastatic disease. Calvarium appears within normal limits.  IMPRESSION: No intracranial hemorrhage or interval change compared to MRI  yesterday. Findings compatible with cerebral metastatic disease with associated vasogenic edema the.   Electronically Signed   By: Andreas Newport M.D.   On: 01/08/2013 18:29   Ct Chest W Contrast  12/30/2012   CLINICAL DATA:  Right-sided lung cancer. Restaging of metastatic non-small-cell lung cancer. Radiation therapy finishing 12/13. Brain metastasis.  EXAM: CT CHEST, ABDOMEN, AND PELVIS WITH CONTRAST  TECHNIQUE: Multidetector CT imaging of the chest, abdomen and pelvis was performed following the standard protocol during bolus administration of intravenous contrast.  CONTRAST:  80mL OMNIPAQUE IOHEXOL 300 MG/ML  SOLN  COMPARISON:  Chest CT 08/27/2012. Abdominal pelvic CT most  recent 08/22/2012.  FINDINGS: CT CHEST FINDINGS  Lungs/Pleura: Moderate centrilobular emphysema.  Marked enlargement of a right lower lobe subpleural nodule. This measures 1.3 cm on image 31 versus 4 mm on the prior exam.  Posterior right upper lobe lung mass is difficult to differentiate from surrounding airspace disease. On the order of 5.5 x 5.9 cm on image 13/series 5. Similar to 6.1 x 6.1 cm at the same level on the prior exam. Surrounding airspace disease which is minimally increased and likely due to a combination of infection and postobstructive pneumonitis.  Loculated circumferential right-sided pleural fluid suspected and not significantly changed. No left-sided pleural effusion  Heart/Mediastinum: No supraclavicular adenopathy. Normal heart size. Resolution of previously described large pericardial effusion. No central pulmonary embolism, on this non-dedicated study. No mediastinal adenopathy.  CT ABDOMEN AND PELVIS FINDINGS  Abdomen/Pelvis: An inferior right liver lobe lesion measures 4 mm on image 73/ series 2 and is less conspicuous than 7 mm on the prior exam. Low-density adjacent to falciform ligament is also less conspicuous today and measures 7 mm on image 64. Hepatomegaly, with liver measuring 20.6 cm craniocaudal. No  new liver lesions are seen. Normal spleen, stomach, pancreas. Gallstones without acute cholecystitis or biliary ductal dilatation. Normal adrenal glands and right kidney. Too small to characterize left renal lesion.  Advanced aortic atherosclerosis. No retroperitoneal or retrocrural adenopathy. Normal colon and terminal ileum. Normal small bowel without abdominal ascites. Question surgical changes in the right common iliac artery. No pelvic adenopathy. Tiny fat containing left inguinal hernia. Hysterectomy. Normal urinary bladder. No adnexal mass or significant free fluid.  Mild osteopenia. Lower cervical spine fixation. Disc bulges at L4-5 and L5-S1.  IMPRESSION: CT CHEST IMPRESSION  1. Interval enlargement of a right lower lobe lung nodule, consistent with metastatic disease. 2. Similar right upper lobe lung mass with slight increase in surrounding airspace disease. Infection and/or postobstructive pneumonitis. 3. Interval resolution of pericardial effusion.  CT ABDOMEN AND PELVIS IMPRESSION  1. Apparent slight decrease conspicuity of 2 liver lesions. The more inferior is favored to represent a tiny cyst and was readily apparent on 11/14/2011 PET. The left liver lesion is suspicious for focal steatosis and warrants followup attention. 2. No new or progressive disease within the abdomen/ pelvis. 3. Cholelithiasis.   Electronically Signed   By: Jeronimo Greaves M.D.   On: 12/30/2012 10:50   Mr [name] WU Contrast  01/07/2013   CLINICAL DATA:  Metastatic lung cancer. Stereotactic radiosurgery, restaging.  EXAM: MRI HEAD WITHOUT AND WITH CONTRAST  TECHNIQUE: Multiplanar, multiecho pulse sequences of the brain and surrounding structures were obtained according to standard protocol without and with intravenous contrast  CONTRAST:  9mL MULTIHANCE GADOBENATE DIMEGLUMINE 529 MG/ML IV SOLN  COMPARISON:  MR head 10/28/2012 most recent. CT head 11/20/2012 also recent.  FINDINGS: Interval growth of 3 treated intracranial  lesions is accompanied by a increasing edema and central restricted diffusion of the 2 largest lesions:  Right inferior frontal lobe now 6 x 9 mm as compared with 5 x 7 previously, with moderate surrounding edema.  Right parietal periatrial lesion now 16 x 17 mm as compared with 12 x 13 mm previously. Increased echogenic edema.  Right anterior parietal convexity near parasagittal metastasis now 20 x 23 mm as compared with 15 x 14 mm, also with significant vasogenic edema worsening.  No enhancing left hemisphere lesions observed.  No new metastatic deposits are observed. Unchanged atrophy and small vessel disease. No osseous lesions. 1-2 mm midline shift.  IMPRESSION: Interval worsening particularly of the two right parietal lesions. Correlate clinically for post treatment change versus progression of metastatic disease.   Electronically Signed   By: Davonna Belling M.D.   On: 01/07/2013 16:14   Ct Abdomen Pelvis W Contrast  12/30/2012   CLINICAL DATA:  Right-sided lung cancer. Restaging of metastatic non-small-cell lung cancer. Radiation therapy finishing 12/13. Brain metastasis.  EXAM: CT CHEST, ABDOMEN, AND PELVIS WITH CONTRAST  TECHNIQUE: Multidetector CT imaging of the chest, abdomen and pelvis was performed following the standard protocol during bolus administration of intravenous contrast.  CONTRAST:  80mL OMNIPAQUE IOHEXOL 300 MG/ML  SOLN  COMPARISON:  Chest CT 08/27/2012. Abdominal pelvic CT most recent 08/22/2012.  FINDINGS: CT CHEST FINDINGS  Lungs/Pleura: Moderate centrilobular emphysema.  Marked enlargement of a right lower lobe subpleural nodule. This measures 1.3 cm on image 31 versus 4 mm on the prior exam.  Posterior right upper lobe lung mass is difficult to differentiate from surrounding airspace disease. On the order of 5.5 x 5.9 cm on image 13/series 5. Similar to 6.1 x 6.1 cm at the same level on the prior exam. Surrounding airspace disease which is minimally increased and likely due to a  combination of infection and postobstructive pneumonitis.  Loculated circumferential right-sided pleural fluid suspected and not significantly changed. No left-sided pleural effusion  Heart/Mediastinum: No supraclavicular adenopathy. Normal heart size. Resolution of previously described large pericardial effusion. No central pulmonary embolism, on this non-dedicated study. No mediastinal adenopathy.  CT ABDOMEN AND PELVIS FINDINGS  Abdomen/Pelvis: An inferior right liver lobe lesion measures 4 mm on image 73/ series 2 and is less conspicuous than 7 mm on the prior exam. Low-density adjacent to falciform ligament is also less conspicuous today and measures 7 mm on image 64. Hepatomegaly, with liver measuring 20.6 cm craniocaudal. No new liver lesions are seen. Normal spleen, stomach, pancreas. Gallstones without acute cholecystitis or biliary ductal dilatation. Normal adrenal glands and right kidney. Too small to characterize left renal lesion.  Advanced aortic atherosclerosis. No retroperitoneal or retrocrural adenopathy. Normal colon and terminal ileum. Normal small bowel without abdominal ascites. Question surgical changes in the right common iliac artery. No pelvic adenopathy. Tiny fat containing left inguinal hernia. Hysterectomy. Normal urinary bladder. No adnexal mass or significant free fluid.  Mild osteopenia. Lower cervical spine fixation. Disc bulges at L4-5 and L5-S1.  IMPRESSION: CT CHEST IMPRESSION  1. Interval enlargement of a right lower lobe lung nodule, consistent with metastatic disease. 2. Similar right upper lobe lung mass with slight increase in surrounding airspace disease. Infection and/or postobstructive pneumonitis. 3. Interval resolution of pericardial effusion.  CT ABDOMEN AND PELVIS IMPRESSION  1. Apparent slight decrease conspicuity of 2 liver lesions. The more inferior is favored to represent a tiny cyst and was readily apparent on 11/14/2011 PET. The left liver lesion is suspicious for  focal steatosis and warrants followup attention. 2. No new or progressive disease within the abdomen/ pelvis. 3. Cholelithiasis.   Electronically Signed   By: Jeronimo Greaves M.D.   On: 12/30/2012 10:50    ASSESSMENT AND PLAN:  This is a very pleasant 63 years old white female with metastatic non-small cell lung cancer, adenocarcinoma currently on treatment with Tarceva 150 mg by mouth daily status post 3 months of treatment. She is currently off the Tarceva and is being considered for the immunotherapy with Nivolumab clinical problem. Her recent seizure may have been related to a missed dose (s) of Keppra. Patient was discussed with an  also seen by [name]. She is to continue on Keppra at 1000 mg by mouth twice daily. Patient is given a bridge prescription of 60 tablets of the Keppra 1000 mg dosage to be taken 1 tablet twice daily with no refill. For the refill should come from her primary care physician. Patient voiced understanding. Her MRI of the brain was reviewed by Dr. Kathrynn Running who fell that it represented postradiation effects or subacute radionecrosis. We will have the patient taper her dexamethasone dose as follows 4 mg by mouth twice daily for 3 days, 2 mg by mouth twice daily for 3 days, 2 mg once daily by mouth for 3 days then decreasing to 1 mg by mouth daily until advised otherwise. Patient and her daughter voiced understanding. She'll followup with [name] in approximately 3 weeks with repeat CBC differential and C. met for reevaluation. When she returns we will see if she will still be eligible to begin the immunotherapy clinical with Nivolumab.   Trishelle Devora E, PA-C   For smoke cessation, I strongly advise the patient to quit smoking and again offered to smoke cessation program. The patient voices understanding of current disease status and treatment options and is in agreement with the current care plan.  All questions were answered. The patient knows to call the clinic with  any problems, questions or concerns. We can certainly see the patient much sooner if necessary.  ADDENDUM: Hematology/Oncology Attending: I had a face to face encounter with the patient.. I recommended her care plan. This is a very pleasant 62 years old white female with metastatic non-small cell lung cancer with recurrent brain metastasis recently treated with stereotactic radiotherapy. The patient was also treated with several chemotherapy regimens. She is currently considered for clinical trial with immunotherapy, Nivolumab. Unfortunately she was recently admitted to [facility] with seizure activity and there was some evidence for vasogenic edema around the previously treated brain lesions. Her dose of Keppra was increased to 1000 mg by mouth twice a day. Her Decadron dose was also increased to 8 mg by mouth twice a day. Her MRI was reviewed by Dr. Kathrynn Running and he felt that it could represent tumor necrosis. She is considering referring the patient to Dr. Angelyn Punt at [facility] for consideration of AUTOLITT treatment which may help [place] to reduce the dose of her Decadron and increase the potential for this patient to be eligible for the immunotherapy trial. We gave the patient a schedule to taper her Decadron. She would come back for follow up visit and less than 3 weeks for reevaluation and discussion of her treatment options. Lajuana Matte., MD 01/14/2013   .

## 2013-01-14 ENCOUNTER — Other Ambulatory Visit: Payer: Self-pay | Admitting: Radiation Oncology

## 2013-01-14 ENCOUNTER — Telehealth: Payer: Self-pay | Admitting: *Deleted

## 2013-01-14 DIAGNOSIS — C7931 Secondary malignant neoplasm of brain: Secondary | ICD-10-CM

## 2013-01-14 NOTE — Patient Instructions (Signed)
Continue Keppra at 1000 mg by mouth twice daily Taper your dexamethasone as instructed Followup in 3 weeks

## 2013-01-14 NOTE — Telephone Encounter (Signed)
Called patient to ask her about  Arranging an appt. With Dr. Brooke Pace, lvm for a return call

## 2013-01-14 NOTE — Progress Notes (Signed)
  Radiation Oncology         [phone] ________________________________  Name: Melanie Liu MRN: [id number]  Date: 01/14/2013  DOB: December 01, 1950  Chart Note:  I reviewed this patient's most recent findings and wanted to take a minute to document my impression.  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  Impression:  In light of this information, I spoke with Dr. Angelyn Punt at [facility].  Plan:  At this point, the patient is set up to proceed with consult with Dr. Angelyn Punt.  ________________________________  Melanie Liu, M.D.

## 2013-01-15 ENCOUNTER — Telehealth: Payer: Self-pay | Admitting: *Deleted

## 2013-01-15 NOTE — Telephone Encounter (Signed)
CALLED PATIENT TO INFORM OF APPT. WITH DR. TATTER ON 01-19-13- ARRIVAL TIME - 10 AM , NO ANSWER WILL CALL LATER

## 2013-01-15 NOTE — Telephone Encounter (Signed)
Called patient to inform of appt. With Dr. Angelyn Punt on 01-19-13- arrival time - 10 am, spoke with patient and she is aware of this appt.

## 2013-01-16 ENCOUNTER — Telehealth: Payer: Self-pay | Admitting: *Deleted

## 2013-01-16 NOTE — Telephone Encounter (Signed)
XXXX 

## 2013-01-16 NOTE — Telephone Encounter (Signed)
Called patient to verify appt. For this patient on 01-19-13 at 10 am, spoke with Diane and she told me that the appt. Has been changed to 01-20-13 at 2 pm with Dr. Angelyn Punt, spoke with patient and she is aware of the appt. Change.

## 2013-01-27 ENCOUNTER — Other Ambulatory Visit: Payer: Medicaid Other

## 2013-01-28 ENCOUNTER — Ambulatory Visit: Payer: Medicaid Other | Admitting: Radiation Oncology

## 2013-01-30 ENCOUNTER — Other Ambulatory Visit: Payer: Self-pay | Admitting: *Deleted

## 2013-02-03 ENCOUNTER — Telehealth: Payer: Self-pay | Admitting: Internal Medicine

## 2013-02-03 NOTE — Telephone Encounter (Signed)
pt daughter in law called to cx appt .Marland KitchenMarland Kitchenpt in [facility]..will call back to sched

## 2013-02-04 ENCOUNTER — Other Ambulatory Visit: Payer: Medicaid Other | Admitting: Lab

## 2013-02-04 ENCOUNTER — Ambulatory Visit: Payer: Medicaid Other | Admitting: Internal Medicine

## 2013-02-12 ENCOUNTER — Encounter: Payer: Self-pay | Admitting: *Deleted

## 2013-02-12 ENCOUNTER — Telehealth: Payer: Self-pay | Admitting: Radiation Oncology

## 2013-02-12 ENCOUNTER — Ambulatory Visit
Admission: RE | Admit: 2013-02-12 | Discharge: 2013-02-12 | Disposition: A | Discharge status: Home / Self Care | Payer: Medicaid Other | Source: Ambulatory Visit | Attending: Radiation Oncology | Admitting: Radiation Oncology

## 2013-02-12 ENCOUNTER — Encounter: Payer: Self-pay | Admitting: Radiation Oncology

## 2013-02-12 VITALS — BP 107/60 | HR 114 | Temp 97.5°F | Resp 18 | Wt 107.8 lb

## 2013-02-12 DIAGNOSIS — C7931 Secondary malignant neoplasm of brain: Secondary | ICD-10-CM

## 2013-02-12 DIAGNOSIS — R918 Other nonspecific abnormal finding of lung field: Secondary | ICD-10-CM

## 2013-02-12 MED ORDER — DEXAMETHASONE 4 MG PO TABS: 4.0000 mg | ORAL_TABLET | ORAL | Status: DC

## 2013-02-12 NOTE — Telephone Encounter (Signed)
Called in decadron 4 mg taper per Dr. Kathrynn Running to Babs Sciara at [facility]. Presently, patient at the pharmacy waiting to pick up medication.

## 2013-02-12 NOTE — Progress Notes (Signed)
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 She received 66 Gy in 33 fractions to the primary tumor in the right upper lobe of the lung 12/24/2011-02/08/2012  3. Stereotactic radiosurgery to her new Lt Frontal 5 mm and Rt Parietal 18 mm mets 04/14/2012  4. SRS to a 4.8 mm right parietal brain metastasis to a prescription dose of 20 Gy on 08/04/2012 5. AutoLITT on 11/22/14th Dr. Angelyn Punt for her 2 enlarging right parietal lesions  Interval Since Last Radiation:  6 months  Narrative:  The patient returns today for routine follow-up.  She underwent biopsy and AutoLITT treatment of her two enlarging, symptomatic right parietal lesions, which was notable for pre-treatment admission with hemiparesis and post-treatment rehab stint for a 3 week hospitalization.  Following hospitalization patient reports she was instructed to take decadron 4 mg daily until all gone. Patient reports she took her last dose yesterday. Reports nausea yesterday but, that compazine helped to relieve it. Denies any vomiting. Denies headache, diplopia or ringing in the ears. Slow shuffling gait noted. Weight stable. Reports that she has a great appetite. Reports mild fatigue. Reports pain in both hips down to her knees. Reports she has to take percocet regularly to manage this pain. Denies cough, shortness of breath or difficulty coughing. Reports dizziness when riding in the car and this is new onset                               ALLERGIES:  is allergic to carboplatin and levaquin.  Meds: Current Outpatient Prescriptions  Medication Sig Dispense Refill  . albuterol (PROVENTIL) (5  MG/ML) 0.5% nebulizer solution Take 2.5 mg by nebulization every 6 (six) hours as needed for wheezing.      [name] ipratropium (ATROVENT) 0.02 % nebulizer solution Take 500 mcg by nebulization every 4 (four) hours as needed for wheezing.      . levETIRAcetam (KEPPRA) 1000 MG tablet Take 1 tablet (1,000 mg total) by mouth 2 (two) times daily.  60 tablet  0  . nicotine (NICODERM CQ - DOSED IN MG/24 HOURS) 21 mg/24hr patch Place 1 patch onto the skin daily.      [name] oxyCODONE-acetaminophen (PERCOCET/ROXICET) 5-325 MG per tablet Take 1 tablet by mouth every 4 (four) hours as needed.  60 tablet  0  . pentoxifylline (TRENTAL) 400 MG CR tablet Take 1 tablet (400 mg total) by mouth 2 (two) times daily with a meal.  60 tablet  5  . prochlorperazine (COMPAZINE) 10 MG tablet Take 10 mg by mouth every 6 (six) hours as needed (nausea).       . vitamin E (VITAMIN E) 400 UNIT capsule Take 1 capsule (400 Units total) by mouth 2 (two) times daily.  60 capsule  5  . dexamethasone (DECADRON) 4 MG tablet Take 1 tablet (4 mg total) by mouth as directed. Take half pill (2mg ) twice daily for one week, then half daily (2mg ) once daily for one week then stop.  30 tablet  1  . levETIRAcetam (KEPPRA) 1000 MG tablet Take 1 tablet (1,000 mg  total) by mouth 2 (two) times daily.  60 tablet  0  . promethazine (PHENERGAN) 25 MG tablet Take 1 tablet (25 mg total) by mouth every 6 (six) hours as needed for nausea.  30 tablet  1  . sucralfate (CARAFATE) 1 G tablet Take 1 g by mouth daily.       No current facility-administered medications for this encounter.    Physical Findings: The patient is in no acute distress. Patient is alert and oriented.  weight is 107 lb 12.8 oz (48.898 kg). Her oral temperature is 97.5 F (36.4 C). Her blood pressure is 107/60 and her pulse is 114. Her respiration is 18 and oxygen saturation is 100%. . No focal motor deficits at this time.  Some proximal muscle weakness.  No significant changes.  Lab  Findings: Lab Results  Component Value Date   WBC 13.2* 01/13/2013   HGB 11.1* 01/13/2013   HCT 34.3* 01/13/2013   MCV 91.2 01/13/2013   PLT 376 01/13/2013   Impression:  The patient is recovering from AutoLITT with improvement in symptoms tapering off steroids.  One of her lesions had persistent/recurrent cancer, and may have some residual disease, requiring close follow-up.  Plan:  Continue dex taper to 2 mg BID for one week, the 2 mg QD for one week then stop.  She has an [facility] at [facility] in January and follow up with [place].  We will see her back in brain clinic after that.  _____________________________________  Artist Pais. Kathrynn Running, M.D.

## 2013-02-12 NOTE — Progress Notes (Signed)
02/12/13 Patient currently participating in the BMS 118 observational study. She had also signed consent for the BMS CA209-153 study but was hospitalized before completing eligibility and registration.   She is in the cancer center today for follow up appointment with Radiation Oncology. She does not have appointments scheduled with Medical oncology. Met briefly with Mrs. Hart Rochester. She is in a wheelchair. She is aware that registration for the BMS 153 study has been suspended. I explaind that we would address that again when she saw [name] if she was interested and he thought it would be appropriate treatment for her.  Today, she completed a release of information form so we can access her records from [facility]. She declined to completed PRO today,

## 2013-02-12 NOTE — Progress Notes (Signed)
Following hospitalization patient reports she was instructed to take decadron 4 mg daily until all gone. Patient reports she took her last dose yesterday. Reports nausea yesterday but, that compazine helped to relieve it. Denies any vomiting. Denies headache, diplopia or ringing in the ears. Slow shuffling gait noted. Weight stable. Reports that she has a great appetite. Reports mild fatigue. Reports pain in both hips down to her knees. Reports she has to take percocet regularly to manage this pain. Denies cough, shortness of breath or difficulty coughing. Reports dizziness when riding in the car and this is new onset.

## 2013-02-13 ENCOUNTER — Encounter: Payer: Self-pay | Admitting: Radiation Oncology

## 2013-02-17 ENCOUNTER — Telehealth: Payer: Self-pay | Admitting: Radiation Oncology

## 2013-02-17 NOTE — Telephone Encounter (Signed)
Patient's son in law, Casimiro Needle, phoned questioning if there was a refill available on his mother in law's Keppra. Explained there is one refill left per [facility] at [facility]. Oretha Milch reports the patient has $41 dollars left in her cancer center fund. Explained to the son in law with the patient's permission that the script cost $19.10 therefore, her funds will cover this script. He verbalized understanding and reports he is on his way to pick up this medication.

## 2013-02-24 ENCOUNTER — Other Ambulatory Visit: Payer: Self-pay | Admitting: *Deleted

## 2013-02-24 ENCOUNTER — Telehealth: Payer: Self-pay | Admitting: Internal Medicine

## 2013-02-24 NOTE — Telephone Encounter (Signed)
S/w the pt and she is aware of her r/s appt to 03/02/2013 with the pa. Pt missed her regular appt.

## 2013-03-02 ENCOUNTER — Encounter: Payer: Self-pay | Admitting: *Deleted

## 2013-03-02 ENCOUNTER — Ambulatory Visit (HOSPITAL_BASED_OUTPATIENT_CLINIC_OR_DEPARTMENT_OTHER): Payer: Medicaid Other | Admitting: Physician Assistant

## 2013-03-02 ENCOUNTER — Telehealth: Payer: Self-pay | Admitting: Internal Medicine

## 2013-03-02 ENCOUNTER — Encounter (INDEPENDENT_AMBULATORY_CARE_PROVIDER_SITE_OTHER): Payer: Self-pay

## 2013-03-02 DIAGNOSIS — C3410 Malignant neoplasm of upper lobe, unspecified bronchus or lung: Secondary | ICD-10-CM

## 2013-03-02 DIAGNOSIS — C7931 Secondary malignant neoplasm of brain: Secondary | ICD-10-CM

## 2013-03-02 NOTE — Progress Notes (Signed)
03/02/13 Melanie Liu is in the cancer center today for a physical exam. She independently completed the PROs for the BMS 118 study before seeing the provider. She has also been in screening for the BMS [id number] study. This has been delayed D/T the required interventions and corticosteroid medication for metastatic brain disease. She is seen today to evaluate her potential return to her baseline and the possibility of her re-screening for participation in the BMS 153 Nivolumab study.  Melanie Liu told [name] and myself that she does not want to participate in the immune therapy study. She is under the impression from her neurosurgeon that her brain could not tolerate the therapy. [name] spoke with her to explain that the immune therapy does not cross the blood brain barrier and should not have any effect on her brain. I explained that she could participate in the study if she met criteria for adequately treated brain disease. She is adamant that she does not want to participate.  She will continue to see [name] in follow up.

## 2013-03-02 NOTE — Telephone Encounter (Signed)
gv and printed appt sched and avs for pt for Jan 2015 °

## 2013-03-02 NOTE — Progress Notes (Addendum)
[facility] [phone]   [phone]  SHARED VISIT PROGRESS NOTE  [name], MD [address] [place] [address]  DIAGNOSIS: Metastatic non-small cell lung cancer, adenocarcinoma with negative EGFR mutation and negative ALK gene translocation diagnosed in September of 2013 with metastatic single brain lesion.   PRIOR THERAPY:  1) Status post stereotactic radiosurgery to the single brain lesion under the care Dr. Kathrynn Running.  2) Concurrent chemoradiation with chemotherapy in the form of weekly carboplatin for an AUC of 2 and paclitaxel at 45 mg per meter squared concurrent with radiation therapy under the care Dr. Kathrynn Running with partial response in her disease.  3) Systemic chemotherapy with carboplatin for AUC of 5 and Alimta 500 mg/M2 every 3 weeks. From cycle 3 forward she is receiving carboplatin for an AUC of 4 and Alimta at 375 mg per meter squared due to thrombocytopenia. Status post a total of 6 cycles with evidence for disease progression.  4) Subxiphoid pericardial window for drainage of pericardial effusion under the care of Dr. Donata Clay on 08/28/2012 and the final pathology showed no malignant cells in the pleural fluid.  5) Tarceva 150 mg by mouth daily. Therapy started 09/30/2012. Status post approximately 3 months of therapy. 6) stereotactic biopsy followed by MRI- Thermometry guided laser interstitial Thermal therapy  (LITT therapy) at [facility] under the care of Dr. Brooke Pace on 01/25/2013.  CURRENT THERAPY:  Patient currently being considered for the immunotherapy with Nivolumab clinical trial  CHEMOTHERAPY INTENT: Palliative  CURRENT # OF CHEMOTHERAPY CYCLES: Approximately 3 monthsof Tarceva  CURRENT ANTIEMETICS: None  CURRENT SMOKING STATUS: Current smoker  ORAL CHEMOTHERAPY AND CONSENT: yes  CURRENT BISPHOSPHONATES USE: none  PAIN MANAGEMENT: Percocet  NARCOTICS INDUCED CONSTIPATION: none  LIVING  WILL AND CODE STATUS: No CODE BLUE.   INTERVAL HISTORY: Melanie Liu 62 y.o. female returns to the clinic today for followup visit accompanied by a friend. She was seen in the emergency department on 01/09/2013 for a seizure. Her Keppra dose was increased to 1000 mg by mouth twice daily. She reports that she completed her Decadron taper on 02/25/2013. She's had no further seizures. She reports that her strength is improving. She's had no falls or near falls since she is not dropping any items. She presents today for reconsideration him for enrollment in the BMS clinical trial with Nivolumab. The patient is feeling fine today with no specific complaints. The patient denied having any chest pain, shortness breath, cough or hemoptysis. She denied having any weight loss or night sweats. She has no skin rash or diarrhea.    MEDICAL HISTORY: Past Medical History  Diagnosis Date  . Nodule of right lung     right upper lobe  . Anemia   . Rectovaginal fistula   . Right frontal lobe lesion 12/13/11    CT head  . History of radiation therapy 12/24/11-02/08/12    rul 66Gy/32fxs  . Dysphagia   . History of radiation therapy 12/27/11    SRS rt ant frontal 20gy  . Lung cancer 11/23/11    biopsy-right  . Brain metastases   . Chest pain at rest 08/26/2012  . COPD (chronic obstructive pulmonary disease)   . Shortness of breath     ALLERGIES:  is allergic to carboplatin and levaquin.  MEDICATIONS:  Current Outpatient Prescriptions  Medication Sig Dispense Refill  . albuterol (PROVENTIL) (5 MG/ML) 0.5% nebulizer solution Take 2.5 mg by nebulization every 6 (six)  hours as needed for wheezing.      [name] ipratropium (ATROVENT) 0.02 % nebulizer solution Take 500 mcg by nebulization every 4 (four) hours as needed for wheezing.      . levETIRAcetam (KEPPRA) 1000 MG tablet Take 1 tablet (1,000 mg total) by mouth 2 (two) times daily.  60 tablet  0  . oxyCODONE-acetaminophen (PERCOCET/ROXICET) 5-325 MG per tablet  Take 1 tablet by mouth every 4 (four) hours as needed.  60 tablet  0  . pentoxifylline (TRENTAL) 400 MG CR tablet Take 1 tablet (400 mg total) by mouth 2 (two) times daily with a meal.  60 tablet  5  . prochlorperazine (COMPAZINE) 10 MG tablet Take 10 mg by mouth every 6 (six) hours as needed (nausea).       . promethazine (PHENERGAN) 25 MG tablet Take 1 tablet (25 mg total) by mouth every 6 (six) hours as needed for nausea.  30 tablet  1  . ranitidine (ZANTAC) 150 MG tablet 150 mg.      . sucralfate (CARAFATE) 1 G tablet Take 1 g by mouth daily.      . vitamin Liu (VITAMIN Liu) 400 UNIT capsule Take 1 capsule (400 Units total) by mouth 2 (two) times daily.  60 capsule  5  . dexamethasone (DECADRON) 4 MG tablet Take 1 tablet (4 mg total) by mouth as directed. Take half pill (2mg ) twice daily for one week, then half daily (2mg ) once daily for one week then stop.  30 tablet  1  . nicotine (NICODERM CQ - DOSED IN MG/24 HOURS) 21 mg/24hr patch Place 1 patch onto the skin daily.       No current facility-administered medications for this visit.    SURGICAL HISTORY:  Past Surgical History  Procedure Laterality Date  . Partial hysterectomy    . Hemmorhoid surgery    . Neck surgery    . Retinal detachment surgery    . Esophagogastroduodenoscopy N/A 05/01/2012    Procedure: ESOPHAGOGASTRODUODENOSCOPY (EGD);  Surgeon: Shirley Friar, MD;  Location: [facility];  Service: Endoscopy;  Laterality: N/A;  . Subxyphoid pericardial window N/A 08/28/2012    Procedure: SUBXYPHOID PERICARDIAL WINDOW;  Surgeon: Kerin Perna, MD;  Location: [facility] OR;  Service: Thoracic;  Laterality: N/A;  . Tee without cardioversion  08/28/2012    Procedure: TRANSESOPHAGEAL ECHOCARDIOGRAM (TEE);  Surgeon: Kerin Perna, MD;  Location: [facility] OR;  Service: Thoracic;;  . Eye surgery    . Tubal ligation    . Mass excision Right 09/26/2012    Procedure: EXCISION MASS;  Surgeon: Kerin Perna, MD;  Location: [facility] OR;  Service:  Vascular;  Laterality: Right;  EXCISION OF RIGHT NECK  MASS  . Abdominal hysterectomy  1988    [facility] - Dr. Christie Beckers    REVIEW OF SYSTEMS:  Constitutional: negative Eyes: negative Ears, nose, mouth, throat, and face: negative Respiratory: negative Cardiovascular: negative Gastrointestinal: negative Genitourinary:negative Integument/breast: negative Hematologic/lymphatic: negative Musculoskeletal:negative Neurological: negative Behavioral/Psych: negative Endocrine: negative Allergic/Immunologic: negative   PHYSICAL EXAMINATION: General appearance: alert, cooperative and no distress Head: Normocephalic, without obvious abnormality, atraumatic Neck: no adenopathy, no JVD, supple, symmetrical, trachea midline and thyroid not enlarged, symmetric, no tenderness/mass/nodules Lymph nodes: Cervical, supraclavicular, and axillary nodes normal. Resp: clear to auscultation bilaterally Back: symmetric, no curvature. ROM normal. No CVA tenderness. Cardio: regular rate and rhythm, S1, S2 normal, no murmur, click, rub or gallop GI: soft, non-tender; bowel sounds normal; no masses,  no organomegaly Extremities: extremities normal, atraumatic,  no cyanosis or edema Neurologic: Alert and oriented X 3, normal strength and tone. Normal symmetric reflexes. Normal coordination and gait  ECOG PERFORMANCE STATUS: 1 - Symptomatic but completely ambulatory  Blood pressure 99/77, pulse 72, temperature 98 F (36.7 C), resp. rate 20, height 5\' 1"  (1.549 m), weight 110 lb 8 oz (50.122 kg), SpO2 97.00%.  LABORATORY DATA: Lab Results  Component Value Date   WBC 13.2* 01/13/2013   HGB 11.1* 01/13/2013   HCT 34.3* 01/13/2013   MCV 91.2 01/13/2013   PLT 376 01/13/2013      Chemistry      Component Value Date/Time   NA 141 01/13/2013 1036   NA 135 01/09/2013 0452   K 4.3 01/13/2013 1036   K 3.8 01/09/2013 0452   CL 101 01/09/2013 0452   CL 102 08/18/2012 0938   CO2 26 01/13/2013 1036   CO2 25  01/09/2013 0452   BUN 18.6 01/13/2013 1036   BUN 11 01/09/2013 0452   CREATININE 0.7 01/13/2013 1036   CREATININE 0.58 01/09/2013 0452      Component Value Date/Time   CALCIUM 9.8 01/13/2013 1036   CALCIUM 9.8 01/09/2013 0452   ALKPHOS 117 01/13/2013 1036   ALKPHOS 103 11/20/2012 0415   AST 13 01/13/2013 1036   AST 15 11/20/2012 0415   ALT 9 01/13/2013 1036   ALT <5 11/20/2012 0415   BILITOT <0.20 01/13/2013 1036   BILITOT 0.2* 11/20/2012 0415       RADIOGRAPHIC STUDIES: No results found.   ASSESSMENT AND PLAN:  This is a very pleasant 62 years old white female with metastatic non-small cell lung cancer, adenocarcinoma currently on treatment with Tarceva 150 mg by mouth daily status post 3 months of treatment. She is currently off Tarceva and is being considered for the immunotherapy with Nivolumab clinical trial. She's had no further seizures. Patient was discussed with also seen by [name]. Currently the patient is not sure she wants to continue with the immunotherapy clinical trial. Dr. Shirline Frees stated that at this point she had the 3 options: #1 chemotherapy with different agents than she had received previously, #2 the immunotherapy clinical trial with Nivolumab and #3 hospice and palliative care. Patient would like to take some time to consider her options and will return in 2 weeks for another symptom management visit and to give [place] her decision.   Melanie Paddack E, PA-C   For smoke cessation, I strongly advise the patient to quit smoking and again offered to smoke cessation program. The patient voices understanding of current disease status and treatment options and is in agreement with the current care plan.  All questions were answered. The patient knows to call the clinic with any problems, questions or concerns. We can certainly see the patient much sooner if necessary.  ADDENDUM: Hematology/Oncology Attending: I had a face to face encounter with the patient. I  recommended her care plan. The patient came to the clinic today accompanied by a family member for followup visit. She recently underwent LITT therapy to the enlarging brain metastasis under the care of Dr. Angelyn Punt for at [facility]. The patient was considered for immunotherapy with Nivolumab on the clinical trial after stabilization of her brain metastasis. She came today for evaluation and discussion of her treatment options. She changes her mind regarding any further treatment. I gave the patient several options for treatment today including enrollment in the clinical trial for Nivolumab versus systemic chemotherapy versus palliative care and hospice referral. She  requested some time to think about her options. She will call the office with her decision and I will arrange a followup appointment based on that. She was advised to call immediately if she has any concerning symptoms in the interval. Lajuana Matte., MD 03/03/2013   .

## 2013-03-03 NOTE — Patient Instructions (Signed)
Follow-up in 2 weeks

## 2013-03-05 ENCOUNTER — Encounter (HOSPITAL_COMMUNITY): Payer: Self-pay | Admitting: Emergency Medicine

## 2013-03-05 ENCOUNTER — Emergency Department (HOSPITAL_COMMUNITY): Payer: Medicaid Other

## 2013-03-05 ENCOUNTER — Inpatient Hospital Stay (HOSPITAL_COMMUNITY)
Admission: EM | Admit: 2013-03-05 | Discharge: 2013-03-06 | DRG: 054 | Disposition: A | Discharge status: Home / Self Care | Payer: Medicaid Other | Attending: Internal Medicine | Admitting: Internal Medicine

## 2013-03-05 DIAGNOSIS — R531 Weakness: Secondary | ICD-10-CM

## 2013-03-05 DIAGNOSIS — Z923 Personal history of irradiation: Secondary | ICD-10-CM

## 2013-03-05 DIAGNOSIS — D649 Anemia, unspecified: Secondary | ICD-10-CM

## 2013-03-05 DIAGNOSIS — Z888 Allergy status to other drugs, medicaments and biological substances status: Secondary | ICD-10-CM

## 2013-03-05 DIAGNOSIS — C3490 Malignant neoplasm of unspecified part of unspecified bronchus or lung: Secondary | ICD-10-CM | POA: Diagnosis present

## 2013-03-05 DIAGNOSIS — F17200 Nicotine dependence, unspecified, uncomplicated: Secondary | ICD-10-CM | POA: Diagnosis present

## 2013-03-05 DIAGNOSIS — R918 Other nonspecific abnormal finding of lung field: Secondary | ICD-10-CM

## 2013-03-05 DIAGNOSIS — J4489 Other specified chronic obstructive pulmonary disease: Secondary | ICD-10-CM | POA: Diagnosis present

## 2013-03-05 DIAGNOSIS — K209 Esophagitis, unspecified without bleeding: Secondary | ICD-10-CM

## 2013-03-05 DIAGNOSIS — IMO0002 Reserved for concepts with insufficient information to code with codable children: Secondary | ICD-10-CM

## 2013-03-05 DIAGNOSIS — D696 Thrombocytopenia, unspecified: Secondary | ICD-10-CM

## 2013-03-05 DIAGNOSIS — I3139 Other pericardial effusion (noninflammatory): Secondary | ICD-10-CM

## 2013-03-05 DIAGNOSIS — R1310 Dysphagia, unspecified: Secondary | ICD-10-CM

## 2013-03-05 DIAGNOSIS — M6281 Muscle weakness (generalized): Secondary | ICD-10-CM

## 2013-03-05 DIAGNOSIS — C7949 Secondary malignant neoplasm of other parts of nervous system: Principal | ICD-10-CM

## 2013-03-05 DIAGNOSIS — Z716 Tobacco abuse counseling: Secondary | ICD-10-CM

## 2013-03-05 DIAGNOSIS — Z881 Allergy status to other antibiotic agents status: Secondary | ICD-10-CM

## 2013-03-05 DIAGNOSIS — G40802 Other epilepsy, not intractable, without status epilepticus: Secondary | ICD-10-CM | POA: Diagnosis present

## 2013-03-05 DIAGNOSIS — K222 Esophageal obstruction: Secondary | ICD-10-CM

## 2013-03-05 DIAGNOSIS — R059 Cough, unspecified: Secondary | ICD-10-CM

## 2013-03-05 DIAGNOSIS — R569 Unspecified convulsions: Secondary | ICD-10-CM

## 2013-03-05 DIAGNOSIS — Z79899 Other long term (current) drug therapy: Secondary | ICD-10-CM

## 2013-03-05 DIAGNOSIS — J449 Chronic obstructive pulmonary disease, unspecified: Secondary | ICD-10-CM

## 2013-03-05 DIAGNOSIS — I313 Pericardial effusion (noninflammatory): Secondary | ICD-10-CM

## 2013-03-05 DIAGNOSIS — C7931 Secondary malignant neoplasm of brain: Principal | ICD-10-CM

## 2013-03-05 DIAGNOSIS — E440 Moderate protein-calorie malnutrition: Secondary | ICD-10-CM

## 2013-03-05 DIAGNOSIS — D638 Anemia in other chronic diseases classified elsewhere: Secondary | ICD-10-CM | POA: Diagnosis present

## 2013-03-05 DIAGNOSIS — Z720 Tobacco use: Secondary | ICD-10-CM

## 2013-03-05 DIAGNOSIS — G936 Cerebral edema: Secondary | ICD-10-CM

## 2013-03-05 DIAGNOSIS — Z66 Do not resuscitate: Secondary | ICD-10-CM | POA: Diagnosis present

## 2013-03-05 DIAGNOSIS — R05 Cough: Secondary | ICD-10-CM

## 2013-03-05 LAB — CBC WITH DIFFERENTIAL/PLATELET
Basophils Absolute: 0 10*3/uL (ref 0.0–0.1)
Basophils Relative: 1 % (ref 0–1)
Eosinophils Absolute: 0.1 10*3/uL (ref 0.0–0.7)
Eosinophils Relative: 2 % (ref 0–5)
HCT: 35.3 % — ABNORMAL LOW (ref 36.0–46.0)
Hemoglobin: 11.4 g/dL — ABNORMAL LOW (ref 12.0–15.0)
LYMPHS ABS: 0.9 10*3/uL (ref 0.7–4.0)
LYMPHS PCT: 13 % (ref 12–46)
MCH: 30.7 pg (ref 26.0–34.0)
MCHC: 32.3 g/dL (ref 30.0–36.0)
MCV: 95.1 fL (ref 78.0–100.0)
Monocytes Absolute: 0.7 10*3/uL (ref 0.1–1.0)
Monocytes Relative: 11 % (ref 3–12)
NEUTROS ABS: 4.8 10*3/uL (ref 1.7–7.7)
Neutrophils Relative %: 74 % (ref 43–77)
Platelets: 304 10*3/uL (ref 150–400)
RBC: 3.71 MIL/uL — AB (ref 3.87–5.11)
RDW: 14.7 % (ref 11.5–15.5)
WBC: 6.5 10*3/uL (ref 4.0–10.5)

## 2013-03-05 LAB — COMPREHENSIVE METABOLIC PANEL
ALT: 6 U/L (ref 0–35)
AST: 9 U/L (ref 0–37)
Albumin: 3 g/dL — ABNORMAL LOW (ref 3.5–5.2)
Alkaline Phosphatase: 85 U/L (ref 39–117)
BILIRUBIN TOTAL: 0.2 mg/dL — AB (ref 0.3–1.2)
BUN: 11 mg/dL (ref 6–23)
CHLORIDE: 102 meq/L (ref 96–112)
CO2: 28 meq/L (ref 19–32)
CREATININE: 0.7 mg/dL (ref 0.50–1.10)
Calcium: 9.6 mg/dL (ref 8.4–10.5)
Glucose, Bld: 81 mg/dL (ref 70–99)
Potassium: 3.9 mEq/L (ref 3.7–5.3)
Sodium: 141 mEq/L (ref 137–147)
Total Protein: 6.4 g/dL (ref 6.0–8.3)

## 2013-03-05 LAB — GLUCOSE, CAPILLARY: Glucose-Capillary: 76 mg/dL (ref 70–99)

## 2013-03-05 MED ORDER — SUCRALFATE 1 G PO TABS
1.0000 g | ORAL_TABLET | Freq: Every day | ORAL | Status: DC
  Administered 2013-03-06: 1 g via ORAL
  Filled 2013-03-05 (×2): qty 1

## 2013-03-05 MED ORDER — SODIUM CHLORIDE 0.9 % IV SOLN
INTRAVENOUS | Status: DC
  Administered 2013-03-05: 14:00:00 via INTRAVENOUS

## 2013-03-05 MED ORDER — NICOTINE 21 MG/24HR TD PT24
21.0000 mg | MEDICATED_PATCH | Freq: Every day | TRANSDERMAL | Status: DC
  Administered 2013-03-05 – 2013-03-06 (×2): 21 mg via TRANSDERMAL
  Filled 2013-03-05 (×2): qty 1

## 2013-03-05 MED ORDER — SODIUM CHLORIDE 0.9 % IJ SOLN: 3.0000 mL | INTRAMUSCULAR | Status: DC | PRN

## 2013-03-05 MED ORDER — SODIUM CHLORIDE 0.9 % IV SOLN: 250.0000 mL | INTRAVENOUS | Status: DC | PRN

## 2013-03-05 MED ORDER — FAMOTIDINE 20 MG PO TABS
20.0000 mg | ORAL_TABLET | Freq: Every day | ORAL | Status: DC
  Administered 2013-03-05 – 2013-03-06 (×2): 20 mg via ORAL
  Filled 2013-03-05 (×2): qty 1

## 2013-03-05 MED ORDER — PROMETHAZINE HCL 25 MG PO TABS: 25.0000 mg | ORAL_TABLET | Freq: Four times a day (QID) | ORAL | Status: DC | PRN

## 2013-03-05 MED ORDER — ALBUTEROL SULFATE (5 MG/ML) 0.5% IN NEBU: 2.5000 mg | INHALATION_SOLUTION | Freq: Four times a day (QID) | RESPIRATORY_TRACT | Status: DC | PRN

## 2013-03-05 MED ORDER — ACETAMINOPHEN 325 MG PO TABS
650.0000 mg | ORAL_TABLET | Freq: Four times a day (QID) | ORAL | Status: DC | PRN
  Administered 2013-03-05: 650 mg via ORAL
  Filled 2013-03-05: qty 2

## 2013-03-05 MED ORDER — LORAZEPAM 2 MG/ML IJ SOLN
1.0000 mg | Freq: Once | INTRAMUSCULAR | Status: AC
  Administered 2013-03-05: 1 mg via INTRAVENOUS
  Filled 2013-03-05: qty 1

## 2013-03-05 MED ORDER — PROCHLORPERAZINE MALEATE 10 MG PO TABS: 10.0000 mg | ORAL_TABLET | Freq: Four times a day (QID) | ORAL | Status: DC | PRN

## 2013-03-05 MED ORDER — ACETAMINOPHEN 650 MG RE SUPP: 650.0000 mg | Freq: Four times a day (QID) | RECTAL | Status: DC | PRN

## 2013-03-05 MED ORDER — LEVETIRACETAM 500 MG PO TABS
1000.0000 mg | ORAL_TABLET | Freq: Two times a day (BID) | ORAL | Status: DC
  Administered 2013-03-05 – 2013-03-06 (×2): 1000 mg via ORAL
  Filled 2013-03-05 (×3): qty 2

## 2013-03-05 MED ORDER — DEXAMETHASONE SODIUM PHOSPHATE 4 MG/ML IJ SOLN
4.0000 mg | Freq: Once | INTRAMUSCULAR | Status: AC
  Administered 2013-03-05: 4 mg via INTRAVENOUS
  Filled 2013-03-05: qty 1

## 2013-03-05 MED ORDER — LORAZEPAM 2 MG/ML IJ SOLN: 1.0000 mg | INTRAMUSCULAR | Status: DC | PRN

## 2013-03-05 MED ORDER — DEXAMETHASONE SODIUM PHOSPHATE 4 MG/ML IJ SOLN
4.0000 mg | Freq: Three times a day (TID) | INTRAMUSCULAR | Status: DC
  Administered 2013-03-05 – 2013-03-06 (×2): 4 mg via INTRAVENOUS
  Filled 2013-03-05 (×5): qty 1

## 2013-03-05 MED ORDER — PENTOXIFYLLINE ER 400 MG PO TBCR
400.0000 mg | EXTENDED_RELEASE_TABLET | Freq: Two times a day (BID) | ORAL | Status: DC
  Administered 2013-03-05 – 2013-03-06 (×2): 400 mg via ORAL
  Filled 2013-03-05 (×4): qty 1

## 2013-03-05 MED ORDER — VITAMIN E 180 MG (400 UNIT) PO CAPS
400.0000 [IU] | ORAL_CAPSULE | Freq: Two times a day (BID) | ORAL | Status: DC
  Administered 2013-03-05 – 2013-03-06 (×2): 400 [IU] via ORAL
  Filled 2013-03-05 (×3): qty 1

## 2013-03-05 MED ORDER — SODIUM CHLORIDE 0.9 % IV SOLN
INTRAVENOUS | Status: DC
  Administered 2013-03-05: 75 mL/h via INTRAVENOUS

## 2013-03-05 MED ORDER — IPRATROPIUM BROMIDE 0.02 % IN SOLN: 500.0000 ug | RESPIRATORY_TRACT | Status: DC | PRN

## 2013-03-05 MED ORDER — POLYETHYLENE GLYCOL 3350 17 G PO PACK
17.0000 g | PACK | Freq: Every day | ORAL | Status: DC | PRN
Start: 2013-03-05 — End: 2013-03-06
  Filled 2013-03-05: qty 1

## 2013-03-05 MED ORDER — OXYCODONE-ACETAMINOPHEN 5-325 MG PO TABS: 1.0000 | ORAL_TABLET | ORAL | Status: DC | PRN

## 2013-03-05 MED ORDER — SODIUM CHLORIDE 0.9 % IJ SOLN
3.0000 mL | Freq: Two times a day (BID) | INTRAMUSCULAR | Status: DC
  Administered 2013-03-05: 3 mL via INTRAVENOUS

## 2013-03-05 NOTE — ED Provider Notes (Signed)
CSN: [id number]     Arrival date & time 03/05/13  1044 History   First MD Initiated Contact with Patient 03/05/13 1053     Chief Complaint  Patient presents with  . Seizures   (Consider location/radiation/quality/duration/timing/severity/associated sxs/prior Treatment) The history is provided by the patient and the EMS personnel.   63 year old female followed by hematology oncology here and also followed at [facility]. Patient with lung CA with known brain metastases. Patient was admitted November 6 for seizure activity. It was found to be related to brain metastases. Patient had some hemorrhage and cerebral edema. Patient underwent a procedure at [facility] to control the bleeding after being admitted here. Cerebral edema it was felt to be due to necrotic tumor swelling and radiation treatment.  Patient with a seizure today it occurred this morning was typical for seizure she does take Keppra for seizures. Patient without further seizure activity however has her prodrome of the left arm feeling heavy this usually occurs before the seizures happen. Patient denies any headache or fevers  Past Medical History  Diagnosis Date  . Nodule of right lung     right upper lobe  . Anemia   . Rectovaginal fistula   . Right frontal lobe lesion 12/13/11    CT head  . History of radiation therapy 12/24/11-02/08/12    rul 66Gy/60fxs  . Dysphagia   . History of radiation therapy 12/27/11    SRS rt ant frontal 20gy  . Lung cancer 11/23/11    biopsy-right  . Brain metastases   . Chest pain at rest 08/26/2012  . COPD (chronic obstructive pulmonary disease)   . Shortness of breath    Past Surgical History  Procedure Laterality Date  . Partial hysterectomy    . Hemmorhoid surgery    . Neck surgery    . Retinal detachment surgery    . Esophagogastroduodenoscopy N/A 05/01/2012    Procedure: ESOPHAGOGASTRODUODENOSCOPY (EGD);  Surgeon: Lear Ng, MD;  Location: [facility];  Service: Endoscopy;   Laterality: N/A;  . Subxyphoid pericardial window N/A 08/28/2012    Procedure: SUBXYPHOID PERICARDIAL WINDOW;  Surgeon: Ivin Poot, MD;  Location: [facility] OR;  Service: Thoracic;  Laterality: N/A;  . Tee without cardioversion  08/28/2012    Procedure: TRANSESOPHAGEAL ECHOCARDIOGRAM (TEE);  Surgeon: Ivin Poot, MD;  Location: [facility];  Service: Thoracic;;  . Eye surgery    . Tubal ligation    . Mass excision Right 09/26/2012    Procedure: EXCISION MASS;  Surgeon: Ivin Poot, MD;  Location: [facility] OR;  Service: Vascular;  Laterality: Right;  EXCISION OF RIGHT NECK  MASS  . Abdominal hysterectomy  1988    [facility] - Dr. Daivd Council   Family History  Problem Relation Age of Onset  . Heart disease Father   . Kidney disease Mother   . Alzheimer's disease Sister   . Alzheimer's disease Brother    History  Substance Use Topics  . Smoking status: Current Every Day Smoker -- 0.50 packs/day for 43 years    Types: Cigarettes  . Smokeless tobacco: Never Used  . Alcohol Use: No   OB History   Grav Para Term Preterm Abortions TAB SAB Ect Mult Living                 Review of Systems  Constitutional: Negative for fever.  HENT: Positive for congestion.   Eyes: Negative for redness.  Respiratory: Negative for shortness of breath.   Gastrointestinal: Negative for abdominal pain.  Musculoskeletal: Negative for myalgias.  Skin: Negative for rash.  Neurological: Positive for tremors, seizures and weakness. Negative for headaches.  Psychiatric/Behavioral: Negative for confusion.    Allergies  Carboplatin and Levaquin  Home Medications   Current Outpatient Rx  Name  Route  Sig  Dispense  Refill  . albuterol (PROVENTIL) (5 MG/ML) 0.5% nebulizer solution   Nebulization   Take 2.5 mg by nebulization every 6 (six) hours as needed for wheezing.         [name] ipratropium (ATROVENT) 0.02 % nebulizer solution   Nebulization   Take 500 mcg by nebulization every 4 (four) hours as needed for  wheezing or shortness of breath.          . levETIRAcetam (KEPPRA) 1000 MG tablet   Oral   Take 1 tablet (1,000 mg total) by mouth 2 (two) times daily.   60 tablet   0   . oxyCODONE-acetaminophen (PERCOCET/ROXICET) 5-325 MG per tablet   Oral   Take 1 tablet by mouth every 4 (four) hours as needed for moderate pain or severe pain.         . pentoxifylline (TRENTAL) 400 MG CR tablet   Oral   Take 1 tablet (400 mg total) by mouth 2 (two) times daily with a meal.   60 tablet   5   . promethazine (PHENERGAN) 25 MG tablet   Oral   Take 1 tablet (25 mg total) by mouth every 6 (six) hours as needed for nausea.   30 tablet   1   . ranitidine (ZANTAC) 150 MG tablet   Oral   Take 150 mg by mouth daily.          . vitamin E (VITAMIN E) 400 UNIT capsule   Oral   Take 1 capsule (400 Units total) by mouth 2 (two) times daily.   60 capsule   5   . dexamethasone (DECADRON) 4 MG tablet   Oral   Take 1 tablet (4 mg total) by mouth as directed. Take half pill (2mg ) twice daily for one week, then half daily (2mg ) once daily for one week then stop.   30 tablet   1   . prochlorperazine (COMPAZINE) 10 MG tablet   Oral   Take 10 mg by mouth every 6 (six) hours as needed (nausea).          . sucralfate (CARAFATE) 1 G tablet   Oral   Take 1 g by mouth daily.          BP 120/73  Pulse 114  Temp(Src) 98.2 F (36.8 C) (Oral)  Resp 19  SpO2 95% Physical Exam  Nursing note and vitals reviewed. Constitutional: She is oriented to person, place, and time. She appears well-developed and well-nourished. No distress.  HENT:  Head: Normocephalic and atraumatic.  Mouth/Throat: Oropharynx is clear and moist.  Eyes: Conjunctivae and EOM are normal. Pupils are equal, round, and reactive to light.  Neck: Normal range of motion.  Cardiovascular: Normal rate, regular rhythm and normal heart sounds.   Pulmonary/Chest: Effort normal and breath sounds normal. No respiratory distress.   Abdominal: Soft. There is no tenderness.  Musculoskeletal: Normal range of motion.  Neurological: She is alert and oriented to person, place, and time. No cranial nerve deficit. She exhibits normal muscle tone. Coordination normal.  Skin: Skin is warm. No rash noted.    ED Course  Procedures (including critical care time) Labs Review Labs Reviewed  COMPREHENSIVE METABOLIC PANEL - Abnormal; Notable  for the following:    Albumin 3.0 (*)    Total Bilirubin 0.2 (*)    All other components within normal limits  CBC WITH DIFFERENTIAL - Abnormal; Notable for the following:    RBC 3.71 (*)    Hemoglobin 11.4 (*)    HCT 35.3 (*)    All other components within normal limits  GLUCOSE, CAPILLARY   Results for orders placed during the hospital encounter of 03/05/13  COMPREHENSIVE METABOLIC PANEL      Result Value Range   Sodium 141  137 - 147 mEq/L   Potassium 3.9  3.7 - 5.3 mEq/L   Chloride 102  96 - 112 mEq/L   CO2 28  19 - 32 mEq/L   Glucose, Bld 81  70 - 99 mg/dL   BUN 11  6 - 23 mg/dL   Creatinine, Ser 0.70  0.50 - 1.10 mg/dL   Calcium 9.6  8.4 - 10.5 mg/dL   Total Protein 6.4  6.0 - 8.3 g/dL   Albumin 3.0 (*) 3.5 - 5.2 g/dL   AST 9  0 - 37 U/L   ALT 6  0 - 35 U/L   Alkaline Phosphatase 85  39 - 117 U/L   Total Bilirubin 0.2 (*) 0.3 - 1.2 mg/dL   GFR calc non Af Amer >90  >90 mL/min   GFR calc Af Amer >90  >90 mL/min  CBC WITH DIFFERENTIAL      Result Value Range   WBC 6.5  4.0 - 10.5 K/uL   RBC 3.71 (*) 3.87 - 5.11 MIL/uL   Hemoglobin 11.4 (*) 12.0 - 15.0 g/dL   HCT 35.3 (*) 36.0 - 46.0 %   MCV 95.1  78.0 - 100.0 fL   MCH 30.7  26.0 - 34.0 pg   MCHC 32.3  30.0 - 36.0 g/dL   RDW 14.7  11.5 - 15.5 %   Platelets 304  150 - 400 K/uL   Neutrophils Relative % 74  43 - 77 %   Neutro Abs 4.8  1.7 - 7.7 K/uL   Lymphocytes Relative 13  12 - 46 %   Lymphs Abs 0.9  0.7 - 4.0 K/uL   Monocytes Relative 11  3 - 12 %   Monocytes Absolute 0.7  0.1 - 1.0 K/uL   Eosinophils Relative  2  0 - 5 %   Eosinophils Absolute 0.1  0.0 - 0.7 K/uL   Basophils Relative 1  0 - 1 %   Basophils Absolute 0.0  0.0 - 0.1 K/uL  GLUCOSE, CAPILLARY      Result Value Range   Glucose-Capillary 76  70 - 99 mg/dL    Imaging Review Ct Head Wo Contrast  03/05/2013   CLINICAL DATA:  Seizures, headaches, history of lung cancer  EXAM: CT HEAD WITHOUT CONTRAST  TECHNIQUE: Contiguous axial images were obtained from the base of the skull through the vertex without intravenous contrast.  COMPARISON:  01/08/2013, MR brain 01/07/2013  FINDINGS: There is a 14 mm right posterior parietal lobe mass with severe surrounding vasogenic edema. There is a 15 mm mass in the right parietal lobes more inferiorly adjacent to the right lateral ventricle with severe surrounding vasogenic edema. The previously demonstrated right frontal lobe mass is not well appreciated on the current exam. The this jetting edema effaces the posterior aspect of the right lateral ventricle. There is no hydrocephalus. There is no intracranial hemorrhage. There is no evidence of cortical based acute infarction. There is 4.3  mm of right-to-left midline shift.  The basal cisterns are patent.  Visualized portions of the orbits are unremarkable. The visualized portions of the paranasal sinuses and mastoid air cells are unremarkable.  The osseous structures are unremarkable.  IMPRESSION: 1. No acute intracranial hemorrhage or cortical infarction. 2. Persistent cerebral metastatic disease with associated severe vasogenic edema similar in appearance to the prior exam of 01/08/2013.   Electronically Signed   By: Kathreen Devoid   On: 03/05/2013 11:34    EKG Interpretation   None       MDM   1. Cerebral edema   2. Lung cancer, unspecified laterality    Today's CT shows increased brain swelling compared to November 6. Discussed with on-call hematology oncology and hospitalist service. Patient will be admitted will be started on 4 mg of Decadron 3 times a  day and Ativan on a regular schedule. Patient with known history of metastatic brain metastases from lung CA. Patient with a history of seizures is on Keppra,  however had a breakthrough seizure today. Patient now feels fine no specific complaints mentating fine no acute distress.  Per the prodrome here patient was given Ativan 1 mg prodrome symptoms resolve from her seizure.  Mervin Kung, MD 03/05/13 [phone]

## 2013-03-05 NOTE — ED Notes (Signed)
Per EMS pt comes from home where she lives alone for possible seizure. Pt states she took her seizure meds this am. Pt c/o left shoulder pain that is usually how her seizures start.  Pt was a&o the entire time per EMS and was a&Ox4 talking with firemen when EMS arrived on scene.  Pt had surgery to stop bleeding vessels in her brain at [facility] on Jan 24, 2013.

## 2013-03-05 NOTE — H&P (Signed)
[facility] History and Physical  Melanie Liu KGM:[id number] DOB: 1950-08-17 DOA: 03/05/2013  Referring physician: Dr. Fredia Sorrow PCP: Sherrie Mustache, MD   Chief Complaint: Seizure   History of Present Illness: Melanie Liu is an 63 y.o. female with a PMH of lung cancer/brain metastasis, under the care of Dr. Julien Nordmann and Dr. Tammi Klippel, status post stereotactic radiosurgery to brain metastasis 12/27/11, 04/14/12 and 08/04/12, status post concurrent chemoradiation followed by Q 3 week Tarceva, recent hospitalization 01/08/13-01/10/13 for evaluation of seizures (further imaging showed multiple brain metastasis), seen at [facility] by Dr. 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  She completed a decadron taper on 02/25/13.  She presents after having a seizure this morning around 9:30 AM (unwitnessed).  No fall or reported injuries.  No loss of bladder or bowel control.  No mouth trauma.   Last seizure prior to this one was 01/10/13.  No missed doses of Keppra.  Seizure lasted about 5 minutes, and was associated with left sided tremors.  No residual symptoms or headache. She was given 1 mg of Ativan in ED and a CT scan of the head shows persistent cerebral metastatic disease with associated severe vasogenic edema similar in appearance to the prior exam of 01/08/2013.    Review of Systems: Constitutional: No fever, no chills;  Appetite normal; No weight loss, + weight gain, no fatigue.  HEENT: No blurry vision, no diplopia, no pharyngitis, no dysphagia CV: No chest pain, no palpitations, no PND.  Resp: No SOB, no cough, no pleuritic pain. GI: No nausea, no vomiting, no diarrhea, no melena, no hematochezia, no constipation.  GU: No dysuria, no hematuria, no frequency, no urgency. MSK: no myalgias, no arthralgias.  Neuro:  No headache, no focal neurological deficits, + history of seizures.  Psych: No depression, no anxiety.  Endo: No heat intolerance,  no cold intolerance, no polyuria, no polydipsia  Skin: No rashes, no skin lesions.  Heme: No easy bruising.  Travel history: None in last 6 months.  Past Medical History Past Medical History  Diagnosis Date  . Nodule of right lung     right upper lobe  . Anemia   . Rectovaginal fistula   . Right frontal lobe lesion 12/13/11    CT head  . History of radiation therapy 12/24/11-02/08/12    rul 66Gy/32fxs  . Dysphagia   . History of radiation therapy 12/27/11    SRS rt ant frontal 20gy  . Lung cancer 11/23/11    biopsy-right  . Brain metastases   . Chest pain at rest 08/26/2012  . COPD (chronic obstructive pulmonary disease)   . Shortness of breath      Past Surgical History Past Surgical History  Procedure Laterality Date  . Partial hysterectomy    . Hemmorhoid surgery    . Neck surgery    . Retinal detachment surgery    . Esophagogastroduodenoscopy N/A 05/01/2012    Procedure: ESOPHAGOGASTRODUODENOSCOPY (EGD);  Surgeon: Lear Ng, MD;  Location: [facility];  Service: Endoscopy;  Laterality: N/A;  . Subxyphoid pericardial window N/A 08/28/2012    Procedure: SUBXYPHOID PERICARDIAL WINDOW;  Surgeon: Ivin Poot, MD;  Location: [facility] OR;  Service: Thoracic;  Laterality: N/A;  . Tee without cardioversion  08/28/2012    Procedure: TRANSESOPHAGEAL ECHOCARDIOGRAM (TEE);  Surgeon: Ivin Poot, MD;  Location: [facility];  Service: Thoracic;;  . Eye surgery    . Tubal ligation    . Mass excision Right  09/26/2012    Procedure: EXCISION MASS;  Surgeon: Ivin Poot, MD;  Location: [facility] OR;  Service: Vascular;  Laterality: Right;  EXCISION OF RIGHT NECK  MASS  . Abdominal hysterectomy  1988    [facility] - Dr. Daivd Council     Social History: History   Social History  . Marital Status: Single    Spouse Name: N/A    Number of Children: 2  . Years of Education: N/A   Occupational History  . Disabled; Prior CNA/med-tach.    Social History Main Topics  . Smoking status:  Current Every Day Smoker -- 0.50 packs/day for 43 years    Types: Cigarettes  . Smokeless tobacco: Never Used  . Alcohol Use: No  . Drug Use: No  . Sexual Activity: No   Other Topics Concern  . Not on file   Social History Narrative   Single.  Lives alone.  Ambulates independently.     Family History:  Family History  Problem Relation Age of Onset  . Heart disease Father   . Kidney disease Mother   . Alzheimer's disease Sister   . Alzheimer's disease Brother     Allergies: Carboplatin and Levaquin  Meds: Prior to Admission medications   Medication Sig Start Date End Date Taking? Authorizing Provider  albuterol (PROVENTIL) (5 MG/ML) 0.5% nebulizer solution Take 2.5 mg by nebulization every 6 (six) hours as needed for wheezing.   Yes Historical Provider, MD  ipratropium (ATROVENT) 0.02 % nebulizer solution Take 500 mcg by nebulization every 4 (four) hours as needed for wheezing or shortness of breath.    Yes Historical Provider, MD  levETIRAcetam (KEPPRA) 1000 MG tablet Take 1 tablet (1,000 mg total) by mouth 2 (two) times daily. 01/13/13  Yes Carlton Adam, PA-C  oxyCODONE-acetaminophen (PERCOCET/ROXICET) 5-325 MG per tablet Take 1 tablet by mouth every 4 (four) hours as needed for moderate pain or severe pain.   Yes Historical Provider, MD  pentoxifylline (TRENTAL) 400 MG CR tablet Take 1 tablet (400 mg total) by mouth 2 (two) times daily with a meal. 10/29/12  Yes Lora Paula, MD  promethazine (PHENERGAN) 25 MG tablet Take 1 tablet (25 mg total) by mouth every 6 (six) hours as needed for nausea. 01/13/13  Yes Adrena E Johnson, PA-C  ranitidine (ZANTAC) 150 MG tablet Take 150 mg by mouth daily.  02/06/13 03/08/13 Yes Historical Provider, MD  vitamin E (VITAMIN E) 400 UNIT capsule Take 1 capsule (400 Units total) by mouth 2 (two) times daily. 10/29/12  Yes Lora Paula, MD  dexamethasone (DECADRON) 4 MG tablet Take 1 tablet (4 mg total) by mouth as directed. Take half  pill (2mg ) twice daily for one week, then half daily (2mg ) once daily for one week then stop. 02/12/13   Lora Paula, MD  prochlorperazine (COMPAZINE) 10 MG tablet Take 10 mg by mouth every 6 (six) hours as needed (nausea).     Historical Provider, MD  sucralfate (CARAFATE) 1 G tablet Take 1 g by mouth daily.    Historical Provider, MD    Physical Exam: Filed Vitals:   03/05/13 1052 03/05/13 1505  BP: 120/73 112/65  Pulse: 114 123  Temp: 98.2 F (36.8 C)   TempSrc: Oral   Resp: 19 25  SpO2: 95% 95%     Physical Exam: Blood pressure 112/65, pulse 123, temperature 98.2 F (36.8 C), temperature source Oral, resp. rate 25, SpO2 95.00%. Gen: No acute distress. Head: Normocephalic, atraumatic. Eyes: PERRL,  EOMI, sclerae nonicteric. Mouth: Oropharynx clear.  Moist mucous membranes.   Neck: Supple, no thyromegaly, no lymphadenopathy, no jugular venous distention. Chest: Lungs with course expiratory wheezes and scattered rhonchi CV: Heart sounds are tachycardic.  No murmurs, rubs or gallops. Abdomen: Soft, nontender, nondistended with normal active bowel sounds. Extremities: Extremities are without C/E/C. Skin: Warm and dry. Neuro: Alert and oriented times 3; cranial nerves II through XII grossly intact.  MAEx4 with equal strength. Psych: Mood and affect normal.  Labs on Admission:  Basic Metabolic Panel:  Recent Labs Lab 03/05/13 1128  NA 141  K 3.9  CL 102  CO2 28  GLUCOSE 81  BUN 11  CREATININE 0.70  CALCIUM 9.6   Liver Function Tests:  Recent Labs Lab 03/05/13 1128  AST 9  ALT 6  ALKPHOS 85  BILITOT 0.2*  PROT 6.4  ALBUMIN 3.0*   CBC:  Recent Labs Lab 03/05/13 1128  WBC 6.5  NEUTROABS 4.8  HGB 11.4*  HCT 35.3*  MCV 95.1  PLT 304    BNP (last 3 results)  Recent Labs  08/27/12 0200  PROBNP 635.4*   CBG:  Recent Labs Lab 03/05/13 1128  GLUCAP 76    Radiological Exams on Admission: Ct Head Wo Contrast  03/05/2013   CLINICAL DATA:   Seizures, headaches, history of lung cancer  EXAM: CT HEAD WITHOUT CONTRAST  TECHNIQUE: Contiguous axial images were obtained from the base of the skull through the vertex without intravenous contrast.  COMPARISON:  01/08/2013, MR brain 01/07/2013  FINDINGS: There is a 14 mm right posterior parietal lobe mass with severe surrounding vasogenic edema. There is a 15 mm mass in the right parietal lobes more inferiorly adjacent to the right lateral ventricle with severe surrounding vasogenic edema. The previously demonstrated right frontal lobe mass is not well appreciated on the current exam. The this jetting edema effaces the posterior aspect of the right lateral ventricle. There is no hydrocephalus. There is no intracranial hemorrhage. There is no evidence of cortical based acute infarction. There is 4.3 mm of right-to-left midline shift.  The basal cisterns are patent.  Visualized portions of the orbits are unremarkable. The visualized portions of the paranasal sinuses and mastoid air cells are unremarkable.  The osseous structures are unremarkable.  IMPRESSION: 1. No acute intracranial hemorrhage or cortical infarction. 2. Persistent cerebral metastatic disease with associated severe vasogenic edema similar in appearance to the prior exam of 01/08/2013.   Electronically Signed   By: Kathreen Devoid   On: 03/05/2013 11:34    Assessment/Plan Principal Problem:   Seizure secondary to cerebral edema from lung cancer with brain metastasis Oncologist consulted.  Decadron 4 mg IV Q 8 hours.  Ativan PRN breakthrough seizures.  Continue Keppra. Active Problems:   COPD (chronic obstructive pulmonary disease) Continue bronchodilators PRN.   Tobacco abuse Counsel.  Nicotine patch.     Moderate protein-calorie malnutrition Monitor nutritional intake.  Code Status: DNR Family Communication: Melanie Liu (sister): [phone]. Disposition Plan: Home when stable.  Time spent: 1 hour.  RAMA,CHRISTINA [facility] Pager [phone]  If 7PM-7AM, please contact night-coverage [email or web address] Password [email or web address] 03/05/2013, 3:10 PM

## 2013-03-05 NOTE — Consult Note (Signed)
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 female is here because of recurrent seizure. The patient was well known to have brain metastasis. He was previously treated in stereotactic fashion by Dr. Tammi Klippel and most recently at [facility]. Summary of our oncology history is as follows PRIOR THERAPY:  1) Status post stereotactic radiosurgery to the single brain lesion under the care Dr. Tammi Klippel.  2) Concurrent chemoradiation with chemotherapy in the form of weekly carboplatin for an AUC of 2 and paclitaxel at 45 mg per meter squared concurrent with radiation therapy under the care Dr. Tammi Klippel with partial response in her disease.  3) Systemic chemotherapy with carboplatin for AUC of 5 and Alimta 500 mg/M2 every 3 weeks. From cycle 3 forward she is receiving carboplatin for an AUC of 4 and Alimta at 375 mg per meter squared due to thrombocytopenia. Status post a total of 6 cycles with evidence for disease progression.  4) Subxiphoid pericardial window for drainage of pericardial effusion under the care of Dr. Prescott Gum on 08/28/2012 and the final pathology showed no malignant cells in the pleural fluid.  5) Tarceva 150 mg by mouth daily. Therapy started 09/30/2012. Status post approximately 3 months of therapy.  6) stereotactic biopsy followed by MRI- Thermometry guided laser interstitial Thermal therapy (LITT therapy) at [facility] under the care of Dr. Angelene Giovanni on 01/25/2013.  The patient was last seen in the outpatient clinic to several days ago for consideration for clinical  trial. She is currently taking anti-seizure medications. This morning at home, she had sensation of aura and started to have choking movement on the left side of her body. According to the ER physician, by the time she was transported to the hospital, she had very mild postictal confusion. At the time of interview, her dental status has returned to baseline. She denies biting her tongue or had urinary or bowel incontinence. Denies any permanent neurological deficit. She denies any changes in her vision or speech. She drank some liquid earlier with no choking sensation no dysphagia. Her appetite is stable, and he denies any recent weight loss. She had mild nonproductive cough at home no recent hemoptysis.  MEDICAL HISTORY:  Past Medical History  Diagnosis Date  . Nodule of right lung     right upper lobe  . Anemia   . Rectovaginal fistula   . Right frontal lobe lesion 12/13/11    CT head  . History of radiation therapy 12/24/11-02/08/12    rul 66Gy/16fxs  . Dysphagia   . History of radiation therapy 12/27/11    SRS rt ant frontal 20gy  . Lung cancer 11/23/11    biopsy-right  . Brain metastases   . Chest pain at rest 08/26/2012  . COPD (chronic obstructive pulmonary disease)   . Shortness of breath     SURGICAL HISTORY: Past Surgical History  Procedure Laterality Date  . Partial hysterectomy    . Hemmorhoid surgery    . Neck surgery    . Retinal detachment surgery    . Esophagogastroduodenoscopy N/A 05/01/2012    Procedure: ESOPHAGOGASTRODUODENOSCOPY (EGD);  Surgeon: Lear Ng, MD;  Location: [facility];  Service: Endoscopy;  Laterality: N/A;  . Subxyphoid pericardial window N/A 08/28/2012    Procedure: SUBXYPHOID PERICARDIAL WINDOW;  Surgeon: Ivin Poot, MD;  Location: [facility] OR;  Service: Thoracic;  Laterality: N/A;  . Tee without cardioversion  08/28/2012    Procedure: TRANSESOPHAGEAL ECHOCARDIOGRAM (TEE);  Surgeon: Ivin Poot, MD;  Location: [place];  Service:  Thoracic;;  . Eye surgery    . Tubal ligation    . Mass excision Right 09/26/2012    Procedure: EXCISION MASS;  Surgeon: Ivin Poot, MD;  Location: [facility] OR;  Service: Vascular;  Laterality: Right;  EXCISION OF RIGHT NECK  MASS  . Abdominal hysterectomy  1988    [facility] - Dr. Daivd Council    SOCIAL HISTORY: History   Social History  . Marital Status: Single    Spouse Name: N/A    Number of Children: 2  . Years of Education: N/A   Occupational History  . Disabled; Prior CNA/med-tach.    Social History Main Topics  . Smoking status: Current Every Day Smoker -- 0.50 packs/day for 43 years    Types: Cigarettes  . Smokeless tobacco: Never Used  . Alcohol Use: No  . Drug Use: No  . Sexual Activity: No   Other Topics Concern  . Not on file   Social History Narrative   Single.  Lives alone.  Ambulates independently.     FAMILY HISTORY: Family History  Problem Relation Age of Onset  . Heart disease Father   . Kidney disease Mother   . Alzheimer's disease Sister   . Alzheimer's disease Brother     ALLERGIES:  is allergic to carboplatin and levaquin.  MEDICATIONS:  Current Facility-Administered Medications  Medication Dose Route Frequency Provider Last Rate Last Dose  . [START ON 03/06/2013] 0.9 %  sodium chloride infusion   Intravenous Continuous Mervin Kung, MD 75 mL/hr at 03/05/13 1133 75 mL/hr at 03/05/13 1133  . 0.9 %  sodium chloride infusion   Intravenous STAT Mervin Kung, MD      . dexamethasone (DECADRON) injection 4 mg  4 mg Intravenous Once Mervin Kung, MD       Current Outpatient Prescriptions  Medication Sig Dispense Refill  . albuterol (PROVENTIL) (5 MG/ML) 0.5% nebulizer solution Take 2.5 mg by nebulization every 6 (six) hours as needed for wheezing.      [name] ipratropium (ATROVENT) 0.02 % nebulizer solution Take 500 mcg by nebulization every 4 (four) hours as needed for wheezing or shortness of breath.       . levETIRAcetam (KEPPRA) 1000  MG tablet Take 1 tablet (1,000 mg total) by mouth 2 (two) times daily.  60 tablet  0  . oxyCODONE-acetaminophen (PERCOCET/ROXICET) 5-325 MG per tablet Take 1 tablet by mouth every 4 (four) hours as needed for moderate pain or severe pain.      . pentoxifylline (TRENTAL) 400 MG CR tablet Take 1 tablet (400 mg total) by mouth 2 (two) times daily with a meal.  60 tablet  5  . promethazine (PHENERGAN) 25 MG tablet Take 1 tablet (25 mg total) by mouth every 6 (six) hours as needed for nausea.  30 tablet  1  . ranitidine (ZANTAC) 150 MG tablet Take 150 mg by mouth daily.       . vitamin E (VITAMIN E) 400 UNIT capsule Take 1 capsule (400 Units total) by mouth 2 (two) times daily.  60 capsule  5  . dexamethasone (DECADRON) 4 MG tablet Take  1 tablet (4 mg total) by mouth as directed. Take half pill (2mg ) twice daily for one week, then half daily (2mg ) once daily for one week then stop.  30 tablet  1  . prochlorperazine (COMPAZINE) 10 MG tablet Take 10 mg by mouth every 6 (six) hours as needed (nausea).       . sucralfate (CARAFATE) 1 G tablet Take 1 g by mouth daily.        REVIEW OF SYSTEMS:   Constitutional: Denies fevers, chills or abnormal night sweats Eyes: Denies blurriness of vision, double vision or watery eyes Ears, nose, mouth, throat, and face: Denies mucositis or sore throat Cardiovascular: Denies palpitation, chest discomfort or lower extremity swelling Gastrointestinal:  Denies nausea, heartburn or change in bowel habits Skin: Denies abnormal skin rashes Lymphatics: Denies new lymphadenopathy or easy bruising Behavioral/Psych: Mood is stable, no new changes  All other systems were reviewed with the patient and are negative.  PHYSICAL EXAMINATION: ECOG PERFORMANCE STATUS: 1 - Symptomatic but completely ambulatory  Filed Vitals:   03/05/13 1052  BP: 120/73  Pulse: 114  Temp: 98.2 F (36.8 C)  Resp: 19   There were no vitals filed for this visit.  GENERAL:alert, no distress and  comfortable SKIN: skin color, texture, turgor are normal, no rashes or significant lesions EYES: normal, conjunctiva are pink and non-injected, sclera clear OROPHARYNX:no exudate, no erythema and lips, buccal mucosa, and tongue normal  NECK: supple, thyroid normal size, non-tender, without nodularity LYMPH:  no palpable lymphadenopathy in the cervical, axillary or inguinal LUNGS: clear to auscultation and percussion with normal breathing effort HEART: regular rate & rhythm and no murmurs and no lower extremity edema ABDOMEN:abdomen soft, non-tender and normal bowel sounds Musculoskeletal:no cyanosis of digits and no clubbing  PSYCH: alert & oriented x 3 with fluent speech NEURO: no focal motor/sensory deficits  LABORATORY DATA:  I have reviewed the data as listed Lab Results  Component Value Date   WBC 6.5 03/05/2013   HGB 11.4* 03/05/2013   HCT 35.3* 03/05/2013   MCV 95.1 03/05/2013   PLT 304 03/05/2013   Lab Results  Component Value Date   NA 141 03/05/2013   K 3.9 03/05/2013   CL 102 03/05/2013   CO2 28 03/05/2013    RADIOGRAPHIC STUDIES: I reviewed the imaging study myself I have personally reviewed the radiological images as listed and agreed with the findings in the report. Ct Head Wo Contrast  03/05/2013   CLINICAL DATA:  Seizures, headaches, history of lung cancer  EXAM: CT HEAD WITHOUT CONTRAST  TECHNIQUE: Contiguous axial images were obtained from the base of the skull through the vertex without intravenous contrast.  COMPARISON:  01/08/2013, MR brain 01/07/2013  FINDINGS: There is a 14 mm right posterior parietal lobe mass with severe surrounding vasogenic edema. There is a 15 mm mass in the right parietal lobes more inferiorly adjacent to the right lateral ventricle with severe surrounding vasogenic edema. The previously demonstrated right frontal lobe mass is not well appreciated on the current exam. The this jetting edema effaces the posterior aspect of the right lateral ventricle. There  is no hydrocephalus. There is no intracranial hemorrhage. There is no evidence of cortical based acute infarction. There is 4.3 mm of right-to-left midline shift.  The basal cisterns are patent.  Visualized portions of the orbits are unremarkable. The visualized portions of the paranasal sinuses and mastoid air cells are unremarkable.  The osseous structures are unremarkable.  IMPRESSION: 1. No acute intracranial hemorrhage  or cortical infarction. 2. Persistent cerebral metastatic disease with associated severe vasogenic edema similar in appearance to the prior exam of 01/08/2013.   Electronically Signed   By: Kathreen Devoid   On: 03/05/2013 11:34    ASSESSMENT:  Recurrent seizures secondary to brain metastasis  PLAN:  #1 recurrent seizures This is likely due to her brain metastasis. I recommend continue anti-seizure medications and to start her on intravenous dexamethasone. The patient was successfully taper off dexamethasone about a week ago. I recommend 4 mg intravenous 3 times a day I recommend admission so that we can monitor her closely #2 metastatic lung cancer I will inform her medical oncologist about her admission. It is not clear to me whether she would still be a candidate for clinical trial in view of recurrent seizure. I would like to defer to Dr. Julien Nordmann to make treatment recommendation regarding her systemic disease. In the meantime we'll continue supportive care #3 tobacco use According to the patient she does not smoke anymore but is using electronic cigarettes. #4 mild anemia This is likely anemia of chronic disease. The patient denies recent history of bleeding such as epistaxis, hematuria or hematochezia. She is asymptomatic from the anemia. We will observe for now.  She does not require transfusion now.     All questions were answered.     Amilah Greenspan, MD @T @ 2:54 PM

## 2013-03-05 NOTE — ED Notes (Signed)
Bed: RJ73 Expected date:  Expected time:  Means of arrival:  Comments: EMS--possible seizure

## 2013-03-05 NOTE — ED Notes (Signed)
Pt now c/o left arm feels heavy and her entire left side "feels numb, like when i have a seizure". Pt states that she sometimes has muscle jerks on her left side.

## 2013-03-05 NOTE — ED Notes (Signed)
Patient ate subway, chips, and had drink. Patient has no complaints of nausea or any distress. RN Freda Munro aware.

## 2013-03-06 DIAGNOSIS — R569 Unspecified convulsions: Secondary | ICD-10-CM

## 2013-03-06 DIAGNOSIS — E440 Moderate protein-calorie malnutrition: Secondary | ICD-10-CM

## 2013-03-06 DIAGNOSIS — C3410 Malignant neoplasm of upper lobe, unspecified bronchus or lung: Secondary | ICD-10-CM

## 2013-03-06 DIAGNOSIS — C7931 Secondary malignant neoplasm of brain: Principal | ICD-10-CM

## 2013-03-06 DIAGNOSIS — C7949 Secondary malignant neoplasm of other parts of nervous system: Principal | ICD-10-CM

## 2013-03-06 MED ORDER — DEXAMETHASONE 4 MG PO TABS: 4.0000 mg | ORAL_TABLET | Freq: Two times a day (BID) | ORAL | Status: DC

## 2013-03-06 MED ORDER — OXYCODONE-ACETAMINOPHEN 5-325 MG PO TABS: 1.0000 | ORAL_TABLET | ORAL | Status: AC | PRN

## 2013-03-06 MED ORDER — LORAZEPAM 1 MG PO TABS: 1.0000 mg | ORAL_TABLET | Freq: Three times a day (TID) | ORAL | Status: AC | PRN

## 2013-03-06 NOTE — Progress Notes (Signed)
Subjective: The patient is seen and examined today. She is feeling much better today with no significant complaints. She was admitted yesterday with seizure lasted for around 5 minutes. She was restarted on Decadron 4 mg every 8 hours. The patient also continued on her seizure medication with Keppra. She denied having any fever or chills. She has no nausea or vomiting. The patient denied having any chest pain or shortness of breath. She has no seizure activity since her admission.  Objective: Vital signs in last 24 hours: Temp:  [97.7 F (36.5 C)-98.5 F (36.9 C)] 97.7 F (36.5 C) (01/02 0533) Pulse Rate:  [101-123] 111 (01/02 0533) Resp:  [20-25] 20 (01/02 0533) BP: (96-112)/(58-67) 104/67 mmHg (01/02 0533) SpO2:  [95 %-97 %] 97 % (01/02 0533) Weight:  [110 lb (49.896 kg)] 110 lb (49.896 kg) (01/01 1623)  Intake/Output from previous day: 01/01 0701 - 01/02 0700 In: -  Out: 750 [Urine:750] Intake/Output this shift:    General appearance: alert, cooperative and no distress Resp: clear to auscultation bilaterally Cardio: regular rate and rhythm, S1, S2 normal, no murmur, click, rub or gallop GI: soft, non-tender; bowel sounds normal; no masses,  no organomegaly Extremities: extremities normal, atraumatic, no cyanosis or edema  Lab Results:   Recent Labs  03/05/13 1128  WBC 6.5  HGB 11.4*  HCT 35.3*  PLT 304   BMET  Recent Labs  03/05/13 1128  NA 141  K 3.9  CL 102  CO2 28  GLUCOSE 81  BUN 11  CREATININE 0.70  CALCIUM 9.6    Studies/Results: Ct Head Wo Contrast  03/05/2013   CLINICAL DATA:  Seizures, headaches, history of lung cancer  EXAM: CT HEAD WITHOUT CONTRAST  TECHNIQUE: Contiguous axial images were obtained from the base of the skull through the vertex without intravenous contrast.  COMPARISON:  01/08/2013, MR brain 01/07/2013  FINDINGS: There is a 14 mm right posterior parietal lobe mass with severe surrounding vasogenic edema. There is a 15 mm mass in the  right parietal lobes more inferiorly adjacent to the right lateral ventricle with severe surrounding vasogenic edema. The previously demonstrated right frontal lobe mass is not well appreciated on the current exam. The this jetting edema effaces the posterior aspect of the right lateral ventricle. There is no hydrocephalus. There is no intracranial hemorrhage. There is no evidence of cortical based acute infarction. There is 4.3 mm of right-to-left midline shift.  The basal cisterns are patent.  Visualized portions of the orbits are unremarkable. The visualized portions of the paranasal sinuses and mastoid air cells are unremarkable.  The osseous structures are unremarkable.  IMPRESSION: 1. No acute intracranial hemorrhage or cortical infarction. 2. Persistent cerebral metastatic disease with associated severe vasogenic edema similar in appearance to the prior exam of 01/08/2013.   Electronically Signed   By: Kathreen Devoid   On: 03/05/2013 11:34    Medications: I have reviewed the patient's current medications.   Assessment/Plan: 1) metastatic non-small cell lung cancer with recurrent brain metastasis status post stereotactic radiotherapy and recent AUTO LITT at [facility] for necrotic lesions. I have a lengthy discussion with the patient today about her treatment options for the metastatic non-small cell lung cancer and the patient is now interested in considering some form of chemotherapy in the near future after her discharge from the hospital. 2) seizure activity: Secondary to metastatic brain lesions. Continue Keppra and Decadron. Thank you so much for taking good care of Melanie Liu, I will continue to  follow the patient with you and assist in her management an as-needed basis.  LOS: 1 day    Cherly Erno K. 03/06/2013

## 2013-03-06 NOTE — Discharge Summary (Signed)
Physician Discharge Summary  Melanie Liu IYM:[id number] DOB: [birth year] DOA: 03/05/2013  PCP: Sherrie Mustache, MD  Admit date: 03/05/2013 Discharge date: 03/06/2013  Recommendations for Outpatient Follow-up:  1. The amount of brain swelling is about the same but it would be beneficial to continue steroids so we have prescribed decadron 4 mg BID (twice a day) 2. Please follow up with oncology and neuro per scheduled appt as we have discussed 3. Prescription provided for sublingual ativan if needed for seizures not controlled with Keppra.  Discharge Diagnoses:  Principal Problem:   Seizure Active Problems:   COPD (chronic obstructive pulmonary disease)   Tobacco abuse   Lung cancer with brain metastasis   Cerebral edema   Moderate protein-calorie malnutrition    Discharge Condition: medically stable for discharge home today and pt insists on going home today  Diet recommendation: as tolerated   History of present illness:  63 y.o. female with a PMH of lung cancer/brain metastasis, under the care of Dr. Julien Nordmann and Dr. Tammi Klippel, status post stereotactic radiosurgery to brain metastasis 12/27/11, 04/14/12 and 08/04/12, status post concurrent chemoradiation followed by Q 3 week Tarceva, recent hospitalization 01/08/13-01/10/13 for evaluation of seizures (further imaging showed multiple brain metastasis), seen at [facility] by Dr. 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 She completed a decadron taper on 02/25/13. She presents after having a seizure on the morning of this admission. No fall or reported injuries. No loss of bladder or bowel control. No mouth trauma. Last seizure prior to this one was 01/10/13. No missed doses of Keppra. Seizure lasted about 5 minutes, and was associated with left sided tremors. No residual symptoms or headache. She was given 1 mg of Ativan in ED and a CT scan of the head shows persistent cerebral metastatic disease  with associated severe vasogenic edema similar in appearance to the prior exam of 01/08/2013.   Assessment/Plan   Principal Problem:  Seizure secondary to cerebral edema from lung cancer with brain metastasis  - Oncologist consulted. Management in hospital: Decadron 4 mg IV Q 8 hours. Ativan PRN breakthrough seizures. Continue Keppra.  - on discharge will continue decadron 4 mg BID with sublingual ativan PRN for seizures; pt says she has prodromal symptoms prior to seizures and feels if she has something at that time it may control seizures; also continue Keppra Active Problems:  COPD (chronic obstructive pulmonary disease)  - Continue bronchodilators PRN.  Tobacco abuse  - Counsel. Nicotine patch.  Moderate protein-calorie malnutrition  - Monitor nutritional intake.   Code Status: DNR  Family Communication: Riki Sheer (sister): [phone].  Disposition Plan: Home today  Signed:  Leisa Lenz, MD  [facility] 03/06/2013, 12:30 PM  Pager #: [phone]  Procedures:  None   Consultations:  Oncology (Dr. Julien Nordmann)  Discharge Exam: Filed Vitals:   03/06/13 0533  BP: 104/67  Pulse: 111  Temp: 97.7 F (36.5 C)  Resp: 20   Filed Vitals:   03/05/13 1505 03/05/13 1623 03/05/13 2033 03/06/13 0533  BP: 112/65  96/58 104/67  Pulse: 123 101 114 111  Temp:  98.5 F (36.9 C) 98.1 F (36.7 C) 97.7 F (36.5 C)  TempSrc:  Oral Oral Oral  Resp: 25 20 20 20   Height:  5\' 1"  (1.549 m)    Weight:  49.896 kg (110 lb)    SpO2: 95% 95% 96% 97%    General: Pt is alert, follows commands appropriately, not in acute distress Cardiovascular: Regular rate and  rhythm, S1/S2 +, no murmurs, no rubs, no gallops Respiratory: Clear to auscultation bilaterally, no wheezing, no crackles, no rhonchi Abdominal: Soft, non tender, non distended, bowel sounds +, no guarding Extremities: no edema, no cyanosis, pulses palpable bilaterally DP and PT Neuro: Grossly nonfocal  Discharge  Instructions  Discharge Orders   Future Appointments Provider Department Dept Phone   03/18/2013 9:00 AM Curt Bears, MD [place] [phone]   Future Orders Complete By Expires   Call MD for:  difficulty breathing, headache or visual disturbances  As directed    Call MD for:  persistant dizziness or light-headedness  As directed    Call MD for:  persistant nausea and vomiting  As directed    Call MD for:  severe uncontrolled pain  As directed    Diet - low sodium heart healthy  As directed    Discharge instructions  As directed    Comments:     1. The amount of brain swelling is about the same but it would be beneficial to continue steroids so we have prescribed decadron 4 mg BID (twice a day) 2. Please follow up with oncology and neuro per scheduled appt as we have discussed 3. Prescription provided for sublingual ativan if needed for seizures not controlled with Keppra.   Increase activity slowly  As directed        Medication List         albuterol (5 MG/ML) 0.5% nebulizer solution  Commonly known as:  PROVENTIL  Take 2.5 mg by nebulization every 6 (six) hours as needed for wheezing.     CARAFATE 1 G tablet  Generic drug:  sucralfate  Take 1 g by mouth daily.     dexamethasone 4 MG tablet  Take 1 tablet (4 mg total) by mouth 2 (two) times daily.     ipratropium 0.02 % nebulizer solution  Commonly known as:  ATROVENT  Take 500 mcg by nebulization every 4 (four) hours as needed for wheezing or shortness of breath.     levETIRAcetam 1000 MG tablet  Commonly known as:  KEPPRA  Take 1 tablet (1,000 mg total) by mouth 2 (two) times daily.     LORazepam 1 MG tablet  Commonly known as:  ATIVAN  Place 1 tablet (1 mg total) under the tongue every 8 (eight) hours as needed for anxiety or seizure.     oxyCODONE-acetaminophen 5-325 MG per tablet  Commonly known as:  PERCOCET/ROXICET  Take 1 tablet by mouth every 4 (four) hours as needed for  moderate pain or severe pain.     pentoxifylline 400 MG CR tablet  Commonly known as:  TRENTAL  Take 1 tablet (400 mg total) by mouth 2 (two) times daily with a meal.     prochlorperazine 10 MG tablet  Commonly known as:  COMPAZINE  Take 10 mg by mouth every 6 (six) hours as needed (nausea).     promethazine 25 MG tablet  Commonly known as:  PHENERGAN  Take 1 tablet (25 mg total) by mouth every 6 (six) hours as needed for nausea.     ranitidine 150 MG tablet  Commonly known as:  ZANTAC  Take 150 mg by mouth daily.     vitamin E 400 UNIT capsule  Commonly known as:  vitamin E  Take 1 capsule (400 Units total) by mouth 2 (two) times daily.           Follow-up Information   Follow up with  Sherrie Mustache, MD. Schedule an appointment as soon as possible for a visit in 2 weeks.   Specialty:  Family Medicine   Contact information:   [place] [address] [phone]        The results of significant diagnostics from this hospitalization (including imaging, microbiology, ancillary and laboratory) are listed below for reference.    Significant Diagnostic Studies: Ct Head Wo Contrast  03/05/2013   CLINICAL DATA:  Seizures, headaches, history of lung cancer  EXAM: CT HEAD WITHOUT CONTRAST  TECHNIQUE: Contiguous axial images were obtained from the base of the skull through the vertex without intravenous contrast.  COMPARISON:  01/08/2013, MR brain 01/07/2013  FINDINGS: There is a 14 mm right posterior parietal lobe mass with severe surrounding vasogenic edema. There is a 15 mm mass in the right parietal lobes more inferiorly adjacent to the right lateral ventricle with severe surrounding vasogenic edema. The previously demonstrated right frontal lobe mass is not well appreciated on the current exam. The this jetting edema effaces the posterior aspect of the right lateral ventricle. There is no hydrocephalus. There is no intracranial hemorrhage. There is no evidence  of cortical based acute infarction. There is 4.3 mm of right-to-left midline shift.  The basal cisterns are patent.  Visualized portions of the orbits are unremarkable. The visualized portions of the paranasal sinuses and mastoid air cells are unremarkable.  The osseous structures are unremarkable.  IMPRESSION: 1. No acute intracranial hemorrhage or cortical infarction. 2. Persistent cerebral metastatic disease with associated severe vasogenic edema similar in appearance to the prior exam of 01/08/2013.   Electronically Signed   By: Kathreen Devoid   On: 03/05/2013 11:34    Microbiology: No results found for this or any previous visit (from the past 240 hour(s)).   Labs: Basic Metabolic Panel:  Recent Labs Lab 03/05/13 1128  NA 141  K 3.9  CL 102  CO2 28  GLUCOSE 81  BUN 11  CREATININE 0.70  CALCIUM 9.6   Liver Function Tests:  Recent Labs Lab 03/05/13 1128  AST 9  ALT 6  ALKPHOS 85  BILITOT 0.2*  PROT 6.4  ALBUMIN 3.0*   No results found for this basename: LIPASE, AMYLASE,  in the last 168 hours No results found for this basename: AMMONIA,  in the last 168 hours CBC:  Recent Labs Lab 03/05/13 1128  WBC 6.5  NEUTROABS 4.8  HGB 11.4*  HCT 35.3*  MCV 95.1  PLT 304   Cardiac Enzymes: No results found for this basename: CKTOTAL, CKMB, CKMBINDEX, TROPONINI,  in the last 168 hours BNP: BNP (last 3 results)  Recent Labs  08/27/12 0200  PROBNP 635.4*   CBG:  Recent Labs Lab 03/05/13 1128  GLUCAP 76    Time coordinating discharge: Over 30 minutes

## 2013-03-06 NOTE — Discharge Instructions (Signed)
Seizure, Adult A seizure is abnormal electrical activity in the brain. Seizures can cause a change in attention or behavior (altered mental status). Seizures often involve uncontrollable shaking (convulsions). Seizures usually last from 30 seconds to 2 minutes. Epilepsy is a brain disorder in which a patient has repeated seizures over time. CAUSES  There are many different problems that can cause seizures. In some cases, no cause is found. Common causes of seizures include:  Head injuries.  Brain tumors.  Infections.  Imbalance of chemicals in the blood.  Kidney failure or liver failure.  Heart disease.  Drug abuse.  Stroke.  Withdrawal from certain drugs or alcohol.  Birth defects.  Malfunction of a neurosurgical device placed in the brain. SYMPTOMS  Symptoms vary depending on the part of the brain that is involved. Right before a seizure, you may have a warning (aura) that a seizure is about to occur. An aura may include the following symptoms:   Fear or anxiety.  Nausea.  Feeling like the room is spinning (vertigo).  Vision changes, such as seeing flashing lights or spots. Common symptoms during a seizure include:  Convulsions.  Drooling.  Rapid eye movements.  Grunting.  Loss of bladder and bowel control.  Bitter taste in the mouth. After a seizure, you may feel confused and sleepy. You may also have an injury resulting from convulsions during the seizure. DIAGNOSIS  Your caregiver will perform a physical exam and run some tests to determine the type and cause of your seizure. These tests may include:  Blood tests.  A lumbar puncture test. In this test, a small amount of fluid is removed from the spine and examined.  Electrocardiography (ECG). This test records the electrical activity in your heart.  Imaging tests, such as computed tomography (CT) scans or magnetic resonance imaging (MRI).  Electroencephalography (EEG). This test records the electrical  activity in your brain. TREATMENT  Seizures usually stop on their own. Treatment will depend on the cause of your seizure. In some cases, medicine may be given to prevent future seizures. HOME CARE INSTRUCTIONS   If you are given medicines, take them exactly as prescribed by your caregiver.  Keep all follow-up appointments as directed by your caregiver.  Do not swim or drive until your caregiver says it is okay.  Teach friends and family what to do if you have a seizure. They should:  Lay you on the ground to prevent a fall.  Put a cushion under your head.  Loosen any tight clothing around your neck.  Turn you on your side. If vomiting occurs, this helps keep your airway clear.  Stay with you until you recover. SEEK IMMEDIATE MEDICAL CARE IF:  The seizure lasts longer than 2 to 5 minutes.  The seizure is severe or the person does not wake up after the seizure.  The person has altered mental status. Drive the person to the emergency department or call your local emergency services (911 in [place] MAKE SURE YOU:  Understand these instructions.  Will watch your condition.  Will get help right away if you are not doing well or get worse. Document Released: 02/17/2000 Document Revised: 05/14/2011 Document Reviewed: 02/07/2011 [facility] Patient Information 2014 [place], [place].

## 2013-03-18 ENCOUNTER — Ambulatory Visit: Payer: Self-pay | Admitting: Internal Medicine

## 2013-03-18 ENCOUNTER — Emergency Department (HOSPITAL_COMMUNITY): Payer: Medicaid Other

## 2013-03-18 ENCOUNTER — Encounter (HOSPITAL_COMMUNITY): Payer: Self-pay | Admitting: Emergency Medicine

## 2013-03-18 ENCOUNTER — Other Ambulatory Visit: Payer: Self-pay | Admitting: Internal Medicine

## 2013-03-18 ENCOUNTER — Inpatient Hospital Stay (HOSPITAL_COMMUNITY)
Admission: EM | Admit: 2013-03-18 | Discharge: 2013-03-20 | DRG: 054 | Disposition: A | Discharge status: Home / Self Care | Payer: Medicaid Other | Attending: Internal Medicine | Admitting: Internal Medicine

## 2013-03-18 DIAGNOSIS — C3490 Malignant neoplasm of unspecified part of unspecified bronchus or lung: Secondary | ICD-10-CM

## 2013-03-18 DIAGNOSIS — J449 Chronic obstructive pulmonary disease, unspecified: Secondary | ICD-10-CM | POA: Diagnosis present

## 2013-03-18 DIAGNOSIS — F17200 Nicotine dependence, unspecified, uncomplicated: Secondary | ICD-10-CM | POA: Diagnosis present

## 2013-03-18 DIAGNOSIS — Z9981 Dependence on supplemental oxygen: Secondary | ICD-10-CM

## 2013-03-18 DIAGNOSIS — Z85118 Personal history of other malignant neoplasm of bronchus and lung: Secondary | ICD-10-CM

## 2013-03-18 DIAGNOSIS — Z66 Do not resuscitate: Secondary | ICD-10-CM | POA: Diagnosis present

## 2013-03-18 DIAGNOSIS — J4489 Other specified chronic obstructive pulmonary disease: Secondary | ICD-10-CM | POA: Diagnosis present

## 2013-03-18 DIAGNOSIS — Z515 Encounter for palliative care: Secondary | ICD-10-CM

## 2013-03-18 DIAGNOSIS — C7949 Secondary malignant neoplasm of other parts of nervous system: Principal | ICD-10-CM

## 2013-03-18 DIAGNOSIS — IMO0002 Reserved for concepts with insufficient information to code with codable children: Secondary | ICD-10-CM

## 2013-03-18 DIAGNOSIS — R29898 Other symptoms and signs involving the musculoskeletal system: Secondary | ICD-10-CM | POA: Diagnosis present

## 2013-03-18 DIAGNOSIS — R569 Unspecified convulsions: Secondary | ICD-10-CM

## 2013-03-18 DIAGNOSIS — Z9221 Personal history of antineoplastic chemotherapy: Secondary | ICD-10-CM

## 2013-03-18 DIAGNOSIS — Z923 Personal history of irradiation: Secondary | ICD-10-CM

## 2013-03-18 DIAGNOSIS — G936 Cerebral edema: Secondary | ICD-10-CM

## 2013-03-18 DIAGNOSIS — Z820 Family history of epilepsy and other diseases of the nervous system: Secondary | ICD-10-CM

## 2013-03-18 DIAGNOSIS — E440 Moderate protein-calorie malnutrition: Secondary | ICD-10-CM

## 2013-03-18 DIAGNOSIS — R531 Weakness: Secondary | ICD-10-CM | POA: Diagnosis present

## 2013-03-18 DIAGNOSIS — Z720 Tobacco use: Secondary | ICD-10-CM | POA: Diagnosis present

## 2013-03-18 DIAGNOSIS — Z8249 Family history of ischemic heart disease and other diseases of the circulatory system: Secondary | ICD-10-CM

## 2013-03-18 DIAGNOSIS — C7931 Secondary malignant neoplasm of brain: Principal | ICD-10-CM

## 2013-03-18 LAB — CBC
HCT: 36.2 % (ref 36.0–46.0)
HEMOGLOBIN: 11.6 g/dL — AB (ref 12.0–15.0)
MCH: 30.5 pg (ref 26.0–34.0)
MCHC: 32 g/dL (ref 30.0–36.0)
MCV: 95.3 fL (ref 78.0–100.0)
PLATELETS: 296 10*3/uL (ref 150–400)
RBC: 3.8 MIL/uL — ABNORMAL LOW (ref 3.87–5.11)
RDW: 15.4 % (ref 11.5–15.5)
WBC: 13.6 10*3/uL — ABNORMAL HIGH (ref 4.0–10.5)

## 2013-03-18 LAB — BASIC METABOLIC PANEL
BUN: 12 mg/dL (ref 6–23)
CO2: 26 mEq/L (ref 19–32)
CREATININE: 0.64 mg/dL (ref 0.50–1.10)
Calcium: 9.5 mg/dL (ref 8.4–10.5)
Chloride: 102 mEq/L (ref 96–112)
Glucose, Bld: 92 mg/dL (ref 70–99)
Potassium: 3.5 mEq/L — ABNORMAL LOW (ref 3.7–5.3)
Sodium: 142 mEq/L (ref 137–147)

## 2013-03-18 MED ORDER — SODIUM CHLORIDE 0.9 % IJ SOLN
3.0000 mL | Freq: Two times a day (BID) | INTRAMUSCULAR | Status: DC
Start: 2013-03-18 — End: 2013-03-20
  Administered 2013-03-18 – 2013-03-20 (×4): 3 mL via INTRAVENOUS

## 2013-03-18 MED ORDER — DEXAMETHASONE SODIUM PHOSPHATE 4 MG/ML IJ SOLN
4.0000 mg | Freq: Four times a day (QID) | INTRAMUSCULAR | Status: DC
  Administered 2013-03-18 – 2013-03-20 (×7): 4 mg via INTRAVENOUS
  Filled 2013-03-18 (×10): qty 1

## 2013-03-18 MED ORDER — MORPHINE SULFATE 4 MG/ML IJ SOLN
4.0000 mg | INTRAMUSCULAR | Status: DC | PRN
  Administered 2013-03-18: 2 mg via INTRAVENOUS
  Filled 2013-03-18: qty 1

## 2013-03-18 MED ORDER — LORAZEPAM 2 MG/ML IJ SOLN
0.5000 mg | Freq: Once | INTRAMUSCULAR | Status: AC
  Administered 2013-03-18: 0.5 mg via INTRAVENOUS
  Filled 2013-03-18: qty 1

## 2013-03-18 MED ORDER — DEXAMETHASONE SODIUM PHOSPHATE 10 MG/ML IJ SOLN
10.0000 mg | Freq: Once | INTRAMUSCULAR | Status: AC
  Administered 2013-03-18: 10 mg via INTRAVENOUS
  Filled 2013-03-18: qty 1

## 2013-03-18 MED ORDER — DEXAMETHASONE SODIUM PHOSPHATE 4 MG/ML IJ SOLN: 4.0000 mg | Freq: Four times a day (QID) | INTRAMUSCULAR | Status: DC

## 2013-03-18 MED ORDER — ONDANSETRON HCL 4 MG/2ML IJ SOLN
4.0000 mg | Freq: Once | INTRAMUSCULAR | Status: AC
  Administered 2013-03-18: 4 mg via INTRAVENOUS
  Filled 2013-03-18: qty 2

## 2013-03-18 MED ORDER — NICOTINE 14 MG/24HR TD PT24
14.0000 mg | MEDICATED_PATCH | Freq: Every day | TRANSDERMAL | Status: DC
  Filled 2013-03-18 (×2): qty 1

## 2013-03-18 NOTE — ED Notes (Signed)
As per EMS, pt sts she had a focal seziure that lasted 30 min. Pt sts she has headache that has got worse.

## 2013-03-18 NOTE — ED Notes (Signed)
Pt returned  From CT

## 2013-03-18 NOTE — H&P (Signed)
History and Physical   Melanie Liu:[id number] DOB: 1950/11/20 DOA: 03/18/2013  Referring physician: Dr. Jeneen Rinks PCP: Sherrie Mustache, MD  Specialists: Oncology  Chief Complaint: seizure  HPI: Melanie Liu is a 63 y.o. female has a past medical history significant for lung cancer with brain metastasis, recent hospitalization discharged a week ago comes in with seizure like activity today, left arm involuntary movement and worsening left sided weakness. Her involuntary movement lasted about 25 min. No LOC, fall, urinary incontinence reported. She has known brain metastasis with vasogenic edema and was d/c home with tapering decadron taper. She endorses headache and mild nausea, denies vomiting. Denies fever and chills, denies abdominal pain or diarrhea.   Review of Systems: as per HPI otherwise negative  Past Medical History  Diagnosis Date  . Nodule of right lung     right upper lobe  . Anemia   . Rectovaginal fistula   . Right frontal lobe lesion 12/13/11    CT head  . History of radiation therapy 12/24/11-02/08/12    rul 66Gy/87fs  . Dysphagia   . History of radiation therapy 12/27/11    SRS rt ant frontal 20gy  . Lung cancer 11/23/11    biopsy-right  . Brain metastases   . Chest pain at rest 08/26/2012  . COPD (chronic obstructive pulmonary disease)   . Shortness of breath    Past Surgical History  Procedure Laterality Date  . Partial hysterectomy    . Hemmorhoid surgery    . Neck surgery    . Retinal detachment surgery    . Esophagogastroduodenoscopy N/A 05/01/2012    Procedure: ESOPHAGOGASTRODUODENOSCOPY (EGD);  Surgeon: VLear Ng MD;  [facility];  Service: Endoscopy;  Laterality: N/A;  . Subxyphoid pericardial window N/A 08/28/2012    Procedure: SUBXYPHOID PERICARDIAL WINDOW;  Surgeon: PIvin Poot MD;  [facility];  Service: Thoracic;  Laterality: N/A;  . Tee without cardioversion  08/28/2012    Procedure: TRANSESOPHAGEAL  ECHOCARDIOGRAM (TEE);  Surgeon: PIvin Poot MD;  [facility]  Service: Thoracic;;  . Eye surgery    . Tubal ligation    . Mass excision Right 09/26/2012    Procedure: EXCISION MASS;  Surgeon: PIvin Poot MD;  [facility];  Service: Vascular;  Laterality: Right;  EXCISION OF RIGHT NECK  MASS  . [facility]  [facility]  Social History:  reports that she has been smoking Cigarettes.  She has a 21.5 pack-year smoking history. She has never used smokeless tobacco. She reports that she does not drink alcohol or use illicit drugs.  Allergies  Allergen Reactions  . Carboplatin Shortness Of Breath  . Levaquin [Levofloxacin In D5w] Rash    itching    Family History  Problem Relation Age of Onset  . Heart disease Father   . Kidney disease Mother   . Alzheimer's disease Sister   . Alzheimer's disease Brother    Prior to Admission medications   Medication Sig Start Date End Date Taking? Authorizing Provider  albuterol (PROVENTIL) (5 MG/ML) 0.5% nebulizer solution Take 2.5 mg by nebulization every 6 (six) hours as needed for wheezing.   Yes Historical Provider, MD  dexamethasone (DECADRON) 4 MG tablet Take 1 tablet (4 mg total) by mouth 2 (two) times daily. Take half pill (210m twice daily for one week, then half daily (1m25monce daily for one week then stop. 03/06/13  Yes AlmRobbie Lis  MD  ipratropium (ATROVENT) 0.02 % nebulizer solution Take 500 mcg by nebulization every 4 (four) hours as needed for wheezing or shortness of breath.    Yes Historical Provider, MD  levETIRAcetam (KEPPRA) 1000 MG tablet Take 1 tablet (1,000 mg total) by mouth 2 (two) times daily. 01/13/13  Yes Adrena E Johnson, PA-C  LORazepam (ATIVAN) 1 MG tablet Place 1 tablet (1 mg total) under the tongue every 8 (eight) hours as needed for anxiety or seizure. 03/06/13  Yes Robbie Lis, MD  oxyCODONE-acetaminophen (PERCOCET/ROXICET) 5-325 MG per tablet Take 1 tablet by mouth  every 4 (four) hours as needed for moderate pain or severe pain. 03/06/13  Yes Robbie Lis, MD  pentoxifylline (TRENTAL) 400 MG CR tablet Take 1 tablet (400 mg total) by mouth 2 (two) times daily with a meal. 10/29/12  Yes Lora Paula, MD  prochlorperazine (COMPAZINE) 10 MG tablet Take 10 mg by mouth every 6 (six) hours as needed (nausea).    Yes Historical Provider, MD  promethazine (PHENERGAN) 25 MG tablet Take 1 tablet (25 mg total) by mouth every 6 (six) hours as needed for nausea. 01/13/13  Yes Adrena E Johnson, PA-C  ranitidine (ZANTAC) 150 MG capsule Take 150 mg by mouth at bedtime.   Yes Historical Provider, MD  vitamin E (VITAMIN E) 400 UNIT capsule Take 1 capsule (400 Units total) by mouth 2 (two) times daily. 10/29/12  Yes Lora Paula, MD  ranitidine (ZANTAC) 150 MG tablet Take 150 mg by mouth daily.  02/06/13 03/08/13  Historical Provider, MD   Physical Exam: Filed Vitals:   03/18/13 1655 03/18/13 1730 03/18/13 1747 03/18/13 1800  BP:  110/72  104/75  Pulse: 99 99 100 101  Temp:      TempSrc:      Resp:      Height:      Weight:      SpO2: 93% 93% 93% 90%     General:  No apparent distress  Eyes: PERRL, EOMI, no scleral icterus  ENT: moist oropharynx  Neck: supple, no JVD  Cardiovascular: regular rate without MRG; 2+ peripheral pulses  Respiratory: CTA biL, good air movement without wheezing, rhonchi or crackled  Abdomen: soft, non tender to palpation, positive bowel sounds, no guarding, no rebound  Skin: no rashes  Musculoskeletal: no peripheral edema  Psychiatric: normal mood and affect  Neurologic: CN 2-12 grossly intact, strength 5-/5 on left  Labs on Admission:  Basic Metabolic Panel:  Recent Labs Lab 03/18/13 1605  NA 142  K 3.5*  CL 102  CO2 26  GLUCOSE 92  BUN 12  CREATININE 0.64  CALCIUM 9.5   BNP (last 3 results)  Recent Labs  08/27/12 0200  PROBNP 635.4*   Radiological Exams on Admission: Ct Head Wo Contrast  03/18/2013    CLINICAL DATA:  Known lung carcinoma ; currently with headache and vertigo  EXAM: CT HEAD WITHOUT CONTRAST  TECHNIQUE: Contiguous axial images were obtained from the base of the skull through the vertex without intravenous contrast.  COMPARISON:  Brain MRI January 07, 2013 and brain CT March 05, 2012  FINDINGS: There remains extensive vasogenic edema throughout the right posterior superior temporal lobe, superior right occipital lobe, and portions of the posterior right frontal and right parietal lobes. There is again noted a focal mass in this area consistent with known of lung carcinoma metastasis measuring 2.0 x 1.7 cm. There is currently midline shift to the left of 1 cm.  No new mass is seen. There is no hemorrhage. There is no subdural or epidural fluid. There is vasogenic edema in the medial frontal lobe slightly inferior to the lateral ventricles. The a mass consistent with metastatic focus is seen on MR in this region, and the degree of edema in this area remain stable compared to 2 weeks prior. No new edema is appreciable compared to 2 weeks prior. Note that there is a focal calcification in the anterior right parietal lobe which is stable.  Bony calvarium appears intact.  The mastoid air cells are clear.  IMPRESSION: Stable vasogenic edema on the right from known metastases. There is midline shift toward the right which has increased compared to 2 weeks prior. There is no acute infarct or acute hemorrhage. No new metastases identified on this noncontrast enhanced study.   Electronically Signed   By: Lowella Grip M.D.   On: 03/18/2013 16:33   Assessment/Plan Principal Problem:   Seizure Active Problems:   Brain Mets - 17 mm Rt Frontal s/p SRS, then Lt Frontal 5 mm and Rt Parietal 18 mm mets s/p SRS, now with new 5 mm Rt Parietal met   COPD (chronic obstructive pulmonary disease)   Tobacco abuse   Left-sided weakness   Lung cancer with brain metastasis   Cerebral edema   Moderate  protein-calorie malnutrition  Seizure  - due to CT findings, worsening cerebral edema due to brain mets. IV decadron 10 mg followed by 4 mg every 6 hours. Worsening midline shift as noted above.  - case discussed with [place] and NSY on call, continue decadron and neurochecks.  - Q4h neuro checks overnight.  - seizure precautions.  - continue home AED, increase keppra overnight. - Dr. Julien Nordmann added as consultant COPD - continue home medications Tobacco abuse - nicotine patch Moderate protein calorie malnutrition - nutrition consult.   Diet: regular Fluids: none DVT Prophylaxis: SCD  Code Status: DNR  Family Communication: none  Disposition Plan: inpatient  Time spent: 17  Baker Moronta M. Cruzita Lederer, MD [facility] Pager [phone]  If 7PM-7AM, please contact night-coverage [email or web address] Password [email or web address] 03/18/2013, 6:33 PM

## 2013-03-18 NOTE — Progress Notes (Signed)
Utilization Review completed.  Camilla Skeen RN CM  

## 2013-03-18 NOTE — ED Provider Notes (Signed)
CSN: [id number]     Arrival date & time 03/18/13  1443 History   First MD Initiated Contact with Patient 03/18/13 1511     Chief Complaint  Patient presents with  . Headache    HPI  Patient presents after a left upper extremity seizure. She has a history of lung cancer with known brain processes. Status post radiation therapy, stereotactic radiation treatment. Has known cerebral edema from multiple metastases. Last seizure was earlier this month. She tapered down and off of Decadron by Christmas. Presented here on 1/1 with a focal seizure. Has been tapered off of Decadron again. Was on 4 mg 3 times a day, now is on 2 mg once daily. Today she had a prodrome that she was going to have a seizure. She describes as tingling in her left arm. She went to take her Ativan but started having the seizure. She maintained consciousness. She did not secondarily generalized. She called her sister. Her sister called 69. She was transferred here. She complains of a headache. She has had progressive headache for the last 2 days. She's recovered full use of her arm. No Todd's paresis. No right-sided symptoms. No residual symptoms now. Mild nausea. Moderate headache.  Past Medical History  Diagnosis Date  . Nodule of right lung     right upper lobe  . Anemia   . Rectovaginal fistula   . Right frontal lobe lesion 12/13/11    CT head  . History of radiation therapy 12/24/11-02/08/12    rul 66Gy/41fxs  . Dysphagia   . History of radiation therapy 12/27/11    SRS rt ant frontal 20gy  . Lung cancer 11/23/11    biopsy-right  . Brain metastases   . Chest pain at rest 08/26/2012  . COPD (chronic obstructive pulmonary disease)   . Shortness of breath    Past Surgical History  Procedure Laterality Date  . Partial hysterectomy    . Hemmorhoid surgery    . Neck surgery    . Retinal detachment surgery    . Esophagogastroduodenoscopy N/A 05/01/2012    Procedure: ESOPHAGOGASTRODUODENOSCOPY (EGD);  Surgeon: Lear Ng, MD;  Location: [facility];  Service: Endoscopy;  Laterality: N/A;  . Subxyphoid pericardial window N/A 08/28/2012    Procedure: SUBXYPHOID PERICARDIAL WINDOW;  Surgeon: Ivin Poot, MD;  Location: [facility] OR;  Service: Thoracic;  Laterality: N/A;  . Tee without cardioversion  08/28/2012    Procedure: TRANSESOPHAGEAL ECHOCARDIOGRAM (TEE);  Surgeon: Ivin Poot, MD;  Location: [place];  Service: Thoracic;;  . Eye surgery    . Tubal ligation    . Mass excision Right 09/26/2012    Procedure: EXCISION MASS;  Surgeon: Ivin Poot, MD;  Location: [facility] OR;  Service: Vascular;  Laterality: Right;  EXCISION OF RIGHT NECK  MASS  . Abdominal hysterectomy  1988    [facility] - Dr. Daivd Council   Family History  Problem Relation Age of Onset  . Heart disease Father   . Kidney disease Mother   . Alzheimer's disease Sister   . Alzheimer's disease Brother    History  Substance Use Topics  . Smoking status: Current Every Day Smoker -- 0.50 packs/day for 43 years    Types: Cigarettes  . Smokeless tobacco: Never Used  . Alcohol Use: No   OB History   Grav Para Term Preterm Abortions TAB SAB Ect Mult Living                 Review of  Systems  Constitutional: Negative for fever, chills, diaphoresis, appetite change and fatigue.  HENT: Negative for mouth sores, sore throat and trouble swallowing.   Eyes: Negative for visual disturbance.  Respiratory: Negative for cough, chest tightness, shortness of breath and wheezing.   Cardiovascular: Negative for chest pain.  Gastrointestinal: Negative for nausea, vomiting, abdominal pain, diarrhea and abdominal distention.  Endocrine: Negative for polydipsia, polyphagia and polyuria.  Genitourinary: Negative for dysuria, frequency and hematuria.  Musculoskeletal: Negative for gait problem.  Skin: Negative for color change, pallor and rash.  Neurological: Positive for seizures, weakness and headaches. Negative for dizziness, syncope and  light-headedness.  Hematological: Does not bruise/bleed easily.  Psychiatric/Behavioral: Negative for behavioral problems and confusion.    Allergies  Carboplatin and Levaquin  Home Medications   Current Outpatient Rx  Name  Route  Sig  Dispense  Refill  . albuterol (PROVENTIL) (5 MG/ML) 0.5% nebulizer solution   Nebulization   Take 2.5 mg by nebulization every 6 (six) hours as needed for wheezing.         [name] dexamethasone (DECADRON) 4 MG tablet   Oral   Take 1 tablet (4 mg total) by mouth 2 (two) times daily. Take half pill (2mg ) twice daily for one week, then half daily (2mg ) once daily for one week then stop.   60 tablet   1   . ipratropium (ATROVENT) 0.02 % nebulizer solution   Nebulization   Take 500 mcg by nebulization every 4 (four) hours as needed for wheezing or shortness of breath.          . levETIRAcetam (KEPPRA) 1000 MG tablet   Oral   Take 1 tablet (1,000 mg total) by mouth 2 (two) times daily.   60 tablet   0   . LORazepam (ATIVAN) 1 MG tablet   Sublingual   Place 1 tablet (1 mg total) under the tongue every 8 (eight) hours as needed for anxiety or seizure.   30 tablet   0   . oxyCODONE-acetaminophen (PERCOCET/ROXICET) 5-325 MG per tablet   Oral   Take 1 tablet by mouth every 4 (four) hours as needed for moderate pain or severe pain.   45 tablet   0   . pentoxifylline (TRENTAL) 400 MG CR tablet   Oral   Take 1 tablet (400 mg total) by mouth 2 (two) times daily with a meal.   60 tablet   5   . prochlorperazine (COMPAZINE) 10 MG tablet   Oral   Take 10 mg by mouth every 6 (six) hours as needed (nausea).          . promethazine (PHENERGAN) 25 MG tablet   Oral   Take 1 tablet (25 mg total) by mouth every 6 (six) hours as needed for nausea.   30 tablet   1   . ranitidine (ZANTAC) 150 MG capsule   Oral   Take 150 mg by mouth at bedtime.         . vitamin E (VITAMIN E) 400 UNIT capsule   Oral   Take 1 capsule (400 Units total) by mouth  2 (two) times daily.   60 capsule   5   . EXPIRED: ranitidine (ZANTAC) 150 MG tablet   Oral   Take 150 mg by mouth daily.           BP 104/75  Pulse 101  Temp(Src) 98.6 F (37 C) (Oral)  Resp 19  Ht 5\' 3"  (1.6 m)  Wt 114 lb (51.71  kg)  BMI 20.20 kg/m2  SpO2 90% Physical Exam  Constitutional: She is oriented to person, place, and time. She appears well-developed and well-nourished. No distress.  HENT:  Head: Normocephalic.  Eyes: Conjunctivae are normal. Pupils are equal, round, and reactive to light. No scleral icterus.  Neck: Normal range of motion. Neck supple. No thyromegaly present.  Cardiovascular: Normal rate and regular rhythm.  Exam reveals no gallop and no friction rub.   No murmur heard. Pulmonary/Chest: Effort normal and breath sounds normal. No respiratory distress. She has no wheezes. She has no rales.  Abdominal: Soft. Bowel sounds are normal. She exhibits no distension. There is no tenderness. There is no rebound.  Musculoskeletal: Normal range of motion.  Neurological: She is alert and oriented to person, place, and time.  Awake alert. Normal use of the upper and lower extremities. Normal finger to nose. Normal strength. Normal sensation. Symmetric exam.  Skin: Skin is warm and dry. No rash noted.  Psychiatric: She has a normal mood and affect. Her behavior is normal.    ED Course  Procedures (including critical care time) Labs Review Labs Reviewed  BASIC METABOLIC PANEL - Abnormal; Notable for the following:    Potassium 3.5 (*)    All other components within normal limits   Imaging Review Ct Head Wo Contrast  03/18/2013   CLINICAL DATA:  Known lung carcinoma ; currently with headache and vertigo  EXAM: CT HEAD WITHOUT CONTRAST  TECHNIQUE: Contiguous axial images were obtained from the base of the skull through the vertex without intravenous contrast.  COMPARISON:  Brain MRI January 07, 2013 and brain CT March 05, 2012  FINDINGS: There remains  extensive vasogenic edema throughout the right posterior superior temporal lobe, superior right occipital lobe, and portions of the posterior right frontal and right parietal lobes. There is again noted a focal mass in this area consistent with known of lung carcinoma metastasis measuring 2.0 x 1.7 cm. There is currently midline shift to the left of 1 cm.  No new mass is seen. There is no hemorrhage. There is no subdural or epidural fluid. There is vasogenic edema in the medial frontal lobe slightly inferior to the lateral ventricles. The a mass consistent with metastatic focus is seen on MR in this region, and the degree of edema in this area remain stable compared to 2 weeks prior. No new edema is appreciable compared to 2 weeks prior. Note that there is a focal calcification in the anterior right parietal lobe which is stable.  Bony calvarium appears intact.  The mastoid air cells are clear.  IMPRESSION: Stable vasogenic edema on the right from known metastases. There is midline shift toward the right which has increased compared to 2 weeks prior. There is no acute infarct or acute hemorrhage. No new metastases identified on this noncontrast enhanced study.   Electronically Signed   By: Lowella Grip M.D.   On: 03/18/2013 16:33    EKG Interpretation   None       MDM   1. Seizure   2. Cerebral edema    I discussed the case with Dr. Lorelle Formosa on call for Dr. Earlie Server the patient's oncologist. Also discussed case was tried hospitalist. The patient lives alone. Has cerebral edema with midline shift that is slightly worse radiographically and comparison at 13 days ago. Clinically does not show signs of herniation. Symmetric pupils. Symmetric peripheral strength and sensation. Normal sensorium. Awake alert. Given IV Decadron. Plan will be admission.   Elta Guadeloupe  Jeneen Rinks, MD 03/18/13 [phone]

## 2013-03-18 NOTE — ED Notes (Addendum)
Melanie Liu [phone].

## 2013-03-18 NOTE — ED Notes (Signed)
Hospitalist at bedside 

## 2013-03-19 ENCOUNTER — Telehealth: Payer: Self-pay | Admitting: Internal Medicine

## 2013-03-19 DIAGNOSIS — G936 Cerebral edema: Secondary | ICD-10-CM

## 2013-03-19 DIAGNOSIS — C7931 Secondary malignant neoplasm of brain: Principal | ICD-10-CM

## 2013-03-19 DIAGNOSIS — C7949 Secondary malignant neoplasm of other parts of nervous system: Principal | ICD-10-CM

## 2013-03-19 DIAGNOSIS — C3410 Malignant neoplasm of upper lobe, unspecified bronchus or lung: Secondary | ICD-10-CM

## 2013-03-19 DIAGNOSIS — E440 Moderate protein-calorie malnutrition: Secondary | ICD-10-CM

## 2013-03-19 MED ORDER — LORAZEPAM 1 MG PO TABS: 1.0000 mg | ORAL_TABLET | Freq: Three times a day (TID) | ORAL | Status: DC | PRN

## 2013-03-19 MED ORDER — PENTOXIFYLLINE ER 400 MG PO TBCR
400.0000 mg | EXTENDED_RELEASE_TABLET | Freq: Two times a day (BID) | ORAL | Status: DC
  Administered 2013-03-19 – 2013-03-20 (×3): 400 mg via ORAL
  Filled 2013-03-19 (×5): qty 1

## 2013-03-19 MED ORDER — PROMETHAZINE HCL 25 MG PO TABS: 25.0000 mg | ORAL_TABLET | Freq: Four times a day (QID) | ORAL | Status: DC | PRN

## 2013-03-19 MED ORDER — ALBUTEROL SULFATE (5 MG/ML) 0.5% IN NEBU
2.5000 mg | INHALATION_SOLUTION | Freq: Four times a day (QID) | RESPIRATORY_TRACT | Status: DC | PRN
  Filled 2013-03-19: qty 3
  Filled 2013-03-19: qty 0.5

## 2013-03-19 MED ORDER — OXYCODONE-ACETAMINOPHEN 5-325 MG PO TABS
1.0000 | ORAL_TABLET | ORAL | Status: DC | PRN
  Administered 2013-03-19 (×2): 1 via ORAL
  Filled 2013-03-19 (×2): qty 1

## 2013-03-19 MED ORDER — IPRATROPIUM BROMIDE 0.02 % IN SOLN
500.0000 ug | RESPIRATORY_TRACT | Status: DC | PRN
  Administered 2013-03-19: 500 ug via RESPIRATORY_TRACT
  Filled 2013-03-19: qty 2.5

## 2013-03-19 MED ORDER — LEVETIRACETAM 750 MG PO TABS
1500.0000 mg | ORAL_TABLET | Freq: Two times a day (BID) | ORAL | Status: DC
  Administered 2013-03-19 – 2013-03-20 (×4): 1500 mg via ORAL
  Filled 2013-03-19 (×5): qty 2

## 2013-03-19 MED ORDER — VITAMIN E 180 MG (400 UNIT) PO CAPS
400.0000 [IU] | ORAL_CAPSULE | Freq: Two times a day (BID) | ORAL | Status: DC
  Administered 2013-03-19 – 2013-03-20 (×3): 400 [IU] via ORAL
  Filled 2013-03-19 (×4): qty 1

## 2013-03-19 MED ORDER — FAMOTIDINE 20 MG PO TABS
20.0000 mg | ORAL_TABLET | Freq: Every day | ORAL | Status: DC
  Administered 2013-03-19 – 2013-03-20 (×2): 20 mg via ORAL
  Filled 2013-03-19 (×2): qty 1

## 2013-03-19 NOTE — Progress Notes (Signed)
INITIAL NUTRITION ASSESSMENT  DOCUMENTATION CODES Per approved criteria  -Not Applicable   INTERVENTION: - Encouraged pt to continue to eat excellent at mealtimes - Will continue to monitor   NUTRITION DIAGNOSIS: Increased nutrient needs related to lung cancer with brain metastasis as evidenced by MD notes.   Goal: Pt to consume >90% of meals  Monitor:  Weights, labs, intake  Reason for Assessment: Consult  63 y.o. female  Admitting Dx: Seizure  ASSESSMENT: Pt with past medical history significant for lung cancer with brain metastasis, recent hospitalization discharged a week ago comes in with seizure like activity yesterday, left arm involuntary movement and worsening left sided weakness. Her involuntary movement lasted about 25 min. No LOC, fall, urinary incontinence reported.   Met with pt who reports eating well with excellent appetite, eating 100% of meals since admission and 3 meals/day at home with stable weight. Not on any nutritional supplements. Denies any nutritional concerns. Denies any decreased muscle strength.    Height: Ht Readings from Last 1 Encounters:  03/18/13 _0  (1.6 m)    Weight: Wt Readings from Last 1 Encounters:  03/18/13 108 lb 11 oz (49.3 kg)    Ideal Body Weight: 115 lb  % Ideal Body Weight: 94%  Wt Readings from Last 10 Encounters:  03/18/13 108 lb 11 oz (49.3 kg)  03/05/13 110 lb (49.896 kg)  03/02/13 110 lb 8 oz (50.122 kg)  02/12/13 107 lb 12.8 oz (48.898 kg)  01/13/13 105 lb 1.6 oz (47.673 kg)  01/08/13 102 lb 11.8 oz (46.6 kg)  01/01/13 104 lb 14.4 oz (47.582 kg)  12/04/12 98 lb 3.2 oz (44.543 kg)  11/20/12 101 lb 11.2 oz (46.131 kg)  10/22/12 102 lb 6.4 oz (46.448 kg)    Usual Body Weight: 110 lb   % Usual Body Weight: 98%  BMI:  Body mass index is 19.26 kg/(m^2).  Estimated Nutritional Needs: Kcal: 1500-1700 Protein: 60-80g Fluid: 1.5-1.7L/day  Skin: Intact  Diet Order: General  EDUCATION NEEDS: -No  education needs identified at this time   Intake/Output Summary (Last 24 hours) at 03/19/13 0938 Last data filed at 03/19/13 0744  Gross per 24 hour  Intake    480 ml  Output      0 ml  Net    480 ml    Last BM: 1/13  Labs:   Recent Labs Lab 03/18/13 1605  NA 142  K 3.5*  CL 102  CO2 26  BUN 12  CREATININE 0.64  CALCIUM 9.5  GLUCOSE 92    CBG (last 3)  No results found for this basename: GLUCAP,  in the last 72 hours  Scheduled Meds: . dexamethasone  4 mg Intravenous Q6H  . famotidine  20 mg Oral Daily  . levETIRAcetam  1,500 mg Oral BID  . nicotine  14 mg Transdermal Daily  . pentoxifylline  400 mg Oral BID WC  . sodium chloride  3 mL Intravenous Q12H  . vitamin E  400 Units Oral BID    Continuous Infusions:   Past Medical History  Diagnosis Date  . Nodule of right lung     right upper lobe  . Anemia   . Rectovaginal fistula   . Right frontal lobe lesion 12/13/11    CT head  . History of radiation therapy 12/24/11-02/08/12    rul 66Gy/67fs  . Dysphagia   . History of radiation therapy 12/27/11    SRS rt ant frontal 20gy  . Lung cancer 11/23/11  biopsy-right  . Brain metastases   . Chest pain at rest 08/26/2012  . COPD (chronic obstructive pulmonary disease)   . Shortness of breath     Past Surgical History  Procedure Laterality Date  . Partial hysterectomy    . Hemmorhoid surgery    . Neck surgery    . Retinal detachment surgery    . Esophagogastroduodenoscopy N/A 05/01/2012    Procedure: ESOPHAGOGASTRODUODENOSCOPY (EGD);  Surgeon: Lear Ng, MD;  Location: [facility];  Service: Endoscopy;  Laterality: N/A;  . Subxyphoid pericardial window N/A 08/28/2012    Procedure: SUBXYPHOID PERICARDIAL WINDOW;  Surgeon: Ivin Poot, MD;  Location: [facility] OR;  Service: Thoracic;  Laterality: N/A;  . Tee without cardioversion  08/28/2012    Procedure: TRANSESOPHAGEAL ECHOCARDIOGRAM (TEE);  Surgeon: Ivin Poot, MD;  Location: [place];   Service: Thoracic;;  . Eye surgery    . Tubal ligation    . Mass excision Right 09/26/2012    Procedure: EXCISION MASS;  Surgeon: Ivin Poot, MD;  Location: [facility] OR;  Service: Vascular;  Laterality: Right;  EXCISION OF RIGHT NECK  MASS  . Abdominal hysterectomy  1988    [facility] - Dr. Daivd Council    [employer] [place], [place], [place] Pager [phone] After Hours Pager

## 2013-03-19 NOTE — Progress Notes (Signed)
DIAGNOSIS: Metastatic non-small cell lung cancer, adenocarcinoma with negative EGFR mutation and negative ALK gene translocation diagnosed in September of 2013 with metastatic single brain lesion.  PRIOR THERAPY:  1) Status post stereotactic radiosurgery to the single brain lesion under the care Dr. Tammi Klippel.  2) Concurrent chemoradiation with chemotherapy in the form of weekly carboplatin for an AUC of 2 and paclitaxel at 45 mg per meter squared concurrent with radiation therapy under the care Dr. Tammi Klippel with partial response in her disease.  3) Systemic chemotherapy with carboplatin for AUC of 5 and Alimta 500 mg/M2 every 3 weeks. From cycle 3 forward she is receiving carboplatin for an AUC of 4 and Alimta at 375 mg per meter squared due to thrombocytopenia. Status post a total of 6 cycles with evidence for disease progression.  4) Subxiphoid pericardial window for drainage of pericardial effusion under the care of Dr. Prescott Gum on 08/28/2012 and the final pathology showed no malignant cells in the pleural fluid.  5) Tarceva 150 mg by mouth daily. Therapy started 09/30/2012. Status post approximately 3 months of therapy.  6) stereotactic biopsy followed by MRI- Thermometry guided laser interstitial Thermal therapy (LITT therapy) at [facility] under the care of Dr. Angelene Giovanni on 01/25/2013.  CURRENT THERAPY: Patient currently being considered for the immunotherapy with Nivolumab clinical trial  CHEMOTHERAPY INTENT: Palliative  CURRENT # OF CHEMOTHERAPY CYCLES: Approximately 3 monthsof Tarceva  CURRENT ANTIEMETICS: None  CURRENT SMOKING STATUS: Current smoker  ORAL CHEMOTHERAPY AND CONSENT: yes  CURRENT BISPHOSPHONATES USE: none  PAIN MANAGEMENT: Percocet  NARCOTICS INDUCED CONSTIPATION: none  LIVING WILL AND CODE STATUS: No CODE BLUE.  Subjective: The patient is seen and examined today. She was admitted yesterday with seizure secondary to metastatic brain lesions. She has been on  a tapering dose of Decadron down to 2 mg by mouth daily. She was started again on high-dose Decadron 10 mg IV followed by 4 mg every 6 hours. She is feeling better today. The patient is also on Keppra for recurrent seizure. This is the second  Admission for seizure in this in 2 weeks. She denied having any other significant complaints. She denied having any fever, chills, nausea, vomiting. She denied having any chest pain, shortness breath, cough or hemoptysis.  Objective: Vital signs in last 24 hours: Temp:  [97.8 F (36.6 C)-98.6 F (37 C)] 97.8 F (36.6 C) (01/15 0535) Pulse Rate:  [84-104] 84 (01/15 0535) Resp:  [19-21] 20 (01/15 0535) BP: (101-117)/(67-79) 117/74 mmHg (01/15 0535) SpO2:  [90 %-96 %] 94 % (01/15 0535) Weight:  [108 lb 11 oz (49.3 kg)-114 lb (51.71 kg)] 108 lb 11 oz (49.3 kg) (01/14 2039)  Intake/Output from previous day: 01/14 0701 - 01/15 0700 In: 240 [P.O.:240] Out: -  Intake/Output this shift: Total I/O In: 240 [P.O.:240] Out: -   General appearance: alert, cooperative, fatigued and no distress Resp: clear to auscultation bilaterally Cardio: regular rate and rhythm, S1, S2 normal, no murmur, click, rub or gallop GI: soft, non-tender; bowel sounds normal; no masses,  no organomegaly Extremities: extremities normal, atraumatic, no cyanosis or edema  Lab Results:   Recent Labs  03/18/13 1916  WBC 13.6*  HGB 11.6*  HCT 36.2  PLT 296   BMET  Recent Labs  03/18/13 1605  NA 142  K 3.5*  CL 102  CO2 26  GLUCOSE 92  BUN 12  CREATININE 0.64  CALCIUM 9.5    Studies/Results: Ct Head Wo Contrast  03/18/2013  CLINICAL DATA:  Known lung carcinoma ; currently with headache and vertigo  EXAM: CT HEAD WITHOUT CONTRAST  TECHNIQUE: Contiguous axial images were obtained from the base of the skull through the vertex without intravenous contrast.  COMPARISON:  Brain MRI January 07, 2013 and brain CT March 05, 2012  FINDINGS: There remains extensive vasogenic  edema throughout the right posterior superior temporal lobe, superior right occipital lobe, and portions of the posterior right frontal and right parietal lobes. There is again noted a focal mass in this area consistent with known of lung carcinoma metastasis measuring 2.0 x 1.7 cm. There is currently midline shift to the left of 1 cm.  No new mass is seen. There is no hemorrhage. There is no subdural or epidural fluid. There is vasogenic edema in the medial frontal lobe slightly inferior to the lateral ventricles. The a mass consistent with metastatic focus is seen on MR in this region, and the degree of edema in this area remain stable compared to 2 weeks prior. No new edema is appreciable compared to 2 weeks prior. Note that there is a focal calcification in the anterior right parietal lobe which is stable.  Bony calvarium appears intact.  The mastoid air cells are clear.  IMPRESSION: Stable vasogenic edema on the right from known metastases. There is midline shift toward the right which has increased compared to 2 weeks prior. There is no acute infarct or acute hemorrhage. No new metastases identified on this noncontrast enhanced study.   Electronically Signed   By: Lowella Grip M.D.   On: 03/18/2013 16:33    Medications: I have reviewed the patient's current medications.  Assessment/Plan: 1) metastatic non-small cell lung cancer, adenocarcinoma with multiple recurrent brain metastasis is status post whole brain irradiation as well as the stereotactic radiotherapy. She was also recently treated with AUTO LITT therapy at [facility] for necrotic brain lesions.  She has been off systemic chemotherapy for more than 2 months. I discussed with the patient her treatment options and I don't think she would be a good candidate to resume systemic therapy with her frequent seizure activity. I strongly recommended for the patient to consider palliative care and hospice referral at this point. She  would like to discuss this with her family before making a final decision. 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 Continue Keppra and Decadron. Consider evaluation by neurology to see if they have any other recommendations Thank you so much for taking good care of Ms. Melanie Liu. I will continue to follow up the patient with you and assist in her management.   LOS: 1 day    Elder Davidian K. 03/19/2013

## 2013-03-19 NOTE — Telephone Encounter (Signed)
returned pt call and advised to call back with d/t she wanted to r/s due to being in hospital now.

## 2013-03-19 NOTE — Progress Notes (Signed)
[facility] PROGRESS NOTE  Melanie Liu ZDG:[id number] DOB: 1950-09-24 DOA: 03/18/2013 PCP: Sherrie Mustache, MD   64 year old female with history of lung cancer with brain metastases Comycin hospitalization for seizures presented we've left-sided involuntary movements and worsening weakness lasting for almost 25 minutes. Patient admitted with concern for seizures. Head CT shows worsened midline shift.  Assessment/Plan: Metastatic lung cancer to the brain. Patient has had stereotactic radiosurgery in the past for brain metastases and status post chemoradiation. Currently on Decadron taper for brain edema and on Keppra for seizures. -Continue neurochecks and seizure precautions. Ativan when necessary for seizures. Dose of Keppra increased on admission. -Patient placed on IV Decadron. I discussed with her oncologist Dr. Julien Nordmann who recommends that she would not benefit from further chemotherapy and given progression of the disease rinse for palliative care consult. Patient agrees to discuss with palliative care regarding goals of care and wants to have her son participate in the conversation. She would like to be discharged  Home with hospice if she agrees to go that route.  COPD Continue bronchodilators  Tobacco abuse Counseled on cessation. On nicotine patch  Protein calorie  malnutrition Appreciate nutrition consult   Code Status: DO NOT RESUSCITATE Family Communication: None at bedside Disposition Plan: Pending palliative care consult. Likely home with hospice   Consultants:  Dr Earlie Server  Procedures:  None  Antibiotics:  None  HPI/Subjective: Denies any symptoms.  Objective: Filed Vitals:   03/19/13 0535  BP: 117/74  Pulse: 84  Temp: 97.8 F (36.6 C)  Resp: 20    Intake/Output Summary (Last 24 hours) at 03/19/13 1410 Last data filed at 03/19/13 1338  Gross per 24 hour  Intake    720 ml  Output      0 ml  Net    720 ml   Filed Weights    03/18/13 1449 03/18/13 2039  Weight: 51.71 kg (114 lb) 49.3 kg (108 lb 11 oz)    Exam:   General:  Energy female in no acute distress  HEENT: No pallor, moist oral mucosa  Chest: 2 to auscultation bilaterally, no added sounds  CVS: Normal S1-S2, no murmurs or rubs  Abdomen: Soft, nontender, nondistended, bowel sounds present  Extremities: Warm, no edema  CNS: AAO x3    Data Reviewed: Basic Metabolic Panel:  Recent Labs Lab 03/18/13 1605  NA 142  K 3.5*  CL 102  CO2 26  GLUCOSE 92  BUN 12  CREATININE 0.64  CALCIUM 9.5   Liver Function Tests: No results found for this basename: AST, ALT, ALKPHOS, BILITOT, PROT, ALBUMIN,  in the last 168 hours No results found for this basename: LIPASE, AMYLASE,  in the last 168 hours No results found for this basename: AMMONIA,  in the last 168 hours CBC:  Recent Labs Lab 03/18/13 1916  WBC 13.6*  HGB 11.6*  HCT 36.2  MCV 95.3  PLT 296   Cardiac Enzymes: No results found for this basename: CKTOTAL, CKMB, CKMBINDEX, TROPONINI,  in the last 168 hours BNP (last 3 results)  Recent Labs  08/27/12 0200  PROBNP 635.4*   CBG: No results found for this basename: GLUCAP,  in the last 168 hours  No results found for this or any previous visit (from the past 240 hour(s)).   Studies: Ct Head Wo Contrast  03/18/2013   CLINICAL DATA:  Known lung carcinoma ; currently with headache and vertigo  EXAM: CT HEAD WITHOUT CONTRAST  TECHNIQUE: Contiguous axial images were obtained from the  base of the skull through the vertex without intravenous contrast.  COMPARISON:  Brain MRI January 07, 2013 and brain CT March 05, 2012  FINDINGS: There remains extensive vasogenic edema throughout the right posterior superior temporal lobe, superior right occipital lobe, and portions of the posterior right frontal and right parietal lobes. There is again noted a focal mass in this area consistent with known of lung carcinoma metastasis measuring 2.0 x  1.7 cm. There is currently midline shift to the left of 1 cm.  No new mass is seen. There is no hemorrhage. There is no subdural or epidural fluid. There is vasogenic edema in the medial frontal lobe slightly inferior to the lateral ventricles. The a mass consistent with metastatic focus is seen on MR in this region, and the degree of edema in this area remain stable compared to 2 weeks prior. No new edema is appreciable compared to 2 weeks prior. Note that there is a focal calcification in the anterior right parietal lobe which is stable.  Bony calvarium appears intact.  The mastoid air cells are clear.  IMPRESSION: Stable vasogenic edema on the right from known metastases. There is midline shift toward the right which has increased compared to 2 weeks prior. There is no acute infarct or acute hemorrhage. No new metastases identified on this noncontrast enhanced study.   Electronically Signed   By: Lowella Grip M.D.   On: 03/18/2013 16:33    Scheduled Meds: . dexamethasone  4 mg Intravenous Q6H  . famotidine  20 mg Oral Daily  . levETIRAcetam  1,500 mg Oral BID  . nicotine  14 mg Transdermal Daily  . pentoxifylline  400 mg Oral BID WC  . sodium chloride  3 mL Intravenous Q12H  . vitamin E  400 Units Oral BID   Continuous Infusions:     Time spent: 25 minutes    Melanie Liu  [facility] Pager [phone] If 7PM-7AM, please contact night-coverage at [email or web address], password [facility] 03/19/2013, 2:10 PM  LOS: 1 day

## 2013-03-19 NOTE — Progress Notes (Signed)
Received referral for [place] [facility] on behalf of [place] program. Patient not eligible for [facility] Care Management services. [facility] Care Management does not follow Medicaid at this time.  Marthenia Rolling, MSN, RN,BSN- [facility] [phone]

## 2013-03-19 NOTE — Progress Notes (Signed)
Pt on tele monitor r/t slightly tachy HR on admission.  Central tele notified the RN via text that her HR was jumping up to the 160s at times, but not sustaining.  Pt was sleeping comfortably, and vitals were taken which were stable at 113/79, HR 94, T 98.1, and O2 sats 93% on RA.  She said she had no complaints and sometimes her HR would get fast at home, but she did not have a history of Afib.  MD on call paged about irregular and tachy HR, no call back or new orders were put in.  The [facility] and monitor tech at central telemetry were also contacted. The MT said the patient's rhythm showing up on the monitor was not truly that fast and it was not actually Afib.  A strip was printed and placed in the chart.  Will continue to monitor patient.

## 2013-03-19 NOTE — Progress Notes (Signed)
Report received form [place] Therapist, sports. Patient resting in bed with no complaints of pain at this time. Bed in low position call light in reach. Will continue to monitor patient.

## 2013-03-20 ENCOUNTER — Telehealth: Payer: Self-pay | Admitting: Medical Oncology

## 2013-03-20 DIAGNOSIS — Z515 Encounter for palliative care: Secondary | ICD-10-CM

## 2013-03-20 DIAGNOSIS — C3490 Malignant neoplasm of unspecified part of unspecified bronchus or lung: Secondary | ICD-10-CM

## 2013-03-20 MED ORDER — IPRATROPIUM BROMIDE 0.02 % IN SOLN: 500.0000 ug | RESPIRATORY_TRACT | Status: DC | PRN

## 2013-03-20 MED ORDER — ALBUTEROL SULFATE (5 MG/ML) 0.5% IN NEBU
2.5000 mg | INHALATION_SOLUTION | Freq: Four times a day (QID) | RESPIRATORY_TRACT | Status: DC | PRN
Start: 2013-03-20 — End: 2013-03-28

## 2013-03-20 MED ORDER — DEXAMETHASONE 4 MG PO TABS: 4.0000 mg | ORAL_TABLET | Freq: Two times a day (BID) | ORAL | Status: DC

## 2013-03-20 MED ORDER — LEVETIRACETAM 1000 MG PO TABS: 1500.0000 mg | ORAL_TABLET | Freq: Two times a day (BID) | ORAL | Status: AC

## 2013-03-20 MED ORDER — PENTOXIFYLLINE ER 400 MG PO TBCR: 400.0000 mg | EXTENDED_RELEASE_TABLET | Freq: Two times a day (BID) | ORAL | Status: AC

## 2013-03-20 NOTE — Discharge Summary (Signed)
Physician Discharge Summary  Melanie Liu OIN:[id number] DOB: 1950-09-21 DOA: 03/18/2013  PCP: Sherrie Mustache, MD  Admit date: 03/18/2013 Discharge date: 03/20/2013  Time spent: 40 minutes  Recommendations for Outpatient Follow-up:  1. Home with outpt follow up with oncology  Discharge Diagnoses:  Principal Problem:   Seizure  Active Problems:   Brain Mets - 17 mm Rt Frontal s/p SRS, then Lt Frontal 5 mm and Rt Parietal 18 mm mets s/p SRS, now with new 5 mm Rt Parietal met   COPD (chronic obstructive pulmonary disease)   Tobacco abuse   Left-sided weakness   Lung cancer with brain metastasis   Cerebral edema   Moderate protein-calorie malnutrition   Discharge Condition: fair  Diet recommendation: regular  Filed Weights   03/18/13 1449 03/18/13 2039  Weight: 51.71 kg (114 lb) 49.3 kg (108 lb 11 oz)    History of present illness:  63 year old female with history of lung cancer with brain metastases Comycin hospitalization for seizures presented we've left-sided involuntary movements and worsening weakness lasting for almost 25 minutes. Patient admitted with concern for seizures. Head CT shows worsened midline shift.   Hospital Course:  Metastatic lung cancer to the brain.  Patient has had stereotactic radiosurgery in the past for brain metastases and status post chemoradiation. Currently on Decadron taper for brain edema and on Keppra for seizures.  - Ativan when necessary for seizures. Dose of Keppra increased on admission. No further seizure activity while in the hospital. Patient placed on IV Decadron.  I discussed with her oncologist Dr. Julien Nordmann who recommends that she would not benefit from further systemic chemotherapy or radiation and given progression of the disease recommended  for palliative care consult for hospice. Patient agreed to discuss with palliative care regarding goals of care and wanted to have her son participate in the conversation. However during  conversation with palliative care in presence of her son she changed her mind and wanted to get an opinion from her neurosurgeon at [facility] and re discuss with Dr Julien Nordmann about any treatment options  during outpatient visit which they plan to schedule next week. -patient is clinically stable and  seizure free. i will discharge her on increased dose of keppra 1500 mg bid. Discussed with neurology consult over the phone. recommended that dose of keppra can be increased further if needed as it works well with myoclonus symptoms before adding another AED. -will discharge on decadron taper ( dose adjusted.)  Patient and family have been provided with information about hospice and they will contact [facility] [place] if needed.  -informed about recurrent risk of seizure and safety at home. Family agree upon providing close supervision at home.  COPD  Continue bronchodilators  On home o2  Tobacco abuse  Counseled on cessation.   Protein calorie malnutrition  Appreciate nutrition consult   Code Status: DO NOT RESUSCITATE  Family Communication: son and daughter in law  at bedside  Disposition Plan: home with outpt oncology follow up  Consultants:  Dr Earlie Server   Procedures:  None   Antibiotics:  None   Discharge Exam: Filed Vitals:   03/20/13 1406  BP: 109/72  Pulse: 118  Temp: 97.5 F (36.4 C)  Resp: 18    General: Energy female in no acute distress  HEENT: No pallor, moist oral mucosa  Chest:clear to auscultation bilaterally, no added sounds  CVS: Normal S1-S2, no murmurs or rubs  Abdomen: Soft, nontender, nondistended, bowel sounds present  Extremities: Warm, no edema  CNS: AAO x3   Discharge Instructions   Future Appointments Provider Department Dept Phone   04/20/2013 9:15 AM Lora Paula, MD [facility]iation [facility]cology [phone]       Medication List         albuterol (5 MG/ML) 0.5% nebulizer solution  Commonly known as:   PROVENTIL  Take 0.5 mLs (2.5 mg total) by nebulization every 6 (six) hours as needed for wheezing.     albuterol (5 MG/ML) 0.5% nebulizer solution  Commonly known as:  PROVENTIL  Take 2.5 mg by nebulization every 6 (six) hours as needed for wheezing.     dexamethasone 4 MG tablet  Commonly known as:  DECADRON  Take 1 tablet (4 mg total) by mouth 2 (two) times daily. Take half pill ($RemoveBe'2mg'VbrZusbNx$ ) twice daily for one week, then half daily ($RemoveBef'2mg'bpeNmrSmrR$ ) once daily for one week then stop.     ipratropium 0.02 % nebulizer solution  Commonly known as:  ATROVENT  Take 2.5 mLs (500 mcg total) by nebulization every 4 (four) hours as needed for wheezing or shortness of breath.     ipratropium 0.02 % nebulizer solution  Commonly known as:  ATROVENT  Take 500 mcg by nebulization every 4 (four) hours as needed for wheezing or shortness of breath.     levETIRAcetam 1000 MG tablet  Commonly known as:  KEPPRA  Take 1.5 tablets (1,500 mg total) by mouth 2 (two) times daily.     LORazepam 1 MG tablet  Commonly known as:  ATIVAN  Place 1 tablet (1 mg total) under the tongue every 8 (eight) hours as needed for anxiety or seizure.     oxyCODONE-acetaminophen 5-325 MG per tablet  Commonly known as:  PERCOCET/ROXICET  Take 1 tablet by mouth every 4 (four) hours as needed for moderate pain or severe pain.     pentoxifylline 400 MG CR tablet  Commonly known as:  TRENTAL  Take 1 tablet (400 mg total) by mouth 2 (two) times daily with a meal.     prochlorperazine 10 MG tablet  Commonly known as:  COMPAZINE  Take 10 mg by mouth every 6 (six) hours as needed (nausea).     promethazine 25 MG tablet  Commonly known as:  PHENERGAN  Take 1 tablet (25 mg total) by mouth every 6 (six) hours as needed for nausea.     ranitidine 150 MG capsule  Commonly known as:  ZANTAC  Take 150 mg by mouth at bedtime.     vitamin E 400 UNIT capsule  Commonly known as:  vitamin E  Take 1 capsule (400 Units total) by mouth 2 (two) times  daily.       Allergies  Allergen Reactions  . Carboplatin Shortness Of Breath  . Levaquin [Levofloxacin In D5w] Rash    itching       Follow-up Information   Follow up with [facility] MD. Schedule an appointment as soon as possible for a visit in 1 week.   Specialty:  Oncology   Contact information:   [address] [place] [address] [phone]        The results of significant diagnostics from this hospitalization (including imaging, microbiology, ancillary and laboratory) are listed below for reference.    Significant Diagnostic Studies: Ct Head Wo Contrast  03/18/2013   CLINICAL DATA:  Known lung carcinoma ; currently with headache and vertigo  EXAM: CT HEAD WITHOUT CONTRAST  TECHNIQUE: Contiguous axial images were obtained from the  base of the skull through the vertex without intravenous contrast.  COMPARISON:  Brain MRI January 07, 2013 and brain CT March 05, 2012  FINDINGS: There remains extensive vasogenic edema throughout the right posterior superior temporal lobe, superior right occipital lobe, and portions of the posterior right frontal and right parietal lobes. There is again noted a focal mass in this area consistent with known of lung carcinoma metastasis measuring 2.0 x 1.7 cm. There is currently midline shift to the left of 1 cm.  No new mass is seen. There is no hemorrhage. There is no subdural or epidural fluid. There is vasogenic edema in the medial frontal lobe slightly inferior to the lateral ventricles. The a mass consistent with metastatic focus is seen on MR in this region, and the degree of edema in this area remain stable compared to 2 weeks prior. No new edema is appreciable compared to 2 weeks prior. Note that there is a focal calcification in the anterior right parietal lobe which is stable.  Bony calvarium appears intact.  The mastoid air cells are clear.  IMPRESSION: Stable vasogenic edema on the right from known metastases. There is midline  shift toward the right which has increased compared to 2 weeks prior. There is no acute infarct or acute hemorrhage. No new metastases identified on this noncontrast enhanced study.   Electronically Signed   By: Lowella Grip M.D.   On: 03/18/2013 16:33   Ct Head Wo Contrast  03/05/2013   CLINICAL DATA:  Seizures, headaches, history of lung cancer  EXAM: CT HEAD WITHOUT CONTRAST  TECHNIQUE: Contiguous axial images were obtained from the base of the skull through the vertex without intravenous contrast.  COMPARISON:  01/08/2013, MR brain 01/07/2013  FINDINGS: There is a 14 mm right posterior parietal lobe mass with severe surrounding vasogenic edema. There is a 15 mm mass in the right parietal lobes more inferiorly adjacent to the right lateral ventricle with severe surrounding vasogenic edema. The previously demonstrated right frontal lobe mass is not well appreciated on the current exam. The this jetting edema effaces the posterior aspect of the right lateral ventricle. There is no hydrocephalus. There is no intracranial hemorrhage. There is no evidence of cortical based acute infarction. There is 4.3 mm of right-to-left midline shift.  The basal cisterns are patent.  Visualized portions of the orbits are unremarkable. The visualized portions of the paranasal sinuses and mastoid air cells are unremarkable.  The osseous structures are unremarkable.  IMPRESSION: 1. No acute intracranial hemorrhage or cortical infarction. 2. Persistent cerebral metastatic disease with associated severe vasogenic edema similar in appearance to the prior exam of 01/08/2013.   Electronically Signed   By: Kathreen Devoid   On: 03/05/2013 11:34    Microbiology: No results found for this or any previous visit (from the past 240 hour(s)).   Labs: Basic Metabolic Panel:  Recent Labs Lab 03/18/13 1605  NA 142  K 3.5*  CL 102  CO2 26  GLUCOSE 92  BUN 12  CREATININE 0.64  CALCIUM 9.5   Liver Function Tests: No results  found for this basename: AST, ALT, ALKPHOS, BILITOT, PROT, ALBUMIN,  in the last 168 hours No results found for this basename: LIPASE, AMYLASE,  in the last 168 hours No results found for this basename: AMMONIA,  in the last 168 hours CBC:  Recent Labs Lab 03/18/13 1916  WBC 13.6*  HGB 11.6*  HCT 36.2  MCV 95.3  PLT 296   Cardiac Enzymes: No  results found for this basename: CKTOTAL, CKMB, CKMBINDEX, TROPONINI,  in the last 168 hours BNP: BNP (last 3 results)  Recent Labs  08/27/12 0200  PROBNP 635.4*   CBG: No results found for this basename: GLUCAP,  in the last 168 hours     Signed:  Louellen Molder  [facility] 03/20/2013, 2:19 PM

## 2013-03-20 NOTE — Telephone Encounter (Signed)
Anis,RN from [facility] called to say pts daughter in law called her requesting hospice referral. I sent records and left voice mail for Kora to call me about follow up appointment ( as suggested by NP who discharged pt today).

## 2013-03-20 NOTE — Telephone Encounter (Signed)
Melanie Liu , NP from inpt stated Pt and Family needs appt with Julien Nordmann to make sure they understand pt options. Probably not fully hearing Dr Worthy Flank recommendations. NP request earliest appt .  Note to Dr Julien Nordmann.

## 2013-03-20 NOTE — Discharge Instructions (Signed)
Seizure, Adult A seizure means there is unusual activity in the brain. A seizure can cause changes in attention or behavior. Seizures often cause shaking (convulsions). Seizures often last from 30 seconds to 2 minutes. HOME CARE   If you are given medicines, take them exactly as told by your doctor.  Keep all doctor visits as told.  Do not swim or drive until your doctor says it is okay.  Teach others what to do if you have a seizure. They should:  Lay you on the ground.  Put a cushion under your head.  Loosen any tight clothing around your neck.  Turn you on your side.  Stay with you until you get better. GET HELP RIGHT AWAY IF:   The seizure lasts longer than 2 to 5 minutes.  The seizure is very bad.  The person does not wake up after the seizure.  The person's attention or behavior changes. Drive the person to the emergency room or call your local emergency services (911 in [place] MAKE SURE YOU:   Understand these instructions.  Will watch your condition.  Will get help right away if you are not doing well or get worse. Document Released: 08/08/2007 Document Revised: 05/14/2011 Document Reviewed: 02/07/2011 [employer] Patient Information 2014 [employer].  

## 2013-03-20 NOTE — Consult Note (Signed)
[name]      DOB: [birth year]      XNA:[id number]     Consult Note from the Palliative Medicine Team at [facility] Requested by: Dr. Clementeen Graham  PCP: Sherrie Mustache, MD Reason for Consultation: Clarification GOC and options. Phone Number:[phone]  Assessment of patients Current state: Melanie Liu is a 64 yo female with history of lung cancer and brain metastases with vasogenic edema and worsening shift towards the right and recurrent seizures. We met today with Melanie Liu and her son and daughter-in-law Melanie Liu and Melanie Liu). Melanie Liu and her son do not fully understand the extent of her cancer and prognosis. When asked how she feels deep in her heart about her cancer she answers "I know I'm going to beat this." She is interested in any treatment options that may be available to her at this point. She also needs to hear clearly if there are no more options to offer her (we did tell her this may be the case). She nor her son are on a comfort path at this point although she does highly value quality of life. She says yes that she does understand this is not a curable disease but she does not understand what this looks like for her. She did say during the conversation that she would not want a feeding tube and be fed artificially. She (and her son) need to hear from Dr. Inda Merlin where they are in terms of her cancer, what options they have at this point, and prognosis.   She is also insistent in going home today. We voiced our concern in her safety being home and living independently. She says her son lives 15 minutes away and and that her sister checks on her and they continue to think "she'll be okay." We did explain her high risk of more seizures. We recommended 24 hour assistance. Dr. Clementeen Graham was called per family request and met with them as well. He further discussed our concerns for her going home and concern for her prognosis and plan moving forward. She still wants to go  home today and follow up with Dr. Inda Merlin.    Goals of Care: 1.  Code Status: DNR   2. Scope of Treatment: No limitations in treatment at this time. These will be dependent on outcomes. She is DNR and she did say she would not want feeding tube.   4. Disposition: Home. We strongly recommended 24 hour assistance in the home.   3. Symptom Management:   1. Seizures: Continue Keppra. Continue Decadron maintenance dose at home to help with cerebral edema.   4. Psychosocial: Emotional support provided to patient and family during difficult conversation.   Brief HPI: 63 yo female with metastatic lung cancer.   ROS: Denies pain, nausea.   PMH:  Past Medical History  Diagnosis Date  . Nodule of right lung     right upper lobe  . Anemia   . Rectovaginal fistula   . Right frontal lobe lesion 12/13/11    CT head  . History of radiation therapy 12/24/11-02/08/12    rul 66Gy/109fxs  . Dysphagia   . History of radiation therapy 12/27/11    SRS rt ant frontal 20gy  . Lung cancer 11/23/11    biopsy-right  . Brain metastases   . Chest pain at rest 08/26/2012  . COPD (chronic obstructive pulmonary disease)   . Shortness of breath      PSH: Past Surgical History  Procedure Laterality  Date  . Partial hysterectomy    . Hemmorhoid surgery    . Neck surgery    . Retinal detachment surgery    . Esophagogastroduodenoscopy N/A 05/01/2012    Procedure: ESOPHAGOGASTRODUODENOSCOPY (EGD);  Surgeon: Lear Ng, MD;  Location: [facility];  Service: Endoscopy;  Laterality: N/A;  . Subxyphoid pericardial window N/A 08/28/2012    Procedure: SUBXYPHOID PERICARDIAL WINDOW;  Surgeon: Ivin Poot, MD;  Location: [facility] OR;  Service: Thoracic;  Laterality: N/A;  . Tee without cardioversion  08/28/2012    Procedure: TRANSESOPHAGEAL ECHOCARDIOGRAM (TEE);  Surgeon: Ivin Poot, MD;  Location: [facility];  Service: Thoracic;;  . Eye surgery    . Tubal ligation    . Mass excision Right  09/26/2012    Procedure: EXCISION MASS;  Surgeon: Ivin Poot, MD;  Location: [facility] OR;  Service: Vascular;  Laterality: Right;  EXCISION OF RIGHT NECK  MASS  . Abdominal hysterectomy  1988    [facility] - Dr. Daivd Council   I have reviewed the [facility] and  If appropriate update it with new information. Allergies  Allergen Reactions  . Carboplatin Shortness Of Breath  . Levaquin [Levofloxacin In D5w] Rash    itching   Scheduled Meds: . dexamethasone  4 mg Intravenous Q6H  . famotidine  20 mg Oral Daily  . levETIRAcetam  1,500 mg Oral BID  . nicotine  14 mg Transdermal Daily  . pentoxifylline  400 mg Oral BID WC  . sodium chloride  3 mL Intravenous Q12H  . vitamin E  400 Units Oral BID   Continuous Infusions:  PRN Meds:.albuterol, ipratropium, LORazepam, morphine injection, oxyCODONE-acetaminophen, promethazine    BP 109/77  Pulse 87  Temp(Src) 97.8 F (36.6 C) (Oral)  Resp 20  Ht _0  (1.6 m)  Wt 49.3 kg (108 lb 11 oz)  BMI 19.26 kg/m2  SpO2 99%   PPS: 50%   Intake/Output Summary (Last 24 hours) at 03/20/13 1207 Last data filed at 03/19/13 1338  Gross per 24 hour  Intake    240 ml  Output      0 ml  Net    240 ml   LBM: 03/17/13               Physical Exam:  General: NAD, alert, pleasant HEENT: [place]/AT, no JVD Chest: Regular rate, symmetric movement, no labored breathes, home O2 CVS: RRR Abdomen: Soft, nondistended  Ext: MAE, no edema Neuro: Alert, oriented  Labs: CBC    Component Value Date/Time   WBC 13.6* 03/18/2013 1916   WBC 13.2* 01/13/2013 1036   RBC 3.80* 03/18/2013 1916   RBC 3.77 01/13/2013 1036   HGB 11.6* 03/18/2013 1916   HGB 11.1* 01/13/2013 1036   HCT 36.2 03/18/2013 1916   HCT 34.3* 01/13/2013 1036   PLT 296 03/18/2013 1916   PLT 376 01/13/2013 1036   MCV 95.3 03/18/2013 1916   MCV 91.2 01/13/2013 1036   MCH 30.5 03/18/2013 1916   MCH 29.4 01/13/2013 1036   MCHC 32.0 03/18/2013 1916   MCHC 32.3 01/13/2013 1036   RDW 15.4 03/18/2013  1916   RDW 20.5* 01/13/2013 1036   LYMPHSABS 0.9 03/05/2013 1128   LYMPHSABS 0.4* 01/13/2013 1036   MONOABS 0.7 03/05/2013 1128   MONOABS 0.7 01/13/2013 1036   EOSABS 0.1 03/05/2013 1128   EOSABS 0.0 01/13/2013 1036   BASOSABS 0.0 03/05/2013 1128   BASOSABS 0.1 01/13/2013 1036    BMET    Component Value  Date/Time   NA 142 03/18/2013 1605   NA 141 01/13/2013 1036   K 3.5* 03/18/2013 1605   K 4.3 01/13/2013 1036   CL 102 03/18/2013 1605   CL 102 08/18/2012 0938   CO2 26 03/18/2013 1605   CO2 26 01/13/2013 1036   GLUCOSE 92 03/18/2013 1605   GLUCOSE 99 01/13/2013 1036   GLUCOSE 114* 08/18/2012 0938   BUN 12 03/18/2013 1605   BUN 18.6 01/13/2013 1036   CREATININE 0.64 03/18/2013 1605   CREATININE 0.7 01/13/2013 1036   CALCIUM 9.5 03/18/2013 1605   CALCIUM 9.8 01/13/2013 1036   GFRNONAA >90 03/18/2013 1605   GFRAA >90 03/18/2013 1605    CMP     Component Value Date/Time   NA 142 03/18/2013 1605   NA 141 01/13/2013 1036   K 3.5* 03/18/2013 1605   K 4.3 01/13/2013 1036   CL 102 03/18/2013 1605   CL 102 08/18/2012 0938   CO2 26 03/18/2013 1605   CO2 26 01/13/2013 1036   GLUCOSE 92 03/18/2013 1605   GLUCOSE 99 01/13/2013 1036   GLUCOSE 114* 08/18/2012 0938   BUN 12 03/18/2013 1605   BUN 18.6 01/13/2013 1036   CREATININE 0.64 03/18/2013 1605   CREATININE 0.7 01/13/2013 1036   CALCIUM 9.5 03/18/2013 1605   CALCIUM 9.8 01/13/2013 1036   PROT 6.4 03/05/2013 1128   PROT 6.3* 01/13/2013 1036   ALBUMIN 3.0* 03/05/2013 1128   ALBUMIN 3.1* 01/13/2013 1036   AST 9 03/05/2013 1128   AST 13 01/13/2013 1036   ALT 6 03/05/2013 1128   ALT 9 01/13/2013 1036   ALKPHOS 85 03/05/2013 1128   ALKPHOS 117 01/13/2013 1036   BILITOT 0.2* 03/05/2013 1128   BILITOT <0.20 01/13/2013 1036   GFRNONAA >90 03/18/2013 1605   GFRAA >90 03/18/2013 1605     Time In Time Out Total Time Spent with Patient Total Overall Time  1210 1400 4mn 1126m    Greater than 50%  of this time was spent counseling and coordinating care  related to the above assessment and plan.  AlVinie SillNP Palliative Medicine Team Team Phone # [phone]

## 2013-03-23 ENCOUNTER — Other Ambulatory Visit: Payer: Self-pay | Admitting: Medical Oncology

## 2013-03-23 ENCOUNTER — Telehealth: Payer: Self-pay | Admitting: Internal Medicine

## 2013-03-23 DIAGNOSIS — C3490 Malignant neoplasm of unspecified part of unspecified bronchus or lung: Secondary | ICD-10-CM

## 2013-03-23 NOTE — Telephone Encounter (Signed)
S/w the pt and she is aware of her appts on 04/07/2013@10 :15am.

## 2013-03-28 ENCOUNTER — Encounter (HOSPITAL_COMMUNITY): Payer: Self-pay | Admitting: Emergency Medicine

## 2013-03-28 ENCOUNTER — Inpatient Hospital Stay (HOSPITAL_COMMUNITY)
Admission: EM | Admit: 2013-03-28 | Discharge: 2013-03-30 | DRG: 054 | Disposition: A | Discharge status: Home Health | Payer: Medicaid Other | Attending: Internal Medicine | Admitting: Internal Medicine

## 2013-03-28 ENCOUNTER — Emergency Department (HOSPITAL_COMMUNITY): Payer: Medicaid Other

## 2013-03-28 DIAGNOSIS — I3139 Other pericardial effusion (noninflammatory): Secondary | ICD-10-CM

## 2013-03-28 DIAGNOSIS — K222 Esophageal obstruction: Secondary | ICD-10-CM

## 2013-03-28 DIAGNOSIS — Z9221 Personal history of antineoplastic chemotherapy: Secondary | ICD-10-CM

## 2013-03-28 DIAGNOSIS — D696 Thrombocytopenia, unspecified: Secondary | ICD-10-CM

## 2013-03-28 DIAGNOSIS — I313 Pericardial effusion (noninflammatory): Secondary | ICD-10-CM

## 2013-03-28 DIAGNOSIS — J690 Pneumonitis due to inhalation of food and vomit: Secondary | ICD-10-CM | POA: Diagnosis present

## 2013-03-28 DIAGNOSIS — Z720 Tobacco use: Secondary | ICD-10-CM

## 2013-03-28 DIAGNOSIS — R569 Unspecified convulsions: Secondary | ICD-10-CM | POA: Diagnosis present

## 2013-03-28 DIAGNOSIS — G936 Cerebral edema: Secondary | ICD-10-CM | POA: Diagnosis present

## 2013-03-28 DIAGNOSIS — R531 Weakness: Secondary | ICD-10-CM

## 2013-03-28 DIAGNOSIS — Z820 Family history of epilepsy and other diseases of the nervous system: Secondary | ICD-10-CM

## 2013-03-28 DIAGNOSIS — C7949 Secondary malignant neoplasm of other parts of nervous system: Principal | ICD-10-CM

## 2013-03-28 DIAGNOSIS — J4489 Other specified chronic obstructive pulmonary disease: Secondary | ICD-10-CM | POA: Diagnosis present

## 2013-03-28 DIAGNOSIS — R918 Other nonspecific abnormal finding of lung field: Secondary | ICD-10-CM

## 2013-03-28 DIAGNOSIS — K209 Esophagitis, unspecified without bleeding: Secondary | ICD-10-CM

## 2013-03-28 DIAGNOSIS — Z9981 Dependence on supplemental oxygen: Secondary | ICD-10-CM

## 2013-03-28 DIAGNOSIS — Z515 Encounter for palliative care: Secondary | ICD-10-CM

## 2013-03-28 DIAGNOSIS — D72829 Elevated white blood cell count, unspecified: Secondary | ICD-10-CM | POA: Diagnosis present

## 2013-03-28 DIAGNOSIS — E440 Moderate protein-calorie malnutrition: Secondary | ICD-10-CM

## 2013-03-28 DIAGNOSIS — R1310 Dysphagia, unspecified: Secondary | ICD-10-CM

## 2013-03-28 DIAGNOSIS — Z79899 Other long term (current) drug therapy: Secondary | ICD-10-CM

## 2013-03-28 DIAGNOSIS — T380X5A Adverse effect of glucocorticoids and synthetic analogues, initial encounter: Secondary | ICD-10-CM | POA: Diagnosis present

## 2013-03-28 DIAGNOSIS — Z716 Tobacco abuse counseling: Secondary | ICD-10-CM

## 2013-03-28 DIAGNOSIS — Z923 Personal history of irradiation: Secondary | ICD-10-CM

## 2013-03-28 DIAGNOSIS — F17200 Nicotine dependence, unspecified, uncomplicated: Secondary | ICD-10-CM | POA: Diagnosis present

## 2013-03-28 DIAGNOSIS — C7931 Secondary malignant neoplasm of brain: Principal | ICD-10-CM | POA: Diagnosis present

## 2013-03-28 DIAGNOSIS — Z8249 Family history of ischemic heart disease and other diseases of the circulatory system: Secondary | ICD-10-CM

## 2013-03-28 DIAGNOSIS — Z66 Do not resuscitate: Secondary | ICD-10-CM | POA: Diagnosis present

## 2013-03-28 DIAGNOSIS — J189 Pneumonia, unspecified organism: Secondary | ICD-10-CM

## 2013-03-28 DIAGNOSIS — R0902 Hypoxemia: Secondary | ICD-10-CM | POA: Diagnosis present

## 2013-03-28 DIAGNOSIS — J449 Chronic obstructive pulmonary disease, unspecified: Secondary | ICD-10-CM

## 2013-03-28 DIAGNOSIS — C3490 Malignant neoplasm of unspecified part of unspecified bronchus or lung: Secondary | ICD-10-CM | POA: Diagnosis present

## 2013-03-28 LAB — CBC WITH DIFFERENTIAL/PLATELET
BASOS ABS: 0 10*3/uL (ref 0.0–0.1)
Basophils Relative: 0 % (ref 0–1)
Eosinophils Absolute: 0.4 10*3/uL (ref 0.0–0.7)
Eosinophils Relative: 3 % (ref 0–5)
HCT: 37.5 % (ref 36.0–46.0)
Hemoglobin: 12.3 g/dL (ref 12.0–15.0)
LYMPHS ABS: 1.2 10*3/uL (ref 0.7–4.0)
LYMPHS PCT: 9 % — AB (ref 12–46)
MCH: 31.1 pg (ref 26.0–34.0)
MCHC: 32.8 g/dL (ref 30.0–36.0)
MCV: 94.7 fL (ref 78.0–100.0)
Monocytes Absolute: 0.5 10*3/uL (ref 0.1–1.0)
Monocytes Relative: 3 % (ref 3–12)
NEUTROS ABS: 11.9 10*3/uL — AB (ref 1.7–7.7)
Neutrophils Relative %: 85 % — ABNORMAL HIGH (ref 43–77)
PLATELETS: 250 10*3/uL (ref 150–400)
RBC: 3.96 MIL/uL (ref 3.87–5.11)
RDW: 15.2 % (ref 11.5–15.5)
WBC: 14 10*3/uL — AB (ref 4.0–10.5)

## 2013-03-28 LAB — BASIC METABOLIC PANEL
BUN: 13 mg/dL (ref 6–23)
CO2: 30 meq/L (ref 19–32)
Calcium: 9.3 mg/dL (ref 8.4–10.5)
Chloride: 93 mEq/L — ABNORMAL LOW (ref 96–112)
Creatinine, Ser: 0.7 mg/dL (ref 0.50–1.10)
GFR calc Af Amer: >90 mL/min (ref >90)
GLUCOSE: 95 mg/dL (ref 70–99)
POTASSIUM: 4 meq/L (ref 3.7–5.3)
Sodium: 135 mEq/L — ABNORMAL LOW (ref 137–147)

## 2013-03-28 LAB — POCT I-STAT, CHEM 8
BUN: 13 mg/dL (ref 6–23)
CHLORIDE: 94 meq/L — AB (ref 96–112)
Calcium, Ion: 1.23 mmol/L (ref 1.13–1.30)
Creatinine, Ser: 0.8 mg/dL (ref 0.50–1.10)
Glucose, Bld: 104 mg/dL — ABNORMAL HIGH (ref 70–99)
HCT: 39 % (ref 36.0–46.0)
Hemoglobin: 13.3 g/dL (ref 12.0–15.0)
Potassium: 3.6 mEq/L — ABNORMAL LOW (ref 3.7–5.3)
SODIUM: 135 meq/L — AB (ref 137–147)
TCO2: 32 mmol/L (ref 0–100)

## 2013-03-28 LAB — MRSA PCR SCREENING: MRSA by PCR: NEGATIVE

## 2013-03-28 MED ORDER — PIPERACILLIN-TAZOBACTAM 3.375 G IVPB
3.3750 g | Freq: Three times a day (TID) | INTRAVENOUS | Status: DC
  Administered 2013-03-28 – 2013-03-30 (×6): 3.375 g via INTRAVENOUS
  Filled 2013-03-28 (×8): qty 50

## 2013-03-28 MED ORDER — ACETAMINOPHEN 650 MG RE SUPP: 650.0000 mg | Freq: Four times a day (QID) | RECTAL | Status: DC | PRN

## 2013-03-28 MED ORDER — DEXAMETHASONE SODIUM PHOSPHATE 10 MG/ML IJ SOLN
8.0000 mg | Freq: Once | INTRAMUSCULAR | Status: AC
  Administered 2013-03-28: 8 mg via INTRAVENOUS
  Filled 2013-03-28: qty 1

## 2013-03-28 MED ORDER — SODIUM CHLORIDE 0.9 % IV SOLN
Freq: Once | INTRAVENOUS | Status: AC
  Administered 2013-03-28: 14:00:00 via INTRAVENOUS

## 2013-03-28 MED ORDER — ONDANSETRON HCL 4 MG/2ML IJ SOLN
4.0000 mg | Freq: Once | INTRAMUSCULAR | Status: AC
  Administered 2013-03-28: 4 mg via INTRAVENOUS
  Filled 2013-03-28: qty 2

## 2013-03-28 MED ORDER — SODIUM CHLORIDE 0.9 % IV BOLUS (SEPSIS)
1000.0000 mL | Freq: Once | INTRAVENOUS | Status: AC
  Administered 2013-03-28: 1000 mL via INTRAVENOUS

## 2013-03-28 MED ORDER — ENOXAPARIN SODIUM 40 MG/0.4ML ~~LOC~~ SOLN
40.0000 mg | SUBCUTANEOUS | Status: DC
  Administered 2013-03-28 – 2013-03-29 (×2): 40 mg via SUBCUTANEOUS
  Filled 2013-03-28 (×3): qty 0.4

## 2013-03-28 MED ORDER — IPRATROPIUM BROMIDE 0.02 % IN SOLN
500.0000 ug | Freq: Four times a day (QID) | RESPIRATORY_TRACT | Status: DC
  Administered 2013-03-28 – 2013-03-30 (×9): 500 ug via RESPIRATORY_TRACT
  Filled 2013-03-28 (×9): qty 2.5

## 2013-03-28 MED ORDER — ONDANSETRON HCL 4 MG PO TABS
4.0000 mg | ORAL_TABLET | Freq: Four times a day (QID) | ORAL | Status: DC | PRN
Start: 2013-03-28 — End: 2013-03-30

## 2013-03-28 MED ORDER — DEXAMETHASONE SODIUM PHOSPHATE 10 MG/ML IJ SOLN
4.0000 mg | Freq: Two times a day (BID) | INTRAMUSCULAR | Status: DC
  Administered 2013-03-28 – 2013-03-30 (×4): 4 mg via INTRAVENOUS
  Filled 2013-03-28 (×5): qty 0.4

## 2013-03-28 MED ORDER — PENTOXIFYLLINE ER 400 MG PO TBCR
400.0000 mg | EXTENDED_RELEASE_TABLET | Freq: Two times a day (BID) | ORAL | Status: DC
  Administered 2013-03-28 – 2013-03-30 (×5): 400 mg via ORAL
  Filled 2013-03-28 (×6): qty 1

## 2013-03-28 MED ORDER — ALBUTEROL SULFATE (2.5 MG/3ML) 0.083% IN NEBU
5.0000 mg | INHALATION_SOLUTION | Freq: Once | RESPIRATORY_TRACT | Status: AC
  Administered 2013-03-28: 5 mg via RESPIRATORY_TRACT
  Filled 2013-03-28: qty 6

## 2013-03-28 MED ORDER — ALBUTEROL SULFATE (2.5 MG/3ML) 0.083% IN NEBU
2.5000 mg | INHALATION_SOLUTION | RESPIRATORY_TRACT | Status: DC | PRN
  Administered 2013-03-29 – 2013-03-30 (×2): 2.5 mg via RESPIRATORY_TRACT
  Filled 2013-03-28 (×2): qty 3

## 2013-03-28 MED ORDER — PANTOPRAZOLE SODIUM 40 MG IV SOLR
40.0000 mg | Freq: Every day | INTRAVENOUS | Status: DC
  Administered 2013-03-28 – 2013-03-30 (×3): 40 mg via INTRAVENOUS
  Filled 2013-03-28 (×4): qty 40

## 2013-03-28 MED ORDER — OXYCODONE-ACETAMINOPHEN 5-325 MG PO TABS
1.0000 | ORAL_TABLET | ORAL | Status: DC | PRN
Start: 2013-03-28 — End: 2013-03-30
  Administered 2013-03-29 – 2013-03-30 (×3): 1 via ORAL
  Filled 2013-03-28 (×3): qty 1

## 2013-03-28 MED ORDER — SODIUM CHLORIDE 0.9 % IV SOLN
INTRAVENOUS | Status: DC
  Administered 2013-03-29: 1000 mL via INTRAVENOUS
  Administered 2013-03-30: 11:00:00 via INTRAVENOUS

## 2013-03-28 MED ORDER — ACETAMINOPHEN 325 MG PO TABS: 650.0000 mg | ORAL_TABLET | Freq: Four times a day (QID) | ORAL | Status: DC | PRN

## 2013-03-28 MED ORDER — ONDANSETRON HCL 4 MG/2ML IJ SOLN
4.0000 mg | Freq: Four times a day (QID) | INTRAMUSCULAR | Status: DC | PRN
  Administered 2013-03-29: 4 mg via INTRAVENOUS
  Filled 2013-03-28: qty 2

## 2013-03-28 MED ORDER — ALBUTEROL SULFATE (2.5 MG/3ML) 0.083% IN NEBU
INHALATION_SOLUTION | RESPIRATORY_TRACT | Status: AC
  Administered 2013-03-28: 5 mg via RESPIRATORY_TRACT
  Filled 2013-03-28: qty 6

## 2013-03-28 MED ORDER — LEVETIRACETAM 500 MG PO TABS
1500.0000 mg | ORAL_TABLET | Freq: Two times a day (BID) | ORAL | Status: DC
Start: 2013-03-28 — End: 2013-03-30
  Administered 2013-03-28 – 2013-03-30 (×4): 1500 mg via ORAL
  Filled 2013-03-28 (×5): qty 3

## 2013-03-28 MED ORDER — SODIUM CHLORIDE 0.9 % IJ SOLN
3.0000 mL | Freq: Two times a day (BID) | INTRAMUSCULAR | Status: DC
  Administered 2013-03-28 – 2013-03-29 (×3): 3 mL via INTRAVENOUS
  Administered 2013-03-30: 10:00:00 via INTRAVENOUS

## 2013-03-28 NOTE — ED Notes (Signed)
Bed: YS06 Expected date: 03/28/13 Expected time: 9:13 AM Means of arrival:  Comments: Ca pt, vomiting

## 2013-03-28 NOTE — ED Notes (Signed)
Patient transported to CT 

## 2013-03-28 NOTE — Progress Notes (Signed)
ANTIBIOTIC CONSULT NOTE - INITIAL  Pharmacy Consult for Zosyn  Indication: suspected aspiration pneumonia  Allergies  Allergen Reactions  . Carboplatin Shortness Of Breath  . Levaquin [Levofloxacin In D5w] Rash    itching    Patient Measurements: Height: 5' 2.99" (160 cm) Weight: 108 lb 11 oz (49.3 kg) IBW/kg (Calculated) : 52.38  Vital Signs: Temp: 98.3 F (36.8 C) (01/24 0927) Temp src: Oral (01/24 0927) BP: 102/67 mmHg (01/24 1326) Pulse Rate: 135 (01/24 1326)  Labs:  Recent Labs  03/28/13 1030 03/28/13 1043  WBC 14.0*  --   HGB 12.3 13.3  PLT 250  --   CREATININE  --  0.80   Estimated Creatinine Clearance: 58.2 ml/min (by C-G formula based on Cr of 0.8).   Medical History: Past Medical History  Diagnosis Date  . Nodule of right lung     right upper lobe  . Anemia   . Rectovaginal fistula   . Right frontal lobe lesion 12/13/11    CT head  . History of radiation therapy 12/24/11-02/08/12    rul 66Gy/74fxs  . Dysphagia   . History of radiation therapy 12/27/11    SRS rt ant frontal 20gy  . Lung cancer 11/23/11    biopsy-right  . Brain metastases   . Chest pain at rest 08/26/2012  . COPD (chronic obstructive pulmonary disease)   . Shortness of breath     Medications:  Scheduled:  . dexamethasone  4 mg Intravenous Q12H  . enoxaparin (LOVENOX) injection  40 mg Subcutaneous Q24H  . ipratropium  500 mcg Nebulization QID  . levETIRAcetam  1,500 mg Oral BID  . pantoprazole (PROTONIX) IV  40 mg Intravenous Daily  . pentoxifylline  400 mg Oral BID WC  . sodium chloride  3 mL Intravenous Q12H   Infusions:  . sodium chloride     Assessment: 62 yoF admitted 1/24 with headaches, vomiting, and weakness. Pt has hx of brain mets from lung cancer and recently hospitalized 1/1-1/14 for worsening vasogenic edema and currently on decadron. Pharmacy has been consulted to dose Zosyn for suspected aspiration pneumonia.   Antiinfectives 1/24 >> Zosyn >>   Tmax:  afebrile WBCs: elevated to 14.0 Renal: SCr 0.8 (baseline appears 0.7-1.0), CrCl 58 ml/min CG, 85 ml/min normalized  Microbiology No cultures yet this admission   Goal of Therapy:  eradication of infection  Plan:  - Zosyn 3.375G IV q8h to be infused over 4 hours - follow-up clinical course, renal function - follow-up antibiotic de-escalation and length of therapy  Thank you for the consult.  Johny Drilling, PharmD, BCPS Pager: [phone] Pharmacy: [phone] 03/28/2013 4:37 PM

## 2013-03-28 NOTE — ED Provider Notes (Addendum)
CSN: [id number]     Arrival date & time 03/28/13  8299 History   First MD Initiated Contact with Patient 03/28/13 [phone]     Chief Complaint  Patient presents with  . Headache  . Emesis  . Weakness   (Consider location/radiation/quality/duration/timing/severity/associated sxs/prior Treatment) Patient is a 63 y.o. female presenting with headaches, vomiting, and weakness. The history is provided by the patient.  Headache Pain location:  Frontal Quality:  Dull Radiates to:  Does not radiate Severity currently:  6/10 Severity at highest:  6/10 Onset quality:  Gradual Duration:  12 hours Timing:  Constant Progression:  Unchanged Chronicity:  Recurrent Similar to prior headaches: yes   Context: not exposure to bright light and not loud noise   Relieved by:  None tried Worsened by:  Nothing tried Ineffective treatments:  None tried Associated symptoms: cough, fatigue, focal weakness, nausea, vomiting and weakness   Associated symptoms: no blurred vision   Associated symptoms comment:  Pt has hx of brain mets from lung ca and recently hospitalized for worsening vasogenic edema and currently on decadron.  Pt hs not complained of headache since being home from hospital for the last 1 week until last night.  At [place] yesterday she called her family and told them she felt like she was [place] have a sz and took 3 ativan but pt never had any sz like activity.  She has been very drowsy but arousable.  She has not missed any medication doses but noticed today her left arm was weak and also she could not ambulate due to generalized weakness in the legs. Emesis Associated symptoms: headaches   Weakness Associated symptoms include headaches.    Past Medical History  Diagnosis Date  . Nodule of right lung     right upper lobe  . Anemia   . Rectovaginal fistula   . Right frontal lobe lesion 12/13/11    CT head  . History of radiation therapy 12/24/11-02/08/12    rul 66Gy/47fxs  . Dysphagia   .  History of radiation therapy 12/27/11    SRS rt ant frontal 20gy  . Lung cancer 11/23/11    biopsy-right  . Brain metastases   . Chest pain at rest 08/26/2012  . COPD (chronic obstructive pulmonary disease)   . Shortness of breath    Past Surgical History  Procedure Laterality Date  . Partial hysterectomy    . Hemmorhoid surgery    . Neck surgery    . Retinal detachment surgery    . Esophagogastroduodenoscopy N/A 05/01/2012    Procedure: ESOPHAGOGASTRODUODENOSCOPY (EGD);  Surgeon: Lear Ng, MD;  Location: [facility];  Service: Endoscopy;  Laterality: N/A;  . Subxyphoid pericardial window N/A 08/28/2012    Procedure: SUBXYPHOID PERICARDIAL WINDOW;  Surgeon: Ivin Poot, MD;  Location: [facility] OR;  Service: Thoracic;  Laterality: N/A;  . Tee without cardioversion  08/28/2012    Procedure: TRANSESOPHAGEAL ECHOCARDIOGRAM (TEE);  Surgeon: Ivin Poot, MD;  Location: [facility];  Service: Thoracic;;  . Eye surgery    . Tubal ligation    . Mass excision Right 09/26/2012    Procedure: EXCISION MASS;  Surgeon: Ivin Poot, MD;  Location: [facility] OR;  Service: Vascular;  Laterality: Right;  EXCISION OF RIGHT NECK  MASS  . Abdominal hysterectomy  1988    [facility] - Dr. Daivd Council   Family History  Problem Relation Age of Onset  . Heart disease Father   . Kidney disease Mother   . Alzheimer's  disease Sister   . Alzheimer's disease Brother    History  Substance Use Topics  . Smoking status: Current Every Day Smoker -- 0.50 packs/day for 43 years    Types: Cigarettes  . Smokeless tobacco: Never Used  . Alcohol Use: No   OB History   Grav Para Term Preterm Abortions TAB SAB Ect Mult Living                 Review of Systems  Constitutional: Positive for fatigue.  Eyes: Negative for blurred vision.  Respiratory: Positive for cough.   Gastrointestinal: Positive for nausea and vomiting.       1 episode of vomiting this am  Neurological: Positive for focal weakness, weakness and  headaches. Negative for speech difficulty.  All other systems reviewed and are negative.    Allergies  Carboplatin and Levaquin  Home Medications   Current Outpatient Rx  Name  Route  Sig  Dispense  Refill  . albuterol (PROVENTIL) (5 MG/ML) 0.5% nebulizer solution   Nebulization   Take 2.5 mg by nebulization every 6 (six) hours as needed for wheezing.         [name] albuterol (PROVENTIL) (5 MG/ML) 0.5% nebulizer solution   Nebulization   Take 0.5 mLs (2.5 mg total) by nebulization every 6 (six) hours as needed for wheezing.   20 mL   12   . dexamethasone (DECADRON) 4 MG tablet   Oral   Take 1 tablet (4 mg total) by mouth 2 (two) times daily. Take half pill (2mg ) twice daily for one week, then half daily (2mg ) once daily for one week then stop.   40 tablet   1     Take 4 mg 3 times daily for 4 days, then 4 mg 2 ti ...   . ipratropium (ATROVENT) 0.02 % nebulizer solution   Nebulization   Take 500 mcg by nebulization every 4 (four) hours as needed for wheezing or shortness of breath.          [name] ipratropium (ATROVENT) 0.02 % nebulizer solution   Nebulization   Take 2.5 mLs (500 mcg total) by nebulization every 4 (four) hours as needed for wheezing or shortness of breath.   75 mL   12   . levETIRAcetam (KEPPRA) 1000 MG tablet   Oral   Take 1.5 tablets (1,500 mg total) by mouth 2 (two) times daily.   60 tablet   2   . LORazepam (ATIVAN) 1 MG tablet   Sublingual   Place 1 tablet (1 mg total) under the tongue every 8 (eight) hours as needed for anxiety or seizure.   30 tablet   0   . oxyCODONE-acetaminophen (PERCOCET/ROXICET) 5-325 MG per tablet   Oral   Take 1 tablet by mouth every 4 (four) hours as needed for moderate pain or severe pain.   45 tablet   0   . pentoxifylline (TRENTAL) 400 MG CR tablet   Oral   Take 1 tablet (400 mg total) by mouth 2 (two) times daily with a meal.   60 tablet   50   . prochlorperazine (COMPAZINE) 10 MG tablet   Oral   Take 10  mg by mouth every 6 (six) hours as needed (nausea).          . promethazine (PHENERGAN) 25 MG tablet   Oral   Take 1 tablet (25 mg total) by mouth every 6 (six) hours as needed for nausea.   30 tablet   1   .  ranitidine (ZANTAC) 150 MG capsule   Oral   Take 150 mg by mouth at bedtime.         . vitamin E (VITAMIN E) 400 UNIT capsule   Oral   Take 1 capsule (400 Units total) by mouth 2 (two) times daily.   60 capsule   5    BP 115/84  Pulse 100  Temp(Src) 98.3 F (36.8 C) (Oral)  Resp 20  SpO2 97% Physical Exam  Nursing note and vitals reviewed. Constitutional: She is oriented to person, place, and time. She appears well-developed and well-nourished. No distress.  HENT:  Head: Normocephalic and atraumatic.  Mouth/Throat: Oropharynx is clear and moist. Mucous membranes are dry.  Eyes: Conjunctivae and EOM are normal. Pupils are equal, round, and reactive to light.  Neck: Normal range of motion. Neck supple.  Cardiovascular: Regular rhythm and intact distal pulses.  Tachycardia present.   No murmur heard. Pulmonary/Chest: Effort normal. No respiratory distress. She has no wheezes. She has rhonchi. She has no rales.  Mild diffuse rhonchi in both lobes  Abdominal: Soft. She exhibits no distension. There is no tenderness. There is no rebound and no guarding.  Musculoskeletal: Normal range of motion. She exhibits no edema and no tenderness.  Neurological: She is oriented to person, place, and time. No cranial nerve deficit or sensory deficit.  Drowsy but arouses to voice.  3/5 strength in the left upper ext compared to right.  4/5 strength in bilateral lower ext.  Skin: Skin is warm and dry. No rash noted. No erythema.  Psychiatric: She has a normal mood and affect. Her behavior is normal.    ED Course  Procedures (including critical care time) Labs Review Labs Reviewed  CBC WITH DIFFERENTIAL - Abnormal; Notable for the following:    WBC 14.0 (*)    Neutrophils  Relative % 85 (*)    Neutro Abs 11.9 (*)    Lymphocytes Relative 9 (*)    All other components within normal limits  POCT I-STAT, CHEM 8 - Abnormal; Notable for the following:    Sodium 135 (*)    Potassium 3.6 (*)    Chloride 94 (*)    Glucose, Bld 104 (*)    All other components within normal limits   Imaging Review Dg Chest 2 View  03/28/2013   CLINICAL DATA:  Unresponsive week patient. Headache. Vomiting. History of lung cancer.  EXAM: CHEST  2 VIEW  COMPARISON:  Chest x-ray 01/08/2013.  FINDINGS: Opacity in the apex of the right hemithorax, compatible with a combination of underlying mass, postradiation changes and chronic right apical loculated pleural effusion, similar to the prior study. Lungs are otherwise well aerated. No left pleural effusion. No evidence of pulmonary edema. Heart size is normal. Mediastinal contours are distorted related to patient rotation to the right, and chronic postradiation changes resulting in volume loss in the right hemithorax and shift of cardiomediastinal structures to the right. Orthopedic fixation hardware in the lower cervical spine.  IMPRESSION: 1. Overall, the radiographic appearance the chest is very similar to the prior study, as detailed above. No definite acute cardiopulmonary disease is noted.   Electronically Signed   By: Vinnie Langton M.D.   On: 03/28/2013 10:28   Ct Head Wo Contrast  03/28/2013   ADDENDUM REPORT: 03/28/2013 10:47   Electronically Signed   By: Chauncey Cruel M.D.   On: 03/28/2013 10:47   03/28/2013   CLINICAL DATA:  Headache with nausea and vomiting.  Lung cancer.  EXAM: CT HEAD WITHOUT CONTRAST  TECHNIQUE: Contiguous axial images were obtained from the base of the skull through the vertex without intravenous contrast.  COMPARISON:  03/18/2013 and 03/05/2013 unenhanced head CT and contrast-enhanced MR 01/07/2013 and 10/28/2012.  FINDINGS: Right superior parietal lobe 2 cm mass measured 1.5 cm on 03/05/2013.  Right posterior  frontal -parietal lobe hyperdense mass measures 2.3 cm and measured 1.5 cm on 03/05/2013.  Anterior right subfrontal 9 mm mass barely perceptible on the 03/05/2013 exam.  Significant vasogenic edema throughout the right parietal and frontal lobe with mass effect upon the lateral ventricle with 8.4 mm midline shift to the left versus 7.9 mm on the 03/18/2013 exam. Mild/early trapping of the left lateral ventricle the may be present. Vasogenic edema extends into the base ganglia. Less notable vasogenic edema surrounding the were anterior right subfrontal lesion.  The metastatic lesions are hyperdense but without frank hemorrhage.  No acute thrombotic infarct seen separate from the above described findings.  IMPRESSION: Three intracranial metastatic lesions with surrounding vasogenic edema as detailed above. The metastatic lesions have increased in size from 03/05/2013 exam and vasogenic edema with midline shift to the left slightly more prominent than on the 03/18/2013 exam.  Please see above for further detail.  These results were called by telephone at the time of interpretation on 03/28/2013 at 10:36 AM to Dr. Blanchie Dessert , who verbally acknowledged these results.  Electronically Signed: By: Chauncey Cruel M.D. On: 03/28/2013 10:37    EKG Interpretation   None       MDM   1. Brain metastases   2. Vasogenic brain edema     Patient presents from home complaining of a headache that started approximately 12 hours ago with one episode of vomiting this morning. Per family patient has a history of lung cancer with brain metastases and was recently discharged one week ago on a Decadron taper for vasogenic edema and mild shift. Patient had been doing well without complaints of headache until yesterday. At 9 AM she called her family member and told him she thought she may have a seizure she took 3 mg of Ativan within 30 minutes without any visualized seizure activity since that time she's been very groggy  and drowsy. She has not eaten in the last 24 hours and has been very weak. Also family noted some new weakness on the left arm. No fever or infectious symptoms.  Patient will answer questions appropriately and follows commands but notable weakness in the left upper extremity. Lower extremities have equal strength. Pupils are reactive.  Patient's mucous membranes are dry and appears mildly dehydrated. Patient has not missed any medications.  Patient is very drowsy on exam and will answer questions and then falls back asleep. Concern for worsening edema versus new bleed causing patient's headache and left-sided deficits. No evidence of seizure activity on exam and family was with patient for the last 24 hours and has not noted any seizure activity. Drowsiness may be coming from the 3 mg of Ativan that the patient took or other etiology. Patient also has a cough on exam but denies shortness of breath.  CBC, i-STAT, head CT, chest x-ray pending for further evaluation.  11:13 AM Labs unchanged however speaking with radiology Head CT with worsening edema and shift than 1/14.  Will discuss with oncology and hospitalist.  Pt most likely will need increased decadron.  Spoke with family and they are going to get signed up with hospice but have not currently  sought their services.  11:47 AM Oncology recommended going back to IV at the equivalent of 8mg  oral.  Recommended 24hrs obs.   1:00 PM Pt had episode of vomiting and aspiration with initial decreased sats in the 70's.  On NRB sats in the high 90's and some improvement with albuterol.  Blanchie Dessert, MD 03/28/13 1114  Blanchie Dessert, MD 03/28/13 [place], MD 03/28/13 1300

## 2013-03-28 NOTE — ED Notes (Signed)
Pt from home vis [facility] c/o headache starting this a.m.Melanie Liu She is also endorsing nausea, vomiting and weakness. Hx of Lung Ca that metastasis to brain. Son is POA.

## 2013-03-28 NOTE — H&P (Signed)
[facility] History and Physical  Melanie Liu HYQ:[id number] DOB: Sep 22, 1950 DOA: 03/28/2013  Referring physician: EDP PCP: Sherrie Mustache, MD   Chief Complaint: Headaches with nausea vomiting  HPI: Melanie Liu is a 63 y.o. female with history of lung cancer with brain metastases and known vasogenic edema last hospitalized 1/14 with seizures as she was tapering her decadron, as well as history of COPD who presents with above complaints. History is obtained from EDP as well as Family at the bedside some contribution from patient. It is reported that patient had been doing well since her last discharge until yesterday when family reports that she had called and stated that she felt she might have a seizure and so had taken 3 mg of Ativan. Following that she mostly slept and last night she headaches which persisted through this morning and she also developed nausea vomiting and so was brought to the ED. She also was feeling weak -more in her left upper extremity, but also in her legs bilaterally. Patient also reported that she had been tapering her Decadron and was down to 4 mg a day(from 8mg /day). She denies abdominal pain, diarrhea, dysuria. In the ED a CT scan of the brain was done that showed 3 intracranial metastatic lesions with surrounding vasogenic edema-and it was noted that the metastatic lesions had increased in size from the 1/1 CT and vasogenic edema with midline shift to the left more prominent than on the 1/14 exam. Oncology was consulted and Dr. Juliann Mule recommended 8 mg IV Decadron x1 followed by 4 IV every 12. While in the ED patient again vomited and following that developed respiratory distress with wheezing on exam-was hypoxic requiring a nonrebreather mask. Patient admits to none productive cough for the past 4 days, denies fevers. EDP discussed end-of-life issues with family and patient's wishes are to be DO NOT RESUSCITATE and family stated that they had suggested  palliative care but patient had not decided on it yet.    Review of Systems As per HPI, otherwise unobtainable   Past Medical History  Diagnosis Date  . Nodule of right lung     right upper lobe  . Anemia   . Rectovaginal fistula   . Right frontal lobe lesion 12/13/11    CT head  . History of radiation therapy 12/24/11-02/08/12    rul 66Gy/8fxs  . Dysphagia   . History of radiation therapy 12/27/11    SRS rt ant frontal 20gy  . Lung cancer 11/23/11    biopsy-right  . Brain metastases   . Chest pain at rest 08/26/2012  . COPD (chronic obstructive pulmonary disease)   . Shortness of breath    Past Surgical History  Procedure Laterality Date  . Partial hysterectomy    . Hemmorhoid surgery    . Neck surgery    . Retinal detachment surgery    . Esophagogastroduodenoscopy N/A 05/01/2012    Procedure: ESOPHAGOGASTRODUODENOSCOPY (EGD);  Surgeon: Lear Ng, MD;  Location: [facility];  Service: Endoscopy;  Laterality: N/A;  . Subxyphoid pericardial window N/A 08/28/2012    Procedure: SUBXYPHOID PERICARDIAL WINDOW;  Surgeon: Ivin Poot, MD;  Location: [facility] OR;  Service: Thoracic;  Laterality: N/A;  . Tee without cardioversion  08/28/2012    Procedure: TRANSESOPHAGEAL ECHOCARDIOGRAM (TEE);  Surgeon: Ivin Poot, MD;  Location: [facility];  Service: Thoracic;;  . Eye surgery    . Tubal ligation    . Mass excision Right 09/26/2012    Procedure: EXCISION MASS;  Surgeon: Ivin Poot, MD;  Location: [facility] OR;  Service: Vascular;  Laterality: Right;  EXCISION OF RIGHT NECK  MASS  . Abdominal hysterectomy  1988    [facility] - Dr. Daivd Council   Social History:  reports that she has been smoking Cigarettes.  She has a 21.5 pack-year smoking history. She has never used smokeless tobacco. She reports that she does not drink alcohol or use illicit drugs.  Allergies  Allergen Reactions  . Carboplatin Shortness Of Breath  . Levaquin [Levofloxacin In D5w] Rash    itching    Family  History  Problem Relation Age of Onset  . Heart disease Father   . Kidney disease Mother   . Alzheimer's disease Sister   . Alzheimer's disease Brother      Prior to Admission medications   Medication Sig Start Date End Date Taking? Authorizing Provider  albuterol (PROVENTIL) (5 MG/ML) 0.5% nebulizer solution Take 2.5 mg by nebulization every 6 (six) hours as needed for wheezing.   Yes Historical Provider, MD  dexamethasone (DECADRON) 4 MG tablet Take 4 mg by mouth 2 (two) times daily with a meal.   Yes Historical Provider, MD  levETIRAcetam (KEPPRA) 1000 MG tablet Take 1.5 tablets (1,500 mg total) by mouth 2 (two) times daily. 03/20/13  Yes Nishant Dhungel, MD  LORazepam (ATIVAN) 1 MG tablet Place 1 tablet (1 mg total) under the tongue every 8 (eight) hours as needed for anxiety or seizure. 03/06/13  Yes Robbie Lis, MD  oxyCODONE-acetaminophen (PERCOCET/ROXICET) 5-325 MG per tablet Take 1 tablet by mouth every 4 (four) hours as needed for moderate pain or severe pain. 03/06/13  Yes Robbie Lis, MD  pentoxifylline (TRENTAL) 400 MG CR tablet Take 1 tablet (400 mg total) by mouth 2 (two) times daily with a meal. 03/20/13  Yes Nishant Dhungel, MD  prochlorperazine (COMPAZINE) 10 MG tablet Take 10 mg by mouth every 6 (six) hours as needed (nausea).    Yes Historical Provider, MD  promethazine (PHENERGAN) 25 MG tablet Take 1 tablet (25 mg total) by mouth every 6 (six) hours as needed for nausea. 01/13/13  Yes Adrena E Johnson, PA-C  ranitidine (ZANTAC) 150 MG capsule Take 150 mg by mouth 2 (two) times daily.    Yes Historical Provider, MD  vitamin E (VITAMIN E) 400 UNIT capsule Take 1 capsule (400 Units total) by mouth 2 (two) times daily. 10/29/12  Yes Lora Paula, MD  ipratropium (ATROVENT) 0.02 % nebulizer solution Take 500 mcg by nebulization every 4 (four) hours as needed for wheezing or shortness of breath.     Historical Provider, MD   Physical Exam: Filed Vitals:   03/28/13 1326  BP:  102/67  Pulse: 135  Temp:   Resp: 31    BP 102/67  Pulse 135  Temp(Src) 98.3 F (36.8 C) (Oral)  Resp 31  SpO2 100% Constitutional: Vital signs reviewed.  Patient is a thin-appearing well-developed in no acute distress with nonrebreather mask on  and cooperative with exam.  lethargic, easily arouses and answers simple questions appropriately Head: Normocephalic and atraumatic Mouth: no erythema or exudates, dry MM Eyes: PERRL, EOMI, conjunctivae normal, No scleral icterus.  Neck: Supple, Trachea midline normal ROM, No JVD, mass, thyromegaly, or carotid bruit present.  Cardiovascular: RRR, S1 normal, S2 normal, no MRG, pulses symmetric and intact bilaterally Pulmonary/Chest: Scattered rhonchi bilaterally, no wheezes, rales, non labored respirations Abdominal: Soft. Non-tender, non-distended, bowel sounds are normal, no masses, organomegaly, or guarding present.  GU: no CVA tenderness  extremities: No cyanosis and no edema  Hematology: no cervical, inginal, or axillary adenopathy.  Neurological: Lethargic, arouses to voice  cranial nerve II-XII are grossly intact, moves extremities sensory grossly grossly intact  .  Skin: Warm, dry and intact. No rash.  Psychiatric: Normal mood and affect. s             Labs on Admission:  Basic Metabolic Panel:  Recent Labs Lab 03/28/13 1043  NA 135*  K 3.6*  CL 94*  GLUCOSE 104*  BUN 13  CREATININE 0.80   Liver Function Tests: No results found for this basename: AST, ALT, ALKPHOS, BILITOT, PROT, ALBUMIN,  in the last 168 hours No results found for this basename: LIPASE, AMYLASE,  in the last 168 hours No results found for this basename: AMMONIA,  in the last 168 hours CBC:  Recent Labs Lab 03/28/13 1030 03/28/13 1043  WBC 14.0*  --   NEUTROABS 11.9*  --   HGB 12.3 13.3  HCT 37.5 39.0  MCV 94.7  --   PLT 250  --    Cardiac Enzymes: No results found for this basename: CKTOTAL, CKMB, CKMBINDEX, TROPONINI,  in the last 168  hours  BNP (last 3 results)  Recent Labs  08/27/12 0200  PROBNP 635.4*   CBG: No results found for this basename: GLUCAP,  in the last 168 hours  Radiological Exams on Admission: Dg Chest 2 View  03/28/2013   CLINICAL DATA:  Unresponsive week patient. Headache. Vomiting. History of lung cancer.  EXAM: CHEST  2 VIEW  COMPARISON:  Chest x-ray 01/08/2013.  FINDINGS: Opacity in the apex of the right hemithorax, compatible with a combination of underlying mass, postradiation changes and chronic right apical loculated pleural effusion, similar to the prior study. Lungs are otherwise well aerated. No left pleural effusion. No evidence of pulmonary edema. Heart size is normal. Mediastinal contours are distorted related to patient rotation to the right, and chronic postradiation changes resulting in volume loss in the right hemithorax and shift of cardiomediastinal structures to the right. Orthopedic fixation hardware in the lower cervical spine.  IMPRESSION: 1. Overall, the radiographic appearance the chest is very similar to the prior study, as detailed above. No definite acute cardiopulmonary disease is noted.   Electronically Signed   By: Vinnie Langton M.D.   On: 03/28/2013 10:28   Ct Head Wo Contrast  03/28/2013   ADDENDUM REPORT: 03/28/2013 10:47   Electronically Signed   By: Chauncey Cruel M.D.   On: 03/28/2013 10:47   03/28/2013   CLINICAL DATA:  Headache with nausea and vomiting.  Lung cancer.  EXAM: CT HEAD WITHOUT CONTRAST  TECHNIQUE: Contiguous axial images were obtained from the base of the skull through the vertex without intravenous contrast.  COMPARISON:  03/18/2013 and 03/05/2013 unenhanced head CT and contrast-enhanced MR 01/07/2013 and 10/28/2012.  FINDINGS: Right superior parietal lobe 2 cm mass measured 1.5 cm on 03/05/2013.  Right posterior frontal -parietal lobe hyperdense mass measures 2.3 cm and measured 1.5 cm on 03/05/2013.  Anterior right subfrontal 9 mm mass barely perceptible  on the 03/05/2013 exam.  Significant vasogenic edema throughout the right parietal and frontal lobe with mass effect upon the lateral ventricle with 8.4 mm midline shift to the left versus 7.9 mm on the 03/18/2013 exam. Mild/early trapping of the left lateral ventricle the may be present. Vasogenic edema extends into the base ganglia. Less notable vasogenic edema surrounding the were anterior right subfrontal lesion.  The metastatic lesions are hyperdense but without frank hemorrhage.  No acute thrombotic infarct seen separate from the above described findings.  IMPRESSION: Three intracranial metastatic lesions with surrounding vasogenic edema as detailed above. The metastatic lesions have increased in size from 03/05/2013 exam and vasogenic edema with midline shift to the left slightly more prominent than on the 03/18/2013 exam.  Please see above for further detail.  These results were called by telephone at the time of interpretation on 03/28/2013 at 10:36 AM to Dr. Blanchie Dessert , who verbally acknowledged these results.  Electronically Signed: By: Chauncey Cruel M.D. On: 03/28/2013 10:37     Assessment/Plan Active Problems:  Lung cancer with brain metastasis Vasogenic brain edema -As discussed above, oncology consulted per EDP as above we'll continue Decadron as per Dr. Boyce Medici recommendations -Will add Dr. Julien Nordmann to his epic consult  List -Follow up on patient's our decision on? Consulting palliative care when she is more alert  Probable aspiration pneumonia PNA (pneumonia) with hypoxia -As above she developed respiratory distress with hypoxia in the ED after vomiting(above chest x-ray was done prior to this episode) -Will place on empiric Zosyn to cover for possible aspiration pneumonia -Well and recheck x-ray in 75 COPD (chronic obstructive pulmonary disease) -Continue nebulized bronchodilators Leukocytosis -Likely secondary to steroids, also possible pneumonia might be contributing   Seizure -Continue Keppra      Code Status: DNR Family Communication: Sister at bedside Disposition Plan: admit to step down  Time spent: >30  [facility] Pager [phone]

## 2013-03-28 NOTE — ED Notes (Signed)
Pt breathing better after breathing tx. Trialed pt on 6L [place], o2 sat 85%. MD informed. Pt put back in NRB

## 2013-03-28 NOTE — ED Notes (Signed)
MD at bedside. 

## 2013-03-28 NOTE — ED Notes (Signed)
Pt had episode of emesis. Pt afterwords became sob. Pt's mouth suctioned. Expiratory wheezing and rhonchi noted. Pt put on 6L [place], o2 sat increased from 78 to 84%. Put pt on NRB. EDP informed. Albuterol ordered.

## 2013-03-29 ENCOUNTER — Inpatient Hospital Stay (HOSPITAL_COMMUNITY): Payer: Medicaid Other

## 2013-03-29 DIAGNOSIS — R1310 Dysphagia, unspecified: Secondary | ICD-10-CM

## 2013-03-29 LAB — BASIC METABOLIC PANEL
BUN: 14 mg/dL (ref 6–23)
CHLORIDE: 100 meq/L (ref 96–112)
CO2: 27 meq/L (ref 19–32)
Calcium: 9.1 mg/dL (ref 8.4–10.5)
Creatinine, Ser: 0.64 mg/dL (ref 0.50–1.10)
GFR calc Af Amer: >90 mL/min (ref >90)
GFR calc non Af Amer: >90 mL/min (ref >90)
Glucose, Bld: 137 mg/dL — ABNORMAL HIGH (ref 70–99)
Potassium: 4.4 mEq/L (ref 3.7–5.3)
Sodium: 140 mEq/L (ref 137–147)

## 2013-03-29 MED ORDER — MAGIC MOUTHWASH
5.0000 mL | Freq: Two times a day (BID) | ORAL | Status: DC
Start: 2013-03-29 — End: 2013-03-30
  Administered 2013-03-29 – 2013-03-30 (×2): 5 mL via ORAL
  Filled 2013-03-29 (×3): qty 5

## 2013-03-29 MED ORDER — BIOTENE DRY MOUTH MT LIQD
15.0000 mL | Freq: Two times a day (BID) | OROMUCOSAL | Status: DC
  Administered 2013-03-29 – 2013-03-30 (×3): 15 mL via OROMUCOSAL

## 2013-03-29 NOTE — Progress Notes (Signed)
[facility] PROGRESS NOTE  Melanie Liu MRN:[id number] DOB: 04/20/1950 DOA: 03/28/2013 PCP: NYLAND,LEONARD ROBERT, MD  Assessment/Plan: Lung cancer with brain metastasis Vasogenic brain edema  -continue Decadron as per Dr. Chism's recommendations  -added to  Dr. Mohamed on epic consult List  -Patient clinically improved today -Will consult PT OT -Discussed palliative/hospice consultation while in the hospital and patient declines stating that she met with them during her last hospitalization  Resp distress with hypoxia  -As above she developed respiratory distress with hypoxia in the ED after vomiting(above chest x-ray was done prior to this episode)  -Followup chest x-ray this morning shows no acute infiltrates with stable right apical pleural and parenchymal opacity and patient oxygenating well on 2 L nasal cannula which she states she is on at home as well -Will DC empiric antibiotics and monitor her overnight for fever COPD (chronic obstructive pulmonary disease)  -Continue nebulized bronchodilators  Leukocytosis  -Likely secondary to steroids, as noted above no evidence of acute infiltrates/pna on repeat x-ray Seizure  -Continue Keppra   Code Status: DNR Family Communication: none at bedside  Disposition Plan: transfer to [facility]   Consultants:  onc consulted per ED  Procedures:  None  Antibiotics:  ZOSYN 1/24  HPI/Subjective: Looks much better this a.m. Patient and oriented x3 today. still some weakness in her left upper extremity and legs. No Headaches reported she denies any complaints this a.m.  Objective: Filed Vitals:   03/29/13 1100  BP:   Pulse: 94  Temp:   Resp: 19    Intake/Output Summary (Last 24 hours) at 03/29/13 1259 Last data filed at 03/29/13 1100  Gross per 24 hour  Intake 1693.75 ml  Output   1600 ml  Net  93.75 ml   Filed Weights   03/28/13 1500  Weight: 49.3 kg (108 lb 11 oz)    Exam:  General: alert & oriented x 3 In  NAD Cardiovascular: RRR, nl S1 s2 Respiratory: CTAB Abdomen: soft +BS NT/ND, no masses palpable Extremities: No cyanosis and no edema Neuro: alert &Oriented x3 strength-Right extremity and lower extremities 4+/5, and left upper extremity 4/5    Data Reviewed: Basic Metabolic Panel:  Recent Labs Lab 03/28/13 1030 03/28/13 1043 03/29/13 0355  NA 135* 135* 140  K 4.0 3.6* 4.4  CL 93* 94* 100  CO2 30  --  27  GLUCOSE 95 104* 137*  BUN 13 13 14  CREATININE 0.70 0.80 0.64  CALCIUM 9.3  --  9.1   Liver Function Tests: No results found for this basename: AST, ALT, ALKPHOS, BILITOT, PROT, ALBUMIN,  in the last 168 hours No results found for this basename: LIPASE, AMYLASE,  in the last 168 hours No results found for this basename: AMMONIA,  in the last 168 hours CBC:  Recent Labs Lab 03/28/13 1030 03/28/13 1043  WBC 14.0*  --   NEUTROABS 11.9*  --   HGB 12.3 13.3  HCT 37.5 39.0  MCV 94.7  --   PLT 250  --    Cardiac Enzymes: No results found for this basename: CKTOTAL, CKMB, CKMBINDEX, TROPONINI,  in the last 168 hours BNP (last 3 results)  Recent Labs  08/27/12 0200  PROBNP 635.4*   CBG: No results found for this basename: GLUCAP,  in the last 168 hours  Recent Results (from the past 240 hour(s))  MRSA PCR SCREENING     Status: None   Collection Time    03/28/13  3:11 PM        Result Value Range Status   MRSA by PCR NEGATIVE  NEGATIVE Final   Comment:            The GeneXpert MRSA Assay (FDA     approved for NASAL specimens     only), is one component of a     comprehensive MRSA colonization     surveillance program. It is not     intended to diagnose MRSA     infection nor to guide or     monitor treatment for     MRSA infections.     Studies: Dg Chest 2 View  03/28/2013   CLINICAL DATA:  Unresponsive week patient. Headache. Vomiting. History of lung cancer.  EXAM: CHEST  2 VIEW  COMPARISON:  Chest x-ray 01/08/2013.  FINDINGS: Opacity in the apex of  the right hemithorax, compatible with a combination of underlying mass, postradiation changes and chronic right apical loculated pleural effusion, similar to the prior study. Lungs are otherwise well aerated. No left pleural effusion. No evidence of pulmonary edema. Heart size is normal. Mediastinal contours are distorted related to patient rotation to the right, and chronic postradiation changes resulting in volume loss in the right hemithorax and shift of cardiomediastinal structures to the right. Orthopedic fixation hardware in the lower cervical spine.  IMPRESSION: 1. Overall, the radiographic appearance the chest is very similar to the prior study, as detailed above. No definite acute cardiopulmonary disease is noted.   Electronically Signed   By: Vinnie Langton M.D.   On: 03/28/2013 10:28   Ct Head Wo Contrast  03/28/2013   ADDENDUM REPORT: 03/28/2013 10:47   Electronically Signed   By: Chauncey Cruel M.D.   On: 03/28/2013 10:47   03/28/2013   CLINICAL DATA:  Headache with nausea and vomiting.  Lung cancer.  EXAM: CT HEAD WITHOUT CONTRAST  TECHNIQUE: Contiguous axial images were obtained from the base of the skull through the vertex without intravenous contrast.  COMPARISON:  03/18/2013 and 03/05/2013 unenhanced head CT and contrast-enhanced MR 01/07/2013 and 10/28/2012.  FINDINGS: Right superior parietal lobe 2 cm mass measured 1.5 cm on 03/05/2013.  Right posterior frontal -parietal lobe hyperdense mass measures 2.3 cm and measured 1.5 cm on 03/05/2013.  Anterior right subfrontal 9 mm mass barely perceptible on the 03/05/2013 exam.  Significant vasogenic edema throughout the right parietal and frontal lobe with mass effect upon the lateral ventricle with 8.4 mm midline shift to the left versus 7.9 mm on the 03/18/2013 exam. Mild/early trapping of the left lateral ventricle the may be present. Vasogenic edema extends into the base ganglia. Less notable vasogenic edema surrounding the were anterior right  subfrontal lesion.  The metastatic lesions are hyperdense but without frank hemorrhage.  No acute thrombotic infarct seen separate from the above described findings.  IMPRESSION: Three intracranial metastatic lesions with surrounding vasogenic edema as detailed above. The metastatic lesions have increased in size from 03/05/2013 exam and vasogenic edema with midline shift to the left slightly more prominent than on the 03/18/2013 exam.  Please see above for further detail.  These results were called by telephone at the time of interpretation on 03/28/2013 at 10:36 AM to Dr. Blanchie Dessert , who verbally acknowledged these results.  Electronically Signed: By: Chauncey Cruel M.D. On: 03/28/2013 10:37   Portable Chest 1 View  03/29/2013   CLINICAL DATA:  Infiltrate  EXAM: PORTABLE CHEST - 1 VIEW  COMPARISON:  DG CHEST 2 VIEW dated 03/28/2013; CT CHEST W/CM dated 12/30/2012  FINDINGS: Stable right apical pleural and parenchymal opacity with cicatrization. Chronic linear opacities in the aerated right lung. Left lung is hyper aerated and clear. No pneumothorax. Cardiomegaly. Normal vascularity.  IMPRESSION: Stable right apical pleural and parenchymal opacity.   Electronically Signed   By: Maryclare Bean M.D.   On: 03/29/2013 07:14    Scheduled Meds: . antiseptic oral rinse  15 mL Mouth Rinse BID  . dexamethasone  4 mg Intravenous Q12H  . enoxaparin (LOVENOX) injection  40 mg Subcutaneous Q24H  . ipratropium  500 mcg Nebulization QID  . levETIRAcetam  1,500 mg Oral BID  . pantoprazole (PROTONIX) IV  40 mg Intravenous Daily  . pentoxifylline  400 mg Oral BID WC  . piperacillin-tazobactam (ZOSYN)  IV  3.375 g Intravenous Q8H  . sodium chloride  3 mL Intravenous Q12H   Continuous Infusions: . sodium chloride 75 mL/hr at 03/28/13 1737    Active Problems:   COPD (chronic obstructive pulmonary disease)   Lung cancer with brain metastasis   Seizure   Brain metastases   Vasogenic brain edema   PNA  (pneumonia)    Time spent: [facility] Pager [phone]. If 7PM-7AM, please contact night-coverage at [email or web address], password [facility] 03/29/2013, 12:59 PM  LOS: 1 day

## 2013-03-30 DIAGNOSIS — G40802 Other epilepsy, not intractable, without status epilepticus: Secondary | ICD-10-CM

## 2013-03-30 DIAGNOSIS — C7949 Secondary malignant neoplasm of other parts of nervous system: Principal | ICD-10-CM

## 2013-03-30 DIAGNOSIS — C3490 Malignant neoplasm of unspecified part of unspecified bronchus or lung: Secondary | ICD-10-CM

## 2013-03-30 DIAGNOSIS — D72829 Elevated white blood cell count, unspecified: Secondary | ICD-10-CM

## 2013-03-30 DIAGNOSIS — C7931 Secondary malignant neoplasm of brain: Principal | ICD-10-CM

## 2013-03-30 DIAGNOSIS — R222 Localized swelling, mass and lump, trunk: Secondary | ICD-10-CM

## 2013-03-30 DIAGNOSIS — M6281 Muscle weakness (generalized): Secondary | ICD-10-CM

## 2013-03-30 MED ORDER — SALINE SPRAY 0.65 % NA SOLN
1.0000 | NASAL | Status: DC | PRN
  Filled 2013-03-30: qty 44

## 2013-03-30 MED ORDER — DEXAMETHASONE 4 MG PO TABS: 4.0000 mg | ORAL_TABLET | Freq: Two times a day (BID) | ORAL | Status: AC

## 2013-03-30 NOTE — Evaluation (Signed)
Clinical/Bedside Swallow Evaluation Patient Details  Name: Melanie Liu MRN: [id number] Date of Birth: 12-Jul-1950  Today's Date: 03/30/2013 Time: 6734-1937 SLP Time Calculation (min): 40 min  Past Medical History:  Past Medical History  Diagnosis Date  . Nodule of right lung     right upper lobe  . Anemia   . Rectovaginal fistula   . Right frontal lobe lesion 12/13/11    CT head  . History of radiation therapy 12/24/11-02/08/12    rul 66Gy/28fxs  . Dysphagia   . History of radiation therapy 12/27/11    SRS rt ant frontal 20gy  . Lung cancer 11/23/11    biopsy-right  . Brain metastases   . Chest pain at rest 08/26/2012  . COPD (chronic obstructive pulmonary disease)   . Shortness of breath    Past Surgical History:  Past Surgical History  Procedure Laterality Date  . Partial hysterectomy    . Hemmorhoid surgery    . Neck surgery    . Retinal detachment surgery    . Esophagogastroduodenoscopy N/A 05/01/2012    Procedure: ESOPHAGOGASTRODUODENOSCOPY (EGD);  Surgeon: Melanie Liu;  Location: [facility];  Service: Endoscopy;  Laterality: N/A;  . Subxyphoid pericardial window N/A 08/28/2012    Procedure: SUBXYPHOID PERICARDIAL WINDOW;  Surgeon: Melanie Liu;  Location: [facility] OR;  Service: Thoracic;  Laterality: N/A;  . Tee without cardioversion  08/28/2012    Procedure: TRANSESOPHAGEAL ECHOCARDIOGRAM (TEE);  Surgeon: Melanie Liu;  Location: [place];  Service: Thoracic;;  . Eye surgery    . Tubal ligation    . Mass excision Right 09/26/2012    Procedure: EXCISION MASS;  Surgeon: Melanie Liu;  Location: [facility] OR;  Service: Vascular;  Laterality: Right;  EXCISION OF RIGHT NECK  MASS  . Abdominal hysterectomy  1988    [facility] - Melanie Liu   HPI:  63 yo female adm to [facility] with vasogenic brain edema.  Pt has h/o metastatic lung cancer with brain mets and COPD.  Leukocytosis presumed to be due to steroids as imaging negative for acute findings, pna.  Pt  also has h/o dysphagia due to herpetic ulcer s/p antivirals, GI cocktail, Carafate  04/2012 .  Per RN, pt is tolerating her po intake currently.  Pt underwent pallaitive meeting in January 2015 with no feeding tube desired but full treatment.  Per RN, pt has periods of confusion due to brain mets.  Pt has had 5 admits in six months.  CXR negative for acute finding CT head shows progression of lesions.     Assessment / Plan / Recommendation Clinical Impression  Pt presents with functional oropharyngeal swallow ability, observed pt consuming breakfast - she eats at rapid rate but swallow was timely with no s/s of aspiration.  Rec advance diet to regular/thin, pt admits to resolution of dysphagia from one year ago (tx for herpetic ulcer), pt educated to aspiration precautions using teach back for comprehension.  Rec pt clean mouth after meals due to coating found during oral motor exam, ? crushed pills - coating was cleared with cueing.      Aspiration Risk  Mild    Diet Recommendation Regular;Thin liquid   Liquid Administration via: Straw;Cup Medication Administration: Whole meds with puree (per pt request) Supervision: Patient able to self feed Compensations: Small sips/bites;Check for pocketing Postural Changes and/or Swallow Maneuvers: Seated upright 90 degrees;Upright 30-60 min after meal    Other  Recommendations Oral Care Recommendations:  (oral care  after po)   Follow Up Recommendations  None    Frequency and Duration        Pertinent Vitals/Pain Afebrile, decreased    SLP Swallow Goals   n/a  Swallow Study Prior Functional Status   pt with good intake prior to admission and no dysphagia per pt    General Date of Onset: 03/30/13 HPI: 63 yo female adm to [facility] with vasogenic brain edema.  Pt has h/o metastatic lung cancer with brain mets and COPD.  Leukocytosis presumed to be due to steroids as imaging negative for acute findings, pna.  Pt also has h/o dysphagia due to herpetic  ulcer s/p antivirals, GI cocktail, Carafate  04/2012 .  Per RN, pt is tolerating her po intake currently.  Pt underwent pallaitive meeting in January 2015 with no feeding tube desired but full treatment.  Per RN, pt has periods of confusion due to brain mets.  Pt has had 5 admits in six months.  CXR negative for acute finding CT head shows progression of lesions.   Type of Study: Bedside swallow evaluation Diet Prior to this Study: Dysphagia 2 (chopped);Thin liquids Temperature Spikes Noted: No Respiratory Status: Nasal cannula History of Recent Intubation: No Behavior/Cognition: Alert;Cooperative;Pleasant mood Self-Feeding Abilities: Able to feed self Patient Positioning: Upright in bed Baseline Vocal Quality: Low vocal intensity;Hoarse Volitional Cough: Strong Volitional Swallow: Able to elicit    Oral/Motor/Sensory Function Overall Oral Motor/Sensory Function: Appears within functional limits for tasks assessed (mild amount of whitish coating, ? crushed medicine adhered to tongue surface, cleared with oral care/dental brushing)   Ice Chips Ice chips: Not tested   Thin Liquid Thin Liquid: Within functional limits Presentation: Straw;Self Fed    Nectar Thick Nectar Thick Liquid: Not tested   Honey Thick Honey Thick Liquid: Not tested   Puree Puree: Within functional limits Presentation: Self Fed;Spoon   Solid   GO    Solid: Within functional limits Presentation: [place], [place] [facility] SLP [phone]

## 2013-03-30 NOTE — Progress Notes (Signed)
Melanie Liu  [phone]    HOSPITAL PROGRESS NOTE  DIAGNOSIS: Metastatic non-small cell lung cancer, adenocarcinoma with negative EGFR mutation and negative ALK gene translocation diagnosed in September of 2013 with metastatic single brain lesion.   PRIOR THERAPY:  1) Status post stereotactic radiosurgery to the single brain lesion under the care Dr. Tammi Klippel.  2) Concurrent chemoradiation with chemotherapy in the form of weekly carboplatin for an AUC of 2 and paclitaxel at 45 mg per meter squared concurrent with radiation therapy under the care Dr. Tammi Klippel with partial response in her disease.  3) Systemic chemotherapy with carboplatin for AUC of 5 and Alimta 500 mg/M2 every 3 weeks. From cycle 3 forward she is receiving carboplatin for an AUC of 4 and Alimta at 375 mg per meter squared due to thrombocytopenia. Status post a total of 6 cycles with evidence for disease progression.  4) Subxiphoid pericardial window for drainage of pericardial effusion under the care of Dr. Prescott Gum on 08/28/2012 and the final pathology showed no malignant cells in the pleural fluid.  5) Tarceva 150 mg by mouth daily. Therapy started 09/30/2012. Status post approximately 3 months of therapy.  6) stereotactic biopsy followed by MRI- Thermometry guided laser interstitial Thermal therapy (LITT therapy) at [facility] under the care of Dr. Angelene Giovanni on 01/25/2013.   CURRENT THERAPY: Patient currently being considered for the immunotherapy with Nivolumab clinical trial   CHEMOTHERAPY INTENT: Palliative   HPI: Melanie Liu  was admitted on 03/28/13 following a recent hospitalization from 1/14 till with with seizures as she was tapering her decadron, with possible seizure, nausea and vomiting. Of note, the patient had experienced this seizure the night prior, but after 3 mg of Ativan taken, she had slept through the night and the n/v was present once waking up.She had  generalized weakness. Her Decadron was down to 4 mg a day.No other GI complaints, no GU symptoms.CT of the head with no contrast on 1/24 showed Three intracranial metastatic lesions with surrounding vasogenic edema ( Right superior parietal lobe 2 cm , Right posterior frontal -parietal lobe measures 2.3 cm and  Anterior right subfrontal 9 mm mass)  increased in size from 03/05/2013 exam and vasogenic edema throughout the right parietal and frontal lobe with mass effect upon the lateral ventricle with 8.4 mm midline shift to the left (7.9 on CT of 1/14) Less notable vasogenic edema surrounding the were anterior right subfrontal lesion. The metastatic lesions are hyperdense but without frank hemorrhage. No acute thrombotic infarct seen.she developed respiratory distress with hypoxia in the ED after vomiting but CXR 1/25 showed Stable right apical pleural and parenchymal opacity. Initially She was given IV antibiotics, which were discontinued on 1/25. nebulizers were given. She was placed on Decadron as per Dr. Boyce Medici recommendations . Keppra was continued.     MEDICATIONS:  Scheduled Meds: . antiseptic oral rinse  15 mL Mouth Rinse BID  . dexamethasone  4 mg Intravenous Q12H  . enoxaparin (LOVENOX) injection  40 mg Subcutaneous Q24H  . ipratropium  500 mcg Nebulization QID  . levETIRAcetam  1,500 mg Oral BID  . magic mouthwash  5 mL Oral BID  . pantoprazole (PROTONIX) IV  40 mg Intravenous Daily  . pentoxifylline  400 mg Oral BID WC  . sodium chloride  3 mL Intravenous Q12H   Continuous Infusions: . sodium chloride 75 mL/hr at 03/30/13 1047   PRN Meds:.acetaminophen, acetaminophen, albuterol, ondansetron (ZOFRAN) IV, ondansetron, oxyCODONE-acetaminophen, sodium chloride  ALLERGIES:   Allergies  Allergen Reactions  . Carboplatin Shortness Of Breath  . Levofloxacin Rash    itching     PHYSICAL EXAMINATION:   Filed Vitals:   03/30/13 1057  BP: 100/63  Pulse: 111  Temp: 97.4 F (36.3  C)  Resp: 24   Filed Weights   03/28/13 1500  Weight: 108 lb 11 oz (49.49 kg)    63 year old WF  in no acute distress A, follows simple commands General well-developed and well-nourished  HEENT: Normocephalic, atraumatic, PERRLA. Neck supple. no thyromegaly, no cervical or supraclavicular adenopathy  Lungs clear bilaterally .expiratory on right, decreased breath sounds on the left.  Cardiac: regular rate and rhythm,no murmur , rubs or gallops Abdomen soft nontender , bowel sounds x4. No HSM Extremities no clubbing cyanosis or edema. No bruising or petechial rash Neuro: non focal  LABORATORY/RADIOLOGY DATA:   Recent Labs Lab 03/28/13 1030 03/28/13 1043  WBC 14.0*  --   HGB 12.3 13.3  HCT 37.5 39.0  PLT 250  --   MCV 94.7  --   MCH 31.1  --   MCHC 32.8  --   RDW 15.2  --   LYMPHSABS 1.2  --   MONOABS 0.5  --   EOSABS 0.4  --   BASOSABS 0.0  --     CMP    Recent Labs Lab 03/28/13 1030 03/28/13 1043 03/29/13 0355  NA 135* 135* 140  K 4.0 3.6* 4.4  CL 93* 94* 100  CO2 30  --  27  GLUCOSE 95 104* 137*  BUN $Re'13 13 14  'qTk$ CREATININE 0.70 0.80 0.64  CALCIUM 9.3  --  9.1        Component Value Date/Time   BILITOT 0.2* 03/05/2013 1128   BILITOT <0.20 01/13/2013 1036     Urinalysis    Component Value Date/Time   COLORURINE YELLOW 01/08/2013 2002   APPEARANCEUR CLEAR 01/08/2013 2002   LABSPEC 1.026 01/08/2013 2002   PHURINE 6.5 01/08/2013 2002   GLUCOSEU NEGATIVE 01/08/2013 2002   [place] NEGATIVE 01/08/2013 2002   BILIRUBINUR NEGATIVE 01/08/2013 2002   [place] NEGATIVE 01/08/2013 2002   PROTEINUR NEGATIVE 01/08/2013 2002   UROBILINOGEN 0.2 01/08/2013 2002   NITRITE NEGATIVE 01/08/2013 2002   LEUKOCYTESUR NEGATIVE 01/08/2013 2002    Drugs of Abuse     Component Value Date/Time   LABOPIA NONE DETECTED 01/08/2013 2001   COCAINSCRNUR NONE DETECTED 01/08/2013 2001   LABBENZ NONE DETECTED 01/08/2013 2001   AMPHETMU NONE DETECTED 01/08/2013 2001   THCU POSITIVE*  01/08/2013 2001   LABBARB NONE DETECTED 01/08/2013 2001     Liver Function Tests: No results found for this basename: AST, ALT, ALKPHOS, BILITOT, PROT, ALBUMIN,  in the last 168 hours No results found for this basename: LIPASE, AMYLASE,  in the last 168 hours No results found for this basename: AMMONIA,  in the last 168 hours   Radiology Studies:  Dg Chest 2 View  03/28/2013   CLINICAL DATA:  Unresponsive week patient. Headache. Vomiting. History of lung cancer.  EXAM: CHEST  2 VIEW  COMPARISON:  Chest x-ray 01/08/2013.  FINDINGS: Opacity in the apex of the right hemithorax, compatible with a combination of underlying mass, postradiation changes and chronic right apical loculated pleural effusion, similar to the prior study. Lungs are otherwise well aerated. No left pleural effusion. No evidence of pulmonary edema. Heart size is normal. Mediastinal contours are distorted related to patient rotation to the right, and chronic postradiation changes resulting  in volume loss in the right hemithorax and shift of cardiomediastinal structures to the right. Orthopedic fixation hardware in the lower cervical spine.  IMPRESSION: 1. Overall, the radiographic appearance the chest is very similar to the prior study, as detailed above. No definite acute cardiopulmonary disease is noted.   Electronically Signed   By: Vinnie Langton M.D.   On: 03/28/2013 10:28   Ct Head Wo Contrast  03/28/2013   ADDENDUM REPORT: 03/28/2013 10:47   Electronically Signed   By: Chauncey Cruel M.D.   On: 03/28/2013 10:47   03/28/2013   CLINICAL DATA:  Headache with nausea and vomiting.  Lung cancer.  EXAM: CT HEAD WITHOUT CONTRAST  TECHNIQUE: Contiguous axial images were obtained from the base of the skull through the vertex without intravenous contrast.  COMPARISON:  03/18/2013 and 03/05/2013 unenhanced head CT and contrast-enhanced MR 01/07/2013 and 10/28/2012.  FINDINGS: Right superior parietal lobe 2 cm mass measured 1.5 cm on  03/05/2013.  Right posterior frontal -parietal lobe hyperdense mass measures 2.3 cm and measured 1.5 cm on 03/05/2013.  Anterior right subfrontal 9 mm mass barely perceptible on the 03/05/2013 exam.  Significant vasogenic edema throughout the right parietal and frontal lobe with mass effect upon the lateral ventricle with 8.4 mm midline shift to the left versus 7.9 mm on the 03/18/2013 exam. Mild/early trapping of the left lateral ventricle the may be present. Vasogenic edema extends into the base ganglia. Less notable vasogenic edema surrounding the were anterior right subfrontal lesion.  The metastatic lesions are hyperdense but without frank hemorrhage.  No acute thrombotic infarct seen separate from the above described findings.  IMPRESSION: Three intracranial metastatic lesions with surrounding vasogenic edema as detailed above. The metastatic lesions have increased in size from 03/05/2013 exam and vasogenic edema with midline shift to the left slightly more prominent than on the 03/18/2013 exam.  Please see above for further detail.  These results were called by telephone at the time of interpretation on 03/28/2013 at 10:36 AM to Dr. Blanchie Dessert , who verbally acknowledged these results.  Electronically Signed: By: Chauncey Cruel M.D. On: 03/28/2013 10:37   Ct Head Wo Contrast  03/18/2013   CLINICAL DATA:  Known lung carcinoma ; currently with headache and vertigo  EXAM: CT HEAD WITHOUT CONTRAST  TECHNIQUE: Contiguous axial images were obtained from the base of the skull through the vertex without intravenous contrast.  COMPARISON:  Brain MRI January 07, 2013 and brain CT March 05, 2012  FINDINGS: There remains extensive vasogenic edema throughout the right posterior superior temporal lobe, superior right occipital lobe, and portions of the posterior right frontal and right parietal lobes. There is again noted a focal mass in this area consistent with known of lung carcinoma metastasis measuring 2.0 x  1.7 cm. There is currently midline shift to the left of 1 cm.  No new mass is seen. There is no hemorrhage. There is no subdural or epidural fluid. There is vasogenic edema in the medial frontal lobe slightly inferior to the lateral ventricles. The a mass consistent with metastatic focus is seen on MR in this region, and the degree of edema in this area remain stable compared to 2 weeks prior. No new edema is appreciable compared to 2 weeks prior. Note that there is a focal calcification in the anterior right parietal lobe which is stable.  Bony calvarium appears intact.  The mastoid air cells are clear.  IMPRESSION: Stable vasogenic edema on the right from known metastases. There  is midline shift toward the right which has increased compared to 2 weeks prior. There is no acute infarct or acute hemorrhage. No new metastases identified on this noncontrast enhanced study.   Electronically Signed   By: Lowella Grip M.D.   On: 03/18/2013 16:33   Ct Head Wo Contrast  03/05/2013   CLINICAL DATA:  Seizures, headaches, history of lung cancer  EXAM: CT HEAD WITHOUT CONTRAST  TECHNIQUE: Contiguous axial images were obtained from the base of the skull through the vertex without intravenous contrast.  COMPARISON:  01/08/2013, MR brain 01/07/2013  FINDINGS: There is a 14 mm right posterior parietal lobe mass with severe surrounding vasogenic edema. There is a 15 mm mass in the right parietal lobes more inferiorly adjacent to the right lateral ventricle with severe surrounding vasogenic edema. The previously demonstrated right frontal lobe mass is not well appreciated on the current exam. The this jetting edema effaces the posterior aspect of the right lateral ventricle. There is no hydrocephalus. There is no intracranial hemorrhage. There is no evidence of cortical based acute infarction. There is 4.3 mm of right-to-left midline shift.  The basal cisterns are patent.  Visualized portions of the orbits are unremarkable.  The visualized portions of the paranasal sinuses and mastoid air cells are unremarkable.  The osseous structures are unremarkable.  IMPRESSION: 1. No acute intracranial hemorrhage or cortical infarction. 2. Persistent cerebral metastatic disease with associated severe vasogenic edema similar in appearance to the prior exam of 01/08/2013.   Electronically Signed   By: Kathreen Devoid   On: 03/05/2013 11:34   Portable Chest 1 View  03/29/2013   CLINICAL DATA:  Infiltrate  EXAM: PORTABLE CHEST - 1 VIEW  COMPARISON:  DG CHEST 2 VIEW dated 03/28/2013; CT CHEST W/CM dated 12/30/2012  FINDINGS: Stable right apical pleural and parenchymal opacity with cicatrization. Chronic linear opacities in the aerated right lung. Left lung is hyper aerated and clear. No pneumothorax. Cardiomegaly. Normal vascularity.  IMPRESSION: Stable right apical pleural and parenchymal opacity.   Electronically Signed   By: Maryclare Bean M.D.   On: 03/29/2013 07:14       ASSESSMENT AND PLAN:  1. Metastatic non-small cell lung cancer, adenocarcinoma with negative EGFR mutation and negative ALK gene translocation. S/p chemo radiation. Patient currently being considered for the immunotherapy with Nivolumab clinical trial . Pt admitted on 1/24 with seizures, nausea and vomiting, after tapering off steroids. CT of the head found progression of size of specifically 3  brain mets with vasogenic edema and midline shift. She received IV Decadron and Keppra was continued.    Per chart notes, Admitting team Discussed palliative/hospice consultation while in the hospital and patient declines stating that she met with them during her last hospitalization  2. Resp distress with hypoxia after vomiting, but with NAD per CXR. Pt on O2 at home and in Hospital, nebs and steroids. Empric antibiotics were discontinued.  3. Leukocytosis in the setting of  Steroids, COPD (chronic obstructive pulmonary disease) Monitor 4. DNR. Other medical issues as per admitting  team.  Rondel Jumbo, PA-C 03/30/2013, 11:21 AM  ADDENDUM: Hematology/Oncology Attending: The patient is seen and examined. I agree with the above note. This is a very pleasant 63 years old white female with metastatic non-small cell lung cancer with recurrent brain metastasis status post stereotactic radiotherapy as well as AUTO-LITT therapy at [facility]. She was admitted several times recently with seizure activity and over the weekend with confusion and mental status change.  CT scan of the head showed vasogenic edema. Her dose of Decadron was increased and the patient felt much better. She was seen by the hospice service in [facility] but the patient was not satisfied with the service and she wanted more home care.  I don't think she is a good candidate for any systemic chemotherapy at this point. I strongly advise the patient to continue with palliative care and hospice at this point. She agreed to the current plan. Continue Decadron at 4 mg by mouth twice a day. Thank you so much for taking good care of Melanie Liu, I will continue to follow the patient with you and assist in her management on as-needed basis.

## 2013-03-30 NOTE — Evaluation (Signed)
Physical Therapy Evaluation Patient Details Name: Melanie Liu MRN: [id number] DOB: [birth year] Today's Date: 03/30/2013 Time: 2683-4196 PT Time Calculation (min): 32 min  PT Assessment / Plan / Recommendation History of Present Illness   C Lauro is a 63 y.o. female with history of lung cancer with brain metastases and known vasogenic edema last hospitalized 1/14 with seizures as she was tapering her decadron, as well as history of COPD who presents with above complaints. History is obtained from EDP as well as Family at the bedside some contribution from patient. It is reported that patient had been doing well since her last discharge until yesterday when family reports that she had called and stated that she felt she might have a seizure and so had taken 3 mg of Ativan. Following that she mostly slept and last night she headaches which persisted through this morning and she also developed nausea vomiting and so was brought to the ED. She also was feeling weak -more in her left upper extremity, but also in her legs bilaterally.   In the ED a CT scan of the brain was done that showed 3 intracranial metastatic lesions with surrounding vasogenic edema-and it was noted that the metastatic lesions had increased in size from the 1/1 CT and vasogenic edema with midline shift to the left more prominent than on the 1/14 exam. Oncology was consulted and Dr. Juliann Mule recommended 8 mg IV Decadron x1 followed by 4 IV every 12. While in the ED patient again vomited and following that developed respiratory distress with wheezing on exam-was hypoxic requiring a nonrebreather mask. Patient admits to none productive cough for the past 4 days, denies fevers. EDP discussed end-of-life issues with family and patient's wishes are to be DO NOT RESUSCITATE and family stated that they had suggested palliative care but patient had   Clinical Impression  Pt unsteady with ambulation. Recommend pt have 24 hour caregivers to assist with  safe mobility. Pt reports gfamily stays with her. Pt will benefit from PT to address problems listed.     PT Assessment  Patient needs continued PT services    Follow Up Recommendations  Home health PT    Does the patient have the potential to tolerate intense rehabilitation      Barriers to Discharge        Equipment Recommendations  None recommended by PT    Recommendations for Other Services     Frequency Min 3X/week    Precautions / Restrictions Precautions Precautions: Fall   Pertinent Vitals/Pain HR 110-138 sats >85% 3 l during ambulation.      Mobility  Bed Mobility Overal bed mobility: Needs Assistance Bed Mobility: Supine to Sit;Sit to Supine Supine to sit: Supervision Sit to supine: Supervision Transfers Overall transfer level: Needs assistance Equipment used: Rolling walker (2 wheeled);1 person hand held assist Transfers: Sit to/from Stand Sit to Stand: Mod assist;Min assist General transfer comment: pt uses UE to push self up, decreased control of dexscent. Ambulation/Gait Ambulation/Gait assistance: +2 safety/equipment;Mod assist Ambulation Distance (Feet): 60 Feet Assistive device: Rolling walker (2 wheeled);1 person hand held assist General Gait Details: pt tends to  hold onto bed and door frame when not using the RW. Steady assist from PT without RE. Required PT to guide RW as Pt appeared  Appeared to have L visual fiel deficits and runs RW into wall and doorways.    Exercises     PT Diagnosis: Difficulty walking;Generalized weakness  PT Problem List: Decreased activity tolerance;Decreased balance;Decreased mobility;Decreased  safety awareness;Decreased knowledge of precautions;Decreased knowledge of use of DME;Decreased cognition PT Treatment Interventions: DME instruction;Gait training;Functional mobility training;Therapeutic activities;Therapeutic exercise;Patient/family education     PT Goals(Current goals can be found in the care plan  section) Acute Rehab PT Goals Patient Stated Goal: I want to go home PT Goal Formulation: With patient Time For Goal Achievement: 04/13/13 Potential to Achieve Goals: Good  Visit Information  Last PT Received On: 03/30/13 Assistance Needed: +2 (equipment ) History of Present Illness:  C Savard is a 63 y.o. female with history of lung cancer with brain metastases and known vasogenic edema last hospitalized 1/14 with seizures as she was tapering her decadron, as well as history of COPD who presents with above complaints. History is obtained from EDP as well as Family at the bedside some contribution from patient. It is reported that patient had been doing well since her last discharge until yesterday when family reports that she had called and stated that she felt she might have a seizure and so had taken 3 mg of Ativan. Following that she mostly slept and last night she headaches which persisted through this morning and she also developed nausea vomiting and so was brought to the ED. She also was feeling weak -more in her left upper extremity, but also in her legs bilaterally. Patient also reported that she had been tapering her Decadron and was down to 4 mg a day(from 8mg /day). She denies abdominal pain, diarrhea, dysuria. In the ED a CT scan of the brain was done that showed 3 intracranial metastatic lesions with surrounding vasogenic edema-and it was noted that the metastatic lesions had increased in size from the 1/1 CT and vasogenic edema with midline shift to the left more prominent than on the 1/14 exam. Oncology was consulted and Dr. Juliann Mule recommended 8 mg IV Decadron x1 followed by 4 IV every 12. While in the ED patient again vomited and following that developed respiratory distress with wheezing on exam-was hypoxic requiring a nonrebreather mask. Patient admits to none productive cough for the past 4 days, denies fevers. EDP discussed end-of-life issues with family and patient's wishes are to be  DO NOT RESUSCITATE and family stated that they had suggested palliative care but patient had        Prior [place] expects to be discharged to:: Private residence Living Arrangements: Alone Available Help at Discharge: Available 24 hours/day;Available PRN/intermittently;Family Type of Home: House Home Access: Ramped entrance Home Layout: One level Home Equipment: [place] - 2 wheels;Shower seat Prior Function Level of Independence: Needs assistance Gait / Transfers Assistance Needed: pt stataes she holds onto objects. Communication Communication: No difficulties    Cognition  Cognition Arousal/Alertness: Awake/alert Behavior During Therapy: WFL for tasks assessed/performed Overall Cognitive Status: No family/caregiver present to determine baseline cognitive functioning Area of Impairment: Safety/judgement;Awareness Safety/Judgement: Decreased awareness of safety    Extremity/Trunk Assessment Upper Extremity Assessment Upper Extremity Assessment: Defer to OT evaluation Lower Extremity Assessment Lower Extremity Assessment: Generalized weakness Cervical / Trunk Assessment Cervical / Trunk Assessment: Normal   Balance Balance Overall balance assessment: Needs assistance Sitting-balance support: No upper extremity supported Sitting balance-Leahy Scale: Fair Sitting balance - Comments: sits without support Standing balance support: No upper extremity supported Standing balance-Leahy Scale: Poor Standing balance comment: tends to lean to the L.  End of Session PT - End of Session Activity Tolerance: Patient tolerated treatment well Patient left: in bed;with call bell/phone within reach;with bed alarm set Nurse Communication: Mobility status, increased  HR  GP     Claretha Cooper 03/30/2013, 10:18 AM Tresa Endo PT [phone]

## 2013-03-30 NOTE — Progress Notes (Signed)
Discharge instructions reviewed with patient using teach back method and she demonstrated understanding.  Patient understands follow up and when to notify the MD.  No seizure activity noted this shift.  Patient stable for discharge.  Assessment unchanged.  Zandra Abts [facility]  03/30/2013  5:03 PM

## 2013-03-30 NOTE — Discharge Summary (Signed)
Physician Discharge Summary  Melanie Liu HAL:[id number] DOB: March 19, 1950 DOA: 03/28/2013  PCP: Melanie Mustache, MD  Admit date: 03/28/2013 Discharge date: 03/30/2013  Time spent: >30 minutes  Recommendations for Outpatient Follow-up:  Follow-up Information   Follow up with Melanie Mustache, MD. (IN 1-2weeks,call for appointment upon this she)    Specialty:  Family Medicine   Contact information:   [address] [place] [address] [phone]       Follow up with Melanie Liu., MD. (call for appt upon discharge)    Specialty:  Oncology   Contact information:   [place] [address] [phone]        Discharge Diagnoses:  Active Problems:   COPD (chronic obstructive pulmonary disease)   Lung cancer with brain metastasis   Seizure   Brain metastases   Vasogenic brain edema   PNA (pneumonia)   Discharge Condition: Stable.  Diet recommendation: Regular  Filed Weights   03/28/13 1500  Weight: 49.3 kg (108 lb 11 oz)    History of present illness:  Pt is a 63 y.o. female with history of lung cancer with brain metastases and known vasogenic edema last hospitalized 1/14 with seizures as she was tapering her decadron, as well as history of COPD who presents with above complaints. History is obtained from Melanie Liu as well as Family at the bedside some contribution from patient. It is reported that patient had been doing well since her last discharge until yesterday when family reports that she had called and stated that she felt she might have a seizure and so had taken 3 mg of Ativan. Following that she mostly slept and last night she headaches which persisted through this morning and she also developed nausea vomiting and so was brought to the ED. She also was feeling weak -more in her left upper extremity, but also in her legs bilaterally. Patient also reported that she had been tapering her Decadron and was down to 4 mg a day(from 55m/day). She  denies abdominal pain, diarrhea, dysuria. In the ED a CT scan of the brain was done that showed 3 intracranial metastatic lesions with surrounding vasogenic edema-and it was noted that the metastatic lesions had increased in size from the 1/1 CT and vasogenic edema with midline shift to the left more prominent than on the 1/14 exam. Oncology was consulted and Melanie Liu 8 mg IV Decadron x1 followed by 4 IV every 12. While in the ED patient again vomited and following that developed respiratory distress with wheezing on exam-was hypoxic requiring a nonrebreather mask. Patient admits to none productive cough for the past 4 days, denies fevers. Melanie Liu discussed end-of-life issues with family and patient's wishes are to be DO NOT RESUSCITATE and family stated that they had suggested palliative care but patient had not decided on it yet. She was admitted for further evaluation and management   Hospital Course:  Lung cancer with brain metastasis Vasogenic brain edema  -As discussed above upon admission oncology(Melanie Liu) was consulted and upon review of the CT scan Decadron 8 mg IV was recommended, and to continue Decadron 417mIV every 12 hours and this was done -added to Dr. MoJulien Nordmannn epic consult List  -On followup Patient clinically improved, alert and lucid is no further headaches reported - PT OT was consulted saw patient was hospital and home health services recommended and have been set up by case manager. -Discussed palliative/hospice consultation while in the hospital and patient declines stating that  she met with them during her last hospitalization  -Discussed patient with Melanie Liu today and he recommends to continue 4 mg of Decadron by mouth twice a day on discharge and patient is to follow up outpatient. Melanie Liu states that he has no further chemotherapy/treatments to offer patient and recommended hospice/palliative care but as noted above patient has declined this consultation at  this time  Resp distress with hypoxia  -As above she developed respiratory distress with hypoxia in the ED after vomiting(above chest x-ray was done prior to this episode)  -Followup chest x-ray this morning shows no acute infiltrates with stable right apical pleural and parenchymal opacity and patient oxygenating well on 2 L nasal cannula which she states she is on at home as well  -Empiric antibiotics with discontinued and she has continued to do well. She is to continue her bronchodilators on discharge. COPD (chronic obstructive pulmonary disease)  -Continue nebulized bronchodilators  -Clinically improved, continue oxygen at home as previously Leukocytosis  -Likely secondary to steroids, as noted above no evidence of acute infiltrates/pna on repeat x-ray  H/o Seizure  -No seizures reported this hospital stay -Continue Keppra   Procedures:  none  Consultations:  Oncology  Discharge Exam: Filed Vitals:   03/30/13 1330  BP: 110/69  Pulse: 98  Temp: 97.5 F (36.4 C)  Resp: 20    Exam:  General: alert & oriented x 3 In NAD  Cardiovascular: RRR, nl S1 s2  Respiratory: CTAB  Abdomen: soft +BS NT/ND, no masses palpable  Extremities: No cyanosis and no edema  Neuro: alert &Oriented x3 strength-Right extremity and lower extremities 4+/5, and left upper extremity 4/5   Discharge Instructions  Discharge Orders   Future Appointments Provider Department Dept Phone   04/07/2013 10:15 AM [facility] [phone]   04/07/2013 10:30 AM Melanie Bears, MD [facility] [phone]   04/20/2013 9:15 AM Melanie Paula, MD [facility] [phone]   Future Orders Complete By Expires   Diet general  As directed    Increase activity slowly  As directed        Medication List         albuterol (5 MG/ML) 0.5% nebulizer solution  Commonly known as:  PROVENTIL  Take 2.5 mg  by nebulization every 6 (six) hours as needed for wheezing.     dexamethasone 4 MG tablet  Commonly known as:  DECADRON  Take 1 tablet (4 mg total) by mouth 2 (two) times daily with a meal.     ipratropium 0.02 % nebulizer solution  Commonly known as:  ATROVENT  Take 500 mcg by nebulization every 4 (four) hours as needed for wheezing or shortness of breath.     levETIRAcetam 1000 MG tablet  Commonly known as:  KEPPRA  Take 1.5 tablets (1,500 mg total) by mouth 2 (two) times daily.     LORazepam 1 MG tablet  Commonly known as:  ATIVAN  Place 1 tablet (1 mg total) under the tongue every 8 (eight) hours as needed for anxiety or seizure.     oxyCODONE-acetaminophen 5-325 MG per tablet  Commonly known as:  PERCOCET/ROXICET  Take 1 tablet by mouth every 4 (four) hours as needed for moderate pain or severe pain.     pentoxifylline 400 MG CR tablet  Commonly known as:  TRENTAL  Take 1 tablet (400 mg total) by mouth 2 (two) times daily with a meal.  prochlorperazine 10 MG tablet  Commonly known as:  COMPAZINE  Take 10 mg by mouth every 6 (six) hours as needed (nausea).     promethazine 25 MG tablet  Commonly known as:  PHENERGAN  Take 1 tablet (25 mg total) by mouth every 6 (six) hours as needed for nausea.     ranitidine 150 MG capsule  Commonly known as:  ZANTAC  Take 150 mg by mouth 2 (two) times daily.     vitamin E 400 UNIT capsule  Commonly known as:  vitamin E  Take 1 capsule (400 Units total) by mouth 2 (two) times daily.       Allergies  Allergen Reactions  . Carboplatin Shortness Of Breath  . Levofloxacin Rash    itching       Follow-up Information   Follow up with Melanie Mustache, MD. (IN 1-2weeks,call for appointment upon this she)    Specialty:  Family Medicine   Contact information:   [place] [address] [phone]       Follow up with Melanie Liu., MD. (call for appt upon discharge)    Specialty:  Oncology   Contact  information:   [place] [address] [phone]        The results of significant diagnostics from this hospitalization (including imaging, microbiology, ancillary and laboratory) are listed below for reference.    Significant Diagnostic Studies: Dg Chest 2 View  03/28/2013   CLINICAL DATA:  Unresponsive week patient. Headache. Vomiting. History of lung cancer.  EXAM: CHEST  2 VIEW  COMPARISON:  Chest x-ray 01/08/2013.  FINDINGS: Opacity in the apex of the right hemithorax, compatible with a combination of underlying mass, postradiation changes and chronic right apical loculated pleural effusion, similar to the prior study. Lungs are otherwise well aerated. No left pleural effusion. No evidence of pulmonary edema. Heart size is normal. Mediastinal contours are distorted related to patient rotation to the right, and chronic postradiation changes resulting in volume loss in the right hemithorax and shift of cardiomediastinal structures to the right. Orthopedic fixation hardware in the lower cervical spine.  IMPRESSION: 1. Overall, the radiographic appearance the chest is very similar to the prior study, as detailed above. No definite acute cardiopulmonary disease is noted.   Electronically Signed   By: Vinnie Langton M.D.   On: 03/28/2013 10:28   Ct Head Wo Contrast  03/28/2013   ADDENDUM REPORT: 03/28/2013 10:47   Electronically Signed   By: Chauncey Cruel M.D.   On: 03/28/2013 10:47   03/28/2013   CLINICAL DATA:  Headache with nausea and vomiting.  Lung cancer.  EXAM: CT HEAD WITHOUT CONTRAST  TECHNIQUE: Contiguous axial images were obtained from the base of the skull through the vertex without intravenous contrast.  COMPARISON:  03/18/2013 and 03/05/2013 unenhanced head CT and contrast-enhanced MR 01/07/2013 and 10/28/2012.  FINDINGS: Right superior parietal lobe 2 cm mass measured 1.5 cm on 03/05/2013.  Right posterior frontal -parietal lobe hyperdense mass measures 2.3 cm and  measured 1.5 cm on 03/05/2013.  Anterior right subfrontal 9 mm mass barely perceptible on the 03/05/2013 exam.  Significant vasogenic edema throughout the right parietal and frontal lobe with mass effect upon the lateral ventricle with 8.4 mm midline shift to the left versus 7.9 mm on the 03/18/2013 exam. Mild/early trapping of the left lateral ventricle the may be present. Vasogenic edema extends into the base ganglia. Less notable vasogenic edema surrounding the were anterior right subfrontal lesion.  The metastatic lesions are hyperdense but without frank hemorrhage.  No acute thrombotic infarct seen separate from the above described findings.  IMPRESSION: Three intracranial metastatic lesions with surrounding vasogenic edema as detailed above. The metastatic lesions have increased in size from 03/05/2013 exam and vasogenic edema with midline shift to the left slightly more prominent than on the 03/18/2013 exam.  Please see above for further detail.  These results were called by telephone at the time of interpretation on 03/28/2013 at 10:36 AM to Dr. Blanchie Dessert , who verbally acknowledged these results.  Electronically Signed: By: Chauncey Cruel M.D. On: 03/28/2013 10:37   Ct Head Wo Contrast  03/18/2013   CLINICAL DATA:  Known lung carcinoma ; currently with headache and vertigo  EXAM: CT HEAD WITHOUT CONTRAST  TECHNIQUE: Contiguous axial images were obtained from the base of the skull through the vertex without intravenous contrast.  COMPARISON:  Brain MRI January 07, 2013 and brain CT March 05, 2012  FINDINGS: There remains extensive vasogenic edema throughout the right posterior superior temporal lobe, superior right occipital lobe, and portions of the posterior right frontal and right parietal lobes. There is again noted a focal mass in this area consistent with known of lung carcinoma metastasis measuring 2.0 x 1.7 cm. There is currently midline shift to the left of 1 cm.  No new mass is seen. There  is no hemorrhage. There is no subdural or epidural fluid. There is vasogenic edema in the medial frontal lobe slightly inferior to the lateral ventricles. The a mass consistent with metastatic focus is seen on MR in this region, and the degree of edema in this area remain stable compared to 2 weeks prior. No new edema is appreciable compared to 2 weeks prior. Note that there is a focal calcification in the anterior right parietal lobe which is stable.  Bony calvarium appears intact.  The mastoid air cells are clear.  IMPRESSION: Stable vasogenic edema on the right from known metastases. There is midline shift toward the right which has increased compared to 2 weeks prior. There is no acute infarct or acute hemorrhage. No new metastases identified on this noncontrast enhanced study.   Electronically Signed   By: Lowella Grip M.D.   On: 03/18/2013 16:33   Ct Head Wo Contrast  03/05/2013   CLINICAL DATA:  Seizures, headaches, history of lung cancer  EXAM: CT HEAD WITHOUT CONTRAST  TECHNIQUE: Contiguous axial images were obtained from the base of the skull through the vertex without intravenous contrast.  COMPARISON:  01/08/2013, MR brain 01/07/2013  FINDINGS: There is a 14 mm right posterior parietal lobe mass with severe surrounding vasogenic edema. There is a 15 mm mass in the right parietal lobes more inferiorly adjacent to the right lateral ventricle with severe surrounding vasogenic edema. The previously demonstrated right frontal lobe mass is not well appreciated on the current exam. The this jetting edema effaces the posterior aspect of the right lateral ventricle. There is no hydrocephalus. There is no intracranial hemorrhage. There is no evidence of cortical based acute infarction. There is 4.3 mm of right-to-left midline shift.  The basal cisterns are patent.  Visualized portions of the orbits are unremarkable. The visualized portions of the paranasal sinuses and mastoid air cells are unremarkable.   The osseous structures are unremarkable.  IMPRESSION: 1. No acute intracranial hemorrhage or cortical infarction. 2. Persistent cerebral metastatic disease with associated severe vasogenic edema similar in appearance to the prior exam of 01/08/2013.   Electronically  Signed   By: Kathreen Devoid   On: 03/05/2013 11:34   Portable Chest 1 View  03/29/2013   CLINICAL DATA:  Infiltrate  EXAM: PORTABLE CHEST - 1 VIEW  COMPARISON:  DG CHEST 2 VIEW dated 03/28/2013; CT CHEST W/CM dated 12/30/2012  FINDINGS: Stable right apical pleural and parenchymal opacity with cicatrization. Chronic linear opacities in the aerated right lung. Left lung is hyper aerated and clear. No pneumothorax. Cardiomegaly. Normal vascularity.  IMPRESSION: Stable right apical pleural and parenchymal opacity.   Electronically Signed   By: Maryclare Bean M.D.   On: 03/29/2013 07:14    Microbiology: Recent Results (from the past 240 hour(s))  MRSA PCR SCREENING     Status: None   Collection Time    03/28/13  3:11 PM      Result Value Range Status   MRSA by PCR NEGATIVE  NEGATIVE Final   Comment:            The GeneXpert MRSA Assay (FDA     approved for NASAL specimens     only), is one component of a     comprehensive MRSA colonization     surveillance program. It is not     intended to diagnose MRSA     infection nor to guide or     monitor treatment for     MRSA infections.     Labs: Basic Metabolic Panel:  Recent Labs Lab 03/28/13 1030 03/28/13 1043 03/29/13 0355  NA 135* 135* 140  K 4.0 3.6* 4.4  CL 93* 94* 100  CO2 30  --  27  GLUCOSE 95 104* 137*  BUN _0 CREATININE 0.70 0.80 0.64  CALCIUM 9.3  --  9.1   Liver Function Tests: No results found for this basename: AST, ALT, ALKPHOS, BILITOT, PROT, ALBUMIN,  in the last 168 hours No results found for this basename: LIPASE, AMYLASE,  in the last 168 hours No results found for this basename: AMMONIA,  in the last 168 hours CBC:  Recent Labs Lab 03/28/13 1030  03/28/13 1043  WBC 14.0*  --   NEUTROABS 11.9*  --   HGB 12.3 13.3  HCT 37.5 39.0  MCV 94.7  --   PLT 250  --    Cardiac Enzymes: No results found for this basename: CKTOTAL, CKMB, CKMBINDEX, TROPONINI,  in the last 168 hours BNP: BNP (last 3 results)  Recent Labs  08/27/12 0200  PROBNP 635.4*   CBG: No results found for this basename: GLUCAP,  in the last 168 hours     Signed:  Joelle Flessner C  [facility] 03/30/2013, 4:45 PM

## 2013-03-30 NOTE — Progress Notes (Signed)
Patient's oxygen level on 2L [place] at rest was 95%.  On room air at rest was 93%.  Oxygen level on room air with ambulation in the hall only decreased to 91%.  Patient without any shortness of breath or other signs of respiratory distress.  Mechele Claude, RN Case Manager was notified.  Melanie Liu [facility]  03/30/2013  1:33 PM

## 2013-03-30 NOTE — Progress Notes (Signed)
[facility] is providing the following services: Oxygen  If patient discharges after hours, please call [phone].   Linward Headland 03/30/2013, 1:03 PM

## 2013-03-30 NOTE — Care Management Note (Addendum)
Cm spoke with patient at bedside concerning discharge planning. Per pt resides home alone. Pt consulted by Palliative Care during hospital admission, per pt declined home hospice or palliative care at this time. PT recommends HHPT with 24 hour supervision. Pt's insurance will not permit [facility] services but will permit RN/SW/Aide. Pt offered choice for [facility]. Per pt choice [facility] to provide [facility] services. Per [facility] rep Lurlean Leyden start of care scheduled 03/30/13. Per pt's permission CM spoke with pt's sister Pandora Leiter at [phone]. Per pt's sister family to arrange 24 care upon discharge. CM to provide family with private duty care list. MD consulted to order SW to continue to support pt upon discharge for home needs. Pt currently on 2L n/c. MD order for home oxygen. Per RN during ambulation pt oxygen saturation did not drop below 91%. Pt does not require continuous home oxygen, per pt has concentrator for night time use only.        [facility] Millette Halberstam,MSN,RN [phone]

## 2013-04-02 ENCOUNTER — Other Ambulatory Visit: Payer: Self-pay | Admitting: *Deleted

## 2013-04-02 ENCOUNTER — Other Ambulatory Visit: Payer: Self-pay | Admitting: Radiation Therapy

## 2013-04-02 DIAGNOSIS — C3490 Malignant neoplasm of unspecified part of unspecified bronchus or lung: Secondary | ICD-10-CM

## 2013-04-02 DIAGNOSIS — C7931 Secondary malignant neoplasm of brain: Secondary | ICD-10-CM

## 2013-04-02 DIAGNOSIS — C7949 Secondary malignant neoplasm of other parts of nervous system: Principal | ICD-10-CM

## 2013-04-02 NOTE — Progress Notes (Signed)
Received call from Radford at [facility], she states that pt is needing an order for oxygen 2 L at night.  Per Dr Vista Mink okay to send order.  Order sent through [facility].  SLJ

## 2013-04-03 ENCOUNTER — Ambulatory Visit
Admission: RE | Admit: 2013-04-03 | Discharge: 2013-04-03 | Disposition: A | Discharge status: Home / Self Care | Payer: Medicaid Other | Source: Ambulatory Visit | Attending: Radiation Oncology | Admitting: Radiation Oncology

## 2013-04-03 DIAGNOSIS — C7949 Secondary malignant neoplasm of other parts of nervous system: Principal | ICD-10-CM

## 2013-04-03 DIAGNOSIS — C7931 Secondary malignant neoplasm of brain: Secondary | ICD-10-CM

## 2013-04-03 MED ORDER — GADOBENATE DIMEGLUMINE 529 MG/ML IV SOLN
10.0000 mL | Freq: Once | INTRAVENOUS | Status: AC | PRN
  Administered 2013-04-03: 10 mL via INTRAVENOUS

## 2013-04-06 ENCOUNTER — Inpatient Hospital Stay: Admission: RE | Admit: 2013-04-06 | Payer: Medicaid Other | Source: Ambulatory Visit

## 2013-04-07 ENCOUNTER — Ambulatory Visit: Payer: Medicaid Other | Admitting: Internal Medicine

## 2013-04-07 ENCOUNTER — Other Ambulatory Visit: Payer: Medicaid Other

## 2013-04-20 ENCOUNTER — Ambulatory Visit: Payer: Medicaid Other | Admitting: Radiation Oncology

## 2013-04-24 ENCOUNTER — Encounter: Payer: Self-pay | Admitting: Internal Medicine

## 2013-04-24 NOTE — Progress Notes (Signed)
Put disability form on nurse's desk. °

## 2013-04-28 ENCOUNTER — Encounter: Payer: Self-pay | Admitting: Internal Medicine

## 2013-04-28 NOTE — Progress Notes (Signed)
Faxed disability form to [phone]

## 2013-08-04 ENCOUNTER — Encounter (HOSPITAL_COMMUNITY): Payer: Self-pay

## 2013-10-03 DEATH — deceased

## 2014-01-07 IMAGING — CT CT ABD-PELV W/ CM
2 of 4 series · 16 of 46 positions shown, 18 images · IV contrast (OMNIPAQUE)
Comparison: Chest CT 08/27/2012. Abdominal pelvic CT most recent
08/22/2012.

CLINICAL DATA: Right-sided lung cancer. Restaging of metastatic
non-small-cell lung cancer. Radiation therapy finishing [DATE]. Brain
metastasis.

EXAM:
CT CHEST, ABDOMEN, AND PELVIS WITH CONTRAST
TECHNIQUE: Multidetector CT imaging of the chest, abdomen and pelvis was
performed following the standard protocol during bolus
administration of intravenous contrast.
CONTRAST:  80mL OMNIPAQUE IOHEXOL 300 MG/ML  SOLN

[Series 2: cap with st · axial · 0.65mm/px · z∈[+738,+1293]mm · 13 of 121 slices shown, 15 images]
[im 5/121  soft-tissue]
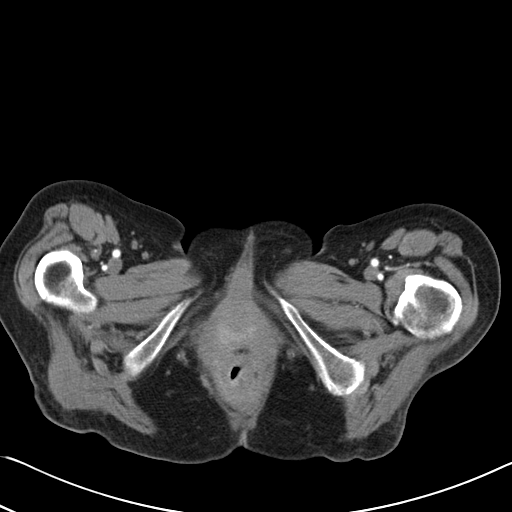
[im 5/121  bone]
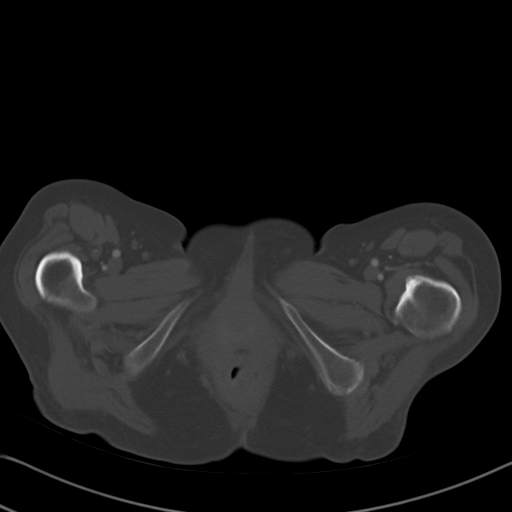
[im 15/121  soft-tissue]
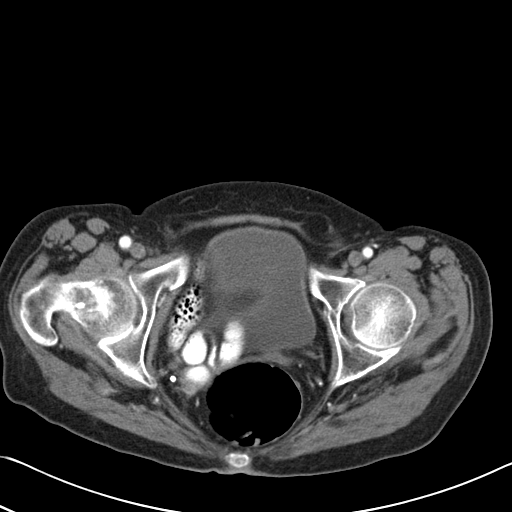
[im 25/121  soft-tissue]
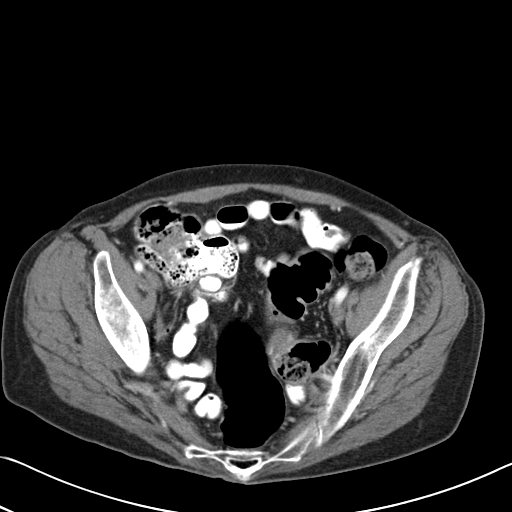
[im 34/121  soft-tissue]
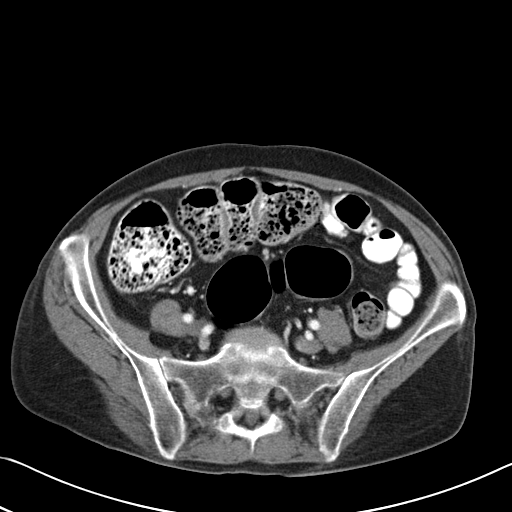
[im 44/121  soft-tissue]
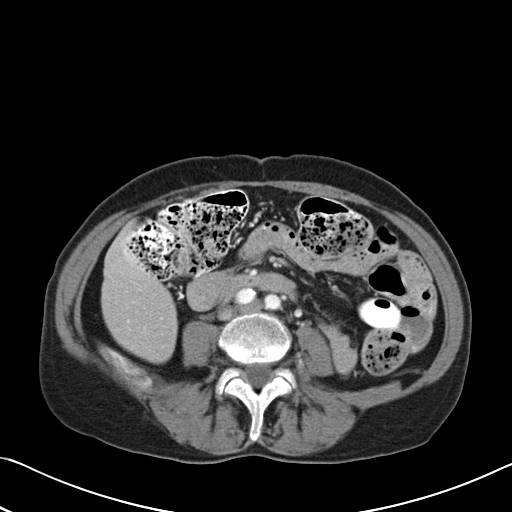
[im 53/121  soft-tissue]
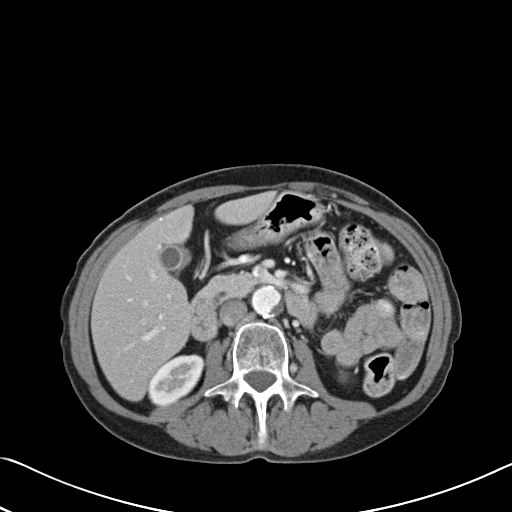
[im 63/121  soft-tissue]
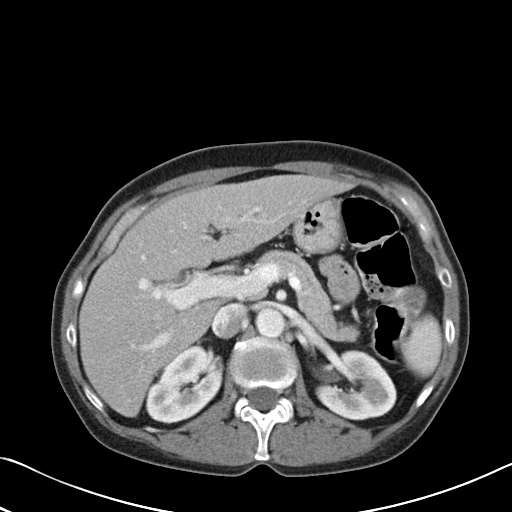
[im 68/121  soft-tissue]
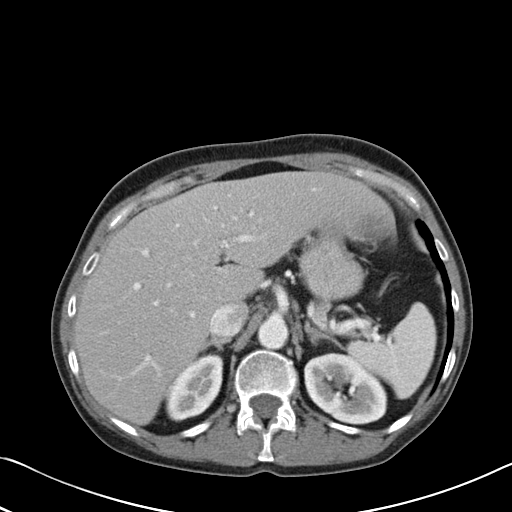
[im 77/121  soft-tissue]
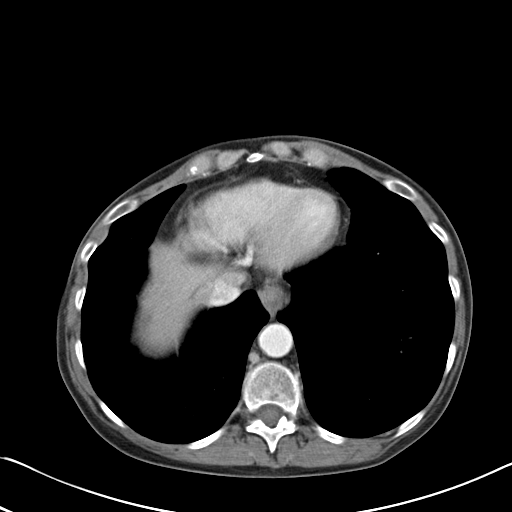
[im 77/121  bone]
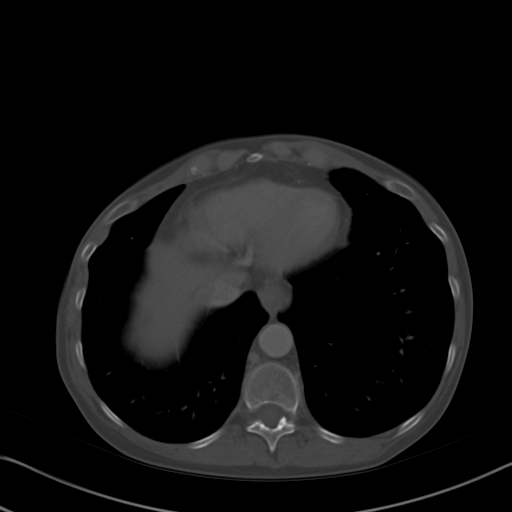
[im 87/121  soft-tissue]
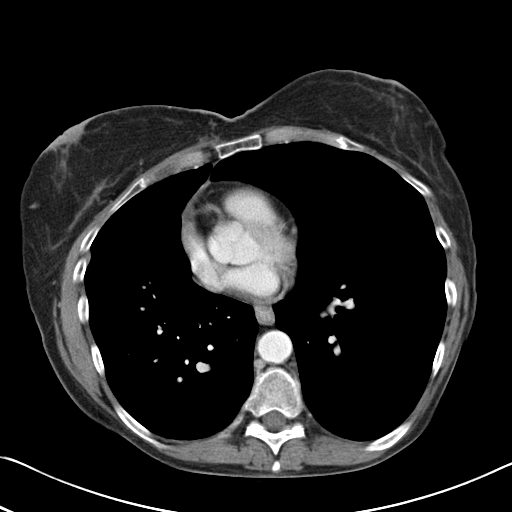
[im 97/121  soft-tissue]
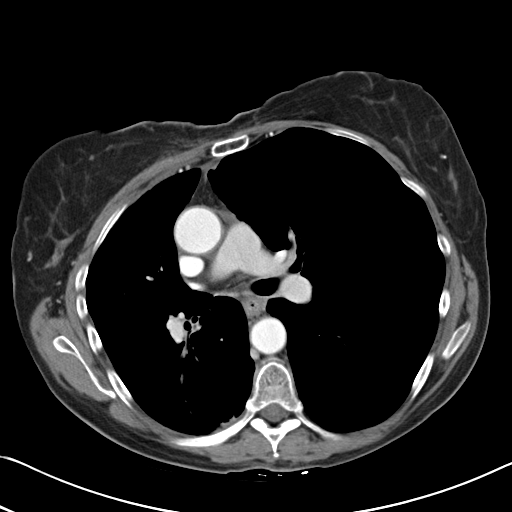
[im 106/121  soft-tissue]
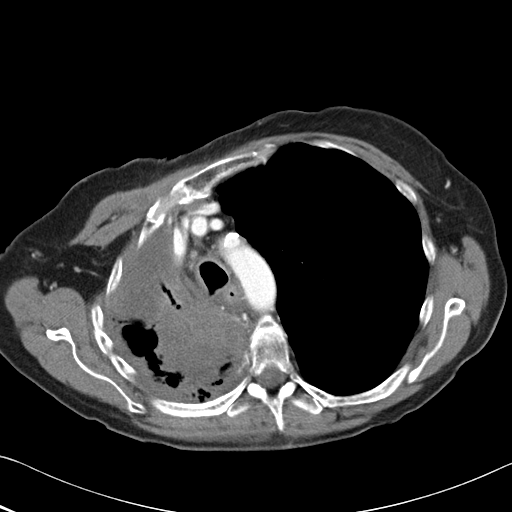
[im 116/121  soft-tissue]
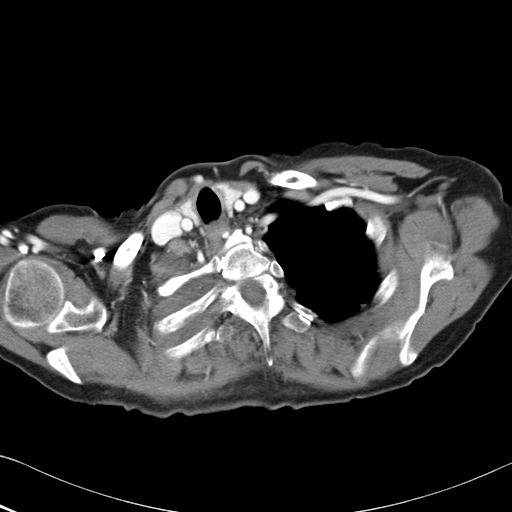

[Series 602: <mpr thick range> · coronal · 1.17mm/px · 3 of 90 slices shown]
[im 30/90  soft-tissue]
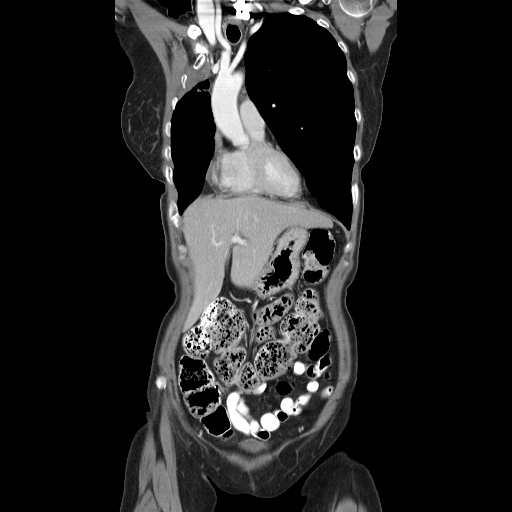
[im 40/90  soft-tissue]
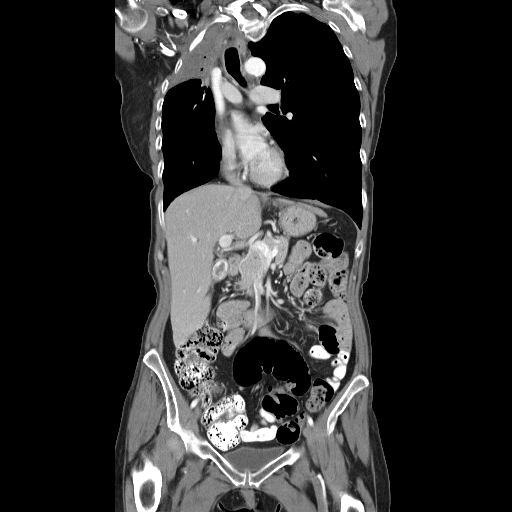
[im 50/90  soft-tissue]
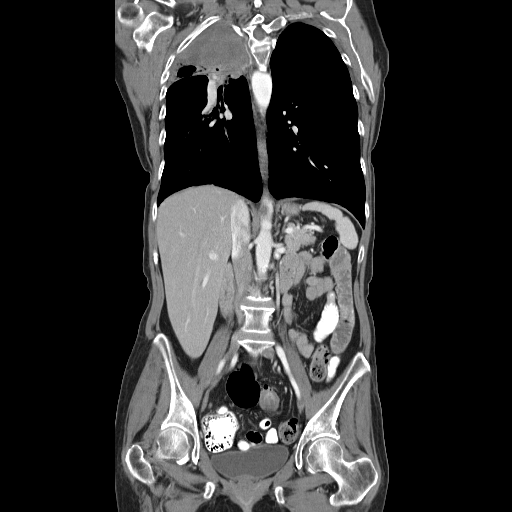

[16 of 46 positions shown; findings below may reference images not displayed]

FINDINGS: CT CHEST FINDINGS

Lungs/Pleura: Moderate centrilobular emphysema.

Marked enlargement of a right lower lobe subpleural nodule. This
measures 1.3 cm on image 31 versus 4 mm on the prior exam.

Posterior right upper lobe lung mass is difficult to differentiate
from surrounding airspace disease. On the order of 5.5 x 5.9 cm on
image 13/series 5. Similar to 6.1 x 6.1 cm at the same level on the
prior exam. Surrounding airspace disease which is minimally
increased and likely due to a combination of infection and
postobstructive pneumonitis.

Loculated circumferential right-sided pleural fluid suspected and
not significantly changed. No left-sided pleural effusion

Heart/Mediastinum: No supraclavicular adenopathy. Normal heart size.
Resolution of previously described large pericardial effusion. No
central pulmonary embolism, on this non-dedicated study. No
mediastinal adenopathy.

CT ABDOMEN AND PELVIS FINDINGS

Abdomen/Pelvis: An inferior right liver lobe lesion measures 4 mm on
image 73/ series 2 and is less conspicuous than 7 mm on the prior
exam. Low-density adjacent to falciform ligament is also less
conspicuous today and measures 7 mm on image 64. Hepatomegaly, with
liver measuring 20.6 cm craniocaudal. No new liver lesions are seen.
Normal spleen, stomach, pancreas. Gallstones without acute
cholecystitis or biliary ductal dilatation. Normal adrenal glands
and right kidney. Too small to characterize left renal lesion.

Advanced aortic atherosclerosis. No retroperitoneal or retrocrural
adenopathy. Normal colon and terminal ileum. Normal small bowel
without abdominal ascites. Question surgical changes in the right
common iliac artery. No pelvic adenopathy. Tiny fat containing left
inguinal hernia. Hysterectomy. Normal urinary bladder. No adnexal
mass or significant free fluid.

Mild osteopenia. Lower cervical spine fixation. Disc bulges at L4-5
and L5-S1.
IMPRESSION: CT CHEST IMPRESSION

1. Interval enlargement of a right lower lobe lung nodule,
consistent with metastatic disease.
2. Similar right upper lobe lung mass with slight increase in
surrounding airspace disease. Infection and/or postobstructive
pneumonitis.
3. Interval resolution of pericardial effusion.

CT ABDOMEN AND PELVIS IMPRESSION

1. Apparent slight decrease conspicuity of 2 liver lesions. The more
inferior is favored to represent a tiny cyst and was readily
apparent on 11/14/2011 PET. The left liver lesion is suspicious for
focal steatosis and warrants followup attention.
2. No new or progressive disease within the abdomen/ pelvis.
3. Cholelithiasis.
